# Patient Record
Sex: Female | Born: 1975 | Race: Black or African American | Hispanic: No | Marital: Married | Smoking: Never smoker
Health system: Southern US, Community
[De-identification: ages and names within clinical notes are randomized; demographics above are authoritative.]

## PROBLEM LIST (undated history)

## (undated) DIAGNOSIS — C801 Malignant (primary) neoplasm, unspecified: Secondary | ICD-10-CM

## (undated) DIAGNOSIS — Z9289 Personal history of other medical treatment: Secondary | ICD-10-CM

## (undated) DIAGNOSIS — G8929 Other chronic pain: Secondary | ICD-10-CM

## (undated) DIAGNOSIS — K5090 Crohn's disease, unspecified, without complications: Secondary | ICD-10-CM

## (undated) DIAGNOSIS — E119 Type 2 diabetes mellitus without complications: Secondary | ICD-10-CM

## (undated) DIAGNOSIS — D649 Anemia, unspecified: Secondary | ICD-10-CM

## (undated) DIAGNOSIS — E785 Hyperlipidemia, unspecified: Secondary | ICD-10-CM

## (undated) DIAGNOSIS — E039 Hypothyroidism, unspecified: Secondary | ICD-10-CM

## (undated) DIAGNOSIS — L509 Urticaria, unspecified: Secondary | ICD-10-CM

## (undated) DIAGNOSIS — F419 Anxiety disorder, unspecified: Secondary | ICD-10-CM

## (undated) DIAGNOSIS — T8859XA Other complications of anesthesia, initial encounter: Secondary | ICD-10-CM

## (undated) DIAGNOSIS — D329 Benign neoplasm of meninges, unspecified: Secondary | ICD-10-CM

## (undated) DIAGNOSIS — R112 Nausea with vomiting, unspecified: Secondary | ICD-10-CM

## (undated) DIAGNOSIS — Z9889 Other specified postprocedural states: Secondary | ICD-10-CM

## (undated) DIAGNOSIS — T4145XA Adverse effect of unspecified anesthetic, initial encounter: Secondary | ICD-10-CM

## (undated) DIAGNOSIS — N289 Disorder of kidney and ureter, unspecified: Secondary | ICD-10-CM

## (undated) DIAGNOSIS — T148XXA Other injury of unspecified body region, initial encounter: Secondary | ICD-10-CM

## (undated) DIAGNOSIS — J45909 Unspecified asthma, uncomplicated: Secondary | ICD-10-CM

## (undated) DIAGNOSIS — F410 Panic disorder [episodic paroxysmal anxiety] without agoraphobia: Secondary | ICD-10-CM

## (undated) HISTORY — PX: BREAST SURGERY: SHX581

## (undated) HISTORY — DX: Urticaria, unspecified: L50.9

## (undated) HISTORY — PX: NEPHRECTOMY TRANSPLANTED ORGAN: SUR880

## (undated) HISTORY — PX: SLEEVE GASTROPLASTY: SHX1101

## (undated) HISTORY — DX: Unspecified asthma, uncomplicated: J45.909

## (undated) HISTORY — DX: Hyperlipidemia, unspecified: E78.5

---

## 1998-07-10 ENCOUNTER — Emergency Department (HOSPITAL_COMMUNITY): Admission: EM | Admit: 1998-07-10 | Discharge: 1998-07-10 | Payer: Self-pay | Admitting: Emergency Medicine

## 1998-11-25 ENCOUNTER — Inpatient Hospital Stay (HOSPITAL_COMMUNITY): Admission: EM | Admit: 1998-11-25 | Discharge: 1998-11-27 | Payer: Self-pay | Admitting: *Deleted

## 1998-11-25 ENCOUNTER — Encounter: Payer: Self-pay | Admitting: Nephrology

## 1998-11-27 ENCOUNTER — Encounter: Payer: Self-pay | Admitting: Nephrology

## 1998-12-14 ENCOUNTER — Emergency Department (HOSPITAL_COMMUNITY): Admission: EM | Admit: 1998-12-14 | Discharge: 1998-12-14 | Payer: Self-pay | Admitting: Emergency Medicine

## 1999-01-01 ENCOUNTER — Encounter: Payer: Self-pay | Admitting: Nephrology

## 1999-01-01 ENCOUNTER — Emergency Department (HOSPITAL_COMMUNITY): Admission: EM | Admit: 1999-01-01 | Discharge: 1999-01-01 | Payer: Self-pay | Admitting: Emergency Medicine

## 1999-01-03 ENCOUNTER — Encounter: Payer: Self-pay | Admitting: Nephrology

## 1999-01-03 ENCOUNTER — Inpatient Hospital Stay (HOSPITAL_COMMUNITY): Admission: EM | Admit: 1999-01-03 | Discharge: 1999-01-06 | Payer: Self-pay | Admitting: Emergency Medicine

## 1999-01-03 ENCOUNTER — Encounter: Payer: Self-pay | Admitting: Emergency Medicine

## 1999-05-21 ENCOUNTER — Emergency Department (HOSPITAL_COMMUNITY): Admission: EM | Admit: 1999-05-21 | Discharge: 1999-05-21 | Payer: Self-pay | Admitting: Emergency Medicine

## 1999-05-21 ENCOUNTER — Encounter: Payer: Self-pay | Admitting: Emergency Medicine

## 1999-05-25 ENCOUNTER — Encounter: Payer: Self-pay | Admitting: Emergency Medicine

## 1999-05-25 ENCOUNTER — Emergency Department (HOSPITAL_COMMUNITY): Admission: EM | Admit: 1999-05-25 | Discharge: 1999-05-25 | Payer: Self-pay | Admitting: Emergency Medicine

## 1999-11-08 ENCOUNTER — Encounter: Payer: Self-pay | Admitting: Nephrology

## 1999-11-08 ENCOUNTER — Encounter: Admission: RE | Admit: 1999-11-08 | Discharge: 1999-11-08 | Payer: Self-pay | Admitting: Nephrology

## 1999-11-24 ENCOUNTER — Other Ambulatory Visit: Admission: RE | Admit: 1999-11-24 | Discharge: 1999-11-24 | Payer: Self-pay | Admitting: Gynecology

## 1999-11-24 ENCOUNTER — Encounter (INDEPENDENT_AMBULATORY_CARE_PROVIDER_SITE_OTHER): Payer: Self-pay

## 1999-12-12 ENCOUNTER — Emergency Department (HOSPITAL_COMMUNITY): Admission: EM | Admit: 1999-12-12 | Discharge: 1999-12-12 | Payer: Self-pay | Admitting: Emergency Medicine

## 1999-12-12 ENCOUNTER — Encounter: Payer: Self-pay | Admitting: Emergency Medicine

## 1999-12-19 ENCOUNTER — Emergency Department (HOSPITAL_COMMUNITY): Admission: EM | Admit: 1999-12-19 | Discharge: 1999-12-20 | Payer: Self-pay | Admitting: Emergency Medicine

## 1999-12-25 DIAGNOSIS — C801 Malignant (primary) neoplasm, unspecified: Secondary | ICD-10-CM

## 1999-12-25 HISTORY — DX: Malignant (primary) neoplasm, unspecified: C80.1

## 2000-01-19 ENCOUNTER — Ambulatory Visit (HOSPITAL_COMMUNITY): Admission: RE | Admit: 2000-01-19 | Discharge: 2000-01-19 | Payer: Self-pay | Admitting: Gynecology

## 2000-01-28 ENCOUNTER — Encounter (INDEPENDENT_AMBULATORY_CARE_PROVIDER_SITE_OTHER): Payer: Self-pay

## 2000-01-28 ENCOUNTER — Inpatient Hospital Stay (HOSPITAL_COMMUNITY): Admission: RE | Admit: 2000-01-28 | Discharge: 2000-01-30 | Payer: Self-pay | Admitting: Gynecology

## 2000-02-19 HISTORY — PX: TUBAL LIGATION: SHX77

## 2000-03-20 ENCOUNTER — Emergency Department (HOSPITAL_COMMUNITY): Admission: EM | Admit: 2000-03-20 | Discharge: 2000-03-20 | Payer: Self-pay | Admitting: Emergency Medicine

## 2000-03-21 ENCOUNTER — Encounter: Payer: Self-pay | Admitting: Nephrology

## 2000-03-21 ENCOUNTER — Encounter: Admission: RE | Admit: 2000-03-21 | Discharge: 2000-03-21 | Payer: Self-pay | Admitting: Nephrology

## 2000-09-23 ENCOUNTER — Encounter: Payer: Self-pay | Admitting: Nephrology

## 2000-09-23 ENCOUNTER — Encounter: Admission: RE | Admit: 2000-09-23 | Discharge: 2000-09-23 | Payer: Self-pay | Admitting: Nephrology

## 2000-10-07 ENCOUNTER — Emergency Department (HOSPITAL_COMMUNITY): Admission: EM | Admit: 2000-10-07 | Discharge: 2000-10-07 | Payer: Self-pay | Admitting: Emergency Medicine

## 2000-10-07 ENCOUNTER — Encounter: Payer: Self-pay | Admitting: Emergency Medicine

## 2000-11-27 ENCOUNTER — Ambulatory Visit (HOSPITAL_BASED_OUTPATIENT_CLINIC_OR_DEPARTMENT_OTHER): Admission: RE | Admit: 2000-11-27 | Discharge: 2000-11-27 | Payer: Self-pay | Admitting: Internal Medicine

## 2000-12-12 ENCOUNTER — Encounter: Admission: RE | Admit: 2000-12-12 | Discharge: 2001-03-12 | Payer: Self-pay | Admitting: Nephrology

## 2001-01-17 ENCOUNTER — Emergency Department (HOSPITAL_COMMUNITY): Admission: EM | Admit: 2001-01-17 | Discharge: 2001-01-17 | Payer: Self-pay | Admitting: Emergency Medicine

## 2001-01-17 ENCOUNTER — Encounter: Payer: Self-pay | Admitting: Emergency Medicine

## 2001-02-20 HISTORY — PX: PARATHYROIDECTOMY: SHX19

## 2001-05-04 ENCOUNTER — Emergency Department (HOSPITAL_COMMUNITY): Admission: EM | Admit: 2001-05-04 | Discharge: 2001-05-05 | Payer: Self-pay | Admitting: Emergency Medicine

## 2001-05-09 ENCOUNTER — Encounter: Admission: RE | Admit: 2001-05-09 | Discharge: 2001-08-07 | Payer: Self-pay | Admitting: Nephrology

## 2001-05-09 ENCOUNTER — Encounter: Admission: RE | Admit: 2001-05-09 | Discharge: 2001-05-09 | Payer: Self-pay | Admitting: *Deleted

## 2001-10-06 ENCOUNTER — Encounter: Payer: Self-pay | Admitting: Nephrology

## 2001-10-06 ENCOUNTER — Ambulatory Visit (HOSPITAL_COMMUNITY): Admission: RE | Admit: 2001-10-06 | Discharge: 2001-10-06 | Payer: Self-pay | Admitting: Nephrology

## 2001-10-30 ENCOUNTER — Encounter: Payer: Self-pay | Admitting: Emergency Medicine

## 2001-10-30 ENCOUNTER — Emergency Department (HOSPITAL_COMMUNITY): Admission: EM | Admit: 2001-10-30 | Discharge: 2001-10-30 | Payer: Self-pay | Admitting: Emergency Medicine

## 2002-01-01 ENCOUNTER — Encounter: Admission: RE | Admit: 2002-01-01 | Discharge: 2002-01-01 | Payer: Self-pay | Admitting: Nephrology

## 2002-01-01 ENCOUNTER — Encounter: Payer: Self-pay | Admitting: Nephrology

## 2002-03-23 ENCOUNTER — Encounter: Payer: Self-pay | Admitting: Nephrology

## 2002-03-23 ENCOUNTER — Encounter: Admission: RE | Admit: 2002-03-23 | Discharge: 2002-03-23 | Payer: Self-pay | Admitting: Nephrology

## 2002-03-24 HISTORY — PX: KNEE SURGERY: SHX244

## 2002-04-24 ENCOUNTER — Ambulatory Visit (HOSPITAL_COMMUNITY): Admission: RE | Admit: 2002-04-24 | Discharge: 2002-04-24 | Payer: Self-pay | Admitting: Internal Medicine

## 2002-05-13 ENCOUNTER — Encounter: Admission: RE | Admit: 2002-05-13 | Discharge: 2002-05-13 | Payer: Self-pay | Admitting: Nephrology

## 2002-05-13 ENCOUNTER — Encounter: Payer: Self-pay | Admitting: Nephrology

## 2002-05-18 ENCOUNTER — Encounter: Admission: RE | Admit: 2002-05-18 | Discharge: 2002-05-18 | Payer: Self-pay | Admitting: Nephrology

## 2002-05-18 ENCOUNTER — Encounter: Payer: Self-pay | Admitting: Nephrology

## 2002-06-11 ENCOUNTER — Emergency Department (HOSPITAL_COMMUNITY): Admission: EM | Admit: 2002-06-11 | Discharge: 2002-06-11 | Payer: Self-pay | Admitting: Emergency Medicine

## 2002-08-11 ENCOUNTER — Encounter: Payer: Self-pay | Admitting: Emergency Medicine

## 2002-08-11 ENCOUNTER — Emergency Department (HOSPITAL_COMMUNITY): Admission: EM | Admit: 2002-08-11 | Discharge: 2002-08-11 | Payer: Self-pay | Admitting: Emergency Medicine

## 2002-08-12 ENCOUNTER — Emergency Department (HOSPITAL_COMMUNITY): Admission: EM | Admit: 2002-08-12 | Discharge: 2002-08-12 | Payer: Self-pay | Admitting: Emergency Medicine

## 2002-11-04 ENCOUNTER — Encounter: Admission: RE | Admit: 2002-11-04 | Discharge: 2002-11-04 | Payer: Self-pay | Admitting: Internal Medicine

## 2002-11-04 ENCOUNTER — Encounter: Payer: Self-pay | Admitting: Internal Medicine

## 2002-11-12 ENCOUNTER — Other Ambulatory Visit: Admission: RE | Admit: 2002-11-12 | Discharge: 2002-11-12 | Payer: Self-pay | Admitting: Obstetrics and Gynecology

## 2003-04-20 ENCOUNTER — Emergency Department (HOSPITAL_COMMUNITY): Admission: EM | Admit: 2003-04-20 | Discharge: 2003-04-20 | Payer: Self-pay | Admitting: Emergency Medicine

## 2003-04-20 ENCOUNTER — Encounter: Payer: Self-pay | Admitting: Emergency Medicine

## 2003-10-22 ENCOUNTER — Inpatient Hospital Stay (HOSPITAL_COMMUNITY): Admission: RE | Admit: 2003-10-22 | Discharge: 2003-10-23 | Payer: Self-pay | Admitting: General Surgery

## 2003-10-22 ENCOUNTER — Encounter (INDEPENDENT_AMBULATORY_CARE_PROVIDER_SITE_OTHER): Payer: Self-pay | Admitting: Specialist

## 2004-01-21 ENCOUNTER — Ambulatory Visit (HOSPITAL_COMMUNITY): Admission: RE | Admit: 2004-01-21 | Discharge: 2004-01-21 | Payer: Self-pay | Admitting: Nephrology

## 2004-03-14 ENCOUNTER — Emergency Department (HOSPITAL_COMMUNITY): Admission: EM | Admit: 2004-03-14 | Discharge: 2004-03-15 | Payer: Self-pay | Admitting: Emergency Medicine

## 2004-03-14 ENCOUNTER — Ambulatory Visit (HOSPITAL_COMMUNITY): Admission: RE | Admit: 2004-03-14 | Discharge: 2004-03-14 | Payer: Self-pay | Admitting: Nephrology

## 2004-04-21 ENCOUNTER — Ambulatory Visit (HOSPITAL_COMMUNITY): Admission: RE | Admit: 2004-04-21 | Discharge: 2004-04-21 | Payer: Self-pay | Admitting: Nephrology

## 2004-05-04 ENCOUNTER — Inpatient Hospital Stay (HOSPITAL_COMMUNITY): Admission: AD | Admit: 2004-05-04 | Discharge: 2004-05-06 | Payer: Self-pay | Admitting: Nephrology

## 2004-08-25 ENCOUNTER — Observation Stay (HOSPITAL_COMMUNITY): Admission: AD | Admit: 2004-08-25 | Discharge: 2004-08-25 | Payer: Self-pay | Admitting: Surgery

## 2004-08-25 ENCOUNTER — Encounter (INDEPENDENT_AMBULATORY_CARE_PROVIDER_SITE_OTHER): Payer: Self-pay | Admitting: *Deleted

## 2004-09-19 ENCOUNTER — Ambulatory Visit (HOSPITAL_COMMUNITY): Admission: RE | Admit: 2004-09-19 | Discharge: 2004-09-19 | Payer: Self-pay | Admitting: Nephrology

## 2005-03-07 ENCOUNTER — Encounter: Admission: RE | Admit: 2005-03-07 | Discharge: 2005-03-07 | Payer: Self-pay | Admitting: Nephrology

## 2005-05-03 ENCOUNTER — Ambulatory Visit (HOSPITAL_COMMUNITY): Admission: RE | Admit: 2005-05-03 | Discharge: 2005-05-03 | Payer: Self-pay | Admitting: General Surgery

## 2005-06-06 ENCOUNTER — Ambulatory Visit (HOSPITAL_COMMUNITY): Admission: RE | Admit: 2005-06-06 | Discharge: 2005-06-06 | Payer: Self-pay | Admitting: General Surgery

## 2006-03-04 ENCOUNTER — Ambulatory Visit (HOSPITAL_COMMUNITY): Admission: RE | Admit: 2006-03-04 | Discharge: 2006-03-04 | Payer: Self-pay | Admitting: Nephrology

## 2006-03-14 ENCOUNTER — Emergency Department (HOSPITAL_COMMUNITY): Admission: EM | Admit: 2006-03-14 | Discharge: 2006-03-15 | Payer: Self-pay | Admitting: Emergency Medicine

## 2006-04-03 ENCOUNTER — Emergency Department (HOSPITAL_COMMUNITY): Admission: EM | Admit: 2006-04-03 | Discharge: 2006-04-03 | Payer: Self-pay | Admitting: Emergency Medicine

## 2006-04-05 ENCOUNTER — Ambulatory Visit: Payer: Self-pay | Admitting: Psychiatry

## 2006-04-05 ENCOUNTER — Other Ambulatory Visit (HOSPITAL_COMMUNITY): Admission: RE | Admit: 2006-04-05 | Discharge: 2006-04-10 | Payer: Self-pay | Admitting: Psychiatry

## 2006-05-01 ENCOUNTER — Ambulatory Visit (HOSPITAL_COMMUNITY): Payer: Self-pay | Admitting: Psychiatry

## 2006-05-15 ENCOUNTER — Ambulatory Visit (HOSPITAL_COMMUNITY): Payer: Self-pay | Admitting: Psychiatry

## 2006-09-23 ENCOUNTER — Encounter: Admission: RE | Admit: 2006-09-23 | Discharge: 2006-09-23 | Payer: Self-pay | Admitting: Nephrology

## 2006-11-02 ENCOUNTER — Emergency Department (HOSPITAL_COMMUNITY): Admission: EM | Admit: 2006-11-02 | Discharge: 2006-11-02 | Payer: Self-pay | Admitting: Emergency Medicine

## 2007-01-15 ENCOUNTER — Emergency Department (HOSPITAL_COMMUNITY): Admission: EM | Admit: 2007-01-15 | Discharge: 2007-01-16 | Payer: Self-pay | Admitting: Emergency Medicine

## 2007-04-02 ENCOUNTER — Emergency Department (HOSPITAL_COMMUNITY): Admission: EM | Admit: 2007-04-02 | Discharge: 2007-04-02 | Payer: Self-pay | Admitting: Emergency Medicine

## 2007-04-30 ENCOUNTER — Encounter: Admission: RE | Admit: 2007-04-30 | Discharge: 2007-04-30 | Payer: Self-pay | Admitting: Gastroenterology

## 2007-08-14 ENCOUNTER — Other Ambulatory Visit: Admission: RE | Admit: 2007-08-14 | Discharge: 2007-08-14 | Payer: Self-pay | Admitting: Gynecology

## 2007-11-17 ENCOUNTER — Ambulatory Visit (HOSPITAL_COMMUNITY): Admission: RE | Admit: 2007-11-17 | Discharge: 2007-11-17 | Payer: Self-pay | Admitting: Nephrology

## 2007-11-17 ENCOUNTER — Ambulatory Visit: Payer: Self-pay | Admitting: *Deleted

## 2007-11-17 ENCOUNTER — Encounter (INDEPENDENT_AMBULATORY_CARE_PROVIDER_SITE_OTHER): Payer: Self-pay | Admitting: Nephrology

## 2007-12-06 ENCOUNTER — Emergency Department (HOSPITAL_COMMUNITY): Admission: EM | Admit: 2007-12-06 | Discharge: 2007-12-06 | Payer: Self-pay | Admitting: Emergency Medicine

## 2007-12-15 ENCOUNTER — Emergency Department (HOSPITAL_COMMUNITY): Admission: EM | Admit: 2007-12-15 | Discharge: 2007-12-15 | Payer: Self-pay | Admitting: Emergency Medicine

## 2007-12-23 ENCOUNTER — Inpatient Hospital Stay (HOSPITAL_COMMUNITY): Admission: EM | Admit: 2007-12-23 | Discharge: 2007-12-27 | Payer: Self-pay | Admitting: Emergency Medicine

## 2008-02-04 ENCOUNTER — Emergency Department (HOSPITAL_COMMUNITY): Admission: EM | Admit: 2008-02-04 | Discharge: 2008-02-04 | Payer: Self-pay | Admitting: Emergency Medicine

## 2008-07-27 ENCOUNTER — Other Ambulatory Visit: Payer: Self-pay | Admitting: Emergency Medicine

## 2008-07-27 ENCOUNTER — Inpatient Hospital Stay (HOSPITAL_COMMUNITY): Admission: AD | Admit: 2008-07-27 | Discharge: 2008-07-30 | Payer: Self-pay | Admitting: Nephrology

## 2008-07-29 ENCOUNTER — Encounter (INDEPENDENT_AMBULATORY_CARE_PROVIDER_SITE_OTHER): Payer: Self-pay | Admitting: Nephrology

## 2008-07-30 ENCOUNTER — Ambulatory Visit: Payer: Self-pay | Admitting: Surgery

## 2008-08-05 ENCOUNTER — Inpatient Hospital Stay (HOSPITAL_COMMUNITY): Admission: EM | Admit: 2008-08-05 | Discharge: 2008-08-07 | Payer: Self-pay | Admitting: Emergency Medicine

## 2008-09-03 ENCOUNTER — Ambulatory Visit: Payer: Self-pay | Admitting: Vascular Surgery

## 2008-09-03 ENCOUNTER — Ambulatory Visit (HOSPITAL_COMMUNITY): Admission: RE | Admit: 2008-09-03 | Discharge: 2008-09-03 | Payer: Self-pay | Admitting: Surgery

## 2008-09-14 ENCOUNTER — Ambulatory Visit: Payer: Self-pay | Admitting: Vascular Surgery

## 2008-09-14 ENCOUNTER — Ambulatory Visit (HOSPITAL_COMMUNITY): Admission: RE | Admit: 2008-09-14 | Discharge: 2008-09-15 | Payer: Self-pay | Admitting: Vascular Surgery

## 2008-09-28 ENCOUNTER — Inpatient Hospital Stay (HOSPITAL_COMMUNITY): Admission: EM | Admit: 2008-09-28 | Discharge: 2008-10-01 | Payer: Self-pay | Admitting: Emergency Medicine

## 2008-10-15 ENCOUNTER — Ambulatory Visit: Payer: Self-pay | Admitting: Vascular Surgery

## 2008-11-04 ENCOUNTER — Encounter (HOSPITAL_COMMUNITY): Admission: RE | Admit: 2008-11-04 | Discharge: 2008-12-23 | Payer: Self-pay | Admitting: Nephrology

## 2008-12-30 ENCOUNTER — Encounter (HOSPITAL_COMMUNITY): Admission: RE | Admit: 2008-12-30 | Discharge: 2009-03-30 | Payer: Self-pay | Admitting: Nephrology

## 2009-04-01 ENCOUNTER — Emergency Department (HOSPITAL_COMMUNITY): Admission: EM | Admit: 2009-04-01 | Discharge: 2009-04-01 | Payer: Self-pay | Admitting: Emergency Medicine

## 2009-04-15 ENCOUNTER — Ambulatory Visit: Payer: Self-pay | Admitting: Vascular Surgery

## 2009-06-11 ENCOUNTER — Encounter (INDEPENDENT_AMBULATORY_CARE_PROVIDER_SITE_OTHER): Payer: Self-pay | Admitting: Internal Medicine

## 2009-06-12 ENCOUNTER — Ambulatory Visit: Payer: Self-pay | Admitting: *Deleted

## 2009-06-12 ENCOUNTER — Encounter (INDEPENDENT_AMBULATORY_CARE_PROVIDER_SITE_OTHER): Payer: Self-pay | Admitting: Internal Medicine

## 2009-06-15 ENCOUNTER — Inpatient Hospital Stay (HOSPITAL_COMMUNITY): Admission: EM | Admit: 2009-06-15 | Discharge: 2009-06-17 | Payer: Self-pay | Admitting: Emergency Medicine

## 2009-06-21 ENCOUNTER — Encounter: Payer: Self-pay | Admitting: Emergency Medicine

## 2009-06-21 ENCOUNTER — Inpatient Hospital Stay (HOSPITAL_COMMUNITY): Admission: RE | Admit: 2009-06-21 | Discharge: 2009-06-21 | Payer: Self-pay | Admitting: Internal Medicine

## 2009-08-25 ENCOUNTER — Emergency Department (HOSPITAL_COMMUNITY): Admission: EM | Admit: 2009-08-25 | Discharge: 2009-08-25 | Payer: Self-pay | Admitting: Emergency Medicine

## 2009-09-21 ENCOUNTER — Emergency Department (HOSPITAL_COMMUNITY): Admission: EM | Admit: 2009-09-21 | Discharge: 2009-09-22 | Payer: Self-pay | Admitting: Emergency Medicine

## 2009-09-23 ENCOUNTER — Ambulatory Visit: Payer: Self-pay | Admitting: Vascular Surgery

## 2009-09-29 ENCOUNTER — Inpatient Hospital Stay: Payer: Self-pay | Admitting: Vascular Surgery

## 2009-10-13 ENCOUNTER — Emergency Department (HOSPITAL_COMMUNITY): Admission: EM | Admit: 2009-10-13 | Discharge: 2009-10-14 | Payer: Self-pay | Admitting: Cardiovascular Disease

## 2009-10-21 ENCOUNTER — Ambulatory Visit: Payer: Self-pay | Admitting: Vascular Surgery

## 2009-10-29 ENCOUNTER — Emergency Department (HOSPITAL_COMMUNITY): Admission: EM | Admit: 2009-10-29 | Discharge: 2009-10-29 | Payer: Self-pay | Admitting: Emergency Medicine

## 2009-12-24 HISTORY — PX: OTHER SURGICAL HISTORY: SHX169

## 2009-12-31 ENCOUNTER — Emergency Department (HOSPITAL_COMMUNITY): Admission: EM | Admit: 2009-12-31 | Discharge: 2009-12-31 | Payer: Self-pay | Admitting: Emergency Medicine

## 2010-01-29 ENCOUNTER — Emergency Department (HOSPITAL_COMMUNITY)
Admission: EM | Admit: 2010-01-29 | Discharge: 2010-01-29 | Payer: Self-pay | Source: Home / Self Care | Admitting: Emergency Medicine

## 2010-02-22 ENCOUNTER — Inpatient Hospital Stay (HOSPITAL_COMMUNITY): Admission: EM | Admit: 2010-02-22 | Discharge: 2010-02-28 | Payer: Self-pay | Admitting: Emergency Medicine

## 2010-02-24 ENCOUNTER — Ambulatory Visit: Payer: Self-pay | Admitting: Internal Medicine

## 2010-05-02 ENCOUNTER — Inpatient Hospital Stay (HOSPITAL_COMMUNITY): Admission: EM | Admit: 2010-05-02 | Discharge: 2010-05-04 | Payer: Self-pay | Admitting: Emergency Medicine

## 2010-06-14 ENCOUNTER — Inpatient Hospital Stay (HOSPITAL_COMMUNITY)
Admission: EM | Admit: 2010-06-14 | Discharge: 2010-06-17 | Payer: Self-pay | Source: Home / Self Care | Admitting: Emergency Medicine

## 2010-06-17 ENCOUNTER — Ambulatory Visit: Payer: Self-pay | Admitting: Internal Medicine

## 2010-07-01 IMAGING — CR DG HAND COMPLETE 3+V*R*
3 series · 3 of 3 positions shown · non-contrast
Comparison: None.

CLINICAL DATA: Pain

RIGHT HAND - COMPLETE 3+ VIEW

[x hand pa right]
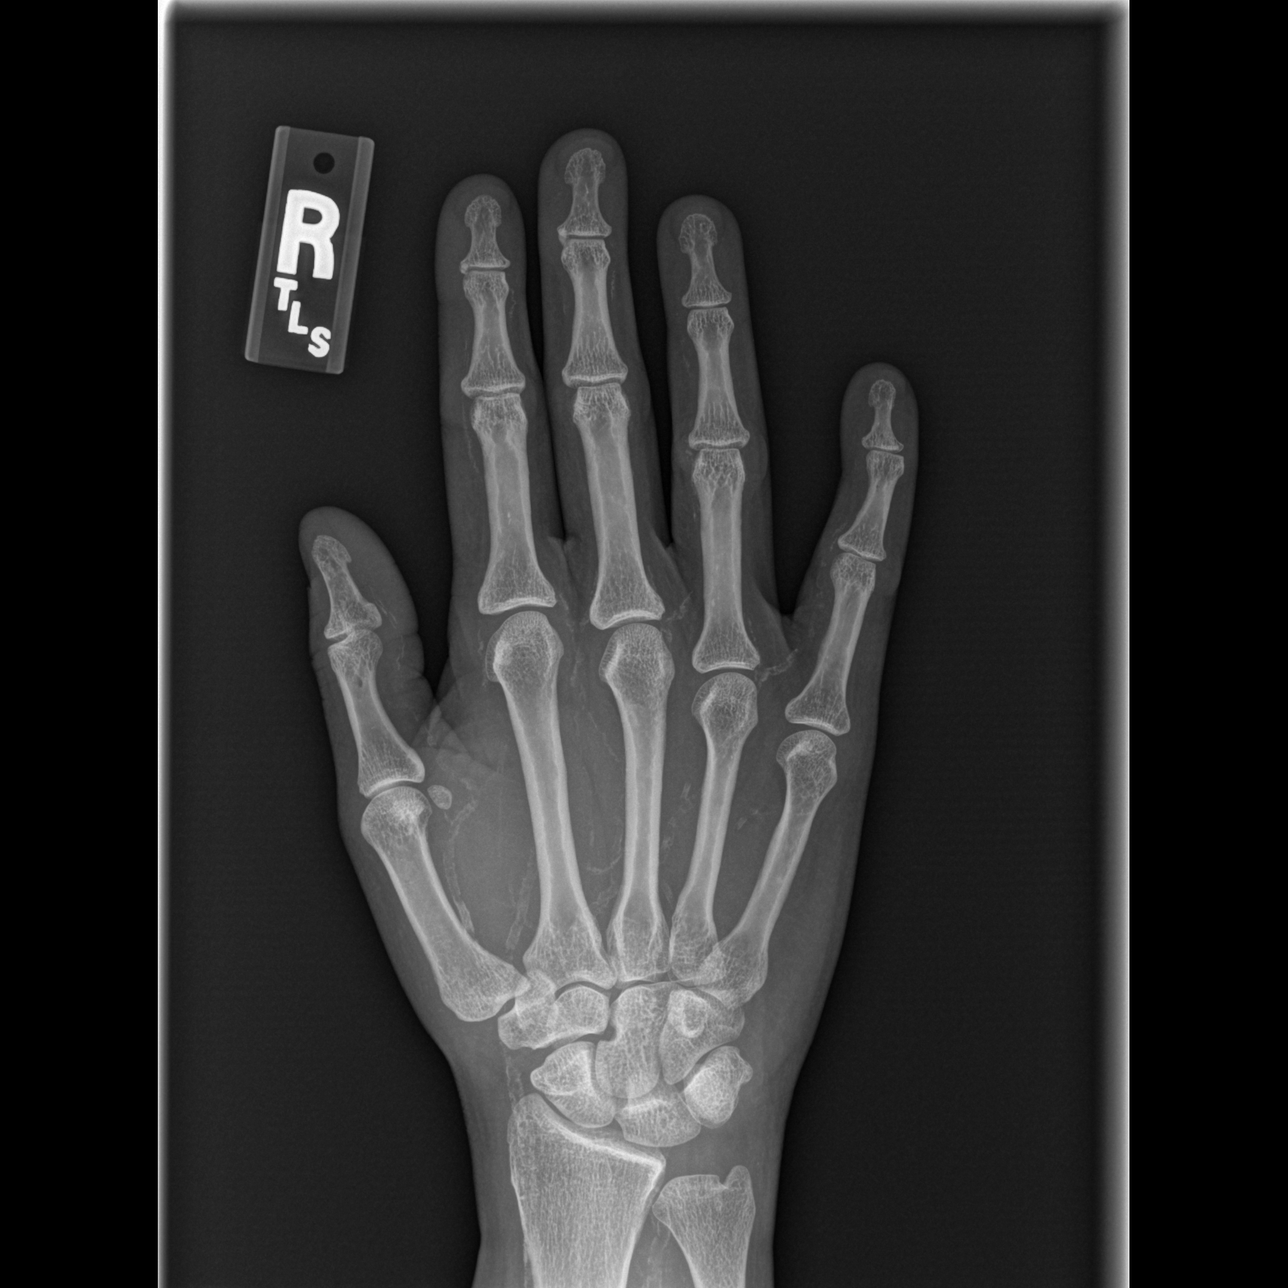

[x hand oblique right]
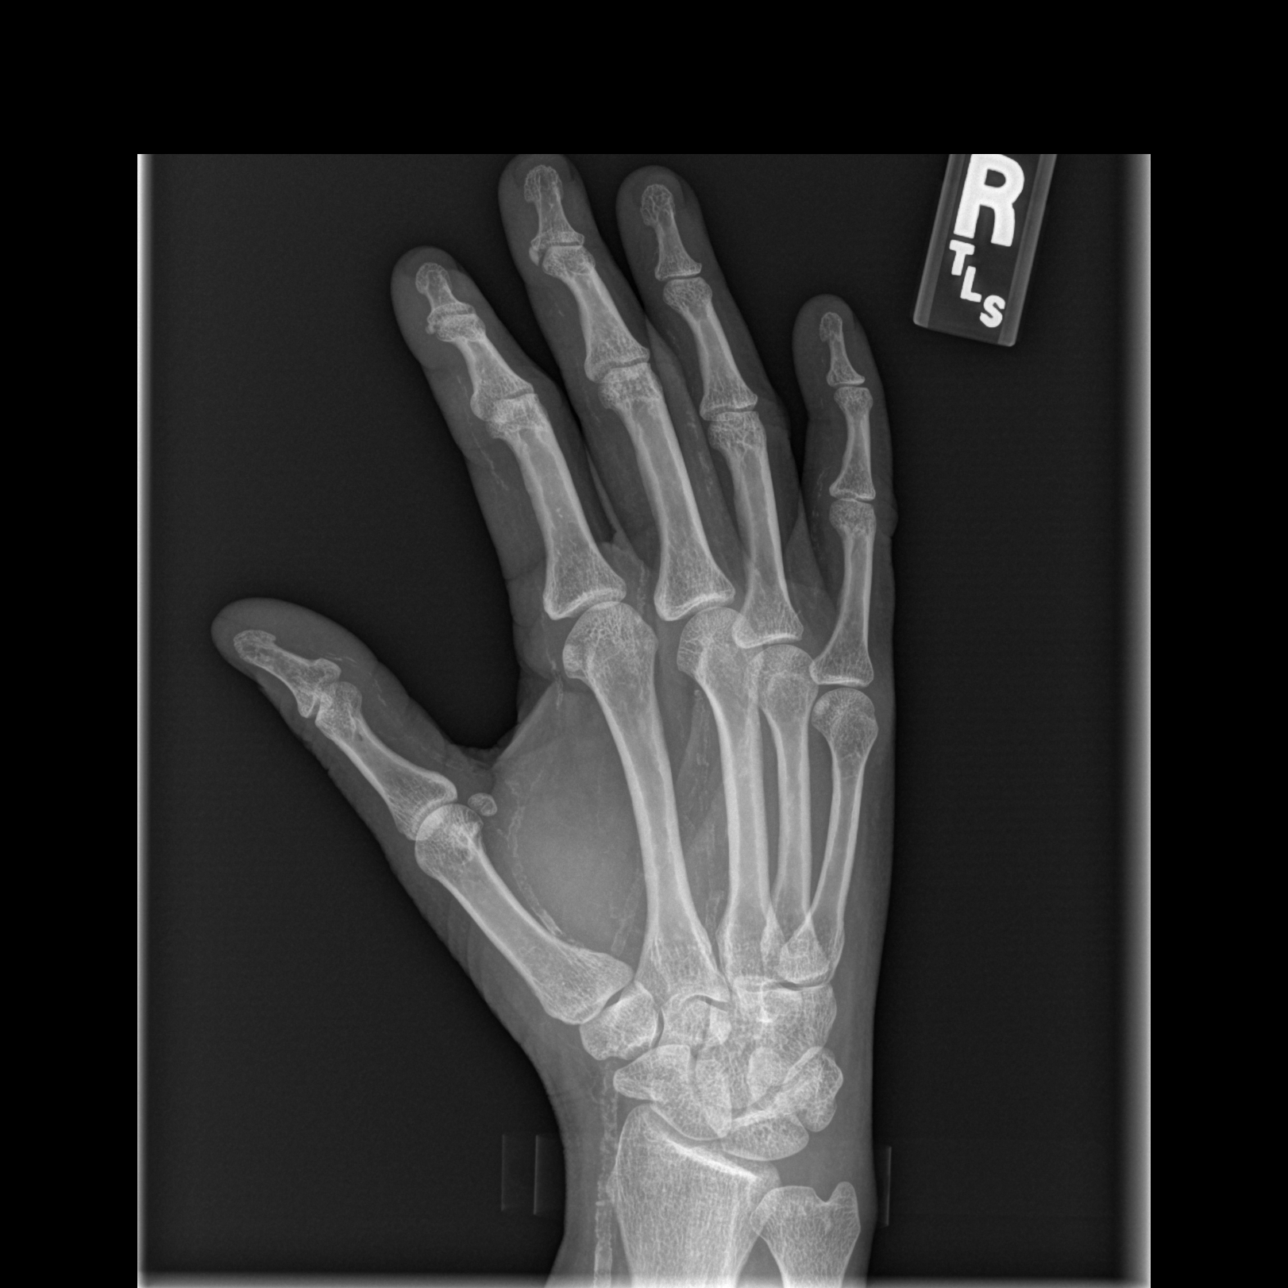

[x hand lat right]
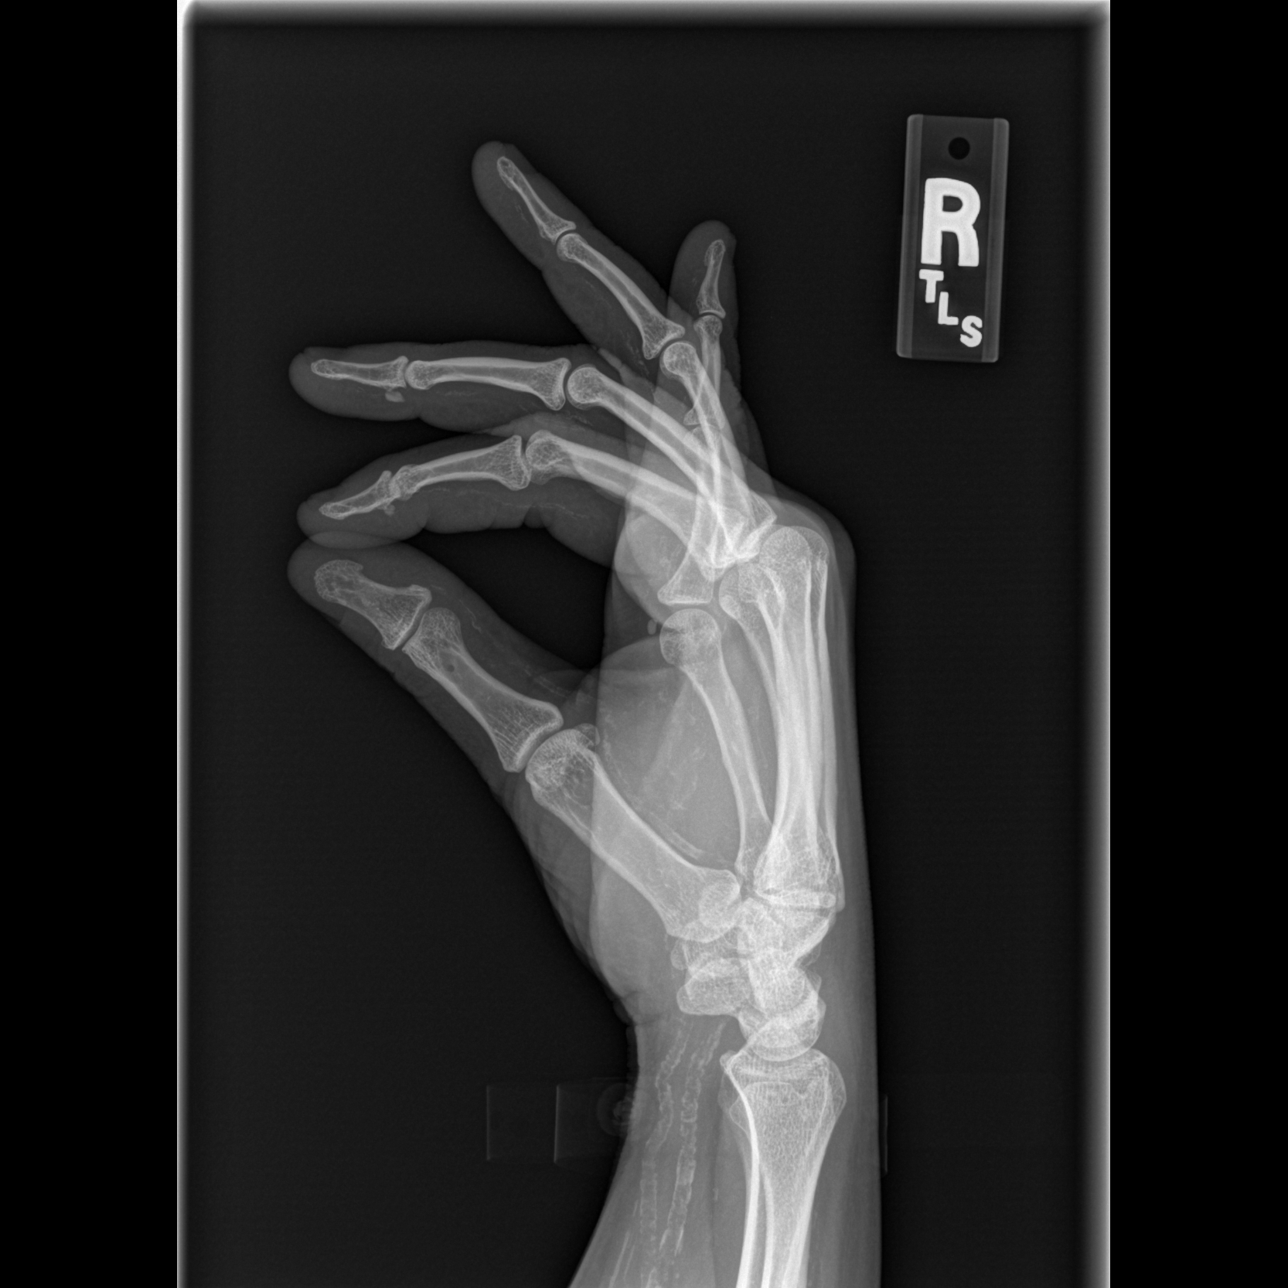

[3 of 3 positions shown; findings below may reference images not displayed]

FINDINGS: No acute fracture.  No dislocation.  Vascular
calcifications.
IMPRESSION: No acute bony pathology.

## 2010-07-05 ENCOUNTER — Ambulatory Visit: Payer: Self-pay | Admitting: Oncology

## 2010-07-06 ENCOUNTER — Inpatient Hospital Stay (HOSPITAL_COMMUNITY): Admission: EM | Admit: 2010-07-06 | Discharge: 2010-07-17 | Payer: Self-pay | Admitting: Internal Medicine

## 2010-07-07 ENCOUNTER — Ambulatory Visit: Payer: Self-pay | Admitting: Oncology

## 2010-07-28 ENCOUNTER — Encounter (INDEPENDENT_AMBULATORY_CARE_PROVIDER_SITE_OTHER): Payer: Self-pay | Admitting: Internal Medicine

## 2010-08-15 ENCOUNTER — Ambulatory Visit (HOSPITAL_COMMUNITY): Admission: RE | Admit: 2010-08-15 | Discharge: 2010-08-15 | Payer: Self-pay | Admitting: Interventional Radiology

## 2010-09-21 ENCOUNTER — Encounter (HOSPITAL_COMMUNITY): Admission: RE | Admit: 2010-09-21 | Discharge: 2010-09-22 | Payer: Self-pay | Admitting: Family Medicine

## 2010-09-25 ENCOUNTER — Encounter (HOSPITAL_COMMUNITY)
Admission: RE | Admit: 2010-09-25 | Discharge: 2010-10-04 | Payer: Self-pay | Source: Home / Self Care | Admitting: Family Medicine

## 2010-10-21 IMAGING — CR DG CHEST 1V PORT
1 series · 1 of 1 positions shown · non-contrast
Comparison: 05/02/2010

CLINICAL DATA: Acute renal failure.

PORTABLE CHEST - 1 VIEW

[view not recorded]
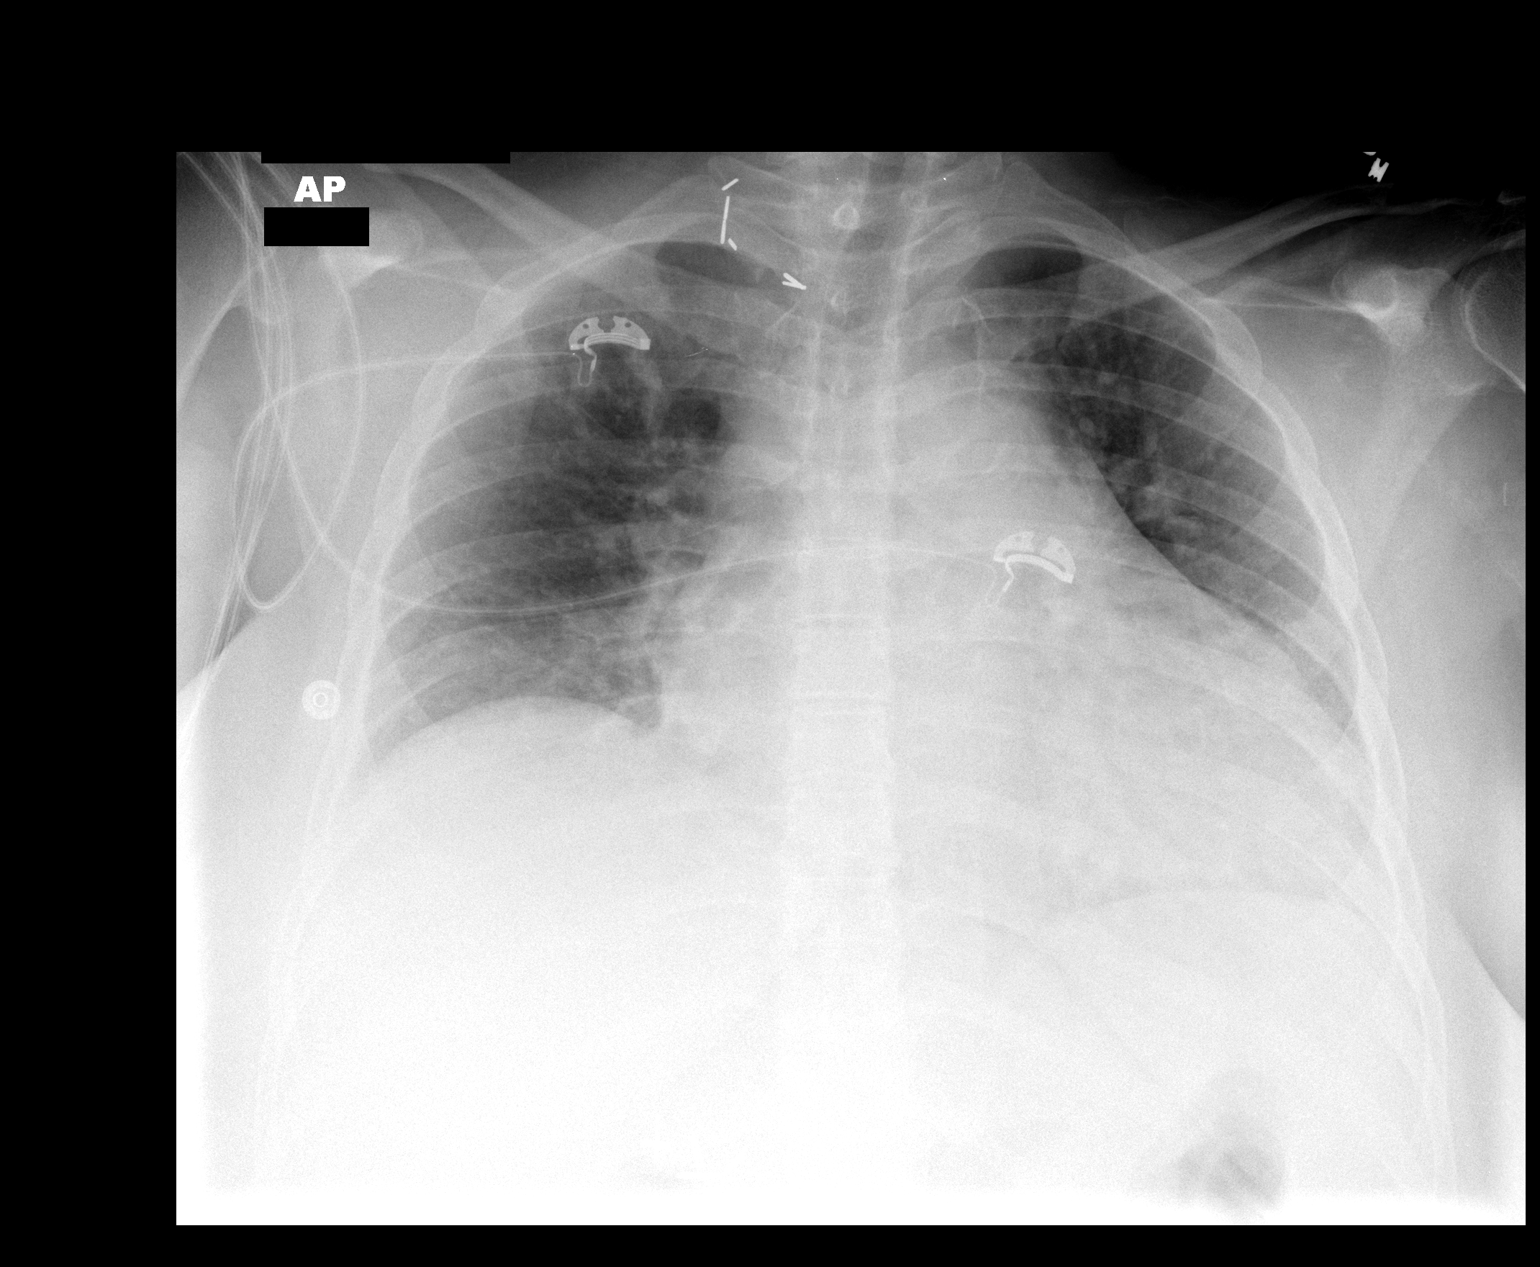

[1 of 1 positions shown; findings below may reference images not displayed]

FINDINGS: There is cardiomegaly with vascular congestion.  Low lung
volumes with minimal bibasilar atelectasis present.  No effusions
or acute bony abnormality.
IMPRESSION: Cardiomegaly with vascular congestion.  Low lung volumes.

## 2010-11-08 ENCOUNTER — Emergency Department (HOSPITAL_COMMUNITY): Admission: EM | Admit: 2010-11-08 | Discharge: 2010-11-09 | Payer: Self-pay | Admitting: Emergency Medicine

## 2010-11-13 ENCOUNTER — Emergency Department (HOSPITAL_COMMUNITY)
Admission: EM | Admit: 2010-11-13 | Discharge: 2010-11-13 | Payer: Self-pay | Source: Home / Self Care | Admitting: Emergency Medicine

## 2010-11-23 ENCOUNTER — Encounter: Admission: RE | Admit: 2010-11-23 | Discharge: 2010-11-23 | Payer: Self-pay | Admitting: Family Medicine

## 2010-11-30 ENCOUNTER — Observation Stay (HOSPITAL_COMMUNITY): Admission: EM | Admit: 2010-11-30 | Discharge: 2010-07-29 | Payer: Self-pay | Admitting: Emergency Medicine

## 2010-12-01 ENCOUNTER — Encounter
Admission: RE | Admit: 2010-12-01 | Discharge: 2010-12-01 | Payer: Self-pay | Source: Home / Self Care | Attending: Family Medicine | Admitting: Family Medicine

## 2010-12-02 ENCOUNTER — Emergency Department (HOSPITAL_COMMUNITY)
Admission: EM | Admit: 2010-12-02 | Discharge: 2010-12-02 | Payer: Self-pay | Source: Home / Self Care | Admitting: Emergency Medicine

## 2010-12-05 ENCOUNTER — Emergency Department (HOSPITAL_COMMUNITY)
Admission: EM | Admit: 2010-12-05 | Discharge: 2010-12-05 | Payer: Self-pay | Source: Home / Self Care | Admitting: Emergency Medicine

## 2010-12-11 ENCOUNTER — Encounter (HOSPITAL_COMMUNITY): Admission: RE | Admit: 2010-12-11 | Payer: Self-pay | Source: Home / Self Care | Admitting: Family Medicine

## 2010-12-19 ENCOUNTER — Encounter (HOSPITAL_COMMUNITY)
Admission: RE | Admit: 2010-12-19 | Discharge: 2011-01-18 | Payer: Self-pay | Source: Home / Self Care | Attending: Family Medicine | Admitting: Family Medicine

## 2010-12-29 ENCOUNTER — Inpatient Hospital Stay (HOSPITAL_COMMUNITY)
Admission: EM | Admit: 2010-12-29 | Discharge: 2011-01-01 | Disposition: A | Discharge status: Other Acute Inpatient Hospital | Payer: Self-pay | Source: Home / Self Care | Attending: Internal Medicine | Admitting: Internal Medicine

## 2011-01-01 ENCOUNTER — Inpatient Hospital Stay: Admission: EM | Admit: 2011-01-01 | Payer: Self-pay | Source: Home / Self Care | Admitting: Internal Medicine

## 2011-01-08 LAB — BASIC METABOLIC PANEL
BUN: 27 mg/dL — ABNORMAL HIGH (ref 6–23)
BUN: 29 mg/dL — ABNORMAL HIGH (ref 6–23)
CO2: 23 mEq/L (ref 19–32)
CO2: 23 mEq/L (ref 19–32)
Calcium: 8.3 mg/dL — ABNORMAL LOW (ref 8.4–10.5)
Calcium: 7.7 mg/dL — ABNORMAL LOW (ref 8.4–10.5)
Chloride: 103 mEq/L (ref 96–112)
Chloride: 102 mEq/L (ref 96–112)
Creatinine, Ser: 2.16 mg/dL — ABNORMAL HIGH (ref 0.4–1.2)
Creatinine, Ser: 2.29 mg/dL — ABNORMAL HIGH (ref 0.4–1.2)
GFR calc Af Amer: 31 mL/min — ABNORMAL LOW (ref >60)
GFR calc Af Amer: 29 mL/min — ABNORMAL LOW (ref >60)
GFR calc non Af Amer: 26 mL/min — ABNORMAL LOW (ref >60)
GFR calc non Af Amer: 24 mL/min — ABNORMAL LOW (ref >60)
Glucose, Bld: 125 mg/dL — ABNORMAL HIGH (ref 70–99)
Glucose, Bld: 165 mg/dL — ABNORMAL HIGH (ref 70–99)
Potassium: 3.6 mEq/L (ref 3.5–5.1)
Potassium: 3.6 mEq/L (ref 3.5–5.1)
Sodium: 139 mEq/L (ref 135–145)
Sodium: 135 mEq/L (ref 135–145)

## 2011-01-08 LAB — CBC
HCT: 36.5 % (ref 36.0–46.0)
HCT: 33.3 % — ABNORMAL LOW (ref 36.0–46.0)
HCT: 31 % — ABNORMAL LOW (ref 36.0–46.0)
HCT: 30.5 % — ABNORMAL LOW (ref 36.0–46.0)
Hemoglobin: 11.8 g/dL — ABNORMAL LOW (ref 12.0–15.0)
Hemoglobin: 10.6 g/dL — ABNORMAL LOW (ref 12.0–15.0)
Hemoglobin: 9.9 g/dL — ABNORMAL LOW (ref 12.0–15.0)
Hemoglobin: 9.7 g/dL — ABNORMAL LOW (ref 12.0–15.0)
MCH: 29.1 pg (ref 26.0–34.0)
MCH: 28.2 pg (ref 26.0–34.0)
MCH: 28.3 pg (ref 26.0–34.0)
MCH: 28.4 pg (ref 26.0–34.0)
MCHC: 32.3 g/dL (ref 30.0–36.0)
MCHC: 31.8 g/dL (ref 30.0–36.0)
MCHC: 31.9 g/dL (ref 30.0–36.0)
MCHC: 31.8 g/dL (ref 30.0–36.0)
MCV: 90.1 fL (ref 78.0–100.0)
MCV: 88.6 fL (ref 78.0–100.0)
MCV: 88.6 fL (ref 78.0–100.0)
MCV: 89.2 fL (ref 78.0–100.0)
Platelets: 229 10*3/uL (ref 150–400)
Platelets: 193 10*3/uL (ref 150–400)
Platelets: 35 10*3/uL — ABNORMAL LOW (ref 150–400)
Platelets: 219 10*3/uL (ref 150–400)
RBC: 4.05 MIL/uL (ref 3.87–5.11)
RBC: 3.76 MIL/uL — ABNORMAL LOW (ref 3.87–5.11)
RBC: 3.5 MIL/uL — ABNORMAL LOW (ref 3.87–5.11)
RBC: 3.42 MIL/uL — ABNORMAL LOW (ref 3.87–5.11)
RDW: 15.1 % (ref 11.5–15.5)
RDW: 15.1 % (ref 11.5–15.5)
RDW: 15.2 % (ref 11.5–15.5)
RDW: 15.1 % (ref 11.5–15.5)
WBC: 17 10*3/uL — ABNORMAL HIGH (ref 4.0–10.5)
WBC: 12.3 10*3/uL — ABNORMAL HIGH (ref 4.0–10.5)
WBC: 14.8 10*3/uL — ABNORMAL HIGH (ref 4.0–10.5)
WBC: 14.5 10*3/uL — ABNORMAL HIGH (ref 4.0–10.5)

## 2011-01-08 LAB — CARDIAC PANEL(CRET KIN+CKTOT+MB+TROPI)
CK, MB: 2.5 ng/mL (ref 0.3–4.0)
CK, MB: 2.8 ng/mL (ref 0.3–4.0)
CK, MB: 2.9 ng/mL (ref 0.3–4.0)
CK, MB: 2.5 ng/mL (ref 0.3–4.0)
Relative Index: 1 (ref 0.0–2.5)
Relative Index: 1.1 (ref 0.0–2.5)
Relative Index: 1.3 (ref 0.0–2.5)
Relative Index: 1.2 (ref 0.0–2.5)
Total CK: 249 U/L — ABNORMAL HIGH (ref 7–177)
Total CK: 264 U/L — ABNORMAL HIGH (ref 7–177)
Total CK: 221 U/L — ABNORMAL HIGH (ref 7–177)
Total CK: 204 U/L — ABNORMAL HIGH (ref 7–177)
Troponin I: 0.54 ng/mL (ref 0.00–0.06)
Troponin I: 0.44 ng/mL — ABNORMAL HIGH (ref 0.00–0.06)
Troponin I: 0.63 ng/mL (ref 0.00–0.06)
Troponin I: 0.46 ng/mL — ABNORMAL HIGH (ref 0.00–0.06)

## 2011-01-08 LAB — DIFFERENTIAL
Basophils Absolute: 0 10*3/uL (ref 0.0–0.1)
Basophils Absolute: 0 10*3/uL (ref 0.0–0.1)
Basophils Relative: 0 % (ref 0–1)
Basophils Relative: 0 % (ref 0–1)
Eosinophils Absolute: 0.1 10*3/uL (ref 0.0–0.7)
Eosinophils Absolute: 0.1 10*3/uL (ref 0.0–0.7)
Eosinophils Relative: 1 % (ref 0–5)
Eosinophils Relative: 1 % (ref 0–5)
Lymphocytes Relative: 2 % — ABNORMAL LOW (ref 12–46)
Lymphocytes Relative: 2 % — ABNORMAL LOW (ref 12–46)
Lymphs Abs: 0.4 10*3/uL — ABNORMAL LOW (ref 0.7–4.0)
Lymphs Abs: 0.3 10*3/uL — ABNORMAL LOW (ref 0.7–4.0)
Monocytes Absolute: 0.5 10*3/uL (ref 0.1–1.0)
Monocytes Absolute: 0.4 10*3/uL (ref 0.1–1.0)
Monocytes Relative: 3 % (ref 3–12)
Monocytes Relative: 3 % (ref 3–12)
Neutro Abs: 15.9 10*3/uL — ABNORMAL HIGH (ref 1.7–7.7)
Neutro Abs: 13.7 10*3/uL — ABNORMAL HIGH (ref 1.7–7.7)
Neutrophils Relative %: 94 % — ABNORMAL HIGH (ref 43–77)
Neutrophils Relative %: 95 % — ABNORMAL HIGH (ref 43–77)

## 2011-01-08 LAB — HEPATIC FUNCTION PANEL
ALT: <8 U/L (ref 0–35)
AST: 16 U/L (ref 0–37)
Albumin: 2.9 g/dL — ABNORMAL LOW (ref 3.5–5.2)
Alkaline Phosphatase: 63 U/L (ref 39–117)
Bilirubin, Direct: 0.1 mg/dL (ref 0.0–0.3)
Indirect Bilirubin: 0.6 mg/dL (ref 0.3–0.9)
Total Bilirubin: 0.7 mg/dL (ref 0.3–1.2)
Total Protein: 6.8 g/dL (ref 6.0–8.3)

## 2011-01-08 LAB — COMPREHENSIVE METABOLIC PANEL
ALT: 12 U/L (ref 0–35)
AST: 18 U/L (ref 0–37)
Albumin: 3.9 g/dL (ref 3.5–5.2)
Alkaline Phosphatase: 78 U/L (ref 39–117)
BUN: 18 mg/dL (ref 6–23)
CO2: 23 mEq/L (ref 19–32)
Calcium: 8.3 mg/dL — ABNORMAL LOW (ref 8.4–10.5)
Chloride: 103 mEq/L (ref 96–112)
Creatinine, Ser: 1.91 mg/dL — ABNORMAL HIGH (ref 0.4–1.2)
GFR calc Af Amer: 36 mL/min — ABNORMAL LOW (ref >60)
GFR calc non Af Amer: 30 mL/min — ABNORMAL LOW (ref >60)
Glucose, Bld: 132 mg/dL — ABNORMAL HIGH (ref 70–99)
Potassium: 4.2 mEq/L (ref 3.5–5.1)
Sodium: 136 mEq/L (ref 135–145)
Total Bilirubin: 0.6 mg/dL (ref 0.3–1.2)
Total Protein: 7.3 g/dL (ref 6.0–8.3)

## 2011-01-08 LAB — BLOOD GAS, ARTERIAL
Acid-base deficit: 3.8 mmol/L — ABNORMAL HIGH (ref 0.0–2.0)
Bicarbonate: 21.4 mEq/L (ref 20.0–24.0)
Drawn by: 249101
O2 Content: 2 L/min
O2 Saturation: 95.1 %
Patient temperature: 98.6
TCO2: 22.8 mmol/L (ref 0–100)
pCO2 arterial: 43.9 mmHg (ref 35.0–45.0)
pH, Arterial: 7.309 — ABNORMAL LOW (ref 7.350–7.400)
pO2, Arterial: 75.9 mmHg — ABNORMAL LOW (ref 80.0–100.0)

## 2011-01-08 LAB — CARBOXYHEMOGLOBIN
Carboxyhemoglobin: 1.5 % (ref 0.5–1.5)
Methemoglobin: 0.5 % (ref 0.0–1.5)
O2 Saturation: 81.6 %
Total hemoglobin: 11 g/dL — ABNORMAL LOW (ref 12.5–16.0)

## 2011-01-08 LAB — CULTURE, BLOOD (ROUTINE X 2)
Culture  Setup Time: 201201070044
Culture  Setup Time: 201201070044
Culture: NO GROWTH
Culture: NO GROWTH

## 2011-01-08 LAB — RENAL FUNCTION PANEL
Albumin: 3.5 g/dL (ref 3.5–5.2)
Albumin: 2.9 g/dL — ABNORMAL LOW (ref 3.5–5.2)
BUN: 20 mg/dL (ref 6–23)
BUN: 27 mg/dL — ABNORMAL HIGH (ref 6–23)
CO2: 22 mEq/L (ref 19–32)
CO2: 24 mEq/L (ref 19–32)
Calcium: 7.7 mg/dL — ABNORMAL LOW (ref 8.4–10.5)
Calcium: 7.7 mg/dL — ABNORMAL LOW (ref 8.4–10.5)
Chloride: 105 mEq/L (ref 96–112)
Chloride: 104 mEq/L (ref 96–112)
Creatinine, Ser: 2.03 mg/dL — ABNORMAL HIGH (ref 0.4–1.2)
Creatinine, Ser: 2.21 mg/dL — ABNORMAL HIGH (ref 0.4–1.2)
GFR calc Af Amer: 34 mL/min — ABNORMAL LOW (ref >60)
GFR calc Af Amer: 30 mL/min — ABNORMAL LOW (ref >60)
GFR calc non Af Amer: 28 mL/min — ABNORMAL LOW (ref >60)
GFR calc non Af Amer: 25 mL/min — ABNORMAL LOW (ref >60)
Glucose, Bld: 116 mg/dL — ABNORMAL HIGH (ref 70–99)
Glucose, Bld: 212 mg/dL — ABNORMAL HIGH (ref 70–99)
Phosphorus: 4.1 mg/dL (ref 2.3–4.6)
Phosphorus: 4.1 mg/dL (ref 2.3–4.6)
Potassium: 4 mEq/L (ref 3.5–5.1)
Potassium: 4 mEq/L (ref 3.5–5.1)
Sodium: 138 mEq/L (ref 135–145)
Sodium: 136 mEq/L (ref 135–145)

## 2011-01-08 LAB — MRSA PCR SCREENING
MRSA by PCR: NEGATIVE
MRSA by PCR: NEGATIVE

## 2011-01-08 LAB — DIC (DISSEMINATED INTRAVASCULAR COAGULATION)PANEL
D-Dimer, Quant: 1.72 ug/mL-FEU — ABNORMAL HIGH (ref 0.00–0.48)
Fibrinogen: >800 mg/dL — ABNORMAL HIGH (ref 204–475)
INR: 1.26 (ref 0.00–1.49)
Platelets: 219 10*3/uL (ref 150–400)
Prothrombin Time: 16 seconds — ABNORMAL HIGH (ref 11.6–15.2)
Smear Review: NONE SEEN
aPTT: 46 seconds — ABNORMAL HIGH (ref 24–37)

## 2011-01-08 LAB — GLUCOSE, CAPILLARY
Glucose-Capillary: 105 mg/dL — ABNORMAL HIGH (ref 70–99)
Glucose-Capillary: 123 mg/dL — ABNORMAL HIGH (ref 70–99)
Glucose-Capillary: 124 mg/dL — ABNORMAL HIGH (ref 70–99)
Glucose-Capillary: 135 mg/dL — ABNORMAL HIGH (ref 70–99)
Glucose-Capillary: 150 mg/dL — ABNORMAL HIGH (ref 70–99)
Glucose-Capillary: 168 mg/dL — ABNORMAL HIGH (ref 70–99)
Glucose-Capillary: 198 mg/dL — ABNORMAL HIGH (ref 70–99)
Glucose-Capillary: 198 mg/dL — ABNORMAL HIGH (ref 70–99)
Glucose-Capillary: 208 mg/dL — ABNORMAL HIGH (ref 70–99)
Glucose-Capillary: 147 mg/dL — ABNORMAL HIGH (ref 70–99)
Glucose-Capillary: 152 mg/dL — ABNORMAL HIGH (ref 70–99)

## 2011-01-08 LAB — LACTIC ACID, PLASMA
Lactic Acid, Venous: 3 mmol/L — ABNORMAL HIGH (ref 0.5–2.2)
Lactic Acid, Venous: 0.4 mmol/L — ABNORMAL LOW (ref 0.5–2.2)

## 2011-01-08 LAB — PROCALCITONIN
Procalcitonin: 6.38 ng/mL
Procalcitonin: 7.29 ng/mL

## 2011-01-08 LAB — BRAIN NATRIURETIC PEPTIDE: Pro B Natriuretic peptide (BNP): 41 pg/mL (ref 0.0–100.0)

## 2011-01-14 ENCOUNTER — Encounter: Payer: Self-pay | Admitting: Oncology

## 2011-01-15 ENCOUNTER — Encounter: Payer: Self-pay | Admitting: Podiatry

## 2011-01-17 ENCOUNTER — Other Ambulatory Visit (HOSPITAL_COMMUNITY): Payer: Self-pay | Admitting: Gastroenterology

## 2011-01-17 DIAGNOSIS — K219 Gastro-esophageal reflux disease without esophagitis: Secondary | ICD-10-CM

## 2011-01-19 ENCOUNTER — Encounter (HOSPITAL_COMMUNITY)
Admission: RE | Admit: 2011-01-19 | Discharge: 2011-01-23 | Payer: Self-pay | Source: Home / Self Care | Attending: Family Medicine | Admitting: Family Medicine

## 2011-01-24 ENCOUNTER — Ambulatory Visit (HOSPITAL_COMMUNITY)
Admission: RE | Admit: 2011-01-24 | Discharge: 2011-01-24 | Disposition: A | Discharge status: Home / Self Care | Payer: BC Managed Care – PPO | Source: Ambulatory Visit | Attending: Family Medicine | Admitting: Family Medicine

## 2011-01-24 ENCOUNTER — Ambulatory Visit (HOSPITAL_COMMUNITY)
Admission: RE | Admit: 2011-01-24 | Discharge: 2011-01-24 | Disposition: A | Discharge status: Home / Self Care | Payer: BC Managed Care – PPO | Source: Ambulatory Visit | Attending: *Deleted | Admitting: *Deleted

## 2011-01-24 ENCOUNTER — Encounter: Payer: Self-pay | Admitting: Gastroenterology

## 2011-01-24 DIAGNOSIS — IMO0001 Reserved for inherently not codable concepts without codable children: Secondary | ICD-10-CM | POA: Insufficient documentation

## 2011-01-24 DIAGNOSIS — M6281 Muscle weakness (generalized): Secondary | ICD-10-CM | POA: Insufficient documentation

## 2011-01-24 DIAGNOSIS — E119 Type 2 diabetes mellitus without complications: Secondary | ICD-10-CM | POA: Insufficient documentation

## 2011-01-24 DIAGNOSIS — M545 Low back pain, unspecified: Secondary | ICD-10-CM | POA: Insufficient documentation

## 2011-01-24 DIAGNOSIS — M25569 Pain in unspecified knee: Secondary | ICD-10-CM | POA: Insufficient documentation

## 2011-01-26 ENCOUNTER — Ambulatory Visit (HOSPITAL_COMMUNITY): Payer: Self-pay

## 2011-01-26 ENCOUNTER — Other Ambulatory Visit (HOSPITAL_COMMUNITY): Payer: Self-pay

## 2011-01-26 ENCOUNTER — Ambulatory Visit (HOSPITAL_COMMUNITY)
Admission: RE | Admit: 2011-01-26 | Discharge: 2011-01-26 | Disposition: A | Discharge status: Home / Self Care | Payer: Medicare Other | Source: Ambulatory Visit | Attending: Family Medicine | Admitting: Family Medicine

## 2011-01-26 DIAGNOSIS — M6281 Muscle weakness (generalized): Secondary | ICD-10-CM | POA: Insufficient documentation

## 2011-01-26 DIAGNOSIS — R279 Unspecified lack of coordination: Secondary | ICD-10-CM | POA: Insufficient documentation

## 2011-01-26 DIAGNOSIS — IMO0001 Reserved for inherently not codable concepts without codable children: Secondary | ICD-10-CM | POA: Insufficient documentation

## 2011-01-29 ENCOUNTER — Ambulatory Visit (HOSPITAL_COMMUNITY): Payer: Self-pay | Admitting: Physical Therapy

## 2011-01-29 ENCOUNTER — Ambulatory Visit (HOSPITAL_COMMUNITY): Payer: Self-pay | Admitting: Specialist

## 2011-01-31 ENCOUNTER — Ambulatory Visit (HOSPITAL_COMMUNITY): Payer: Self-pay | Admitting: Physical Therapy

## 2011-01-31 ENCOUNTER — Ambulatory Visit (HOSPITAL_COMMUNITY): Payer: Self-pay | Admitting: Specialist

## 2011-02-02 ENCOUNTER — Ambulatory Visit (HOSPITAL_COMMUNITY): Payer: Self-pay | Admitting: Specialist

## 2011-02-02 ENCOUNTER — Ambulatory Visit (HOSPITAL_COMMUNITY): Payer: Self-pay | Admitting: Physical Therapy

## 2011-02-05 ENCOUNTER — Ambulatory Visit (HOSPITAL_COMMUNITY): Payer: Self-pay | Admitting: Occupational Therapy

## 2011-02-05 ENCOUNTER — Ambulatory Visit (HOSPITAL_COMMUNITY): Payer: Self-pay | Admitting: Physical Therapy

## 2011-02-07 ENCOUNTER — Ambulatory Visit (HOSPITAL_COMMUNITY): Payer: Self-pay | Admitting: Specialist

## 2011-02-07 ENCOUNTER — Ambulatory Visit (HOSPITAL_COMMUNITY): Payer: Self-pay

## 2011-02-09 ENCOUNTER — Ambulatory Visit (HOSPITAL_COMMUNITY): Payer: Self-pay | Admitting: Occupational Therapy

## 2011-02-09 ENCOUNTER — Ambulatory Visit (HOSPITAL_COMMUNITY): Payer: Self-pay

## 2011-02-15 ENCOUNTER — Encounter (HOSPITAL_BASED_OUTPATIENT_CLINIC_OR_DEPARTMENT_OTHER): Payer: BC Managed Care – PPO | Attending: Internal Medicine

## 2011-02-15 DIAGNOSIS — L97509 Non-pressure chronic ulcer of other part of unspecified foot with unspecified severity: Secondary | ICD-10-CM | POA: Insufficient documentation

## 2011-02-15 DIAGNOSIS — E1169 Type 2 diabetes mellitus with other specified complication: Secondary | ICD-10-CM | POA: Insufficient documentation

## 2011-02-15 DIAGNOSIS — E1159 Type 2 diabetes mellitus with other circulatory complications: Secondary | ICD-10-CM | POA: Insufficient documentation

## 2011-02-15 DIAGNOSIS — M109 Gout, unspecified: Secondary | ICD-10-CM | POA: Insufficient documentation

## 2011-02-15 DIAGNOSIS — L988 Other specified disorders of the skin and subcutaneous tissue: Secondary | ICD-10-CM | POA: Insufficient documentation

## 2011-02-15 DIAGNOSIS — K219 Gastro-esophageal reflux disease without esophagitis: Secondary | ICD-10-CM | POA: Insufficient documentation

## 2011-02-15 DIAGNOSIS — K5090 Crohn's disease, unspecified, without complications: Secondary | ICD-10-CM | POA: Insufficient documentation

## 2011-02-15 DIAGNOSIS — E785 Hyperlipidemia, unspecified: Secondary | ICD-10-CM | POA: Insufficient documentation

## 2011-02-15 DIAGNOSIS — N186 End stage renal disease: Secondary | ICD-10-CM | POA: Insufficient documentation

## 2011-02-15 DIAGNOSIS — I96 Gangrene, not elsewhere classified: Secondary | ICD-10-CM | POA: Insufficient documentation

## 2011-02-15 DIAGNOSIS — G589 Mononeuropathy, unspecified: Secondary | ICD-10-CM | POA: Insufficient documentation

## 2011-02-15 DIAGNOSIS — Z9489 Other transplanted organ and tissue status: Secondary | ICD-10-CM | POA: Insufficient documentation

## 2011-02-15 DIAGNOSIS — IMO0001 Reserved for inherently not codable concepts without codable children: Secondary | ICD-10-CM | POA: Insufficient documentation

## 2011-02-16 NOTE — Consult Note (Addendum)
[name], [name]              ACCOUNT NO.:  [id number]  MEDICAL RECORD NO.:  [id number]          PATIENT TYPE:  INP  LOCATION:  2313                         FACILITY:  [facility]  PHYSICIAN:  Mervyn Skeeters, MD DATE OF BIRTH:  09-22-1976  DATE OF CONSULTATION: DATE OF DISCHARGE:  01/01/2011                                CONSULTATION   REASON FOR CONSULTATION:  Chest pain and positive troponin.  HISTORY AND PHYSICAL:  Ms. [name] is a 35 year old female with past medical history of end-stage renal disease secondary to chickenpox at the age of 59, who is status post a renal transplant on chronic immunosuppressive therapy, diabetes, hypertension, hyperlipidemia, anemia, and fibromyalgia, who was admitted with evidence of pneumonia. The patient had a 24-hour history of increasing fatigue and myalgias with fevers per her up to 105.  She denied shortness of breath or cough, but noted tightness in her chest.  The patient was found to have evidence of bilateral infiltrates on exam and was subsequently admitted for antibiotic therapy.  Of note, the patient had been on several pain medications and psychiatric meds for OCD and anxiety, these meds have been reduced on admission secondary to increased somnolence.  She apparently required multiple doses of Narcan.  At approximately 9:30 p.m., the patient developed a sharp stabbing chest pain with symptoms radiating into her arms down to her fingertips.  Pain also radiated up to her throat.  It was an 8-10, not relieved immediately by sublingual nitroglycerin or a GI cocktail.  Of note, the patient reports that she had the symptoms early in a day but thought that it was secondary to not having her pain meds and her history of fibromyalgia.  She has never had chest pain and she has no change with inspiration.  She does endorse feeling sweaty and some shortness of breath.  She had an EKG performed that originally was concerning for T- wave  inversions in the inferior lead, however, repeating the EKG without major respiratory variants showed that the patient only had biphasic T- waves in lead III.  She did have biomarkers checked with a troponin that came back positive at 0.54.  Of note, the patient tells me that she is not supposed to take aspirin with her Myfortic.  The patient actually had resolution of her pain with benzos and on second evaluation, was resting comfortably.  She did have a repeat biomarkers with a troponin that was trending down.  Of note, her creatinine was 2.29 on January 01, 2011.  PAST MEDICAL HISTORY:  Type 2 diabetes, anemia requiring transfusion, anxiety disorder, Crohn disease, peripheral neuropathy, fibromyalgia, gout with hyperuricemia, reflux disease, morbid obesity, end-stage renal disease secondary to chickenpox as a child status post renal transplant, history of hyperthyroidism status post parathyroidectomy, hypocalcemia, history of aplastic anemia secondary to CellCept, hyperlipidemia, history of renal cell carcinoma, status post left nephrectomy in 2005.  FAMILY HISTORY:  The patient's mom is alive and well, has diabetes, hypertension, cholesterol, and coronary artery disease.  Father has hypertension.  Sister with PCOS.  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  ALLERGIES:  VANCOMYCIN, IV DYE IODINE, DILAUDID, LATEX, NSAID.  CURRENT MEDICATIONS:  Please copy from home list.  REVIEW OF SYSTEMS:  Positive for fevers, chills, pain all over, new chest pain, cough, tightness in her chest, chronic diarrhea secondary to history of Crohn disease.  Otherwise, her review of systems is unremarkable.  PHYSICAL EXAMINATION:  VITAL SIGNS:  Blood pressure is 115/51, heart rate was 71, temperature was 99.3, satting 99% on face mask, respiratory rate was 16.  CBG is 7 cm of water. CARDIOVASCULAR:  Regular rate and rhythm.  No murmurs, rubs,  or gallops. LUNGS:  Crackles up to the mid back bilaterally. ABDOMEN:  Obese, nontender, nondistended.  Positive bowel sounds. EXTREMITIES:  Lower extremities, 2+ DP, PT pulses.  Strength is 5/5. NEUROLOGIC: Alert and oriented x3, sleepy.  Cranial nerves II-XII are intact.  BIOMARKERS:  Last set at 6:20 a.m.  Creatinine is 2.221.  CK-MB is 2.9, and troponin I is 0.63 which is up from 0.44, her first set was 0.54. EKG shows only biphasic T-waves in lead III.  ASSESSMENT AND PLAN:  This is a very complicated 35 year old female who has a history of end-stage renal disease status post renal transplant with acute on chronic renal insufficiency with a creatinine of about 2.3, hypertension, hyperlipidemia, diabetes, and fibromyalgia, who is here with bilateral pneumonia and fevers.  She unfortunately had a troponin checked in the setting of chest pain and has biomarkers that has been variable, but her last set is trending up.  It is certainly possible that she could have ischemia with her end-stage renal disease and multiple cardiovascular risk factors.  We are going to monitor the patient and start heparin protocol.  We have discussed aspirin therapy with her and she has refused it so far.  Our official recommendations is that she be placed on aspirin, start on a beta-blocker, and heparin drip started according to the ACS protocol.  We also recommended that she has her lipids checked as well as a hemoglobin A1c given her renal insufficiency.  We are going to avoid trying to take her to the cath lab.  I would first consider stress testing in the setting to see if the patient has ischemia.  She apparently had a CT scan at [facility] as an outpatient on December 28, 2010.  I would get these records to follow up to make sure that she could have had a PE protocol CT.  I will just make sure that she has been ruled out for that.  Our team will continue to follow with you.     Mervyn Skeeters,  MD     MLA/MEDQ  D:  01/01/2011  T:  01/01/2011  Job:  [id number]  Electronically Signed by Colen Darling MD on 02/16/2011 11:29:25 AM

## 2011-02-22 ENCOUNTER — Encounter (HOSPITAL_BASED_OUTPATIENT_CLINIC_OR_DEPARTMENT_OTHER): Payer: Medicare Other | Attending: Internal Medicine

## 2011-02-22 DIAGNOSIS — K219 Gastro-esophageal reflux disease without esophagitis: Secondary | ICD-10-CM | POA: Insufficient documentation

## 2011-02-22 DIAGNOSIS — E1169 Type 2 diabetes mellitus with other specified complication: Secondary | ICD-10-CM | POA: Insufficient documentation

## 2011-02-22 DIAGNOSIS — I96 Gangrene, not elsewhere classified: Secondary | ICD-10-CM | POA: Insufficient documentation

## 2011-02-22 DIAGNOSIS — L988 Other specified disorders of the skin and subcutaneous tissue: Secondary | ICD-10-CM | POA: Insufficient documentation

## 2011-02-22 DIAGNOSIS — E1159 Type 2 diabetes mellitus with other circulatory complications: Secondary | ICD-10-CM | POA: Insufficient documentation

## 2011-02-22 DIAGNOSIS — G589 Mononeuropathy, unspecified: Secondary | ICD-10-CM | POA: Insufficient documentation

## 2011-02-22 DIAGNOSIS — K5090 Crohn's disease, unspecified, without complications: Secondary | ICD-10-CM | POA: Insufficient documentation

## 2011-02-22 DIAGNOSIS — N186 End stage renal disease: Secondary | ICD-10-CM | POA: Insufficient documentation

## 2011-02-22 DIAGNOSIS — Z9489 Other transplanted organ and tissue status: Secondary | ICD-10-CM | POA: Insufficient documentation

## 2011-02-22 DIAGNOSIS — L97509 Non-pressure chronic ulcer of other part of unspecified foot with unspecified severity: Secondary | ICD-10-CM | POA: Insufficient documentation

## 2011-02-22 DIAGNOSIS — E785 Hyperlipidemia, unspecified: Secondary | ICD-10-CM | POA: Insufficient documentation

## 2011-02-22 DIAGNOSIS — IMO0001 Reserved for inherently not codable concepts without codable children: Secondary | ICD-10-CM | POA: Insufficient documentation

## 2011-02-22 DIAGNOSIS — M109 Gout, unspecified: Secondary | ICD-10-CM | POA: Insufficient documentation

## 2011-02-23 ENCOUNTER — Other Ambulatory Visit: Payer: Self-pay

## 2011-02-23 ENCOUNTER — Ambulatory Visit (HOSPITAL_COMMUNITY)
Admission: RE | Admit: 2011-02-23 | Discharge: 2011-02-23 | Disposition: A | Discharge status: Home / Self Care | Payer: Medicare Other | Source: Ambulatory Visit | Attending: Internal Medicine | Admitting: Internal Medicine

## 2011-02-23 ENCOUNTER — Other Ambulatory Visit (HOSPITAL_BASED_OUTPATIENT_CLINIC_OR_DEPARTMENT_OTHER): Payer: Self-pay | Admitting: Internal Medicine

## 2011-02-23 DIAGNOSIS — Z01812 Encounter for preprocedural laboratory examination: Secondary | ICD-10-CM | POA: Insufficient documentation

## 2011-02-23 DIAGNOSIS — I10 Essential (primary) hypertension: Secondary | ICD-10-CM | POA: Insufficient documentation

## 2011-02-23 DIAGNOSIS — R059 Cough, unspecified: Secondary | ICD-10-CM | POA: Insufficient documentation

## 2011-02-23 DIAGNOSIS — R05 Cough: Secondary | ICD-10-CM | POA: Insufficient documentation

## 2011-02-23 DIAGNOSIS — R0989 Other specified symptoms and signs involving the circulatory and respiratory systems: Secondary | ICD-10-CM

## 2011-02-23 DIAGNOSIS — I517 Cardiomegaly: Secondary | ICD-10-CM | POA: Insufficient documentation

## 2011-02-23 DIAGNOSIS — Z01818 Encounter for other preprocedural examination: Secondary | ICD-10-CM | POA: Insufficient documentation

## 2011-02-23 LAB — GLUCOSE, CAPILLARY: Glucose-Capillary: 225 mg/dL — ABNORMAL HIGH (ref 70–99)

## 2011-02-25 ENCOUNTER — Emergency Department (HOSPITAL_COMMUNITY)
Admission: EM | Admit: 2011-02-25 | Discharge: 2011-02-25 | Disposition: A | Discharge status: Home / Self Care | Payer: Medicare Other | Attending: Emergency Medicine | Admitting: Emergency Medicine

## 2011-02-25 DIAGNOSIS — R197 Diarrhea, unspecified: Secondary | ICD-10-CM | POA: Insufficient documentation

## 2011-02-25 DIAGNOSIS — R112 Nausea with vomiting, unspecified: Secondary | ICD-10-CM | POA: Insufficient documentation

## 2011-02-25 DIAGNOSIS — T25039A Burn of unspecified degree of unspecified toe(s) (nail), initial encounter: Secondary | ICD-10-CM | POA: Insufficient documentation

## 2011-02-25 DIAGNOSIS — IMO0002 Reserved for concepts with insufficient information to code with codable children: Secondary | ICD-10-CM | POA: Insufficient documentation

## 2011-02-26 LAB — GLUCOSE, CAPILLARY
Glucose-Capillary: 231 mg/dL — ABNORMAL HIGH (ref 70–99)
Glucose-Capillary: 183 mg/dL — ABNORMAL HIGH (ref 70–99)

## 2011-03-06 LAB — URINALYSIS, ROUTINE W REFLEX MICROSCOPIC
Bilirubin Urine: NEGATIVE
Glucose, UA: 250 mg/dL — AB
Ketones, ur: NEGATIVE mg/dL
Leukocytes, UA: NEGATIVE
Nitrite: NEGATIVE
Specific Gravity, Urine: >1.03 — ABNORMAL HIGH (ref 1.005–1.030)
Urobilinogen, UA: 0.2 mg/dL (ref 0.0–1.0)
pH: 6 (ref 5.0–8.0)

## 2011-03-06 LAB — BASIC METABOLIC PANEL
BUN: 21 mg/dL (ref 6–23)
BUN: 16 mg/dL (ref 6–23)
BUN: 19 mg/dL (ref 6–23)
CO2: 19 mEq/L (ref 19–32)
CO2: 20 mEq/L (ref 19–32)
CO2: 22 mEq/L (ref 19–32)
Calcium: 7.6 mg/dL — ABNORMAL LOW (ref 8.4–10.5)
Calcium: 7.8 mg/dL — ABNORMAL LOW (ref 8.4–10.5)
Calcium: 8.8 mg/dL (ref 8.4–10.5)
Chloride: 109 mEq/L (ref 96–112)
Chloride: 102 mEq/L (ref 96–112)
Chloride: 108 mEq/L (ref 96–112)
Creatinine, Ser: 1.48 mg/dL — ABNORMAL HIGH (ref 0.4–1.2)
Creatinine, Ser: 1.57 mg/dL — ABNORMAL HIGH (ref 0.4–1.2)
Creatinine, Ser: 1.89 mg/dL — ABNORMAL HIGH (ref 0.4–1.2)
GFR calc Af Amer: 48 mL/min — ABNORMAL LOW (ref >60)
GFR calc Af Amer: 36 mL/min — ABNORMAL LOW (ref >60)
GFR calc Af Amer: 45 mL/min — ABNORMAL LOW (ref >60)
GFR calc non Af Amer: 40 mL/min — ABNORMAL LOW (ref >60)
GFR calc non Af Amer: 30 mL/min — ABNORMAL LOW (ref >60)
GFR calc non Af Amer: 37 mL/min — ABNORMAL LOW (ref >60)
Glucose, Bld: 156 mg/dL — ABNORMAL HIGH (ref 70–99)
Glucose, Bld: 206 mg/dL — ABNORMAL HIGH (ref 70–99)
Glucose, Bld: 99 mg/dL (ref 70–99)
Potassium: 3.8 mEq/L (ref 3.5–5.1)
Potassium: 3.8 mEq/L (ref 3.5–5.1)
Potassium: 4.4 mEq/L (ref 3.5–5.1)
Sodium: 136 mEq/L (ref 135–145)
Sodium: 134 mEq/L — ABNORMAL LOW (ref 135–145)
Sodium: 138 mEq/L (ref 135–145)

## 2011-03-06 LAB — CBC
HCT: 33.4 % — ABNORMAL LOW (ref 36.0–46.0)
HCT: 25.9 % — ABNORMAL LOW (ref 36.0–46.0)
HCT: 34.4 % — ABNORMAL LOW (ref 36.0–46.0)
Hemoglobin: 10.7 g/dL — ABNORMAL LOW (ref 12.0–15.0)
Hemoglobin: 11.4 g/dL — ABNORMAL LOW (ref 12.0–15.0)
Hemoglobin: 8.5 g/dL — ABNORMAL LOW (ref 12.0–15.0)
MCH: 28.4 pg (ref 26.0–34.0)
MCH: 29 pg (ref 26.0–34.0)
MCH: 29.2 pg (ref 26.0–34.0)
MCHC: 32 g/dL (ref 30.0–36.0)
MCHC: 33 g/dL (ref 30.0–36.0)
MCHC: 33.1 g/dL (ref 30.0–36.0)
MCV: 88.6 fL (ref 78.0–100.0)
MCV: 88 fL (ref 78.0–100.0)
MCV: 88.3 fL (ref 78.0–100.0)
Platelets: 238 10*3/uL (ref 150–400)
Platelets: 173 10*3/uL (ref 150–400)
Platelets: 261 10*3/uL (ref 150–400)
RBC: 3.77 MIL/uL — ABNORMAL LOW (ref 3.87–5.11)
RBC: 2.94 MIL/uL — ABNORMAL LOW (ref 3.87–5.11)
RBC: 3.9 MIL/uL (ref 3.87–5.11)
RDW: 15.2 % (ref 11.5–15.5)
RDW: 15.3 % (ref 11.5–15.5)
RDW: 15.5 % (ref 11.5–15.5)
WBC: 11.6 10*3/uL — ABNORMAL HIGH (ref 4.0–10.5)
WBC: 7.5 10*3/uL (ref 4.0–10.5)
WBC: 9.7 10*3/uL (ref 4.0–10.5)

## 2011-03-06 LAB — DIFFERENTIAL
Basophils Absolute: 0 10*3/uL (ref 0.0–0.1)
Basophils Absolute: 0 10*3/uL (ref 0.0–0.1)
Basophils Absolute: 0 10*3/uL (ref 0.0–0.1)
Basophils Relative: 0 % (ref 0–1)
Basophils Relative: 0 % (ref 0–1)
Basophils Relative: 0 % (ref 0–1)
Eosinophils Absolute: 0.1 10*3/uL (ref 0.0–0.7)
Eosinophils Absolute: 0.1 10*3/uL (ref 0.0–0.7)
Eosinophils Absolute: 0.2 10*3/uL (ref 0.0–0.7)
Eosinophils Relative: 1 % (ref 0–5)
Eosinophils Relative: 2 % (ref 0–5)
Eosinophils Relative: 2 % (ref 0–5)
Lymphocytes Relative: 2 % — ABNORMAL LOW (ref 12–46)
Lymphocytes Relative: 3 % — ABNORMAL LOW (ref 12–46)
Lymphocytes Relative: 3 % — ABNORMAL LOW (ref 12–46)
Lymphs Abs: 0.3 10*3/uL — ABNORMAL LOW (ref 0.7–4.0)
Lymphs Abs: 0.2 10*3/uL — ABNORMAL LOW (ref 0.7–4.0)
Lymphs Abs: 0.3 10*3/uL — ABNORMAL LOW (ref 0.7–4.0)
Monocytes Absolute: 0.3 10*3/uL (ref 0.1–1.0)
Monocytes Absolute: 0.1 10*3/uL (ref 0.1–1.0)
Monocytes Absolute: 0.5 10*3/uL (ref 0.1–1.0)
Monocytes Relative: 3 % (ref 3–12)
Monocytes Relative: 1 % — ABNORMAL LOW (ref 3–12)
Monocytes Relative: 5 % (ref 3–12)
Neutro Abs: 10.9 10*3/uL — ABNORMAL HIGH (ref 1.7–7.7)
Neutro Abs: 7.1 10*3/uL (ref 1.7–7.7)
Neutro Abs: 8.7 10*3/uL — ABNORMAL HIGH (ref 1.7–7.7)
Neutrophils Relative %: 94 % — ABNORMAL HIGH (ref 43–77)
Neutrophils Relative %: 90 % — ABNORMAL HIGH (ref 43–77)
Neutrophils Relative %: 95 % — ABNORMAL HIGH (ref 43–77)

## 2011-03-06 LAB — CULTURE, BLOOD (ROUTINE X 2)
Culture  Setup Time: 201112132045
Culture: NO GROWTH

## 2011-03-06 LAB — URINE MICROSCOPIC-ADD ON

## 2011-03-06 LAB — POCT I-STAT, CHEM 8
BUN: 23 mg/dL (ref 6–23)
Calcium, Ion: 0.92 mmol/L — ABNORMAL LOW (ref 1.12–1.32)
Chloride: 110 mEq/L (ref 96–112)
Creatinine, Ser: 1.5 mg/dL — ABNORMAL HIGH (ref 0.4–1.2)
Glucose, Bld: 152 mg/dL — ABNORMAL HIGH (ref 70–99)
HCT: 34 % — ABNORMAL LOW (ref 36.0–46.0)
Hemoglobin: 11.6 g/dL — ABNORMAL LOW (ref 12.0–15.0)
Potassium: 3.6 mEq/L (ref 3.5–5.1)
Sodium: 140 mEq/L (ref 135–145)
TCO2: 19 mmol/L (ref 0–100)

## 2011-03-06 LAB — PROCALCITONIN: Procalcitonin: 0.62 ng/mL

## 2011-03-06 LAB — POCT PREGNANCY, URINE: Preg Test, Ur: NEGATIVE

## 2011-03-06 LAB — LACTIC ACID, PLASMA: Lactic Acid, Venous: 2.9 mmol/L — ABNORMAL HIGH (ref 0.5–2.2)

## 2011-03-09 LAB — URINALYSIS, ROUTINE W REFLEX MICROSCOPIC
Bilirubin Urine: NEGATIVE
Bilirubin Urine: NEGATIVE
Glucose, UA: NEGATIVE mg/dL
Glucose, UA: NEGATIVE mg/dL
Hgb urine dipstick: NEGATIVE
Hgb urine dipstick: NEGATIVE
Ketones, ur: NEGATIVE mg/dL
Ketones, ur: NEGATIVE mg/dL
Nitrite: NEGATIVE
Nitrite: NEGATIVE
Protein, ur: 30 mg/dL — AB
Protein, ur: 30 mg/dL — AB
Specific Gravity, Urine: 1.011 (ref 1.005–1.030)
Specific Gravity, Urine: 1.013 (ref 1.005–1.030)
Urobilinogen, UA: 0.2 mg/dL (ref 0.0–1.0)
Urobilinogen, UA: 0.2 mg/dL (ref 0.0–1.0)
pH: 5.5 (ref 5.0–8.0)
pH: 5.5 (ref 5.0–8.0)

## 2011-03-09 LAB — COMPREHENSIVE METABOLIC PANEL
ALT: <8 U/L (ref 0–35)
AST: 13 U/L (ref 0–37)
Albumin: 4 g/dL (ref 3.5–5.2)
Alkaline Phosphatase: 47 U/L (ref 39–117)
BUN: 40 mg/dL — ABNORMAL HIGH (ref 6–23)
CO2: 27 mEq/L (ref 19–32)
Calcium: 9.3 mg/dL (ref 8.4–10.5)
Chloride: 103 mEq/L (ref 96–112)
Creatinine, Ser: 3.92 mg/dL — ABNORMAL HIGH (ref 0.4–1.2)
GFR calc Af Amer: 16 mL/min — ABNORMAL LOW (ref >60)
GFR calc non Af Amer: 13 mL/min — ABNORMAL LOW (ref >60)
Glucose, Bld: 116 mg/dL — ABNORMAL HIGH (ref 70–99)
Potassium: 4.2 mEq/L (ref 3.5–5.1)
Sodium: 140 mEq/L (ref 135–145)
Total Bilirubin: 0.5 mg/dL (ref 0.3–1.2)
Total Protein: 6.9 g/dL (ref 6.0–8.3)

## 2011-03-09 LAB — CBC
HCT: 22.9 % — ABNORMAL LOW (ref 36.0–46.0)
HCT: 17.3 % — ABNORMAL LOW (ref 36.0–46.0)
HCT: 18.9 % — ABNORMAL LOW (ref 36.0–46.0)
HCT: 19.6 % — ABNORMAL LOW (ref 36.0–46.0)
Hemoglobin: 7.4 g/dL — ABNORMAL LOW (ref 12.0–15.0)
Hemoglobin: 5.6 g/dL — CL (ref 12.0–15.0)
Hemoglobin: 6.3 g/dL — CL (ref 12.0–15.0)
Hemoglobin: 6.3 g/dL — CL (ref 12.0–15.0)
MCH: 28.8 pg (ref 26.0–34.0)
MCH: 26.9 pg (ref 26.0–34.0)
MCH: 27.3 pg (ref 26.0–34.0)
MCH: 27.8 pg (ref 26.0–34.0)
MCHC: 32.4 g/dL (ref 30.0–36.0)
MCHC: 32.2 g/dL (ref 30.0–36.0)
MCHC: 32.6 g/dL (ref 30.0–36.0)
MCHC: 33.5 g/dL (ref 30.0–36.0)
MCV: 88.9 fL (ref 78.0–100.0)
MCV: 83 fL (ref 78.0–100.0)
MCV: 83.6 fL (ref 78.0–100.0)
MCV: 83.7 fL (ref 78.0–100.0)
Platelets: 317 10*3/uL (ref 150–400)
Platelets: 350 10*3/uL (ref 150–400)
Platelets: 353 10*3/uL (ref 150–400)
Platelets: 372 10*3/uL (ref 150–400)
RBC: 2.58 MIL/uL — ABNORMAL LOW (ref 3.87–5.11)
RBC: 2.06 MIL/uL — ABNORMAL LOW (ref 3.87–5.11)
RBC: 2.27 MIL/uL — ABNORMAL LOW (ref 3.87–5.11)
RBC: 2.34 MIL/uL — ABNORMAL LOW (ref 3.87–5.11)
RDW: 18.8 % — ABNORMAL HIGH (ref 11.5–15.5)
RDW: 16.7 % — ABNORMAL HIGH (ref 11.5–15.5)
RDW: 17.4 % — ABNORMAL HIGH (ref 11.5–15.5)
RDW: 17.4 % — ABNORMAL HIGH (ref 11.5–15.5)
WBC: 9.6 10*3/uL (ref 4.0–10.5)
WBC: 6.8 10*3/uL (ref 4.0–10.5)
WBC: 7.1 10*3/uL (ref 4.0–10.5)
WBC: 8.1 10*3/uL (ref 4.0–10.5)

## 2011-03-09 LAB — APTT: aPTT: 36 seconds (ref 24–37)

## 2011-03-09 LAB — CROSSMATCH
ABO/RH(D): O POS
Antibody Screen: NEGATIVE

## 2011-03-09 LAB — BASIC METABOLIC PANEL
BUN: 31 mg/dL — ABNORMAL HIGH (ref 6–23)
CO2: 24 mEq/L (ref 19–32)
Calcium: 9.1 mg/dL (ref 8.4–10.5)
Chloride: 107 mEq/L (ref 96–112)
Creatinine, Ser: 3.83 mg/dL — ABNORMAL HIGH (ref 0.4–1.2)
GFR calc Af Amer: 16 mL/min — ABNORMAL LOW (ref >60)
GFR calc non Af Amer: 13 mL/min — ABNORMAL LOW (ref >60)
Glucose, Bld: 109 mg/dL — ABNORMAL HIGH (ref 70–99)
Potassium: 3.6 mEq/L (ref 3.5–5.1)
Sodium: 140 mEq/L (ref 135–145)

## 2011-03-09 LAB — URINE CULTURE
Colony Count: 45000
Culture  Setup Time: 201108061505
Special Requests: NEGATIVE

## 2011-03-09 LAB — DIFFERENTIAL
Basophils Absolute: 0 10*3/uL (ref 0.0–0.1)
Basophils Relative: 1 % (ref 0–1)
Eosinophils Absolute: 0.1 10*3/uL (ref 0.0–0.7)
Eosinophils Relative: 2 % (ref 0–5)
Lymphocytes Relative: 16 % (ref 12–46)
Lymphs Abs: 1.1 10*3/uL (ref 0.7–4.0)
Monocytes Absolute: 0.3 10*3/uL (ref 0.1–1.0)
Monocytes Relative: 5 % (ref 3–12)
Neutro Abs: 5.6 10*3/uL (ref 1.7–7.7)
Neutrophils Relative %: 77 % (ref 43–77)

## 2011-03-09 LAB — SURGICAL PCR SCREEN
MRSA, PCR: NEGATIVE
Staphylococcus aureus: NEGATIVE

## 2011-03-09 LAB — PROTIME-INR
INR: 0.97 (ref 0.00–1.49)
Prothrombin Time: 13.1 seconds (ref 11.6–15.2)

## 2011-03-09 LAB — URINE MICROSCOPIC-ADD ON

## 2011-03-09 LAB — SAMPLE TO BLOOD BANK

## 2011-03-09 LAB — MRSA PCR SCREENING: MRSA by PCR: NEGATIVE

## 2011-03-10 LAB — DIFFERENTIAL
Basophils Absolute: 0 10*3/uL (ref 0.0–0.1)
Basophils Absolute: 0 10*3/uL (ref 0.0–0.1)
Basophils Absolute: 0.1 10*3/uL (ref 0.0–0.1)
Basophils Relative: 0 % (ref 0–1)
Basophils Relative: 0 % (ref 0–1)
Basophils Relative: 1 % (ref 0–1)
Eosinophils Absolute: 0.1 10*3/uL (ref 0.0–0.7)
Eosinophils Absolute: 0.2 10*3/uL (ref 0.0–0.7)
Eosinophils Absolute: 0.3 10*3/uL (ref 0.0–0.7)
Eosinophils Relative: 1 % (ref 0–5)
Eosinophils Relative: 2 % (ref 0–5)
Eosinophils Relative: 2 % (ref 0–5)
Lymphocytes Relative: 12 % (ref 12–46)
Lymphocytes Relative: 16 % (ref 12–46)
Lymphocytes Relative: 18 % (ref 12–46)
Lymphs Abs: 1.3 10*3/uL (ref 0.7–4.0)
Lymphs Abs: 1.6 10*3/uL (ref 0.7–4.0)
Lymphs Abs: 1.6 10*3/uL (ref 0.7–4.0)
Monocytes Absolute: 0.2 10*3/uL (ref 0.1–1.0)
Monocytes Absolute: 0.5 10*3/uL (ref 0.1–1.0)
Monocytes Absolute: 0.5 10*3/uL (ref 0.1–1.0)
Monocytes Relative: 2 % — ABNORMAL LOW (ref 3–12)
Monocytes Relative: 5 % (ref 3–12)
Monocytes Relative: 6 % (ref 3–12)
Neutro Abs: 6.9 10*3/uL (ref 1.7–7.7)
Neutro Abs: 7.6 10*3/uL (ref 1.7–7.7)
Neutro Abs: 8.8 10*3/uL — ABNORMAL HIGH (ref 1.7–7.7)
Neutrophils Relative %: 75 % (ref 43–77)
Neutrophils Relative %: 76 % (ref 43–77)
Neutrophils Relative %: 84 % — ABNORMAL HIGH (ref 43–77)

## 2011-03-10 LAB — BASIC METABOLIC PANEL
BUN: 35 mg/dL — ABNORMAL HIGH (ref 6–23)
BUN: 36 mg/dL — ABNORMAL HIGH (ref 6–23)
BUN: 36 mg/dL — ABNORMAL HIGH (ref 6–23)
BUN: 38 mg/dL — ABNORMAL HIGH (ref 6–23)
BUN: 41 mg/dL — ABNORMAL HIGH (ref 6–23)
CO2: 20 mEq/L (ref 19–32)
CO2: 22 mEq/L (ref 19–32)
CO2: 24 mEq/L (ref 19–32)
CO2: 24 mEq/L (ref 19–32)
CO2: 20 mEq/L (ref 19–32)
Calcium: 7.3 mg/dL — ABNORMAL LOW (ref 8.4–10.5)
Calcium: 7.8 mg/dL — ABNORMAL LOW (ref 8.4–10.5)
Calcium: 8.1 mg/dL — ABNORMAL LOW (ref 8.4–10.5)
Calcium: 8.6 mg/dL (ref 8.4–10.5)
Calcium: 7.3 mg/dL — ABNORMAL LOW (ref 8.4–10.5)
Chloride: 106 mEq/L (ref 96–112)
Chloride: 106 mEq/L (ref 96–112)
Chloride: 107 mEq/L (ref 96–112)
Chloride: 110 mEq/L (ref 96–112)
Chloride: 109 mEq/L (ref 96–112)
Creatinine, Ser: 4.5 mg/dL — ABNORMAL HIGH (ref 0.4–1.2)
Creatinine, Ser: 4.5 mg/dL — ABNORMAL HIGH (ref 0.4–1.2)
Creatinine, Ser: 4.64 mg/dL — ABNORMAL HIGH (ref 0.4–1.2)
Creatinine, Ser: 4.84 mg/dL — ABNORMAL HIGH (ref 0.4–1.2)
Creatinine, Ser: 4.74 mg/dL — ABNORMAL HIGH (ref 0.4–1.2)
GFR calc Af Amer: 12 mL/min — ABNORMAL LOW (ref >60)
GFR calc Af Amer: 13 mL/min — ABNORMAL LOW (ref >60)
GFR calc Af Amer: 13 mL/min — ABNORMAL LOW (ref >60)
GFR calc Af Amer: 13 mL/min — ABNORMAL LOW (ref >60)
GFR calc Af Amer: 13 mL/min — ABNORMAL LOW (ref >60)
GFR calc non Af Amer: 10 mL/min — ABNORMAL LOW (ref >60)
GFR calc non Af Amer: 11 mL/min — ABNORMAL LOW (ref >60)
GFR calc non Af Amer: 11 mL/min — ABNORMAL LOW (ref >60)
GFR calc non Af Amer: 11 mL/min — ABNORMAL LOW (ref >60)
GFR calc non Af Amer: 10 mL/min — ABNORMAL LOW (ref >60)
Glucose, Bld: 109 mg/dL — ABNORMAL HIGH (ref 70–99)
Glucose, Bld: 112 mg/dL — ABNORMAL HIGH (ref 70–99)
Glucose, Bld: 69 mg/dL — ABNORMAL LOW (ref 70–99)
Glucose, Bld: 72 mg/dL (ref 70–99)
Glucose, Bld: 186 mg/dL — ABNORMAL HIGH (ref 70–99)
Potassium: 4.1 mEq/L (ref 3.5–5.1)
Potassium: 4.1 mEq/L (ref 3.5–5.1)
Potassium: 4.1 mEq/L (ref 3.5–5.1)
Potassium: 4.4 mEq/L (ref 3.5–5.1)
Potassium: 4.5 mEq/L (ref 3.5–5.1)
Sodium: 138 mEq/L (ref 135–145)
Sodium: 139 mEq/L (ref 135–145)
Sodium: 140 mEq/L (ref 135–145)
Sodium: 140 mEq/L (ref 135–145)
Sodium: 140 mEq/L (ref 135–145)

## 2011-03-10 LAB — RETICULOCYTES
RBC.: 2.79 MIL/uL — ABNORMAL LOW (ref 3.87–5.11)
RBC.: 2.97 MIL/uL — ABNORMAL LOW (ref 3.87–5.11)
Retic Count, Absolute: 19.5 10*3/uL (ref 19.0–186.0)
Retic Count, Absolute: 32.7 10*3/uL (ref 19.0–186.0)
Retic Ct Pct: 0.7 % (ref 0.4–3.1)
Retic Ct Pct: 1.1 % (ref 0.4–3.1)

## 2011-03-10 LAB — CBC
HCT: 21.6 % — ABNORMAL LOW (ref 36.0–46.0)
HCT: 21.7 % — ABNORMAL LOW (ref 36.0–46.0)
HCT: 23.8 % — ABNORMAL LOW (ref 36.0–46.0)
HCT: 24.2 % — ABNORMAL LOW (ref 36.0–46.0)
HCT: 24.5 % — ABNORMAL LOW (ref 36.0–46.0)
HCT: 25.5 % — ABNORMAL LOW (ref 36.0–46.0)
HCT: 22 % — ABNORMAL LOW (ref 36.0–46.0)
Hemoglobin: 7.2 g/dL — ABNORMAL LOW (ref 12.0–15.0)
Hemoglobin: 7.2 g/dL — ABNORMAL LOW (ref 12.0–15.0)
Hemoglobin: 7.8 g/dL — ABNORMAL LOW (ref 12.0–15.0)
Hemoglobin: 7.9 g/dL — ABNORMAL LOW (ref 12.0–15.0)
Hemoglobin: 8 g/dL — ABNORMAL LOW (ref 12.0–15.0)
Hemoglobin: 8.4 g/dL — ABNORMAL LOW (ref 12.0–15.0)
Hemoglobin: 7.2 g/dL — ABNORMAL LOW (ref 12.0–15.0)
MCH: 27.4 pg (ref 26.0–34.0)
MCH: 27.5 pg (ref 26.0–34.0)
MCH: 27.5 pg (ref 26.0–34.0)
MCH: 27.5 pg (ref 26.0–34.0)
MCH: 27.5 pg (ref 26.0–34.0)
MCH: 27.6 pg (ref 26.0–34.0)
MCH: 27.5 pg (ref 26.0–34.0)
MCHC: 32.6 g/dL (ref 30.0–36.0)
MCHC: 32.7 g/dL (ref 30.0–36.0)
MCHC: 33 g/dL (ref 30.0–36.0)
MCHC: 33.1 g/dL (ref 30.0–36.0)
MCHC: 33.2 g/dL (ref 30.0–36.0)
MCHC: 33.2 g/dL (ref 30.0–36.0)
MCHC: 32.9 g/dL (ref 30.0–36.0)
MCV: 82.7 fL (ref 78.0–100.0)
MCV: 83.2 fL (ref 78.0–100.0)
MCV: 83.2 fL (ref 78.0–100.0)
MCV: 83.3 fL (ref 78.0–100.0)
MCV: 83.9 fL (ref 78.0–100.0)
MCV: 84.6 fL (ref 78.0–100.0)
MCV: 83.7 fL (ref 78.0–100.0)
Platelets: 235 10*3/uL (ref 150–400)
Platelets: 253 10*3/uL (ref 150–400)
Platelets: 265 10*3/uL (ref 150–400)
Platelets: 269 10*3/uL (ref 150–400)
Platelets: 296 10*3/uL (ref 150–400)
Platelets: 299 10*3/uL (ref 150–400)
Platelets: 281 10*3/uL (ref 150–400)
RBC: 2.6 MIL/uL — ABNORMAL LOW (ref 3.87–5.11)
RBC: 2.63 MIL/uL — ABNORMAL LOW (ref 3.87–5.11)
RBC: 2.85 MIL/uL — ABNORMAL LOW (ref 3.87–5.11)
RBC: 2.86 MIL/uL — ABNORMAL LOW (ref 3.87–5.11)
RBC: 2.92 MIL/uL — ABNORMAL LOW (ref 3.87–5.11)
RBC: 3.06 MIL/uL — ABNORMAL LOW (ref 3.87–5.11)
RBC: 2.63 MIL/uL — ABNORMAL LOW (ref 3.87–5.11)
RDW: 16.4 % — ABNORMAL HIGH (ref 11.5–15.5)
RDW: 16.4 % — ABNORMAL HIGH (ref 11.5–15.5)
RDW: 16.7 % — ABNORMAL HIGH (ref 11.5–15.5)
RDW: 16.7 % — ABNORMAL HIGH (ref 11.5–15.5)
RDW: 16.8 % — ABNORMAL HIGH (ref 11.5–15.5)
RDW: 16.8 % — ABNORMAL HIGH (ref 11.5–15.5)
RDW: 17.1 % — ABNORMAL HIGH (ref 11.5–15.5)
WBC: 10 10*3/uL (ref 4.0–10.5)
WBC: 10.5 10*3/uL (ref 4.0–10.5)
WBC: 8.8 10*3/uL (ref 4.0–10.5)
WBC: 9.1 10*3/uL (ref 4.0–10.5)
WBC: 9.3 10*3/uL (ref 4.0–10.5)
WBC: 9.5 10*3/uL (ref 4.0–10.5)
WBC: 10.9 10*3/uL — ABNORMAL HIGH (ref 4.0–10.5)

## 2011-03-10 LAB — GLUCOSE, CAPILLARY
Glucose-Capillary: 111 mg/dL — ABNORMAL HIGH (ref 70–99)
Glucose-Capillary: 120 mg/dL — ABNORMAL HIGH (ref 70–99)
Glucose-Capillary: 121 mg/dL — ABNORMAL HIGH (ref 70–99)
Glucose-Capillary: 122 mg/dL — ABNORMAL HIGH (ref 70–99)
Glucose-Capillary: 128 mg/dL — ABNORMAL HIGH (ref 70–99)
Glucose-Capillary: 130 mg/dL — ABNORMAL HIGH (ref 70–99)
Glucose-Capillary: 139 mg/dL — ABNORMAL HIGH (ref 70–99)
Glucose-Capillary: 148 mg/dL — ABNORMAL HIGH (ref 70–99)
Glucose-Capillary: 149 mg/dL — ABNORMAL HIGH (ref 70–99)
Glucose-Capillary: 158 mg/dL — ABNORMAL HIGH (ref 70–99)
Glucose-Capillary: 159 mg/dL — ABNORMAL HIGH (ref 70–99)
Glucose-Capillary: 170 mg/dL — ABNORMAL HIGH (ref 70–99)
Glucose-Capillary: 171 mg/dL — ABNORMAL HIGH (ref 70–99)
Glucose-Capillary: 171 mg/dL — ABNORMAL HIGH (ref 70–99)
Glucose-Capillary: 172 mg/dL — ABNORMAL HIGH (ref 70–99)
Glucose-Capillary: 180 mg/dL — ABNORMAL HIGH (ref 70–99)
Glucose-Capillary: 225 mg/dL — ABNORMAL HIGH (ref 70–99)
Glucose-Capillary: 69 mg/dL — ABNORMAL LOW (ref 70–99)
Glucose-Capillary: 71 mg/dL (ref 70–99)
Glucose-Capillary: 85 mg/dL (ref 70–99)
Glucose-Capillary: 87 mg/dL (ref 70–99)
Glucose-Capillary: 87 mg/dL (ref 70–99)
Glucose-Capillary: 89 mg/dL (ref 70–99)
Glucose-Capillary: 90 mg/dL (ref 70–99)
Glucose-Capillary: 91 mg/dL (ref 70–99)
Glucose-Capillary: 97 mg/dL (ref 70–99)
Glucose-Capillary: 99 mg/dL (ref 70–99)
Glucose-Capillary: 99 mg/dL (ref 70–99)
Glucose-Capillary: 114 mg/dL — ABNORMAL HIGH (ref 70–99)
Glucose-Capillary: 120 mg/dL — ABNORMAL HIGH (ref 70–99)
Glucose-Capillary: 121 mg/dL — ABNORMAL HIGH (ref 70–99)
Glucose-Capillary: 129 mg/dL — ABNORMAL HIGH (ref 70–99)
Glucose-Capillary: 136 mg/dL — ABNORMAL HIGH (ref 70–99)
Glucose-Capillary: 169 mg/dL — ABNORMAL HIGH (ref 70–99)
Glucose-Capillary: 174 mg/dL — ABNORMAL HIGH (ref 70–99)
Glucose-Capillary: 180 mg/dL — ABNORMAL HIGH (ref 70–99)
Glucose-Capillary: 78 mg/dL (ref 70–99)

## 2011-03-10 LAB — URINE MICROSCOPIC-ADD ON

## 2011-03-10 LAB — RENAL FUNCTION PANEL
Albumin: 3.5 g/dL (ref 3.5–5.2)
Albumin: 4 g/dL (ref 3.5–5.2)
BUN: 31 mg/dL — ABNORMAL HIGH (ref 6–23)
BUN: 34 mg/dL — ABNORMAL HIGH (ref 6–23)
CO2: 23 mEq/L (ref 19–32)
CO2: 24 mEq/L (ref 19–32)
Calcium: 8.3 mg/dL — ABNORMAL LOW (ref 8.4–10.5)
Calcium: 8.8 mg/dL (ref 8.4–10.5)
Chloride: 105 mEq/L (ref 96–112)
Chloride: 106 mEq/L (ref 96–112)
Creatinine, Ser: 4.56 mg/dL — ABNORMAL HIGH (ref 0.4–1.2)
Creatinine, Ser: 4.71 mg/dL — ABNORMAL HIGH (ref 0.4–1.2)
GFR calc Af Amer: 13 mL/min — ABNORMAL LOW (ref >60)
GFR calc Af Amer: 13 mL/min — ABNORMAL LOW (ref >60)
GFR calc non Af Amer: 10 mL/min — ABNORMAL LOW (ref >60)
GFR calc non Af Amer: 11 mL/min — ABNORMAL LOW (ref >60)
Glucose, Bld: 68 mg/dL — ABNORMAL LOW (ref 70–99)
Glucose, Bld: 77 mg/dL (ref 70–99)
Phosphorus: 4.9 mg/dL — ABNORMAL HIGH (ref 2.3–4.6)
Phosphorus: 5.6 mg/dL — ABNORMAL HIGH (ref 2.3–4.6)
Potassium: 4.2 mEq/L (ref 3.5–5.1)
Potassium: 4.5 mEq/L (ref 3.5–5.1)
Sodium: 138 mEq/L (ref 135–145)
Sodium: 140 mEq/L (ref 135–145)

## 2011-03-10 LAB — URINALYSIS, ROUTINE W REFLEX MICROSCOPIC
Bilirubin Urine: NEGATIVE
Glucose, UA: 100 mg/dL — AB
Ketones, ur: NEGATIVE mg/dL
Nitrite: NEGATIVE
Protein, ur: 30 mg/dL — AB
Specific Gravity, Urine: 1.015 (ref 1.005–1.030)
Urobilinogen, UA: 0.2 mg/dL (ref 0.0–1.0)
pH: 5 (ref 5.0–8.0)

## 2011-03-10 LAB — COMPREHENSIVE METABOLIC PANEL
ALT: <8 U/L (ref 0–35)
AST: 9 U/L (ref 0–37)
Albumin: 3.6 g/dL (ref 3.5–5.2)
Alkaline Phosphatase: 38 U/L — ABNORMAL LOW (ref 39–117)
BUN: 35 mg/dL — ABNORMAL HIGH (ref 6–23)
CO2: 26 mEq/L (ref 19–32)
Calcium: 8.6 mg/dL (ref 8.4–10.5)
Chloride: 109 mEq/L (ref 96–112)
Creatinine, Ser: 4.73 mg/dL — ABNORMAL HIGH (ref 0.4–1.2)
GFR calc Af Amer: 13 mL/min — ABNORMAL LOW (ref >60)
GFR calc non Af Amer: 10 mL/min — ABNORMAL LOW (ref >60)
Glucose, Bld: 75 mg/dL (ref 70–99)
Potassium: 4.1 mEq/L (ref 3.5–5.1)
Sodium: 143 mEq/L (ref 135–145)
Total Bilirubin: 0.6 mg/dL (ref 0.3–1.2)
Total Protein: 6.2 g/dL (ref 6.0–8.3)

## 2011-03-10 LAB — CMV ANTIBODY, IGG (EIA): CMV Ab - IgG: >10 IU/mL — ABNORMAL HIGH (ref <0.4)

## 2011-03-10 LAB — CLOSTRIDIUM DIFFICILE EIA: C difficile Toxins A+B, EIA: NEGATIVE

## 2011-03-10 LAB — URINE CULTURE
Colony Count: 60000
Special Requests: NEGATIVE

## 2011-03-10 LAB — URIC ACID: Uric Acid, Serum: 7.1 mg/dL — ABNORMAL HIGH (ref 2.4–7.0)

## 2011-03-10 LAB — LACTATE DEHYDROGENASE: LDH: 164 U/L (ref 94–250)

## 2011-03-10 LAB — MRSA PCR SCREENING: MRSA by PCR: NEGATIVE

## 2011-03-10 LAB — FERRITIN: Ferritin: 1335 ng/mL — ABNORMAL HIGH (ref 10–291)

## 2011-03-10 LAB — ANA: Anti Nuclear Antibody(ANA): NEGATIVE

## 2011-03-10 LAB — EPSTEIN-BARR VIRUS VCA, IGM: EBV VCA IgM: 0.05 {ISR}

## 2011-03-10 LAB — TACROLIMUS, BLOOD: Tacrolimus Lvl: 4 ng/mL — ABNORMAL LOW (ref 5.0–20.0)

## 2011-03-10 LAB — CMV IGM: CMV IgM: <8 AU/mL (ref <30.0)

## 2011-03-10 LAB — ERYTHROPOIETIN: Erythropoietin: 1788 m[IU]/mL — ABNORMAL HIGH (ref 2.6–34.0)

## 2011-03-10 LAB — PARVOVIRUS B19 ANTIBODY, IGG AND IGM
Parovirus B19 IgG Abs: 4.9 Index — ABNORMAL HIGH (ref <0.9)
Parovirus B19 IgM Abs: <0.9 Index (ref <0.9)

## 2011-03-11 LAB — DIFFERENTIAL
Basophils Absolute: 0 10*3/uL (ref 0.0–0.1)
Basophils Relative: 0 % (ref 0–1)
Eosinophils Absolute: 0 10*3/uL (ref 0.0–0.7)
Eosinophils Relative: 0 % (ref 0–5)
Lymphocytes Relative: 11 % — ABNORMAL LOW (ref 12–46)
Lymphs Abs: 1.3 10*3/uL (ref 0.7–4.0)
Monocytes Absolute: 0.4 10*3/uL (ref 0.1–1.0)
Monocytes Relative: 3 % (ref 3–12)
Neutro Abs: 9.5 10*3/uL — ABNORMAL HIGH (ref 1.7–7.7)
Neutrophils Relative %: 85 % — ABNORMAL HIGH (ref 43–77)

## 2011-03-11 LAB — CBC
HCT: 20.8 % — ABNORMAL LOW (ref 36.0–46.0)
HCT: 23.2 % — ABNORMAL LOW (ref 36.0–46.0)
Hemoglobin: 6.9 g/dL — CL (ref 12.0–15.0)
Hemoglobin: 7.4 g/dL — ABNORMAL LOW (ref 12.0–15.0)
MCH: 26.9 pg (ref 26.0–34.0)
MCHC: 33.1 g/dL (ref 30.0–36.0)
MCHC: 31.8 g/dL (ref 30.0–36.0)
MCV: 81.3 fL (ref 78.0–100.0)
MCV: 81.6 fL (ref 78.0–100.0)
Platelets: 339 10*3/uL (ref 150–400)
Platelets: 327 10*3/uL (ref 150–400)
RBC: 2.55 MIL/uL — ABNORMAL LOW (ref 3.87–5.11)
RBC: 2.84 MIL/uL — ABNORMAL LOW (ref 3.87–5.11)
RDW: 15.9 % — ABNORMAL HIGH (ref 11.5–15.5)
RDW: 15 % (ref 11.5–15.5)
WBC: 10.1 10*3/uL (ref 4.0–10.5)
WBC: 11.2 10*3/uL — ABNORMAL HIGH (ref 4.0–10.5)

## 2011-03-11 LAB — BASIC METABOLIC PANEL
BUN: 35 mg/dL — ABNORMAL HIGH (ref 6–23)
BUN: 43 mg/dL — ABNORMAL HIGH (ref 6–23)
CO2: 24 mEq/L (ref 19–32)
CO2: 21 mEq/L (ref 19–32)
Calcium: 9 mg/dL (ref 8.4–10.5)
Calcium: 10.4 mg/dL (ref 8.4–10.5)
Chloride: 102 mEq/L (ref 96–112)
Chloride: 104 mEq/L (ref 96–112)
Creatinine, Ser: 4.98 mg/dL — ABNORMAL HIGH (ref 0.4–1.2)
Creatinine, Ser: 5.61 mg/dL — ABNORMAL HIGH (ref 0.4–1.2)
GFR calc Af Amer: 12 mL/min — ABNORMAL LOW (ref >60)
GFR calc Af Amer: 10 mL/min — ABNORMAL LOW (ref >60)
GFR calc non Af Amer: 10 mL/min — ABNORMAL LOW (ref >60)
GFR calc non Af Amer: 9 mL/min — ABNORMAL LOW (ref >60)
Glucose, Bld: 209 mg/dL — ABNORMAL HIGH (ref 70–99)
Glucose, Bld: 139 mg/dL — ABNORMAL HIGH (ref 70–99)
Potassium: 4.1 mEq/L (ref 3.5–5.1)
Potassium: 3.5 mEq/L (ref 3.5–5.1)
Sodium: 137 mEq/L (ref 135–145)
Sodium: 134 mEq/L — ABNORMAL LOW (ref 135–145)

## 2011-03-11 LAB — GLUCOSE, CAPILLARY: Glucose-Capillary: 210 mg/dL — ABNORMAL HIGH (ref 70–99)

## 2011-03-11 LAB — CROSSMATCH
ABO/RH(D): O POS
Antibody Screen: NEGATIVE

## 2011-03-11 LAB — TSH: TSH: 0.547 u[IU]/mL (ref 0.350–4.500)

## 2011-03-12 LAB — COMPREHENSIVE METABOLIC PANEL
ALT: <8 U/L (ref 0–35)
AST: 8 U/L (ref 0–37)
Albumin: 4.2 g/dL (ref 3.5–5.2)
Alkaline Phosphatase: 49 U/L (ref 39–117)
BUN: 49 mg/dL — ABNORMAL HIGH (ref 6–23)
CO2: 22 mEq/L (ref 19–32)
Calcium: 9.6 mg/dL (ref 8.4–10.5)
Chloride: 104 mEq/L (ref 96–112)
Creatinine, Ser: 6.4 mg/dL — ABNORMAL HIGH (ref 0.4–1.2)
GFR calc Af Amer: 9 mL/min — ABNORMAL LOW (ref >60)
GFR calc non Af Amer: 7 mL/min — ABNORMAL LOW (ref >60)
Glucose, Bld: 89 mg/dL (ref 70–99)
Potassium: 3.4 mEq/L — ABNORMAL LOW (ref 3.5–5.1)
Sodium: 139 mEq/L (ref 135–145)
Total Bilirubin: 0.4 mg/dL (ref 0.3–1.2)
Total Protein: 6.8 g/dL (ref 6.0–8.3)

## 2011-03-12 LAB — CBC
HCT: 22.9 % — ABNORMAL LOW (ref 36.0–46.0)
HCT: 23.7 % — ABNORMAL LOW (ref 36.0–46.0)
HCT: 24.2 % — ABNORMAL LOW (ref 36.0–46.0)
HCT: 24.4 % — ABNORMAL LOW (ref 36.0–46.0)
Hemoglobin: 7.4 g/dL — ABNORMAL LOW (ref 12.0–15.0)
Hemoglobin: 7.7 g/dL — ABNORMAL LOW (ref 12.0–15.0)
Hemoglobin: 7.9 g/dL — ABNORMAL LOW (ref 12.0–15.0)
Hemoglobin: 8 g/dL — ABNORMAL LOW (ref 12.0–15.0)
MCH: 26.3 pg (ref 26.0–34.0)
MCH: 26.7 pg (ref 26.0–34.0)
MCH: 26.7 pg (ref 26.0–34.0)
MCH: 26.8 pg (ref 26.0–34.0)
MCHC: 32.4 g/dL (ref 30.0–36.0)
MCHC: 32.4 g/dL (ref 30.0–36.0)
MCHC: 32.6 g/dL (ref 30.0–36.0)
MCHC: 32.9 g/dL (ref 30.0–36.0)
MCV: 81.3 fL (ref 78.0–100.0)
MCV: 81.4 fL (ref 78.0–100.0)
MCV: 82 fL (ref 78.0–100.0)
MCV: 82.3 fL (ref 78.0–100.0)
Platelets: 201 10*3/uL (ref 150–400)
Platelets: 204 10*3/uL (ref 150–400)
Platelets: 209 10*3/uL (ref 150–400)
Platelets: 218 10*3/uL (ref 150–400)
RBC: 2.79 MIL/uL — ABNORMAL LOW (ref 3.87–5.11)
RBC: 2.92 MIL/uL — ABNORMAL LOW (ref 3.87–5.11)
RBC: 2.95 MIL/uL — ABNORMAL LOW (ref 3.87–5.11)
RBC: 3 MIL/uL — ABNORMAL LOW (ref 3.87–5.11)
RDW: 15.4 % (ref 11.5–15.5)
RDW: 15.4 % (ref 11.5–15.5)
RDW: 15.8 % — ABNORMAL HIGH (ref 11.5–15.5)
RDW: 16 % — ABNORMAL HIGH (ref 11.5–15.5)
WBC: 11 10*3/uL — ABNORMAL HIGH (ref 4.0–10.5)
WBC: 8.4 10*3/uL (ref 4.0–10.5)
WBC: 8.7 10*3/uL (ref 4.0–10.5)
WBC: 9.1 10*3/uL (ref 4.0–10.5)

## 2011-03-12 LAB — RENAL FUNCTION PANEL
Albumin: 3.7 g/dL (ref 3.5–5.2)
BUN: 38 mg/dL — ABNORMAL HIGH (ref 6–23)
CO2: 24 mEq/L (ref 19–32)
Calcium: 9.3 mg/dL (ref 8.4–10.5)
Chloride: 104 mEq/L (ref 96–112)
Creatinine, Ser: 4.83 mg/dL — ABNORMAL HIGH (ref 0.4–1.2)
GFR calc Af Amer: 12 mL/min — ABNORMAL LOW (ref >60)
GFR calc non Af Amer: 10 mL/min — ABNORMAL LOW (ref >60)
Glucose, Bld: 159 mg/dL — ABNORMAL HIGH (ref 70–99)
Phosphorus: 3.3 mg/dL (ref 2.3–4.6)
Potassium: 3.6 mEq/L (ref 3.5–5.1)
Sodium: 138 mEq/L (ref 135–145)

## 2011-03-12 LAB — BASIC METABOLIC PANEL
BUN: 45 mg/dL — ABNORMAL HIGH (ref 6–23)
CO2: 23 mEq/L (ref 19–32)
Calcium: 9.2 mg/dL (ref 8.4–10.5)
Chloride: 107 mEq/L (ref 96–112)
Creatinine, Ser: 5.39 mg/dL — ABNORMAL HIGH (ref 0.4–1.2)
GFR calc Af Amer: 11 mL/min — ABNORMAL LOW (ref >60)
GFR calc non Af Amer: 9 mL/min — ABNORMAL LOW (ref >60)
Glucose, Bld: 124 mg/dL — ABNORMAL HIGH (ref 70–99)
Potassium: 3.6 mEq/L (ref 3.5–5.1)
Sodium: 140 mEq/L (ref 135–145)

## 2011-03-12 LAB — CROSSMATCH
ABO/RH(D): O POS
Antibody Screen: NEGATIVE

## 2011-03-12 LAB — DIFFERENTIAL
Basophils Absolute: 0 10*3/uL (ref 0.0–0.1)
Basophils Relative: 0 % (ref 0–1)
Eosinophils Absolute: 0.2 10*3/uL (ref 0.0–0.7)
Eosinophils Relative: 2 % (ref 0–5)
Lymphocytes Relative: 17 % (ref 12–46)
Lymphs Abs: 1.4 10*3/uL (ref 0.7–4.0)
Monocytes Absolute: 0.3 10*3/uL (ref 0.1–1.0)
Monocytes Relative: 3 % (ref 3–12)
Neutro Abs: 6.8 10*3/uL (ref 1.7–7.7)
Neutrophils Relative %: 78 % — ABNORMAL HIGH (ref 43–77)

## 2011-03-12 LAB — HEMOCCULT GUIAC POC 1CARD (OFFICE): Fecal Occult Bld: NEGATIVE

## 2011-03-12 LAB — PROTIME-INR
INR: 1.03 (ref 0.00–1.49)
Prothrombin Time: 13.4 seconds (ref 11.6–15.2)

## 2011-03-12 LAB — IRON AND TIBC
Iron: 139 ug/dL — ABNORMAL HIGH (ref 42–135)
UIBC: <55 ug/dL

## 2011-03-12 LAB — RETICULOCYTES
RBC.: 2.93 MIL/uL — ABNORMAL LOW (ref 3.87–5.11)
Retic Count, Absolute: 41 10*3/uL (ref 19.0–186.0)
Retic Ct Pct: 1.4 % (ref 0.4–3.1)

## 2011-03-12 LAB — GLUCOSE, CAPILLARY
Glucose-Capillary: 119 mg/dL — ABNORMAL HIGH (ref 70–99)
Glucose-Capillary: 125 mg/dL — ABNORMAL HIGH (ref 70–99)
Glucose-Capillary: 151 mg/dL — ABNORMAL HIGH (ref 70–99)
Glucose-Capillary: 169 mg/dL — ABNORMAL HIGH (ref 70–99)
Glucose-Capillary: 184 mg/dL — ABNORMAL HIGH (ref 70–99)
Glucose-Capillary: 186 mg/dL — ABNORMAL HIGH (ref 70–99)
Glucose-Capillary: 187 mg/dL — ABNORMAL HIGH (ref 70–99)
Glucose-Capillary: 210 mg/dL — ABNORMAL HIGH (ref 70–99)
Glucose-Capillary: 232 mg/dL — ABNORMAL HIGH (ref 70–99)

## 2011-03-12 LAB — TACROLIMUS LEVEL: Tacrolimus (FK506) - LabCorp: 4.1 ng/mL

## 2011-03-12 LAB — TROPONIN I: Troponin I: 0.04 ng/mL (ref 0.00–0.06)

## 2011-03-12 LAB — CLOSTRIDIUM DIFFICILE EIA: C difficile Toxins A+B, EIA: NEGATIVE

## 2011-03-12 LAB — FOLATE: Folate: 7.9 ng/mL

## 2011-03-12 LAB — CULTURE, BLOOD (ROUTINE X 2)
Culture: NO GROWTH
Culture: NO GROWTH

## 2011-03-12 LAB — MAGNESIUM: Magnesium: 1.8 mg/dL (ref 1.5–2.5)

## 2011-03-12 LAB — LACTATE DEHYDROGENASE: LDH: 143 U/L (ref 94–250)

## 2011-03-12 LAB — CK TOTAL AND CKMB (NOT AT ARMC)
CK, MB: 0.8 ng/mL (ref 0.3–4.0)
Relative Index: INVALID (ref 0.0–2.5)
Total CK: 70 U/L (ref 7–177)

## 2011-03-12 LAB — PHOSPHORUS: Phosphorus: 4.5 mg/dL (ref 2.3–4.6)

## 2011-03-12 LAB — FERRITIN: Ferritin: 1325 ng/mL — ABNORMAL HIGH (ref 10–291)

## 2011-03-12 LAB — CREATININE, URINE, RANDOM: Creatinine, Urine: 49.4 mg/dL

## 2011-03-12 LAB — LIPASE, BLOOD: Lipase: 33 U/L (ref 11–59)

## 2011-03-12 LAB — MRSA PCR SCREENING: MRSA by PCR: NEGATIVE

## 2011-03-12 LAB — APTT: aPTT: 33 seconds (ref 24–37)

## 2011-03-12 LAB — NA AND K (SODIUM & POTASSIUM), RAND UR
Potassium Urine: 10 mEq/L
Sodium, Ur: 74 mEq/L

## 2011-03-12 LAB — VITAMIN B12: Vitamin B-12: 289 pg/mL (ref 211–911)

## 2011-03-13 LAB — RENAL FUNCTION PANEL
Albumin: 3.7 g/dL (ref 3.5–5.2)
Albumin: 3.8 g/dL (ref 3.5–5.2)
BUN: 45 mg/dL — ABNORMAL HIGH (ref 6–23)
BUN: 53 mg/dL — ABNORMAL HIGH (ref 6–23)
CO2: 27 mEq/L (ref 19–32)
CO2: 28 mEq/L (ref 19–32)
Calcium: 9.3 mg/dL (ref 8.4–10.5)
Calcium: 9.6 mg/dL (ref 8.4–10.5)
Chloride: 100 mEq/L (ref 96–112)
Chloride: 99 mEq/L (ref 96–112)
Creatinine, Ser: 4.67 mg/dL — ABNORMAL HIGH (ref 0.4–1.2)
Creatinine, Ser: 4.81 mg/dL — ABNORMAL HIGH (ref 0.4–1.2)
GFR calc Af Amer: 12 mL/min — ABNORMAL LOW (ref >60)
GFR calc Af Amer: 13 mL/min — ABNORMAL LOW (ref >60)
GFR calc non Af Amer: 10 mL/min — ABNORMAL LOW (ref >60)
GFR calc non Af Amer: 11 mL/min — ABNORMAL LOW (ref >60)
Glucose, Bld: 118 mg/dL — ABNORMAL HIGH (ref 70–99)
Glucose, Bld: 119 mg/dL — ABNORMAL HIGH (ref 70–99)
Phosphorus: 6.6 mg/dL — ABNORMAL HIGH (ref 2.3–4.6)
Phosphorus: 7.2 mg/dL — ABNORMAL HIGH (ref 2.3–4.6)
Potassium: 3.6 mEq/L (ref 3.5–5.1)
Potassium: 4.3 mEq/L (ref 3.5–5.1)
Sodium: 138 mEq/L (ref 135–145)
Sodium: 140 mEq/L (ref 135–145)

## 2011-03-13 LAB — DIFFERENTIAL
Basophils Absolute: 0 10*3/uL (ref 0.0–0.1)
Basophils Absolute: 0 10*3/uL (ref 0.0–0.1)
Basophils Relative: 0 % (ref 0–1)
Basophils Relative: 0 % (ref 0–1)
Eosinophils Absolute: 0 10*3/uL (ref 0.0–0.7)
Eosinophils Absolute: 0.1 10*3/uL (ref 0.0–0.7)
Eosinophils Relative: 0 % (ref 0–5)
Eosinophils Relative: 1 % (ref 0–5)
Lymphocytes Relative: 16 % (ref 12–46)
Lymphocytes Relative: 23 % (ref 12–46)
Lymphs Abs: 1.4 10*3/uL (ref 0.7–4.0)
Lymphs Abs: 1.7 10*3/uL (ref 0.7–4.0)
Monocytes Absolute: 0.4 10*3/uL (ref 0.1–1.0)
Monocytes Absolute: 0.4 10*3/uL (ref 0.1–1.0)
Monocytes Relative: 5 % (ref 3–12)
Monocytes Relative: 5 % (ref 3–12)
Neutro Abs: 5.5 10*3/uL (ref 1.7–7.7)
Neutro Abs: 6.6 10*3/uL (ref 1.7–7.7)
Neutrophils Relative %: 72 % (ref 43–77)
Neutrophils Relative %: 78 % — ABNORMAL HIGH (ref 43–77)

## 2011-03-13 LAB — RETICULOCYTES
RBC.: 2.78 MIL/uL — ABNORMAL LOW (ref 3.87–5.11)
Retic Count, Absolute: 25 10*3/uL (ref 19.0–186.0)
Retic Ct Pct: 0.9 % (ref 0.4–3.1)

## 2011-03-13 LAB — TROPONIN I: Troponin I: 0.05 ng/mL (ref 0.00–0.06)

## 2011-03-13 LAB — CBC
HCT: 21.4 % — ABNORMAL LOW (ref 36.0–46.0)
HCT: 22.3 % — ABNORMAL LOW (ref 36.0–46.0)
HCT: 27.6 % — ABNORMAL LOW (ref 36.0–46.0)
HCT: 27.9 % — ABNORMAL LOW (ref 36.0–46.0)
Hemoglobin: 7.1 g/dL — ABNORMAL LOW (ref 12.0–15.0)
Hemoglobin: 7.4 g/dL — ABNORMAL LOW (ref 12.0–15.0)
Hemoglobin: 9.2 g/dL — ABNORMAL LOW (ref 12.0–15.0)
Hemoglobin: 9.2 g/dL — ABNORMAL LOW (ref 12.0–15.0)
MCHC: 32.9 g/dL (ref 30.0–36.0)
MCHC: 33 g/dL (ref 30.0–36.0)
MCHC: 33 g/dL (ref 30.0–36.0)
MCHC: 33.2 g/dL (ref 30.0–36.0)
MCV: 81.6 fL (ref 78.0–100.0)
MCV: 81.7 fL (ref 78.0–100.0)
MCV: 82.2 fL (ref 78.0–100.0)
MCV: 82.4 fL (ref 78.0–100.0)
Platelets: 261 10*3/uL (ref 150–400)
Platelets: 264 10*3/uL (ref 150–400)
Platelets: 267 10*3/uL (ref 150–400)
Platelets: 293 10*3/uL (ref 150–400)
RBC: 2.62 MIL/uL — ABNORMAL LOW (ref 3.87–5.11)
RBC: 2.73 MIL/uL — ABNORMAL LOW (ref 3.87–5.11)
RBC: 3.36 MIL/uL — ABNORMAL LOW (ref 3.87–5.11)
RBC: 3.39 MIL/uL — ABNORMAL LOW (ref 3.87–5.11)
RDW: 15.8 % — ABNORMAL HIGH (ref 11.5–15.5)
RDW: 15.9 % — ABNORMAL HIGH (ref 11.5–15.5)
RDW: 15.9 % — ABNORMAL HIGH (ref 11.5–15.5)
RDW: 16 % — ABNORMAL HIGH (ref 11.5–15.5)
WBC: 10.1 10*3/uL (ref 4.0–10.5)
WBC: 7.7 10*3/uL (ref 4.0–10.5)
WBC: 8.4 10*3/uL (ref 4.0–10.5)
WBC: 9.9 10*3/uL (ref 4.0–10.5)

## 2011-03-13 LAB — COMPREHENSIVE METABOLIC PANEL
ALT: <8 U/L (ref 0–35)
AST: 8 U/L (ref 0–37)
Albumin: 3.9 g/dL (ref 3.5–5.2)
Alkaline Phosphatase: 45 U/L (ref 39–117)
BUN: 39 mg/dL — ABNORMAL HIGH (ref 6–23)
CO2: 28 mEq/L (ref 19–32)
Calcium: 9.7 mg/dL (ref 8.4–10.5)
Chloride: 102 mEq/L (ref 96–112)
Creatinine, Ser: 4.72 mg/dL — ABNORMAL HIGH (ref 0.4–1.2)
GFR calc Af Amer: 13 mL/min — ABNORMAL LOW (ref >60)
GFR calc non Af Amer: 11 mL/min — ABNORMAL LOW (ref >60)
Glucose, Bld: 103 mg/dL — ABNORMAL HIGH (ref 70–99)
Potassium: 4 mEq/L (ref 3.5–5.1)
Sodium: 138 mEq/L (ref 135–145)
Total Bilirubin: 0.5 mg/dL (ref 0.3–1.2)
Total Protein: 6.8 g/dL (ref 6.0–8.3)

## 2011-03-13 LAB — BASIC METABOLIC PANEL
BUN: 39 mg/dL — ABNORMAL HIGH (ref 6–23)
CO2: 26 mEq/L (ref 19–32)
Calcium: 9.3 mg/dL (ref 8.4–10.5)
Chloride: 101 mEq/L (ref 96–112)
Creatinine, Ser: 4.6 mg/dL — ABNORMAL HIGH (ref 0.4–1.2)
GFR calc Af Amer: 13 mL/min — ABNORMAL LOW (ref >60)
GFR calc non Af Amer: 11 mL/min — ABNORMAL LOW (ref >60)
Glucose, Bld: 90 mg/dL (ref 70–99)
Potassium: 3.5 mEq/L (ref 3.5–5.1)
Sodium: 140 mEq/L (ref 135–145)

## 2011-03-13 LAB — HEMOGLOBIN AND HEMATOCRIT, BLOOD
HCT: 20.6 % — ABNORMAL LOW (ref 36.0–46.0)
HCT: 22.7 % — ABNORMAL LOW (ref 36.0–46.0)
HCT: 29.3 % — ABNORMAL LOW (ref 36.0–46.0)
Hemoglobin: 6.9 g/dL — CL (ref 12.0–15.0)
Hemoglobin: 7.5 g/dL — ABNORMAL LOW (ref 12.0–15.0)
Hemoglobin: 9.9 g/dL — ABNORMAL LOW (ref 12.0–15.0)

## 2011-03-13 LAB — TYPE AND SCREEN
ABO/RH(D): O POS
Antibody Screen: NEGATIVE

## 2011-03-13 LAB — FERRITIN: Ferritin: 1074 ng/mL — ABNORMAL HIGH (ref 10–291)

## 2011-03-13 LAB — IRON AND TIBC
Iron: 107 ug/dL (ref 42–135)
Saturation Ratios: 54 % (ref 20–55)
TIBC: 200 ug/dL — ABNORMAL LOW (ref 250–470)
UIBC: 93 ug/dL

## 2011-03-13 LAB — CARDIAC PANEL(CRET KIN+CKTOT+MB+TROPI)
CK, MB: 1.1 ng/mL (ref 0.3–4.0)
CK, MB: 1.2 ng/mL (ref 0.3–4.0)
Relative Index: INVALID (ref 0.0–2.5)
Relative Index: INVALID (ref 0.0–2.5)
Total CK: 64 U/L (ref 7–177)
Total CK: 70 U/L (ref 7–177)
Troponin I: 0.05 ng/mL (ref 0.00–0.06)
Troponin I: 0.05 ng/mL (ref 0.00–0.06)

## 2011-03-13 LAB — TSH: TSH: 1.114 u[IU]/mL (ref 0.350–4.500)

## 2011-03-13 LAB — LACTATE DEHYDROGENASE: LDH: 129 U/L (ref 94–250)

## 2011-03-13 LAB — PROTIME-INR
INR: 0.95 (ref 0.00–1.49)
Prothrombin Time: 12.6 seconds (ref 11.6–15.2)

## 2011-03-13 LAB — CK TOTAL AND CKMB (NOT AT ARMC)
CK, MB: 0.8 ng/mL (ref 0.3–4.0)
Relative Index: INVALID (ref 0.0–2.5)
Total CK: 72 U/L (ref 7–177)

## 2011-03-13 LAB — FOLATE: Folate: 9.6 ng/mL

## 2011-03-13 LAB — LACTIC ACID, PLASMA: Lactic Acid, Venous: 0.7 mmol/L (ref 0.5–2.2)

## 2011-03-13 LAB — GLUCOSE, CAPILLARY
Glucose-Capillary: 150 mg/dL — ABNORMAL HIGH (ref 70–99)
Glucose-Capillary: 211 mg/dL — ABNORMAL HIGH (ref 70–99)

## 2011-03-13 LAB — APTT: aPTT: 29 seconds (ref 24–37)

## 2011-03-13 LAB — BRAIN NATRIURETIC PEPTIDE: Pro B Natriuretic peptide (BNP): 120 pg/mL — ABNORMAL HIGH (ref 0.0–100.0)

## 2011-03-13 LAB — HEMOCCULT GUIAC POC 1CARD (OFFICE): Fecal Occult Bld: NEGATIVE

## 2011-03-13 LAB — VITAMIN B12: Vitamin B-12: 230 pg/mL (ref 211–911)

## 2011-03-14 LAB — CULTURE, BLOOD (ROUTINE X 2)
Culture: NO GROWTH
Culture: NO GROWTH
Report Status: 2112011
Report Status: 2112011

## 2011-03-14 LAB — DIFFERENTIAL
Basophils Absolute: 0 10*3/uL (ref 0.0–0.1)
Basophils Relative: 0 % (ref 0–1)
Eosinophils Absolute: 0.1 10*3/uL (ref 0.0–0.7)
Eosinophils Relative: 1 % (ref 0–5)
Lymphocytes Relative: 16 % (ref 12–46)
Lymphs Abs: 1.8 10*3/uL (ref 0.7–4.0)
Monocytes Absolute: 0.4 10*3/uL (ref 0.1–1.0)
Monocytes Relative: 4 % (ref 3–12)
Neutro Abs: 8.9 10*3/uL — ABNORMAL HIGH (ref 1.7–7.7)
Neutrophils Relative %: 79 % — ABNORMAL HIGH (ref 43–77)

## 2011-03-14 LAB — CBC
HCT: 23.1 % — ABNORMAL LOW (ref 36.0–46.0)
Hemoglobin: 7.3 g/dL — ABNORMAL LOW (ref 12.0–15.0)
MCHC: 31.7 g/dL (ref 30.0–36.0)
MCV: 80.5 fL (ref 78.0–100.0)
Platelets: 371 10*3/uL (ref 150–400)
RBC: 2.87 MIL/uL — ABNORMAL LOW (ref 3.87–5.11)
RDW: 15.2 % (ref 11.5–15.5)
WBC: 11.2 10*3/uL — ABNORMAL HIGH (ref 4.0–10.5)

## 2011-03-14 LAB — BASIC METABOLIC PANEL
BUN: 35 mg/dL — ABNORMAL HIGH (ref 6–23)
CO2: 23 mEq/L (ref 19–32)
Calcium: 9.1 mg/dL (ref 8.4–10.5)
Chloride: 102 mEq/L (ref 96–112)
Creatinine, Ser: 4.38 mg/dL — ABNORMAL HIGH (ref 0.4–1.2)
GFR calc Af Amer: 14 mL/min — ABNORMAL LOW (ref >60)
GFR calc non Af Amer: 11 mL/min — ABNORMAL LOW (ref >60)
Glucose, Bld: 80 mg/dL (ref 70–99)
Potassium: 3 mEq/L — ABNORMAL LOW (ref 3.5–5.1)
Sodium: 138 mEq/L (ref 135–145)

## 2011-03-19 LAB — BASIC METABOLIC PANEL
BUN: 35 mg/dL — ABNORMAL HIGH (ref 6–23)
BUN: 37 mg/dL — ABNORMAL HIGH (ref 6–23)
BUN: 41 mg/dL — ABNORMAL HIGH (ref 6–23)
BUN: 41 mg/dL — ABNORMAL HIGH (ref 6–23)
BUN: 45 mg/dL — ABNORMAL HIGH (ref 6–23)
CO2: 20 mEq/L (ref 19–32)
CO2: 22 mEq/L (ref 19–32)
CO2: 23 mEq/L (ref 19–32)
CO2: 23 mEq/L (ref 19–32)
CO2: 24 mEq/L (ref 19–32)
Calcium: 7.9 mg/dL — ABNORMAL LOW (ref 8.4–10.5)
Calcium: 8.1 mg/dL — ABNORMAL LOW (ref 8.4–10.5)
Calcium: 8.3 mg/dL — ABNORMAL LOW (ref 8.4–10.5)
Calcium: 8.3 mg/dL — ABNORMAL LOW (ref 8.4–10.5)
Calcium: 8.4 mg/dL (ref 8.4–10.5)
Chloride: 102 mEq/L (ref 96–112)
Chloride: 104 mEq/L (ref 96–112)
Chloride: 105 mEq/L (ref 96–112)
Chloride: 106 mEq/L (ref 96–112)
Chloride: 107 mEq/L (ref 96–112)
Creatinine, Ser: 4.64 mg/dL — ABNORMAL HIGH (ref 0.4–1.2)
Creatinine, Ser: 4.65 mg/dL — ABNORMAL HIGH (ref 0.4–1.2)
Creatinine, Ser: 4.9 mg/dL — ABNORMAL HIGH (ref 0.4–1.2)
Creatinine, Ser: 4.92 mg/dL — ABNORMAL HIGH (ref 0.4–1.2)
Creatinine, Ser: 5.11 mg/dL — ABNORMAL HIGH (ref 0.4–1.2)
GFR calc Af Amer: 12 mL/min — ABNORMAL LOW (ref >60)
GFR calc Af Amer: 12 mL/min — ABNORMAL LOW (ref >60)
GFR calc Af Amer: 12 mL/min — ABNORMAL LOW (ref >60)
GFR calc Af Amer: 13 mL/min — ABNORMAL LOW (ref >60)
GFR calc Af Amer: 13 mL/min — ABNORMAL LOW (ref >60)
GFR calc non Af Amer: 10 mL/min — ABNORMAL LOW (ref >60)
GFR calc non Af Amer: 10 mL/min — ABNORMAL LOW (ref >60)
GFR calc non Af Amer: 10 mL/min — ABNORMAL LOW (ref >60)
GFR calc non Af Amer: 11 mL/min — ABNORMAL LOW (ref >60)
GFR calc non Af Amer: 11 mL/min — ABNORMAL LOW (ref >60)
Glucose, Bld: 135 mg/dL — ABNORMAL HIGH (ref 70–99)
Glucose, Bld: 53 mg/dL — ABNORMAL LOW (ref 70–99)
Glucose, Bld: 56 mg/dL — ABNORMAL LOW (ref 70–99)
Glucose, Bld: 65 mg/dL — ABNORMAL LOW (ref 70–99)
Glucose, Bld: 75 mg/dL (ref 70–99)
Potassium: 3.6 mEq/L (ref 3.5–5.1)
Potassium: 3.7 mEq/L (ref 3.5–5.1)
Potassium: 3.7 mEq/L (ref 3.5–5.1)
Potassium: 3.7 mEq/L (ref 3.5–5.1)
Potassium: 3.9 mEq/L (ref 3.5–5.1)
Sodium: 134 mEq/L — ABNORMAL LOW (ref 135–145)
Sodium: 137 mEq/L (ref 135–145)
Sodium: 138 mEq/L (ref 135–145)
Sodium: 139 mEq/L (ref 135–145)
Sodium: 140 mEq/L (ref 135–145)

## 2011-03-19 LAB — CBC
HCT: 21.1 % — ABNORMAL LOW (ref 36.0–46.0)
HCT: 21.1 % — ABNORMAL LOW (ref 36.0–46.0)
HCT: 21.5 % — ABNORMAL LOW (ref 36.0–46.0)
HCT: 25.9 % — ABNORMAL LOW (ref 36.0–46.0)
HCT: 27.3 % — ABNORMAL LOW (ref 36.0–46.0)
HCT: 27.5 % — ABNORMAL LOW (ref 36.0–46.0)
Hemoglobin: 6.8 g/dL — CL (ref 12.0–15.0)
Hemoglobin: 6.9 g/dL — CL (ref 12.0–15.0)
Hemoglobin: 6.9 g/dL — CL (ref 12.0–15.0)
Hemoglobin: 8.7 g/dL — ABNORMAL LOW (ref 12.0–15.0)
Hemoglobin: 9 g/dL — ABNORMAL LOW (ref 12.0–15.0)
Hemoglobin: 9.3 g/dL — ABNORMAL LOW (ref 12.0–15.0)
MCHC: 32.1 g/dL (ref 30.0–36.0)
MCHC: 32.3 g/dL (ref 30.0–36.0)
MCHC: 32.5 g/dL (ref 30.0–36.0)
MCHC: 33 g/dL (ref 30.0–36.0)
MCHC: 33.6 g/dL (ref 30.0–36.0)
MCHC: 33.7 g/dL (ref 30.0–36.0)
MCV: 80.6 fL (ref 78.0–100.0)
MCV: 81.4 fL (ref 78.0–100.0)
MCV: 82 fL (ref 78.0–100.0)
MCV: 83.5 fL (ref 78.0–100.0)
MCV: 83.6 fL (ref 78.0–100.0)
MCV: 84.7 fL (ref 78.0–100.0)
Platelets: 263 10*3/uL (ref 150–400)
Platelets: 275 10*3/uL (ref 150–400)
Platelets: 280 10*3/uL (ref 150–400)
Platelets: 284 10*3/uL (ref 150–400)
Platelets: 289 10*3/uL (ref 150–400)
Platelets: 322 10*3/uL (ref 150–400)
RBC: 2.6 MIL/uL — ABNORMAL LOW (ref 3.87–5.11)
RBC: 2.62 MIL/uL — ABNORMAL LOW (ref 3.87–5.11)
RBC: 2.62 MIL/uL — ABNORMAL LOW (ref 3.87–5.11)
RBC: 3.09 MIL/uL — ABNORMAL LOW (ref 3.87–5.11)
RBC: 3.22 MIL/uL — ABNORMAL LOW (ref 3.87–5.11)
RBC: 3.3 MIL/uL — ABNORMAL LOW (ref 3.87–5.11)
RDW: 14.5 % (ref 11.5–15.5)
RDW: 15.1 % (ref 11.5–15.5)
RDW: 15.2 % (ref 11.5–15.5)
RDW: 15.4 % (ref 11.5–15.5)
RDW: 15.5 % (ref 11.5–15.5)
RDW: 15.8 % — ABNORMAL HIGH (ref 11.5–15.5)
WBC: 10.2 10*3/uL (ref 4.0–10.5)
WBC: 10.6 10*3/uL — ABNORMAL HIGH (ref 4.0–10.5)
WBC: 11 10*3/uL — ABNORMAL HIGH (ref 4.0–10.5)
WBC: 11.9 10*3/uL — ABNORMAL HIGH (ref 4.0–10.5)
WBC: 9.1 10*3/uL (ref 4.0–10.5)
WBC: 9.9 10*3/uL (ref 4.0–10.5)

## 2011-03-19 LAB — GLUCOSE, CAPILLARY
Glucose-Capillary: 100 mg/dL — ABNORMAL HIGH (ref 70–99)
Glucose-Capillary: 104 mg/dL — ABNORMAL HIGH (ref 70–99)
Glucose-Capillary: 107 mg/dL — ABNORMAL HIGH (ref 70–99)
Glucose-Capillary: 108 mg/dL — ABNORMAL HIGH (ref 70–99)
Glucose-Capillary: 110 mg/dL — ABNORMAL HIGH (ref 70–99)
Glucose-Capillary: 117 mg/dL — ABNORMAL HIGH (ref 70–99)
Glucose-Capillary: 127 mg/dL — ABNORMAL HIGH (ref 70–99)
Glucose-Capillary: 127 mg/dL — ABNORMAL HIGH (ref 70–99)
Glucose-Capillary: 129 mg/dL — ABNORMAL HIGH (ref 70–99)
Glucose-Capillary: 144 mg/dL — ABNORMAL HIGH (ref 70–99)
Glucose-Capillary: 165 mg/dL — ABNORMAL HIGH (ref 70–99)
Glucose-Capillary: 174 mg/dL — ABNORMAL HIGH (ref 70–99)
Glucose-Capillary: 183 mg/dL — ABNORMAL HIGH (ref 70–99)
Glucose-Capillary: 196 mg/dL — ABNORMAL HIGH (ref 70–99)
Glucose-Capillary: 206 mg/dL — ABNORMAL HIGH (ref 70–99)
Glucose-Capillary: 209 mg/dL — ABNORMAL HIGH (ref 70–99)
Glucose-Capillary: 53 mg/dL — ABNORMAL LOW (ref 70–99)
Glucose-Capillary: 57 mg/dL — ABNORMAL LOW (ref 70–99)
Glucose-Capillary: 67 mg/dL — ABNORMAL LOW (ref 70–99)
Glucose-Capillary: 71 mg/dL (ref 70–99)
Glucose-Capillary: 75 mg/dL (ref 70–99)
Glucose-Capillary: 76 mg/dL (ref 70–99)
Glucose-Capillary: 78 mg/dL (ref 70–99)
Glucose-Capillary: 79 mg/dL (ref 70–99)
Glucose-Capillary: 82 mg/dL (ref 70–99)
Glucose-Capillary: 87 mg/dL (ref 70–99)

## 2011-03-19 LAB — CULTURE, BLOOD (ROUTINE X 2)
Culture: NO GROWTH
Culture: NO GROWTH

## 2011-03-19 LAB — TYPE AND SCREEN
ABO/RH(D): O POS
Antibody Screen: NEGATIVE

## 2011-03-19 LAB — COMPREHENSIVE METABOLIC PANEL
ALT: <8 U/L (ref 0–35)
AST: 8 U/L (ref 0–37)
Albumin: 3.3 g/dL — ABNORMAL LOW (ref 3.5–5.2)
Alkaline Phosphatase: 41 U/L (ref 39–117)
BUN: 46 mg/dL — ABNORMAL HIGH (ref 6–23)
CO2: 22 mEq/L (ref 19–32)
Calcium: 7.8 mg/dL — ABNORMAL LOW (ref 8.4–10.5)
Chloride: 109 mEq/L (ref 96–112)
Creatinine, Ser: 4.8 mg/dL — ABNORMAL HIGH (ref 0.4–1.2)
GFR calc Af Amer: 12 mL/min — ABNORMAL LOW (ref >60)
GFR calc non Af Amer: 10 mL/min — ABNORMAL LOW (ref >60)
Glucose, Bld: 118 mg/dL — ABNORMAL HIGH (ref 70–99)
Potassium: 3.3 mEq/L — ABNORMAL LOW (ref 3.5–5.1)
Sodium: 140 mEq/L (ref 135–145)
Total Bilirubin: 0.3 mg/dL (ref 0.3–1.2)
Total Protein: 6.1 g/dL (ref 6.0–8.3)

## 2011-03-19 LAB — PHOSPHORUS
Phosphorus: 5.7 mg/dL — ABNORMAL HIGH (ref 2.3–4.6)
Phosphorus: 6.2 mg/dL — ABNORMAL HIGH (ref 2.3–4.6)
Phosphorus: 6.6 mg/dL — ABNORMAL HIGH (ref 2.3–4.6)

## 2011-03-19 LAB — IRON: Iron: 70 ug/dL (ref 42–135)

## 2011-03-19 LAB — URIC ACID: Uric Acid, Serum: 8 mg/dL — ABNORMAL HIGH (ref 2.4–7.0)

## 2011-03-19 LAB — BRAIN NATRIURETIC PEPTIDE: Pro B Natriuretic peptide (BNP): 135 pg/mL — ABNORMAL HIGH (ref 0.0–100.0)

## 2011-03-19 LAB — DIFFERENTIAL
Basophils Absolute: 0 10*3/uL (ref 0.0–0.1)
Basophils Relative: 0 % (ref 0–1)
Eosinophils Absolute: 0.2 10*3/uL (ref 0.0–0.7)
Eosinophils Relative: 2 % (ref 0–5)
Lymphocytes Relative: 5 % — ABNORMAL LOW (ref 12–46)
Lymphs Abs: 0.6 10*3/uL — ABNORMAL LOW (ref 0.7–4.0)
Monocytes Absolute: 0.2 10*3/uL (ref 0.1–1.0)
Monocytes Relative: 2 % — ABNORMAL LOW (ref 3–12)
Neutro Abs: 10.9 10*3/uL — ABNORMAL HIGH (ref 1.7–7.7)
Neutrophils Relative %: 92 % — ABNORMAL HIGH (ref 43–77)

## 2011-03-19 LAB — PREPARE RBC (CROSSMATCH)

## 2011-03-19 LAB — HEMOGLOBIN A1C
Hgb A1c MFr Bld: 6.4 % — ABNORMAL HIGH (ref 4.6–6.1)
Mean Plasma Glucose: 137 mg/dL

## 2011-03-19 LAB — TACROLIMUS LEVEL: Tacrolimus (FK506) - LabCorp: 4.5 ng/mL

## 2011-03-19 LAB — FOLATE RBC: RBC Folate: 475 ng/mL (ref 180–600)

## 2011-03-27 ENCOUNTER — Encounter: Payer: Self-pay | Admitting: Family Medicine

## 2011-03-27 DIAGNOSIS — I10 Essential (primary) hypertension: Secondary | ICD-10-CM

## 2011-03-27 DIAGNOSIS — F4001 Agoraphobia with panic disorder: Secondary | ICD-10-CM | POA: Insufficient documentation

## 2011-03-27 DIAGNOSIS — F32A Depression, unspecified: Secondary | ICD-10-CM

## 2011-03-27 DIAGNOSIS — K5090 Crohn's disease, unspecified, without complications: Secondary | ICD-10-CM | POA: Insufficient documentation

## 2011-03-27 DIAGNOSIS — E119 Type 2 diabetes mellitus without complications: Secondary | ICD-10-CM | POA: Insufficient documentation

## 2011-03-27 DIAGNOSIS — D649 Anemia, unspecified: Secondary | ICD-10-CM

## 2011-03-27 DIAGNOSIS — M1711 Unilateral primary osteoarthritis, right knee: Secondary | ICD-10-CM | POA: Insufficient documentation

## 2011-03-27 DIAGNOSIS — N186 End stage renal disease: Secondary | ICD-10-CM | POA: Insufficient documentation

## 2011-03-27 DIAGNOSIS — IMO0002 Reserved for concepts with insufficient information to code with codable children: Secondary | ICD-10-CM | POA: Insufficient documentation

## 2011-03-27 DIAGNOSIS — M545 Low back pain, unspecified: Secondary | ICD-10-CM | POA: Insufficient documentation

## 2011-03-27 DIAGNOSIS — Z940 Kidney transplant status: Secondary | ICD-10-CM | POA: Insufficient documentation

## 2011-03-27 DIAGNOSIS — F429 Obsessive-compulsive disorder, unspecified: Secondary | ICD-10-CM

## 2011-03-27 DIAGNOSIS — F329 Major depressive disorder, single episode, unspecified: Secondary | ICD-10-CM | POA: Insufficient documentation

## 2011-03-27 DIAGNOSIS — IMO0001 Reserved for inherently not codable concepts without codable children: Secondary | ICD-10-CM

## 2011-03-27 DIAGNOSIS — E785 Hyperlipidemia, unspecified: Secondary | ICD-10-CM | POA: Insufficient documentation

## 2011-03-27 DIAGNOSIS — G4733 Obstructive sleep apnea (adult) (pediatric): Secondary | ICD-10-CM | POA: Insufficient documentation

## 2011-03-27 DIAGNOSIS — F411 Generalized anxiety disorder: Secondary | ICD-10-CM | POA: Insufficient documentation

## 2011-03-28 LAB — BASIC METABOLIC PANEL
BUN: 40 mg/dL — ABNORMAL HIGH (ref 6–23)
CO2: 25 mEq/L (ref 19–32)
Calcium: 9.8 mg/dL (ref 8.4–10.5)
Chloride: 102 mEq/L (ref 96–112)
Creatinine, Ser: 4.28 mg/dL — ABNORMAL HIGH (ref 0.4–1.2)
GFR calc Af Amer: 14 mL/min — ABNORMAL LOW (ref >60)
GFR calc non Af Amer: 12 mL/min — ABNORMAL LOW (ref >60)
Glucose, Bld: 92 mg/dL (ref 70–99)
Potassium: 3.8 mEq/L (ref 3.5–5.1)
Sodium: 137 mEq/L (ref 135–145)

## 2011-03-28 LAB — URINALYSIS, ROUTINE W REFLEX MICROSCOPIC
Bilirubin Urine: NEGATIVE
Glucose, UA: NEGATIVE mg/dL
Ketones, ur: NEGATIVE mg/dL
Leukocytes, UA: NEGATIVE
Nitrite: NEGATIVE
Protein, ur: 100 mg/dL — AB
Specific Gravity, Urine: 1.015 (ref 1.005–1.030)
Urobilinogen, UA: 0.2 mg/dL (ref 0.0–1.0)
pH: 6 (ref 5.0–8.0)

## 2011-03-28 LAB — CBC
HCT: 27.9 % — ABNORMAL LOW (ref 36.0–46.0)
Hemoglobin: 9.1 g/dL — ABNORMAL LOW (ref 12.0–15.0)
MCHC: 32.7 g/dL (ref 30.0–36.0)
MCV: 82.1 fL (ref 78.0–100.0)
Platelets: 285 10*3/uL (ref 150–400)
RBC: 3.39 MIL/uL — ABNORMAL LOW (ref 3.87–5.11)
RDW: 15 % (ref 11.5–15.5)
WBC: 8.8 10*3/uL (ref 4.0–10.5)

## 2011-03-28 LAB — URINE MICROSCOPIC-ADD ON

## 2011-03-28 LAB — DIFFERENTIAL
Basophils Absolute: 0 10*3/uL (ref 0.0–0.1)
Basophils Relative: 0 % (ref 0–1)
Eosinophils Absolute: 0.1 10*3/uL (ref 0.0–0.7)
Eosinophils Relative: 1 % (ref 0–5)
Lymphocytes Relative: 18 % (ref 12–46)
Lymphs Abs: 1.6 10*3/uL (ref 0.7–4.0)
Monocytes Absolute: 0.1 10*3/uL (ref 0.1–1.0)
Monocytes Relative: 1 % — ABNORMAL LOW (ref 3–12)
Neutro Abs: 7 10*3/uL (ref 1.7–7.7)
Neutrophils Relative %: 81 % — ABNORMAL HIGH (ref 43–77)

## 2011-03-28 LAB — URINE CULTURE: Colony Count: 50000

## 2011-03-28 LAB — HEPATIC FUNCTION PANEL
ALT: <8 U/L (ref 0–35)
AST: 10 U/L (ref 0–37)
Albumin: 3.9 g/dL (ref 3.5–5.2)
Alkaline Phosphatase: 55 U/L (ref 39–117)
Bilirubin, Direct: 0.1 mg/dL (ref 0.0–0.3)
Indirect Bilirubin: 0.2 mg/dL — ABNORMAL LOW (ref 0.3–0.9)
Total Bilirubin: 0.3 mg/dL (ref 0.3–1.2)
Total Protein: 7.4 g/dL (ref 6.0–8.3)

## 2011-03-29 ENCOUNTER — Encounter (HOSPITAL_BASED_OUTPATIENT_CLINIC_OR_DEPARTMENT_OTHER): Payer: Medicare Other | Attending: Internal Medicine

## 2011-03-29 ENCOUNTER — Other Ambulatory Visit (HOSPITAL_BASED_OUTPATIENT_CLINIC_OR_DEPARTMENT_OTHER): Payer: Self-pay | Admitting: Internal Medicine

## 2011-03-29 DIAGNOSIS — Z905 Acquired absence of kidney: Secondary | ICD-10-CM | POA: Insufficient documentation

## 2011-03-29 DIAGNOSIS — T25039A Burn of unspecified degree of unspecified toe(s) (nail), initial encounter: Secondary | ICD-10-CM | POA: Insufficient documentation

## 2011-03-29 DIAGNOSIS — Z79899 Other long term (current) drug therapy: Secondary | ICD-10-CM | POA: Insufficient documentation

## 2011-03-29 DIAGNOSIS — T310 Burns involving less than 10% of body surface: Secondary | ICD-10-CM | POA: Insufficient documentation

## 2011-03-29 DIAGNOSIS — E119 Type 2 diabetes mellitus without complications: Secondary | ICD-10-CM | POA: Insufficient documentation

## 2011-03-29 DIAGNOSIS — T5494XA Toxic effect of unspecified corrosive substance, undetermined, initial encounter: Secondary | ICD-10-CM | POA: Insufficient documentation

## 2011-03-29 LAB — URINE CULTURE: Colony Count: 10000

## 2011-03-29 LAB — BASIC METABOLIC PANEL
BUN: 33 mg/dL — ABNORMAL HIGH (ref 6–23)
CO2: 24 mEq/L (ref 19–32)
Calcium: 10.9 mg/dL — ABNORMAL HIGH (ref 8.4–10.5)
Chloride: 104 mEq/L (ref 96–112)
Creatinine, Ser: 4.79 mg/dL — ABNORMAL HIGH (ref 0.4–1.2)
GFR calc Af Amer: 13 mL/min — ABNORMAL LOW (ref >60)
GFR calc non Af Amer: 10 mL/min — ABNORMAL LOW (ref >60)
Glucose, Bld: 80 mg/dL (ref 70–99)
Potassium: 4 mEq/L (ref 3.5–5.1)
Sodium: 136 mEq/L (ref 135–145)

## 2011-03-29 LAB — URINALYSIS, ROUTINE W REFLEX MICROSCOPIC
Bilirubin Urine: NEGATIVE
Glucose, UA: NEGATIVE mg/dL
Ketones, ur: NEGATIVE mg/dL
Leukocytes, UA: NEGATIVE
Nitrite: NEGATIVE
Protein, ur: 30 mg/dL — AB
Specific Gravity, Urine: 1.01 (ref 1.005–1.030)
Urobilinogen, UA: 0.2 mg/dL (ref 0.0–1.0)
pH: 6 (ref 5.0–8.0)

## 2011-03-29 LAB — CBC
HCT: 25.6 % — ABNORMAL LOW (ref 36.0–46.0)
Hemoglobin: 8.4 g/dL — ABNORMAL LOW (ref 12.0–15.0)
MCHC: 32.9 g/dL (ref 30.0–36.0)
MCV: 82.6 fL (ref 78.0–100.0)
Platelets: 360 10*3/uL (ref 150–400)
RBC: 3.1 MIL/uL — ABNORMAL LOW (ref 3.87–5.11)
RDW: 16 % — ABNORMAL HIGH (ref 11.5–15.5)
WBC: 9.5 10*3/uL (ref 4.0–10.5)

## 2011-03-29 LAB — HEPATIC FUNCTION PANEL
ALT: 20 U/L (ref 0–35)
AST: 11 U/L (ref 0–37)
Albumin: 3.8 g/dL (ref 3.5–5.2)
Alkaline Phosphatase: 56 U/L (ref 39–117)
Bilirubin, Direct: 0.1 mg/dL (ref 0.0–0.3)
Indirect Bilirubin: 0.3 mg/dL (ref 0.3–0.9)
Total Bilirubin: 0.4 mg/dL (ref 0.3–1.2)
Total Protein: 6.8 g/dL (ref 6.0–8.3)

## 2011-03-29 LAB — DIFFERENTIAL
Basophils Absolute: 0 10*3/uL (ref 0.0–0.1)
Basophils Relative: 0 % (ref 0–1)
Eosinophils Absolute: 0.3 10*3/uL (ref 0.0–0.7)
Eosinophils Relative: 4 % (ref 0–5)
Lymphocytes Relative: 17 % (ref 12–46)
Lymphs Abs: 1.6 10*3/uL (ref 0.7–4.0)
Monocytes Absolute: 0.4 10*3/uL (ref 0.1–1.0)
Monocytes Relative: 4 % (ref 3–12)
Neutro Abs: 7.2 10*3/uL (ref 1.7–7.7)
Neutrophils Relative %: 76 % (ref 43–77)

## 2011-03-29 LAB — URINE MICROSCOPIC-ADD ON

## 2011-03-29 LAB — GLUCOSE, CAPILLARY: Glucose-Capillary: 126 mg/dL — ABNORMAL HIGH (ref 70–99)

## 2011-03-29 LAB — MAGNESIUM: Magnesium: 1.9 mg/dL (ref 1.5–2.5)

## 2011-03-30 LAB — URINE MICROSCOPIC-ADD ON

## 2011-03-30 LAB — DIFFERENTIAL
Basophils Absolute: 0 10*3/uL (ref 0.0–0.1)
Basophils Relative: 0 % (ref 0–1)
Eosinophils Absolute: 0.1 10*3/uL (ref 0.0–0.7)
Eosinophils Relative: 1 % (ref 0–5)
Lymphocytes Relative: 19 % (ref 12–46)
Lymphs Abs: 1.9 10*3/uL (ref 0.7–4.0)
Monocytes Absolute: 0.5 10*3/uL (ref 0.1–1.0)
Monocytes Relative: 5 % (ref 3–12)
Neutro Abs: 7.7 10*3/uL (ref 1.7–7.7)
Neutrophils Relative %: 76 % (ref 43–77)

## 2011-03-30 LAB — BASIC METABOLIC PANEL
BUN: 31 mg/dL — ABNORMAL HIGH (ref 6–23)
CO2: 25 mEq/L (ref 19–32)
Calcium: 10.4 mg/dL (ref 8.4–10.5)
Chloride: 103 mEq/L (ref 96–112)
Creatinine, Ser: 5.12 mg/dL — ABNORMAL HIGH (ref 0.4–1.2)
GFR calc Af Amer: 12 mL/min — ABNORMAL LOW (ref >60)
GFR calc non Af Amer: 10 mL/min — ABNORMAL LOW (ref >60)
Glucose, Bld: 169 mg/dL — ABNORMAL HIGH (ref 70–99)
Potassium: 3.8 mEq/L (ref 3.5–5.1)
Sodium: 139 mEq/L (ref 135–145)

## 2011-03-30 LAB — URINALYSIS, ROUTINE W REFLEX MICROSCOPIC
Bilirubin Urine: NEGATIVE
Glucose, UA: 100 mg/dL — AB
Ketones, ur: NEGATIVE mg/dL
Leukocytes, UA: NEGATIVE
Nitrite: NEGATIVE
Protein, ur: 100 mg/dL — AB
Specific Gravity, Urine: 1.015 (ref 1.005–1.030)
Urobilinogen, UA: 0.2 mg/dL (ref 0.0–1.0)
pH: 6 (ref 5.0–8.0)

## 2011-03-30 LAB — CBC
HCT: 26.2 % — ABNORMAL LOW (ref 36.0–46.0)
Hemoglobin: 8.5 g/dL — ABNORMAL LOW (ref 12.0–15.0)
MCHC: 32.4 g/dL (ref 30.0–36.0)
MCV: 82.8 fL (ref 78.0–100.0)
Platelets: 329 10*3/uL (ref 150–400)
RBC: 3.16 MIL/uL — ABNORMAL LOW (ref 3.87–5.11)
RDW: 15.6 % — ABNORMAL HIGH (ref 11.5–15.5)
WBC: 10.2 10*3/uL (ref 4.0–10.5)

## 2011-04-02 LAB — BASIC METABOLIC PANEL
BUN: 44 mg/dL — ABNORMAL HIGH (ref 6–23)
BUN: 38 mg/dL — ABNORMAL HIGH (ref 6–23)
BUN: 50 mg/dL — ABNORMAL HIGH (ref 6–23)
CO2: 26 mEq/L (ref 19–32)
CO2: 20 mEq/L (ref 19–32)
CO2: 24 mEq/L (ref 19–32)
Calcium: 10.4 mg/dL (ref 8.4–10.5)
Calcium: 11.1 mg/dL — ABNORMAL HIGH (ref 8.4–10.5)
Calcium: 11.2 mg/dL — ABNORMAL HIGH (ref 8.4–10.5)
Chloride: 107 mEq/L (ref 96–112)
Chloride: 108 mEq/L (ref 96–112)
Chloride: 110 mEq/L (ref 96–112)
Creatinine, Ser: 4.96 mg/dL — ABNORMAL HIGH (ref 0.4–1.2)
Creatinine, Ser: 5.08 mg/dL — ABNORMAL HIGH (ref 0.4–1.2)
Creatinine, Ser: 5.17 mg/dL — ABNORMAL HIGH (ref 0.4–1.2)
GFR calc Af Amer: 12 mL/min — ABNORMAL LOW (ref >60)
GFR calc Af Amer: 11 mL/min — ABNORMAL LOW (ref >60)
GFR calc Af Amer: 12 mL/min — ABNORMAL LOW (ref >60)
GFR calc non Af Amer: 10 mL/min — ABNORMAL LOW (ref >60)
GFR calc non Af Amer: 10 mL/min — ABNORMAL LOW (ref >60)
GFR calc non Af Amer: 9 mL/min — ABNORMAL LOW (ref >60)
Glucose, Bld: 99 mg/dL (ref 70–99)
Glucose, Bld: 74 mg/dL (ref 70–99)
Glucose, Bld: 97 mg/dL (ref 70–99)
Potassium: 3.3 mEq/L — ABNORMAL LOW (ref 3.5–5.1)
Potassium: 4.1 mEq/L (ref 3.5–5.1)
Potassium: 4.8 mEq/L (ref 3.5–5.1)
Sodium: 138 mEq/L (ref 135–145)
Sodium: 139 mEq/L (ref 135–145)
Sodium: 141 mEq/L (ref 135–145)

## 2011-04-02 LAB — GLUCOSE, CAPILLARY
Glucose-Capillary: 101 mg/dL — ABNORMAL HIGH (ref 70–99)
Glucose-Capillary: 101 mg/dL — ABNORMAL HIGH (ref 70–99)
Glucose-Capillary: 104 mg/dL — ABNORMAL HIGH (ref 70–99)
Glucose-Capillary: 105 mg/dL — ABNORMAL HIGH (ref 70–99)
Glucose-Capillary: 108 mg/dL — ABNORMAL HIGH (ref 70–99)
Glucose-Capillary: 110 mg/dL — ABNORMAL HIGH (ref 70–99)
Glucose-Capillary: 112 mg/dL — ABNORMAL HIGH (ref 70–99)
Glucose-Capillary: 115 mg/dL — ABNORMAL HIGH (ref 70–99)
Glucose-Capillary: 117 mg/dL — ABNORMAL HIGH (ref 70–99)
Glucose-Capillary: 117 mg/dL — ABNORMAL HIGH (ref 70–99)
Glucose-Capillary: 120 mg/dL — ABNORMAL HIGH (ref 70–99)
Glucose-Capillary: 125 mg/dL — ABNORMAL HIGH (ref 70–99)
Glucose-Capillary: 127 mg/dL — ABNORMAL HIGH (ref 70–99)
Glucose-Capillary: 128 mg/dL — ABNORMAL HIGH (ref 70–99)
Glucose-Capillary: 133 mg/dL — ABNORMAL HIGH (ref 70–99)
Glucose-Capillary: 136 mg/dL — ABNORMAL HIGH (ref 70–99)
Glucose-Capillary: 136 mg/dL — ABNORMAL HIGH (ref 70–99)
Glucose-Capillary: 141 mg/dL — ABNORMAL HIGH (ref 70–99)
Glucose-Capillary: 149 mg/dL — ABNORMAL HIGH (ref 70–99)
Glucose-Capillary: 152 mg/dL — ABNORMAL HIGH (ref 70–99)
Glucose-Capillary: 166 mg/dL — ABNORMAL HIGH (ref 70–99)
Glucose-Capillary: 182 mg/dL — ABNORMAL HIGH (ref 70–99)
Glucose-Capillary: 201 mg/dL — ABNORMAL HIGH (ref 70–99)
Glucose-Capillary: 242 mg/dL — ABNORMAL HIGH (ref 70–99)
Glucose-Capillary: 272 mg/dL — ABNORMAL HIGH (ref 70–99)
Glucose-Capillary: 68 mg/dL — ABNORMAL LOW (ref 70–99)
Glucose-Capillary: 69 mg/dL — ABNORMAL LOW (ref 70–99)
Glucose-Capillary: 69 mg/dL — ABNORMAL LOW (ref 70–99)
Glucose-Capillary: 69 mg/dL — ABNORMAL LOW (ref 70–99)
Glucose-Capillary: 72 mg/dL (ref 70–99)
Glucose-Capillary: 74 mg/dL (ref 70–99)
Glucose-Capillary: 75 mg/dL (ref 70–99)
Glucose-Capillary: 80 mg/dL (ref 70–99)
Glucose-Capillary: 82 mg/dL (ref 70–99)
Glucose-Capillary: 83 mg/dL (ref 70–99)
Glucose-Capillary: 86 mg/dL (ref 70–99)
Glucose-Capillary: 88 mg/dL (ref 70–99)
Glucose-Capillary: 91 mg/dL (ref 70–99)
Glucose-Capillary: 98 mg/dL (ref 70–99)

## 2011-04-02 LAB — CBC
HCT: 20.8 % — ABNORMAL LOW (ref 36.0–46.0)
HCT: 23.5 % — ABNORMAL LOW (ref 36.0–46.0)
HCT: 24 % — ABNORMAL LOW (ref 36.0–46.0)
HCT: 24.1 % — ABNORMAL LOW (ref 36.0–46.0)
HCT: 24.5 % — ABNORMAL LOW (ref 36.0–46.0)
HCT: 25.2 % — ABNORMAL LOW (ref 36.0–46.0)
HCT: 25.5 % — ABNORMAL LOW (ref 36.0–46.0)
Hemoglobin: 7 g/dL — CL (ref 12.0–15.0)
Hemoglobin: 7.7 g/dL — CL (ref 12.0–15.0)
Hemoglobin: 7.8 g/dL — CL (ref 12.0–15.0)
Hemoglobin: 7.9 g/dL — CL (ref 12.0–15.0)
Hemoglobin: 7.9 g/dL — CL (ref 12.0–15.0)
Hemoglobin: 8.2 g/dL — ABNORMAL LOW (ref 12.0–15.0)
Hemoglobin: 8.4 g/dL — ABNORMAL LOW (ref 12.0–15.0)
MCHC: 33.7 g/dL (ref 30.0–36.0)
MCHC: 31.9 g/dL (ref 30.0–36.0)
MCHC: 32.2 g/dL (ref 30.0–36.0)
MCHC: 32.5 g/dL (ref 30.0–36.0)
MCHC: 32.8 g/dL (ref 30.0–36.0)
MCHC: 33 g/dL (ref 30.0–36.0)
MCHC: 33.1 g/dL (ref 30.0–36.0)
MCV: 83.6 fL (ref 78.0–100.0)
MCV: 84.1 fL (ref 78.0–100.0)
MCV: 84.6 fL (ref 78.0–100.0)
MCV: 84.6 fL (ref 78.0–100.0)
MCV: 84.9 fL (ref 78.0–100.0)
MCV: 85.1 fL (ref 78.0–100.0)
MCV: 85.3 fL (ref 78.0–100.0)
Platelets: 252 10*3/uL (ref 150–400)
Platelets: 305 10*3/uL (ref 150–400)
Platelets: 307 10*3/uL (ref 150–400)
Platelets: 351 10*3/uL (ref 150–400)
Platelets: 352 10*3/uL (ref 150–400)
Platelets: 353 10*3/uL (ref 150–400)
Platelets: UNDETERMINED 10*3/uL (ref 150–400)
RBC: 2.49 MIL/uL — ABNORMAL LOW (ref 3.87–5.11)
RBC: 2.79 MIL/uL — ABNORMAL LOW (ref 3.87–5.11)
RBC: 2.82 MIL/uL — ABNORMAL LOW (ref 3.87–5.11)
RBC: 2.85 MIL/uL — ABNORMAL LOW (ref 3.87–5.11)
RBC: 2.89 MIL/uL — ABNORMAL LOW (ref 3.87–5.11)
RBC: 2.96 MIL/uL — ABNORMAL LOW (ref 3.87–5.11)
RBC: 3 MIL/uL — ABNORMAL LOW (ref 3.87–5.11)
RDW: 16.5 % — ABNORMAL HIGH (ref 11.5–15.5)
RDW: 17 % — ABNORMAL HIGH (ref 11.5–15.5)
RDW: 17.3 % — ABNORMAL HIGH (ref 11.5–15.5)
RDW: 17.5 % — ABNORMAL HIGH (ref 11.5–15.5)
RDW: 17.7 % — ABNORMAL HIGH (ref 11.5–15.5)
RDW: 18 % — ABNORMAL HIGH (ref 11.5–15.5)
RDW: 18.2 % — ABNORMAL HIGH (ref 11.5–15.5)
WBC: 10.1 10*3/uL (ref 4.0–10.5)
WBC: 10 10*3/uL (ref 4.0–10.5)
WBC: 10.3 10*3/uL (ref 4.0–10.5)
WBC: 10.5 10*3/uL (ref 4.0–10.5)
WBC: 10.6 10*3/uL — ABNORMAL HIGH (ref 4.0–10.5)
WBC: 12 10*3/uL — ABNORMAL HIGH (ref 4.0–10.5)
WBC: 9.9 10*3/uL (ref 4.0–10.5)

## 2011-04-02 LAB — POCT I-STAT, CHEM 8
BUN: 50 mg/dL — ABNORMAL HIGH (ref 6–23)
Calcium, Ion: 1.28 mmol/L (ref 1.12–1.32)
Chloride: 110 mEq/L (ref 96–112)
Creatinine, Ser: 5.1 mg/dL — ABNORMAL HIGH (ref 0.4–1.2)
Glucose, Bld: 324 mg/dL — ABNORMAL HIGH (ref 70–99)
HCT: 25 % — ABNORMAL LOW (ref 36.0–46.0)
Hemoglobin: 8.5 g/dL — ABNORMAL LOW (ref 12.0–15.0)
Potassium: 4.7 mEq/L (ref 3.5–5.1)
Sodium: 133 mEq/L — ABNORMAL LOW (ref 135–145)
TCO2: 16 mmol/L (ref 0–100)

## 2011-04-02 LAB — CARDIAC PANEL(CRET KIN+CKTOT+MB+TROPI)
CK, MB: 1.2 ng/mL (ref 0.3–4.0)
CK, MB: 1.3 ng/mL (ref 0.3–4.0)
Relative Index: INVALID (ref 0.0–2.5)
Relative Index: INVALID (ref 0.0–2.5)
Total CK: 30 U/L (ref 7–177)
Total CK: 36 U/L (ref 7–177)
Troponin I: 0.04 ng/mL (ref 0.00–0.06)
Troponin I: 0.05 ng/mL (ref 0.00–0.06)

## 2011-04-02 LAB — RENAL FUNCTION PANEL
Albumin: 3.5 g/dL (ref 3.5–5.2)
Albumin: 3.5 g/dL (ref 3.5–5.2)
Albumin: 3.7 g/dL (ref 3.5–5.2)
Albumin: 3.9 g/dL (ref 3.5–5.2)
Albumin: 3.9 g/dL (ref 3.5–5.2)
BUN: 42 mg/dL — ABNORMAL HIGH (ref 6–23)
BUN: 49 mg/dL — ABNORMAL HIGH (ref 6–23)
BUN: 51 mg/dL — ABNORMAL HIGH (ref 6–23)
BUN: 53 mg/dL — ABNORMAL HIGH (ref 6–23)
BUN: 61 mg/dL — ABNORMAL HIGH (ref 6–23)
CO2: 21 mEq/L (ref 19–32)
CO2: 22 mEq/L (ref 19–32)
CO2: 23 mEq/L (ref 19–32)
CO2: 25 mEq/L (ref 19–32)
CO2: 26 mEq/L (ref 19–32)
Calcium: 10.9 mg/dL — ABNORMAL HIGH (ref 8.4–10.5)
Calcium: 12 mg/dL — ABNORMAL HIGH (ref 8.4–10.5)
Calcium: 12.3 mg/dL — ABNORMAL HIGH (ref 8.4–10.5)
Calcium: 12.8 mg/dL — ABNORMAL HIGH (ref 8.4–10.5)
Calcium: 13.2 mg/dL (ref 8.4–10.5)
Chloride: 101 mEq/L (ref 96–112)
Chloride: 101 mEq/L (ref 96–112)
Chloride: 105 mEq/L (ref 96–112)
Chloride: 105 mEq/L (ref 96–112)
Chloride: 107 mEq/L (ref 96–112)
Creatinine, Ser: 4.79 mg/dL — ABNORMAL HIGH (ref 0.4–1.2)
Creatinine, Ser: 4.85 mg/dL — ABNORMAL HIGH (ref 0.4–1.2)
Creatinine, Ser: 5.04 mg/dL — ABNORMAL HIGH (ref 0.4–1.2)
Creatinine, Ser: 5.19 mg/dL — ABNORMAL HIGH (ref 0.4–1.2)
Creatinine, Ser: 5.43 mg/dL — ABNORMAL HIGH (ref 0.4–1.2)
GFR calc Af Amer: 11 mL/min — ABNORMAL LOW (ref >60)
GFR calc Af Amer: 11 mL/min — ABNORMAL LOW (ref >60)
GFR calc Af Amer: 12 mL/min — ABNORMAL LOW (ref >60)
GFR calc Af Amer: 12 mL/min — ABNORMAL LOW (ref >60)
GFR calc Af Amer: 13 mL/min — ABNORMAL LOW (ref >60)
GFR calc non Af Amer: 10 mL/min — ABNORMAL LOW (ref >60)
GFR calc non Af Amer: 10 mL/min — ABNORMAL LOW (ref >60)
GFR calc non Af Amer: 10 mL/min — ABNORMAL LOW (ref >60)
GFR calc non Af Amer: 9 mL/min — ABNORMAL LOW (ref >60)
GFR calc non Af Amer: 9 mL/min — ABNORMAL LOW (ref >60)
Glucose, Bld: 115 mg/dL — ABNORMAL HIGH (ref 70–99)
Glucose, Bld: 71 mg/dL (ref 70–99)
Glucose, Bld: 85 mg/dL (ref 70–99)
Glucose, Bld: 91 mg/dL (ref 70–99)
Glucose, Bld: 99 mg/dL (ref 70–99)
Phosphorus: 6.2 mg/dL — ABNORMAL HIGH (ref 2.3–4.6)
Phosphorus: 6.6 mg/dL — ABNORMAL HIGH (ref 2.3–4.6)
Phosphorus: 6.8 mg/dL — ABNORMAL HIGH (ref 2.3–4.6)
Phosphorus: 7.1 mg/dL — ABNORMAL HIGH (ref 2.3–4.6)
Phosphorus: 7.4 mg/dL — ABNORMAL HIGH (ref 2.3–4.6)
Potassium: 4 mEq/L (ref 3.5–5.1)
Potassium: 4.4 mEq/L (ref 3.5–5.1)
Potassium: 4.6 mEq/L (ref 3.5–5.1)
Potassium: 4.7 mEq/L (ref 3.5–5.1)
Potassium: 4.9 mEq/L (ref 3.5–5.1)
Sodium: 134 mEq/L — ABNORMAL LOW (ref 135–145)
Sodium: 134 mEq/L — ABNORMAL LOW (ref 135–145)
Sodium: 137 mEq/L (ref 135–145)
Sodium: 138 mEq/L (ref 135–145)
Sodium: 139 mEq/L (ref 135–145)

## 2011-04-02 LAB — ABO/RH
ABO/RH(D): O POS
ABO/RH(D): O POS

## 2011-04-02 LAB — CROSSMATCH
ABO/RH(D): O POS
ABO/RH(D): O POS
Antibody Screen: NEGATIVE
Antibody Screen: NEGATIVE

## 2011-04-02 LAB — DIFFERENTIAL
Basophils Absolute: 0 10*3/uL (ref 0.0–0.1)
Basophils Relative: 0 % (ref 0–1)
Eosinophils Absolute: 0 10*3/uL (ref 0.0–0.7)
Eosinophils Relative: 0 % (ref 0–5)
Lymphocytes Relative: 3 % — ABNORMAL LOW (ref 12–46)
Lymphs Abs: 0.3 10*3/uL — ABNORMAL LOW (ref 0.7–4.0)
Monocytes Absolute: 0.2 10*3/uL (ref 0.1–1.0)
Monocytes Relative: 2 % — ABNORMAL LOW (ref 3–12)
Neutro Abs: 9.4 10*3/uL — ABNORMAL HIGH (ref 1.7–7.7)
Neutrophils Relative %: 95 % — ABNORMAL HIGH (ref 43–77)

## 2011-04-02 LAB — FERRITIN
Ferritin: 1200 ng/mL — ABNORMAL HIGH (ref 10–291)
Ferritin: 1425 ng/mL — ABNORMAL HIGH (ref 10–291)

## 2011-04-02 LAB — IRON AND TIBC
Iron: 82 ug/dL (ref 42–135)
Iron: 98 ug/dL (ref 42–135)
Saturation Ratios: 37 % (ref 20–55)
Saturation Ratios: 51 % (ref 20–55)
TIBC: 193 ug/dL — ABNORMAL LOW (ref 250–470)
TIBC: 220 ug/dL — ABNORMAL LOW (ref 250–470)
UIBC: 138 ug/dL
UIBC: 95 ug/dL

## 2011-04-02 LAB — POCT CARDIAC MARKERS
CKMB, poc: <1 ng/mL — ABNORMAL LOW (ref 1.0–8.0)
CKMB, poc: <1 ng/mL — ABNORMAL LOW (ref 1.0–8.0)
Myoglobin, poc: 116 ng/mL (ref 12–200)
Myoglobin, poc: 169 ng/mL (ref 12–200)
Troponin i, poc: <0.05 ng/mL (ref 0.00–0.09)
Troponin i, poc: <0.05 ng/mL (ref 0.00–0.09)

## 2011-04-02 LAB — VITAMIN B12: Vitamin B-12: 473 pg/mL (ref 211–911)

## 2011-04-02 LAB — TACROLIMUS LEVEL: Tacrolimus (FK506) - LabCorp: 4.6 ng/mL

## 2011-04-02 LAB — CORTISOL
Cortisol, Plasma: 3.6 ug/dL
Cortisol, Plasma: 5.5 ug/dL
Cortisol, Plasma: 9 ug/dL

## 2011-04-02 LAB — CK TOTAL AND CKMB (NOT AT ARMC)
CK, MB: 1 ng/mL (ref 0.3–4.0)
Relative Index: INVALID (ref 0.0–2.5)
Total CK: 38 U/L (ref 7–177)

## 2011-04-02 LAB — FOLATE: Folate: 8.2 ng/mL

## 2011-04-02 LAB — TSH: TSH: 0.854 u[IU]/mL (ref 0.350–4.500)

## 2011-04-02 LAB — PHOSPHORUS: Phosphorus: 7 mg/dL — ABNORMAL HIGH (ref 2.3–4.6)

## 2011-04-02 LAB — RETICULOCYTES
RBC.: 2.89 MIL/uL — ABNORMAL LOW (ref 3.87–5.11)
Retic Count, Absolute: 63.6 10*3/uL (ref 19.0–186.0)
Retic Ct Pct: 2.2 % (ref 0.4–3.1)

## 2011-04-02 LAB — TROPONIN I: Troponin I: 0.05 ng/mL (ref 0.00–0.06)

## 2011-04-02 LAB — MAGNESIUM: Magnesium: 2.3 mg/dL (ref 1.5–2.5)

## 2011-04-04 ENCOUNTER — Emergency Department (HOSPITAL_COMMUNITY)
Admission: EM | Admit: 2011-04-04 | Discharge: 2011-04-04 | Discharge status: Against Medical Advice | Payer: Medicare Other | Attending: Emergency Medicine | Admitting: Emergency Medicine

## 2011-04-04 DIAGNOSIS — R109 Unspecified abdominal pain: Secondary | ICD-10-CM | POA: Insufficient documentation

## 2011-04-04 LAB — DIFFERENTIAL
Band Neutrophils: 0 % (ref 0–10)
Basophils Absolute: 0 10*3/uL (ref 0.0–0.1)
Basophils Relative: 0 % (ref 0–1)
Blasts: 0 %
Eosinophils Absolute: 0.4 10*3/uL (ref 0.0–0.7)
Eosinophils Relative: 3 % (ref 0–5)
Lymphocytes Relative: 3 % — ABNORMAL LOW (ref 12–46)
Lymphs Abs: 0.4 10*3/uL — ABNORMAL LOW (ref 0.7–4.0)
Metamyelocytes Relative: 0 %
Monocytes Absolute: 0.4 10*3/uL (ref 0.1–1.0)
Monocytes Relative: 3 % (ref 3–12)
Myelocytes: 0 %
Neutro Abs: 11.7 10*3/uL — ABNORMAL HIGH (ref 1.7–7.7)
Neutrophils Relative %: 91 % — ABNORMAL HIGH (ref 43–77)
Promyelocytes Absolute: 0 %
Smear Review: ADEQUATE
nRBC: 0 /100 WBC

## 2011-04-04 LAB — CBC
HCT: 29 % — ABNORMAL LOW (ref 36.0–46.0)
Hemoglobin: 9.7 g/dL — ABNORMAL LOW (ref 12.0–15.0)
MCHC: 33.5 g/dL (ref 30.0–36.0)
MCV: 83.9 fL (ref 78.0–100.0)
Platelets: 370 10*3/uL (ref 150–400)
RBC: 3.46 MIL/uL — ABNORMAL LOW (ref 3.87–5.11)
RDW: 15.8 % — ABNORMAL HIGH (ref 11.5–15.5)
WBC: 12.9 10*3/uL — ABNORMAL HIGH (ref 4.0–10.5)

## 2011-04-04 LAB — URINALYSIS, ROUTINE W REFLEX MICROSCOPIC
Bilirubin Urine: NEGATIVE
Glucose, UA: 100 mg/dL — AB
Ketones, ur: NEGATIVE mg/dL
Leukocytes, UA: NEGATIVE
Nitrite: NEGATIVE
Protein, ur: 100 mg/dL — AB
Specific Gravity, Urine: 1.01 (ref 1.005–1.030)
Urobilinogen, UA: 0.2 mg/dL (ref 0.0–1.0)
pH: 6 (ref 5.0–8.0)

## 2011-04-04 LAB — BASIC METABOLIC PANEL
BUN: 31 mg/dL — ABNORMAL HIGH (ref 6–23)
CO2: 24 mEq/L (ref 19–32)
Calcium: 10.4 mg/dL (ref 8.4–10.5)
Chloride: 103 mEq/L (ref 96–112)
Creatinine, Ser: 5.27 mg/dL — ABNORMAL HIGH (ref 0.4–1.2)
GFR calc Af Amer: 11 mL/min — ABNORMAL LOW (ref >60)
GFR calc non Af Amer: 9 mL/min — ABNORMAL LOW (ref >60)
Glucose, Bld: 113 mg/dL — ABNORMAL HIGH (ref 70–99)
Potassium: 3.6 mEq/L (ref 3.5–5.1)
Sodium: 138 mEq/L (ref 135–145)

## 2011-04-04 LAB — LIPASE, BLOOD: Lipase: 28 U/L (ref 11–59)

## 2011-04-04 LAB — URINE MICROSCOPIC-ADD ON

## 2011-04-06 ENCOUNTER — Ambulatory Visit (HOSPITAL_COMMUNITY)
Admission: RE | Admit: 2011-04-06 | Discharge: 2011-04-06 | Disposition: A | Discharge status: Home / Self Care | Payer: Medicare Other | Source: Ambulatory Visit | Attending: Internal Medicine | Admitting: Internal Medicine

## 2011-04-06 ENCOUNTER — Other Ambulatory Visit (HOSPITAL_BASED_OUTPATIENT_CLINIC_OR_DEPARTMENT_OTHER): Payer: Self-pay | Admitting: Internal Medicine

## 2011-04-06 DIAGNOSIS — M869 Osteomyelitis, unspecified: Secondary | ICD-10-CM

## 2011-04-06 DIAGNOSIS — Z4789 Encounter for other orthopedic aftercare: Secondary | ICD-10-CM | POA: Insufficient documentation

## 2011-04-06 DIAGNOSIS — M948X9 Other specified disorders of cartilage, unspecified sites: Secondary | ICD-10-CM | POA: Insufficient documentation

## 2011-04-09 LAB — CROSSMATCH
ABO/RH(D): O POS
Antibody Screen: NEGATIVE

## 2011-04-11 DIAGNOSIS — Z0389 Encounter for observation for other suspected diseases and conditions ruled out: Secondary | ICD-10-CM | POA: Insufficient documentation

## 2011-04-11 DIAGNOSIS — R079 Chest pain, unspecified: Secondary | ICD-10-CM | POA: Insufficient documentation

## 2011-04-12 ENCOUNTER — Emergency Department (HOSPITAL_COMMUNITY)
Admission: EM | Admit: 2011-04-12 | Discharge: 2011-04-12 | Discharge status: Against Medical Advice | Payer: BC Managed Care – PPO | Attending: Emergency Medicine | Admitting: Emergency Medicine

## 2011-04-13 ENCOUNTER — Encounter (HOSPITAL_BASED_OUTPATIENT_CLINIC_OR_DEPARTMENT_OTHER)
Admission: RE | Admit: 2011-04-13 | Discharge: 2011-04-13 | Disposition: A | Discharge status: Home / Self Care | Payer: Medicare Other | Source: Ambulatory Visit | Attending: Specialist | Admitting: Specialist

## 2011-04-16 ENCOUNTER — Other Ambulatory Visit: Payer: Self-pay | Admitting: Specialist

## 2011-04-16 ENCOUNTER — Ambulatory Visit (HOSPITAL_BASED_OUTPATIENT_CLINIC_OR_DEPARTMENT_OTHER)
Admission: RE | Admit: 2011-04-16 | Discharge: 2011-04-17 | Disposition: A | Discharge status: Home / Self Care | Payer: Medicare Other | Source: Ambulatory Visit | Attending: Specialist | Admitting: Specialist

## 2011-04-16 DIAGNOSIS — Z940 Kidney transplant status: Secondary | ICD-10-CM | POA: Insufficient documentation

## 2011-04-16 DIAGNOSIS — E119 Type 2 diabetes mellitus without complications: Secondary | ICD-10-CM | POA: Insufficient documentation

## 2011-04-16 DIAGNOSIS — Q838 Other congenital malformations of breast: Secondary | ICD-10-CM | POA: Insufficient documentation

## 2011-04-16 DIAGNOSIS — N62 Hypertrophy of breast: Secondary | ICD-10-CM | POA: Insufficient documentation

## 2011-04-16 DIAGNOSIS — E669 Obesity, unspecified: Secondary | ICD-10-CM | POA: Insufficient documentation

## 2011-04-16 DIAGNOSIS — Z01812 Encounter for preprocedural laboratory examination: Secondary | ICD-10-CM | POA: Insufficient documentation

## 2011-04-16 LAB — GLUCOSE, CAPILLARY
Glucose-Capillary: 127 mg/dL — ABNORMAL HIGH (ref 70–99)
Glucose-Capillary: 172 mg/dL — ABNORMAL HIGH (ref 70–99)

## 2011-04-24 ENCOUNTER — Encounter (HOSPITAL_BASED_OUTPATIENT_CLINIC_OR_DEPARTMENT_OTHER): Payer: Medicare Other | Attending: Internal Medicine

## 2011-04-24 ENCOUNTER — Other Ambulatory Visit (HOSPITAL_BASED_OUTPATIENT_CLINIC_OR_DEPARTMENT_OTHER): Payer: Self-pay | Admitting: Internal Medicine

## 2011-04-24 DIAGNOSIS — Z905 Acquired absence of kidney: Secondary | ICD-10-CM | POA: Insufficient documentation

## 2011-04-24 DIAGNOSIS — L97509 Non-pressure chronic ulcer of other part of unspecified foot with unspecified severity: Secondary | ICD-10-CM | POA: Insufficient documentation

## 2011-04-24 DIAGNOSIS — T25039A Burn of unspecified degree of unspecified toe(s) (nail), initial encounter: Secondary | ICD-10-CM | POA: Insufficient documentation

## 2011-04-24 DIAGNOSIS — E119 Type 2 diabetes mellitus without complications: Secondary | ICD-10-CM | POA: Insufficient documentation

## 2011-04-24 DIAGNOSIS — T310 Burns involving less than 10% of body surface: Secondary | ICD-10-CM | POA: Insufficient documentation

## 2011-04-24 DIAGNOSIS — Z79899 Other long term (current) drug therapy: Secondary | ICD-10-CM | POA: Insufficient documentation

## 2011-04-24 DIAGNOSIS — T5494XA Toxic effect of unspecified corrosive substance, undetermined, initial encounter: Secondary | ICD-10-CM | POA: Insufficient documentation

## 2011-04-24 LAB — GLUCOSE, CAPILLARY
Glucose-Capillary: 166 mg/dL — ABNORMAL HIGH (ref 70–99)
Glucose-Capillary: 145 mg/dL — ABNORMAL HIGH (ref 70–99)

## 2011-04-25 ENCOUNTER — Other Ambulatory Visit (HOSPITAL_BASED_OUTPATIENT_CLINIC_OR_DEPARTMENT_OTHER): Payer: Self-pay | Admitting: Internal Medicine

## 2011-04-25 LAB — GLUCOSE, CAPILLARY
Glucose-Capillary: 156 mg/dL — ABNORMAL HIGH (ref 70–99)
Glucose-Capillary: 190 mg/dL — ABNORMAL HIGH (ref 70–99)

## 2011-04-25 NOTE — Op Note (Signed)
  NAME:  MARLEIGH, ANGELICO              ACCOUNT NO.:  [id number]  MEDICAL RECORD NO.:  [id number]           PATIENT TYPE:  LOCATION:                                 FACILITY:  PHYSICIAN:  Berneta Sages L. Bryana Froemming, M.D.DATE OF BIRTH:  [birth year]  DATE OF PROCEDURE:  04/16/2011 DATE OF DISCHARGE:                              OPERATIVE REPORT   This is a 35 year old lady with severe back and shoulder pain secondary to large pendulous breasts.  She also has increased intertriginous changes as well as accessory breast tissue into the axillary, latissimus, dorsi areas, also caused frictional changes, and intertriginous changes and pain and discomfort.  She has failed conservative treatment including ice packs, cold packs, and using other modalities.  PROCEDURES PERFORMED:  Bilateral breast reductions using the inferior pedicle technique, excision of accessory breast tissue.  ANESTHESIA:  General.  Preoperatively, the patient was sat up and drawn for the inferior pedicle reduction mammoplasty.  We redrew the nipple-areolar complex from over 40 cm to 22 cm on each side.  The patient underwent general anesthesia and intubated orally.  Prep was done to the chest and breast areas in routine fashion using tumescent solution, 500 mL per side. After this, the wounds were scored with #15 blade.  The skin of the inferior pedicle was de-epithelialized with #20 blade.  Medial and lateral fatty dermal pedicles were incised down to the underlying fascia.  Laterally more breast tissue was removed as well as accessory breast tissue removed with Bovie cutting coagulation.  After proper hemostasis, the flaps were then stayed and rotated with 3-0 Monocryl. The subcutaneous planes were closed with 3-0 Monocryl x2 levels and then the skin edges were approximated with running subcuticular stitch of 3-0 Monocryl of all the areas.  Same procedure was carried out on both sides.  The wounds were drained with #10  fully-fluted Blake drains, which were placed in the depths of the wound and brought out through the lateral-most portion of the incision and secured with 3-0 Prolene. Wounds were cleansed.  Steri-Strips and soft dressing applied including Xeroform, 4x4s, ABDs, Hypafix tape.  She withstood the procedure very well, was taken to recovery room in excellent condition.  Estimated blood loss was less 150 mL.  Complications were none.  At the end of the procedure, nipple-areolar complex were examined and showing excellent blood supply as well.     Odella Aquas. Towanda Malkin, M.D.     Elie Confer  D:  04/16/2011  T:  04/17/2011  Job:  [id number]  Electronically Signed by Cristine Polio M.D. on 04/25/2011 04:09:42 PM

## 2011-04-30 ENCOUNTER — Other Ambulatory Visit (HOSPITAL_BASED_OUTPATIENT_CLINIC_OR_DEPARTMENT_OTHER): Payer: Self-pay | Admitting: Internal Medicine

## 2011-04-30 LAB — GLUCOSE, CAPILLARY
Glucose-Capillary: 224 mg/dL — ABNORMAL HIGH (ref 70–99)
Glucose-Capillary: 208 mg/dL — ABNORMAL HIGH (ref 70–99)

## 2011-05-07 IMAGING — CR DG CHEST 1V PORT
1 series · 1 of 1 positions shown · non-contrast
Comparison: Chest x-ray 12/29/2010

CLINICAL DATA: Chest congestion.

PORTABLE CHEST - 1 VIEW

[AP]
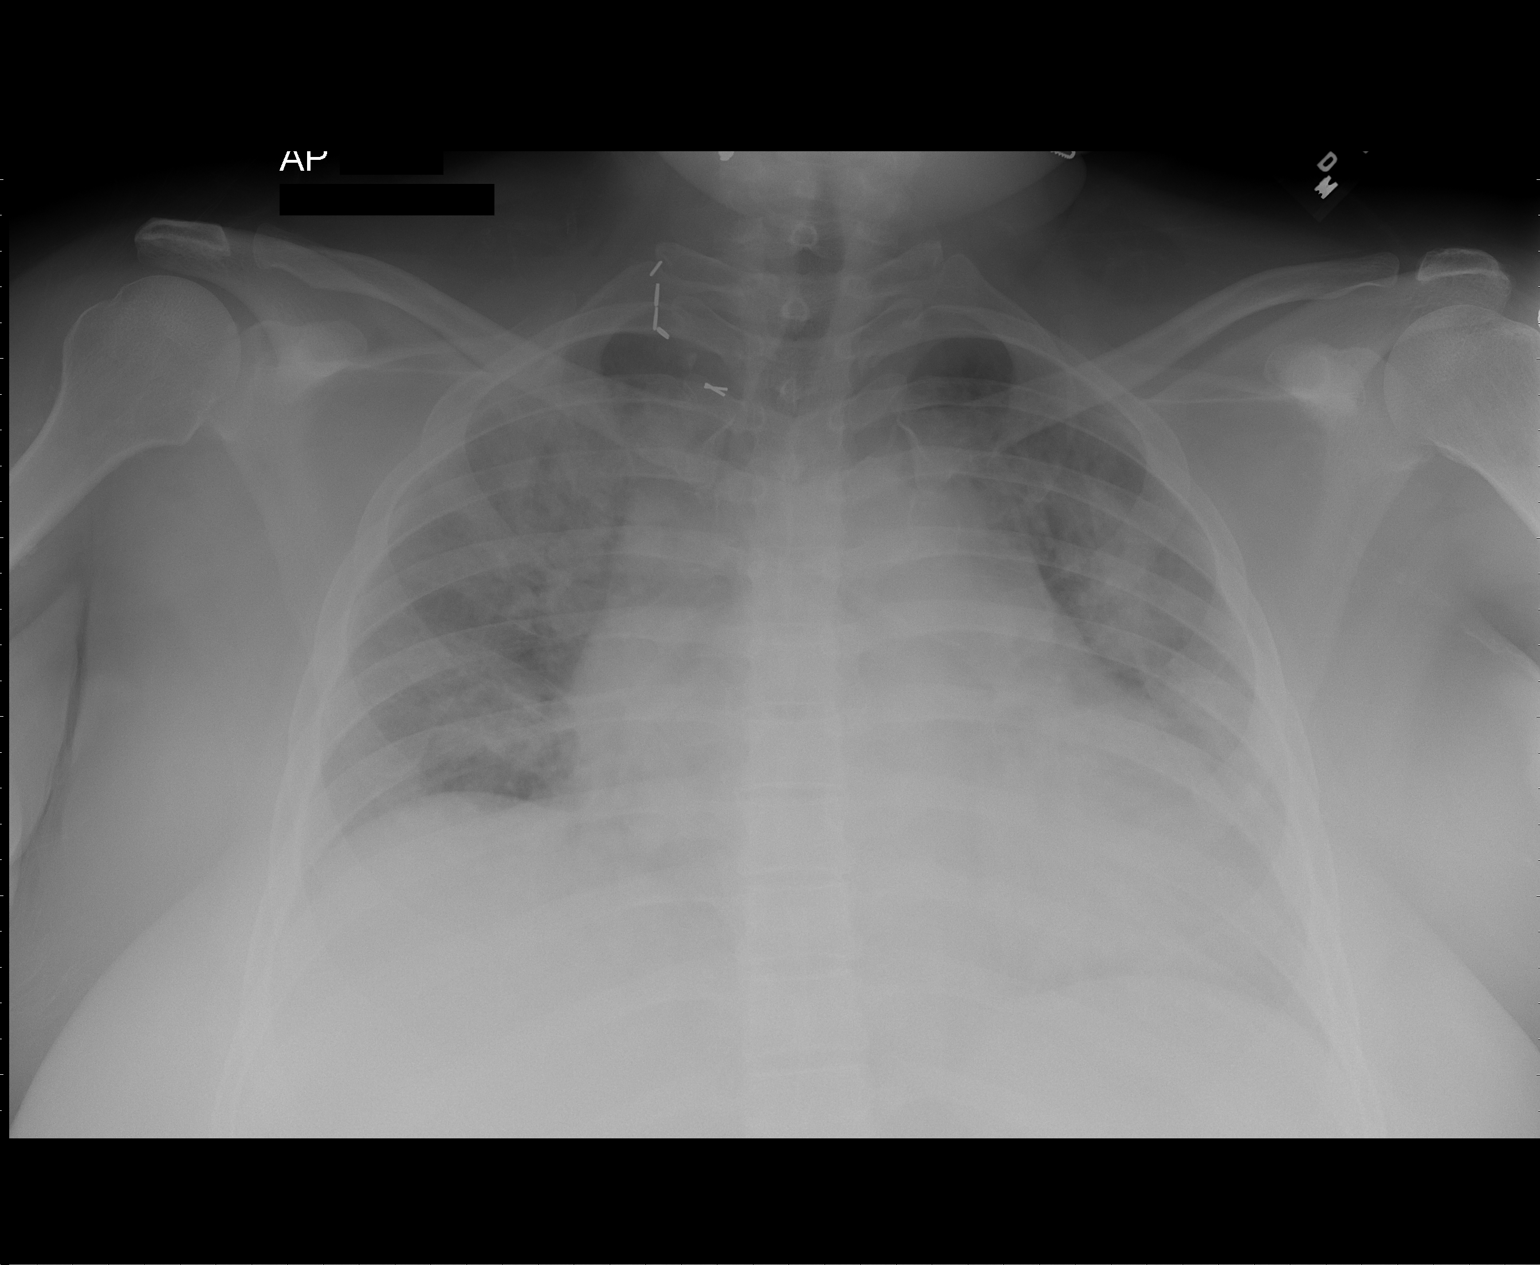

[1 of 1 positions shown; findings below may reference images not displayed]

FINDINGS: The heart is enlarged.  There is vascular congestion and
pulmonary edema consistent with CHF.  No definite pleural
effusions.
IMPRESSION: Congestive heart failure.

## 2011-05-08 ENCOUNTER — Other Ambulatory Visit (HOSPITAL_BASED_OUTPATIENT_CLINIC_OR_DEPARTMENT_OTHER): Payer: Self-pay | Admitting: Internal Medicine

## 2011-05-08 LAB — GLUCOSE, CAPILLARY
Glucose-Capillary: 184 mg/dL — ABNORMAL HIGH (ref 70–99)
Glucose-Capillary: 192 mg/dL — ABNORMAL HIGH (ref 70–99)

## 2011-05-08 NOTE — H&P (Signed)
[name], [name]              ACCOUNT NO.:  [id number]   MEDICAL RECORD NO.:  [id number]          PATIENT TYPE:  INP   LOCATION:  [id number]                         FACILITY:  [facility]   PHYSICIAN:  Jacquelynn Cree, M.D.   DATE OF BIRTH:  Jul 14, 1976   DATE OF ADMISSION:  06/21/2009  DATE OF DISCHARGE:                              HISTORY & PHYSICAL   PRIMARY CARE PHYSICIAN:  Dr. Lottie Dawson at [facility].   NEPHROLOGIST:  Dr. Erling Cruz.   CHIEF COMPLAINT:  Panic attack.   HISTORY OF PRESENT ILLNESS:  The patient is a 35 year old female with a  past medical history of end-stage renal disease status post renal  transplantation in 1999, now with rejection and awaiting a second renal  transplant, who was recently hospitalized for evaluation of chest pain  from June 10, 2009, through June 17, 2009.  The patient did well for a  few days posthospitalization but then had an episode where she became  panicky, and this was accompanied by chest tightness, shortness of  breath, and a feeling of impending doom.  The patient has a known  history of OCD and problems with panic attacks in the past.  The patient  subsequently asked her mother to bring her to the hospital, and upon  initial evaluation, she was noted to have a hemoglobin of 7 mg/dL and  subsequently was referred to the hospitalist service for admission for  blood transfusion.  At that time, the patient was evaluated in the  emergency department, she had no further symptoms of a panic attack.  She was subsequently transferred to [facility] from [facility].  She is currently receiving packed red blood cells and is somewhat  lethargic, presumably from having been treated with sedating  medications.  The patient is currently denying any dyspnea or chest  pain.   PAST MEDICAL HISTORY:  1. End-stage renal disease status post renal transplantation in 1999,      now with rejection.  2. Status post right AV fistula placement.  3. Status post right nephrectomy secondary to renal cell carcinoma in      2005.  4. Diabetes.  5. History of GI bleed/peptic ulcer disease status post EGD 09/2008      which showed pyloric channel ulcers, antral erosions, and a hiatal      hernia.  6. Dyslipidemia.  7. History of Crohn disease.  8. Hypertension.  9. Anemia of chronic disease.  10.Gastroesophageal reflux disease.  11.Status post total parathyroidectomy with autotransplantation.  12.History of pilonidal cyst status post I and D in 2004.  13.History of cytomegalovirus.  14.Anxiety disorder.  15.Obsessive-compulsive disorder.  16.Obesity.  17.History of fibroidectomy an uterine ablation.  18.Status post I and D of left axillary abscess.  19.History of MRSA.  20.Gastroparesis.  21.History of bilateral tubal ligation.  22.History of right ACL tear/repair and meniscal repair.   CURRENT MEDICATIONS:  1. CellCept 500 mg p.o. b.i.d.  2. Prednisone 10 mg p.o. daily.  3. Sodium bicarbonate 650 mg p.o. t.i.d.  4. Prograf 3 mg p.o. b.i.d.  5. Calcitriol 1.5 mg p.o.  daily.  6. Zocor 20 mg p.o. daily.  7. Zoloft 100 mg p.o. daily.  8. Norvasc 10 mg p.o. daily.  9. Actos 30 mg p.o. daily.  10.Ambien 20 mg p.o. every night p.r.n.  11.Vistaril 25 mg p.o. t.i.d.  12.Xanax 1 mg p.o. every night sleep.  13.Zofran 4 mg p.o. q.i.d.  14.Phenergan 50 mg p.o. q.6 h. p.r.n. nausea.  15.Flexeril 5 mg p.o. every night.  16.Claritin D q.12 h. p.r.n. allergy symptoms.  17.Lyrica 25 mg p.o. daily.  18.Compazine 10 mg p.o. q.8 h. p.r.n.   ALLERGIES:  DILAUDID, IV DYE/IODINE, IV IRON.  VANCOMYCIN causes Red Man  Syndrome, but she can take this with premedication with Benadryl.   SOCIAL HISTORY:  The patient is single.  She has no offspring.  She  works as a Pharmacist, hospital.  She denies any tobacco, alcohol, or drug use.  She  is currently working on her Ph.D. and has a bachelor's degree in [place]  and a master's degree in secondary  education and literature.   FAMILY HISTORY:  The patient's mother has hypertension, diabetes, and  coronary artery disease.  Father's history is unknown.  She has two  sisters, one with polycystic ovarian syndrome.   REVIEW OF SYSTEMS:  The patient reports that she lost 26 pounds in 6  weeks' time.  She has chronic pain involving the feet, legs, head, and  back which varies in intensity.  She has had a dry cough.  No dyspnea.  Positive chronic nausea, vomiting, and diarrhea but denies any melena or  hematochezia.  She recently had a bout of dysuria but reports that this  has resolved.  She has recently been treated for thrush.  A  comprehensive 14-point review of systems was conducted and is otherwise  unremarkable.   PHYSICAL EXAMINATION:  Temperature 98.2, blood pressure 122/72, pulse  87, respirations 21, O2 saturation 100% on room air.  GENERAL:  This is an obese female who is lethargic but in otherwise no  acute distress.  HEENT:  Normocephalic, atraumatic.  Eyes:  Sclerae are clear.  Nonicteric.  Extraocular movements are intact.  Pupils are equal, round,  reactive to light and accommodation.  Ears, mouth, throat, nose, tongue,  and oropharynx are without any notable abnormality.  NECK:  Supple.  The patient has multiple surgical scars.  No palpable  masses.  No thyromegaly.  LUNGS:  Clear to auscultation bilaterally with good air movement.  No  chest wall tenderness.  HEART:  Regular rate, rhythm.  No murmurs, rubs, or gallops.  Radial and  pedal pulses are 2+.  No JVD.  There is an AV fistula in the right  antecubital space with a good thrill and bruit.  GASTROINTESTINAL:  Soft, nontender, nondistended with normoactive bowel  sounds.  MUSCULOSKELETAL:  The patient moves all extremities x4 with equal  strength.  SKIN:  No rashes.  NEUROLOGIC:  The patient is lethargic but oriented x3.  Cranial nerves  II-XII grossly intact.  Nonfocal.  PSYCHIATRIC:  The patient's mood is  depressed.  She is not currently  anxious.   LABORATORY DATA:  Sodium is 138, potassium 3.3, chloride 107, bicarb 26,  BUN 44, creatinine 4.96, glucose 99, calcium 10.4.  White blood cell  count is 10.1, hemoglobin 7, hematocrit 20.8, platelets 252.  Point of  care cardiac markers are negative.   Chest x-ray shows stable cardiomegaly.   ASSESSMENT AND PLAN:  1. Panic attack:  This appears resolved.  This may have  been triggered      by symptomatic anemia with palpitations and dyspnea.  We will      provide her with Ativan p.r.n. for any recurrence and treat likely      underlying trigger which is her significant anemia.  2. Anemia of chronic disease:  The patient will receive 2 units of      packed red blood cells today.  Will monitor her closely for signs      of volume overload and recheck hemoglobin in the morning.  3. Hypokalemia:  Will cautiously replete given her end-stage renal      disease.  4. End-stage renal disease:  The patient's creatinine is at her      baseline value.  There is no indication for emergent hemodialysis.      She is not currently uremic.  Her bicarb is good at 9, and she is      not hyperkalemic.  5. Chronic pain:  Will continue the patient's usual home medications.  6. Chronic nausea/vomiting/diarrhea:  Continue the patient's usual      pain medications.  7. Dyslipidemia:  Continue the patient's statin therapy.  8. Diabetes:  Will continue Actos and cover her with sliding scale      insulin q.a.c. and h.s.  We will put her on a carbohydrate modified      diet and monitor her glycemic control closely.  9. Anxiety/obsessive compulsive disorder:  We will continue the      patient's Zoloft and provide her with Ativan as needed.   Time spent analyzing data, gathering data, interviewing the patient,  examining the patient, and formulating plan of care approximately equal  to 1 hour.      Jacquelynn Cree, M.D.  Electronically Signed     CR/MEDQ  D:   06/21/2009  T:  06/21/2009  Job:  [id number]   cc:   Carola Rhine, M.D.  [facility]   Ollen Gross C. Florene Glen, M.D.  Fax: [phone]

## 2011-05-08 NOTE — H&P (Signed)
NAME:  [name], [name] NO.:  [id number]   MEDICAL RECORD NO.:  [id number]          PATIENT TYPE:  EMS   LOCATION:  MAJO                         FACILITY:  [facility]   PHYSICIAN:  Rise Patience, MDDATE OF BIRTH:  Jul 25, 1976   DATE OF ADMISSION:  09/28/2008  DATE OF DISCHARGE:                              HISTORY & PHYSICAL   PRIMARY CARE PHYSICIAN:  Unassigned.   CHIEF COMPLAINT:  Abdominal pain, nausea and vomiting.   HISTORY OF PRESENT ILLNESS:  A 35 year old female with known history of  Crohn's disease, chronic renal insufficiency, status post renal  transplant in 1999, history of hypertension, hyperlipidemia, anemia  secondary to ESRD and chronic disease, presented to the ER complaining  of increasing abdominal pain, nausea and vomiting.  The patient has been  having these symptoms the last 2 days.  The patient states that she  usually has abdominal pain which is sometimes diffuse and crampy, but  this time it has been constant and is more in the epigastric area,  nonradiating, associated along with nausea and vomiting.  The patient  states the vomitus is bilious in nature and today morning some of her  medications were present in the vomitus.  Denies any blood in the  vomitus.  The patient has been having diarrhea for almost many years now  since the diagnosis of inflammatory bowel disease.  The patient has had  3-4 bowel movements per day.  She states the vomitus has not increased  in frequency or intensity.  She denies any fever or chills.  Denies any  chest pain, shortness of breath, weakness of limbs.  She does have some  dizziness and blurred vision transiently when she tries to stand up  suddenly.  Presently, the patient has no symptoms of blurred vision or  dizziness.  The patient denies any dysuria or headache.   PAST MEDICAL HISTORY:  1. Crohn's disease.  2. ESRD, status post renal transplant in 1999.  3. History of hypertension.  4.  Hyperlipidemia.  5. Chronic diarrhea.  6. History of Crohn's disease.  7. Anemia, probably from chronic kidney disease and chronic disease.   PAST SURGICAL HISTORY:  1. Tubal ligation.  2. Recent AV fistula placement.  3. Renal transplant in 1999.   MEDICATIONS PRIOR TO ADMISSION:  1. Asacol 2400 mg p.o. b.i.d.  2. Calcitriol 1 mcg p.o. daily.  3. CellCept 500 mg p.o. b.i.d.  4. Clonidine 0.2 mg p.o. at bedtime.  5. Neurontin 300 mg at bedtime.  6. Phenergan 50 mg p.o. at bedtime.  7. Prilosec 20 mg p.o. b.i.d.  8. Sodium bicarbonate 600 mg p.o. t.i.d.  9. Vistaril 25 mg p.o. p.r.n. for itching.  10.Zocor 20 mg p.o. daily.  11.Zofran 8 mg p.o. b.i.d.  12.Zoloft 25 mg p.o. daily.  13.Norvasc 5 mg p.o. daily.  14.Prednisone 10 mg p.o. daily.  15.Ativan 0.5 mg p.o. h.s.  16.Xanax 1 mg p.o. at bedtime.  17.Prograf 3 mg p.o. b.i.d.  18.Compazine 10 mg p.o. daily p.r.n.  19.Procrit 40,000 units on Wednesday and Saturday.  20.Lomotil 6 mg p.o. daily.  ALLERGIES:  1. VANCOMYCIN.  2. IV DYE.  3. IV IODINE.  4. DILAUDID.   FAMILY HISTORY:  Significant for diabetes mellitus and hypertension.   SOCIAL HISTORY:  The patient denies smoking cigarettes, drinking alcohol  or using illegal drugs.   REVIEW OF SYSTEMS:  As per history of present illness, nothing else  significant.   PHYSICAL EXAMINATION:  GENERAL:  The patient examined at bedside, not in  acute distress.  VITAL SIGNS:  Blood pressure is 140/50, pulse 80 per minute, temperature  98.4, respirations 18 per minute.  O2 sat 97%.  HEENT:  Anicteric.  No pallor.  CHEST:  Bilateral air entry present.  No rhonchi.  No crepitation.  HEART:  S1-S2 heard.  ABDOMEN:  Soft, mild tenderness in the epigastric and left upper  quadrant.  Bowel sounds heard.  No guarding or rigidity.  No  discoloration.  CNS:  Alert, awake.  Oriented to time, place and person.  Moves upper  and lower extremities 5/5.  EXTREMITIES:  Peripheral  pulses felt.  No edema.   LABORATORY DATA:  Acute abdominal series; mild to moderate pulmonary  vascular congestion, no infiltrates, nonspecific bowel distention.  CBC  - WBCs 11.4, hemoglobin 7.5, hematocrit 23, platelets 293, neutrophils  90%.  Complete metabolic panel; sodium [id number], potassium 4.3, chloride 106,  carbon dioxide 18, glucose 135, BUN 44, creatinine 4.93, total bilirubin  0.6, alkaline phosphatase 41, AST 12, ALT less than 8, total protein  6.6, albumin 3.7, calcium 11, lipase 30.  Pregnancy screen is negative.  UA is positive for glucose, ketones negative, protein 100, nitrites  negative, leukocytes negative, WBCs 0-2.   ASSESSMENT:  1. Abdominal pain with nausea and vomiting, probably Crohn's      exacerbation.  2. Acute renal failure on chronic kidney disease.  History of renal      transplant.  3. Dehydration.  4. Metabolic acidosis.  5. Anemia, probably from chronic kidney disease and chronic disease.  6. Hypercalcemia, mild.  7. History of hypertension.  8. History of hyperlipidemia.  9. History of depression.   PLAN:  Admit the patient to telemetry.  Will transfuse 1 unit of blood.  Obtain blood cultures, urine cultures.  Place the patient on bicarbonate  drip.  Will start on clear liquid diet.  Get a CT of the abdomen and  pelvis without contrast.  Will resume home medication.  Place the  patient on a stress dose steroids.  Will obtain a gastroenterology and  nephrology consult in a.m., and further recommendation accordingly.      Rise Patience, MD  Electronically Signed     ANK/MEDQ  D:  09/28/2008  T:  09/28/2008  Job:  [id number]

## 2011-05-08 NOTE — Consult Note (Signed)
NAMERAYVEN, SENNOTT              ACCOUNT NO.:  [id number]   MEDICAL RECORD NO.:  [id number]          PATIENT TYPE:  INP   LOCATION:  3708                         FACILITY:  [place] [facility]   PHYSICIAN:  Tory Emerald. Benson Norway, MD    DATE OF BIRTH:  08/23/1976   DATE OF CONSULTATION:  09/28/2008  DATE OF DISCHARGE:                                 CONSULTATION   REASON FOR CONSULTATION:  Anemia and weakness.   RENAL PHYSICIAN:  Ollen Gross C. 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  The patient states for the past week she  has not felt well and has also developed diarrhea.  Her bowel movements  have been dark at some points where she described it as being melena.  Overall she feels weak.  She was last seen at [facility] for her  gastroparesis and the thought was to have the patient undergo gastric  pacing; however, she was also reporting similar types of complaints.  During her prior EGD in August she was noted to have persistent antral  ulcer and Hemoccult was placed on at that point, and there is no further  bleeding subsequently, although at the second endoscopy there was no  evidence of any active bleeding at that time.  Her baseline hemoglobin  is typically anywhere from the 10-12 range.  Because of her symptoms she  subsequently presented to the emergency room for further evaluation and  treatment.   PAST MEDICAL HISTORY:  As stated above.   PAST SURGICAL HISTORY:  As stated above.   FAMILY HISTORY:   Noncontributory.   SOCIAL HISTORY:  Negative for alcohol, tobacco, or illicit drug use.   REVIEW OF SYSTEMS:  As stated above in the history of present illness,  otherwise negative.   ALLERGIES:  VANCOMYCIN, IV DYE, NSAIDs, and possibly IV IRON.   PHYSICAL EXAMINATION:  VITAL SIGNS:  Stable.  GENERAL:  The patient is in no acute distress.  Alert and oriented.  HEENT:  Normocephalic and atraumatic.  Extraocular muscles intact.  NECK:  Supple.  No lymphadenopathy.  LUNGS:  Clear to auscultation bilaterally.  CARDIOVASCULAR:  Regular rate and rhythm.  ABDOMEN:  Obese, soft, nontender, and nondistended.  EXTREMITIES:  No clubbing, cyanosis, or edema.  RECTAL:  Heme-positive with brown stool.   LABORATORY VALUES:  White blood cell count is 11.4, hemoglobin is 7.5,  and platelets at 293.  Sodium 136, potassium 4.3, chloride 106, CO2 18,  glucose 135, BUN 44, and creatinine 4.9.   IMPRESSION:  1. Anemia.  2. Heme-positive stool.  3. Diarrhea.  4. Gastroparesis.  5. End-stage renal disease.   At this time, the patient appears to  have a gastrointestinal source of  bleeding with a heme-positive stool and her anemia.  A repeat endoscopy  is required at this time.  She is currently receiving 1unit of packed  red blood cells.  Overall she is hemodynamically stable.   PLAN:  1. At this time is to follow her hemoglobin.  2. Transfuse as necessary.  3. She will undergo an EGD for further evaluation.      Tory Emerald Benson Norway, MD  Electronically Signed     PDH/MEDQ  D:  09/28/2008  T:  09/29/2008  Job:  [id number]   cc:   Darrold Span. Florene Glen, M.D.

## 2011-05-08 NOTE — Assessment & Plan Note (Signed)
OFFICE VISIT   [name], [name]  DOB:  07-22-1976                                       09/03/2008  [id number]   The patient presents today for discussion of AV access.  She is a 35-  year-old black female with progressive renal insufficiency.  She had  kidney failure as a child at 65 and underwent transplant in 1999, and  has had a recent progressive deterioration of her renal function.  She  had a prior left arm graft in the past and has not had a functional  graft in the recent past.  She underwent placement of a radiocephalic  fistula by Dr. Annamarie Major on 07/30/2008.  I am seeing her for further  discussion of renal access.  On Dr. Stephens Shire operative note, he reports  heavily calcified radial artery.  Her fistula is taking but she has very  poor flow through this.  I did review the cephalic vein with a handheld  duplex and this does show patency of her cephalic vein in her right  upper arm.  She has had a prior right forearm loop and also prior right  upper arm graft.  I discussed options with the patient.  I explained  that we could attempt a right upper arm AV fistula.  She is relatively  insistent that we have an attempt at a left upper arm graft.  I  explained that her left axillary vein there may not be patent and we  could not do a venogram to determine this due to her renal  insufficiency.  I explained that we could explore her left axillary vein  and, if patent, place a new left upper arm graft.  If not, she would  have a right arm cephalic fistula.  I did discuss with her that from an  overall standpoint that generally fistulas work better than grafts, but  despite this she is worried about long-term durability of this and  wishes that we proceed with an attempted left upper arm AV graft.  If  this fails, she would be a candidate for a right upper arm fistula.  This has been scheduled for 09/22 at [place], M.D.  Electronically Signed   TFE/MEDQ  D:  09/03/2008  T:  09/06/2008  Job:  [id number]   cc:   Jeneen Rinks L. Deterding, M.D.

## 2011-05-08 NOTE — H&P (Signed)
NAME:  [name], [name]              ACCOUNT NO.:  [id number]   MEDICAL RECORD NO.:  [id number]          PATIENT TYPE:  AMB   LOCATION:  DAY                          FACILITY:  [facility]   PHYSICIAN:  Edsel Petrin. Dalbert Batman, M.D.DATE OF BIRTH:  1976/07/20   DATE OF ADMISSION:  09/03/2008  DATE OF DISCHARGE:                              HISTORY & PHYSICAL   CHIEF COMPLAINT:  Abscess left axilla, recurrent.   HISTORY OF PRESENT ILLNESS:  This is a 35 year old black female with a  history of end-stage renal disease, currently with a firm functioning  renal transplant.  She has had a left axillary abscess in the past as  well as an abscess of the right thigh in the past.  She states that she  has had a pain and swelling in the left axilla for 3 weeks but no  drainage.  She self referred herself to our office today.  I have  evaluated her in the office and she has a 3 cm abscess in the left  axilla that requires incision and drainage, but she has declined to have  this done under local anesthesia stating that because of the pain and  her anxiety that she would need an anesthesia to accomplish this.  We  are going to try to accommodate her.  I have discussed her care with Dr.  Michael Boston at [facility], and he is willing to see her and  take her to the operating room this evening for I&D of the left axillary  abscess.   PAST HISTORY:  1. End-stage renal disease.  2. History of cataract renal transplantation.  3. History of gastroesophageal reflux disease.  4. Status post total parathyroidectomy with autotransplantation.  5. History of hypertension.  6. History of pilonidal cyst with I&D by Dr. Autumn Messing  2004.  7. History of Crohn disease.  8. History of cytomegalovirus.  9. History of anxiety and obsessive compulsive disorder.  10.History of hypertension.  11.History of obesity.  12.Status post nephrectomy.  13.Status post bilateral tubal ligation.  14.Status post fibroid removal  with uterine ablation.  15.History of knee surgery.   CURRENT MEDICATIONS:  1. Prograf 3 mg twice daily.  2. CellCept 500 mg b.i.d.  3. Prednisone 10 mg daily.  4. Calcitriol 1 mcg daily.  5. Os-Cal 5 mg three times a day.  6. Norvasc 5 mg daily.  7. Xanax 1 mg daily.  8. Prilosec 40 mg three times a day.  9. Asacol 2400 mg t.i.d.  10.Lomotil 4 mg t.i.d.  11.Phenergan p.r.n.  12.Zocor 20 mg a  13.Zoloft 50 mg daily.  14.Vistaril 25 mg a day.  15.Neurontin 300 mg daily.  16.Ativan p.r.n.  17.Zofran 8 mg twice a day.  18.Reglan 10 mg t.i.d.  19.Procrit 30,000 units twice weekly.   DRUG ALLERGIES:  DILAUDID, VANCOMYCIN, INFED, IVP DYE.   SOCIAL HISTORY:  She denies alcohol or tobacco.  She works in the school  system.  Lives in [place].   REVIEW OF SYSTEMS:  A 15-system review is performed and is not  significant except as described above.  FAMILY HISTORY:  Noncontributory.   PHYSICAL EXAMINATION:  GENERAL:  Short-statured, somewhat obese, black  female in no distress.  VITAL SIGNS:  Temperature 98.7, pulse 77, blood pressure 113/79, weight  232.  HEENT:  Eyes:  Sclerae clear.  Extraocular movements intact.  Ears,  Mouth, Throat, Nose, Tongue and Oropharynx:  Without gross lesions.  NECK:  Supple.  Multiple surgical scars which are well-healed.  No  palpable mass.  No tenderness.  LUNGS:  Clear to auscultation.  No chest wall tenderness.  HEART:  Regular rate and rhythm.  No murmur.  Radial, femoral pulses are  palpable.  EXTREMITIES:  There is an area of erythema and tenderness and swelling  in the left axilla approximately 3.5 cm transversely x about 2 cm  vertically.  This is exquisitely tender but very localized.  The right  axilla is normal.  ABDOMEN:  Soft.  Multiple scars.  No mass.  Nontender.  EXTREMITIES:  She moves all four extremities well without pain or  deformity.  NEUROLOGIC:  No gross motor sensory deficits.   DATA REVIEWED:  Old records  reviewed.   ASSESSMENT:  1. Left axillary abscess.  2. End-stage renal disease.  3. Status post cadaveric renal transplant.  4. Crohn's disease.  5. Cytomegalovirus.  6. Anxiety and obsessive compulsive disorder.  7. Diabetes mellitus type 2.  8. Hypertension.  9. Obesity.   PLAN:  The patient will be admitted for outpatient surgery at [facility] for incision and drainage of the left axillary access.  I  have discussed this with Dr. Remo Lipps gross at [facility] on call, and he  has agreed to accept the patient in transfer and to perform the surgery.  The patient is agree with this.      Edsel Petrin. Dalbert Batman, M.D.  Electronically Signed     HMI/MEDQ  D:  09/03/2008  T:  09/04/2008  Job:  [id number]   cc:   Darrold Span. Florene Glen, M.D.  Fax: [phone]

## 2011-05-08 NOTE — H&P (Signed)
[name], [name]              ACCOUNT NO.:  [id number]   MEDICAL RECORD NO.:  [id number]          PATIENT TYPE:  INP   LOCATION:  6736                         FACILITY:  [facility]   PHYSICIAN:  Tory Emerald. Benson Norway, MD    DATE OF BIRTH:  1976-03-16   DATE OF ADMISSION:  08/05/2008  DATE OF DISCHARGE:                              HISTORY & PHYSICAL   REASON FOR ADMISSION:  Hematemesis.   RENAL PHYSICIAN:  Estanislado Emms, MD   HISTORY OF PRESENT ILLNESS:  This is a 35 year old female who is well  known to me with a past medical history of end-stage renal disease  status post transplantation in 1999; type 2 diabetes, hypertension,  hyperlipidemia, status post right nephrectomy for renal cell carcinoma  in 2005, history of depression, gastroesophageal reflux disease,  secondary hyperparathyroidismm, status post parathyroidectomy for a  questionable primary source, possible ulcerative colitis, and  gastroparesis who was admitted to the hospital with complaints of  hematemesis.  The patient was recently discharged with worsening of her  gastroparetic type of symptoms.  At that point, she was also noted to  have a marked drop in her hemoglobin from a baseline of anywhere in the  11-12 range down to 6.  Subsequently, she underwent an EGD and was noted  have 2 small antral ulcers, one of which was friable and it was felt  with medical treatment that the ulcer would heal.  She was subsequently  discharged home after having a right wrist fistula placed and she  returned back to her baseline nauseous state.  She was started on  erythromycin 250 mg 1 p.o. t.i.d. before meals, which is uncertain if  she has had any significant benefit.  On the evening of admission, the  patient stated that she had vomited 3 cup full of bright red blood and  possible clots, but she is not certain.  She has been feeling weak all  day and one day prior to admission she noted having some melena.  Overall, she does feel  queasy and extremely fatigued.   PAST MEDICAL HISTORY AND PAST SURGICAL HISTORY:  As stated above.   FAMILY HISTORY:  Noncontributory.   SOCIAL HISTORY:  Negative for alcohol, tobacco, or illicit drug use.   REVIEW OF SYSTEMS:  As stated above in the history of present illness.  Otherwise, negative.   ALLERGIES:  Allergies to VANCOMYCIN, IV DYE, NSAIDS, and possibly IV  IRON.   PHYSICAL EXAMINATION:  VITAL SIGNS:  Vital signs are pending at this  time.  GENERAL:  The patient is in no acute distress.  Alert and oriented.  HEENT:  Normocephalic and atraumatic.  Extraocular muscles are intact.  NECK:  Supple.  No lymphadenopathy.  LUNGS:  Clear to auscultation bilaterally.  CARDIOVASCULAR:  Regular rate and rhythm.  ABDOMEN:  Obese, soft, and diffuse tenderness.  No rebound or rigidity.  Positive bowel sounds.  EXTREMITIES:  No clubbing, cyanosis, or edema.  There is a bruit that is  auscultated in the right wrist region that is consistent with her AV  graft.   LABORATORY DATA:  Laboratory values are pending at this time.   IMPRESSION:  1. Hematemesis.  2. Recent history of peptic ulcer disease.  3. End-stage renal disease.  4. Status post AV fistula placement 1 week ago.   In light of her current symptoms, a repeat EGD will be required at this  time.  She is hemodynamically stable and I believe the next important  step is to obtain a hemoglobin value.  Certainly, if she is below 8,  then she will receive 2 units of packed red blood cells.  Further  recommendations will be made in regards to the GI issue, pending the  EGD.  With her renal issue, I will ask renal to consult in the a.m. for  further assistance in management of these issues.      Tory Emerald Benson Norway, MD  Electronically Signed     PDH/MEDQ  D:  08/05/2008  T:  08/06/2008  Job:  [id number]   cc:   Darrold Span. Florene Glen, M.D.

## 2011-05-08 NOTE — Op Note (Signed)
[name], [name]              ACCOUNT NO.:  [id number]   MEDICAL RECORD NO.:  [id number]          PATIENT TYPE:  INP   LOCATION:  5023                         FACILITY:  [facility]   PHYSICIAN:  Theotis Burrow IV, MDDATE OF BIRTH:  [birth year]   DATE OF PROCEDURE:  07/30/2008  DATE OF DISCHARGE:  07/30/2008                               OPERATIVE REPORT   PREOPERATIVE DIAGNOSIS:  End-stage renal disease.   POSTOPERATIVE DIAGNOSIS:  End-stage renal disease.   PROCEDURE PERFORMED:  Right wrist fistula.   ANESTHESIA:  General.   FINDINGS:  Heavily calcified radial artery.   PROCEDURE:  The patient was identified in the holding area and taken to  room 6.  She was placed supine on the table.  The right arm was prepped  and draped in standard sterile fashion.  MAC anesthesia was  administered; however, the patient did not tolerate this.  This was  converted to a general.  Using ultrasound, the cephalic vein was  identified.  A transverse incision was made between the cephalic vein  and the radial artery.  Cephalic vein was then identified, encircled  with Vesseloops, mobilized proximally and distally.  Branches were  divided with 3-0 silk ties.  Once adequate length on the cephalic vein  was then obtained, it was marked with an ink pen for appropriate  orientation.  Next, the radial artery was identified and mobilized  proximally and distally.  The radial artery was found to be extremely  calcified.  I contemplated not performing the anastomosis at this level  due to the calcification; however, I did feel that I was able to find a  soft spot in the artery and could perform it.  At this point, the  patient was given 2000 units of heparin.  The distal end of the vein was  ligated with a 3-0 silk tie.  I tried to advance a #2 coronary dilator  through the vein, however, met resistance.  I continued to open the  vein, and it was thickened at this level.  I open the vein back to where  it appeared to be healthy.  A #3 Fogarty catheter was passed up the  cephalic vein without resistance.  The vein tolerated a #3 dilator.  Next, the artery was occluded with vascular clamps.  It was opened with  a #11 blade.  Having spatulated the end of the vein, I had performed an  end-to-side anastomosis with a running 6-0 Prolene.  Prior to completion  of anastomosis, the artery was flushed in antegrade and retrograde  fashion.  The anastomosis was secured and clamps were released.  I ws  then able to Doppler a thrill within the cephalic vein up the level of  the elbow.  The patient had a Doppler signal in a radial artery distal  to the anastomosis.  At this point, the patient was given 25 mg of  protamine.  Wound was then irrigated.  It was closed in two layers of 3-  0 and 4-0 Vicryl.  Dermabond was then placed.  The patient tolerated the  procedure well with no  complications.  She was taken to the recovery  room in stable condition.           ______________________________  V. Leia Alf, MD  Electronically Signed     VWB/MEDQ  D:  07/30/2008  T:  07/31/2008  Job:  [id number]

## 2011-05-08 NOTE — Op Note (Signed)
[name], [name]              ACCOUNT NO.:  [id number]   MEDICAL RECORD NO.:  [id number]          PATIENT TYPE:  OIB   LOCATION:  [id number]                         FACILITY:  [facility]   PHYSICIAN:  Rosetta Posner, M.D.    DATE OF BIRTH:  10/24/1976   DATE OF PROCEDURE:  09/14/2008  DATE OF DISCHARGE:                               OPERATIVE REPORT   PREOPERATIVE DIAGNOSIS:  Chronic renal insufficiency with failing renal  transplant.   POSTOPERATIVE DIAGNOSIS:  Chronic renal insufficiency with failing renal  transplant.   PROCEDURE:  Right upper arm arteriovenous fistula creation.   SURGEON:  Rosetta Posner, M.D.   ASSISTANT:  Jacinta Shoe, P.A.-C.   ANESTHESIA:  General endotracheal.   COMPLICATIONS:  None.   DISPOSITION:  To recovery room, stable.   PROCEDURE:  The patient was taken to the operating room and placed in  the supine position.  The area of the right arm was prepped and draped  in sterile fashion.  Using local anesthesia, incision was made over the  prior antecubital space, carried down and isolated the brachial artery,  which was good of caliber.  The cephalic vein was identified through the  same incision and was of excellent caliber.  The vein was mobilized  proximally and distally, and tributary branches were ligated with 3-0  silk ties and divided.  The vein was ligated distally and divided.  It  was mobilized at the level of the brachial artery.  The artery was  occluded proximally and distally, and was opened with 11 blade and  extended with Potts scissors.  The vein was cut in appropriate length.  It was spatulated and sewn into side to the vein with running 6-0  Prolene suture.  Clamps were removed and excellent thrill was noted  through the fistula.  The wounds were irrigated with saline.  Hemostasis  with electrocautery.  Wounds were closed with 3-0 Vicryl in the  subcutaneous with subcuticular stitches.  Benzoin and Steri-Strips were   applied.      Rosetta Posner, M.D.  Electronically Signed     TFE/MEDQ  D:  09/14/2008  T:  09/15/2008  Job:  [id number]

## 2011-05-08 NOTE — H&P (Signed)
[name], [name]              ACCOUNT NO.:  [id number]   MEDICAL RECORD NO.:  [id number]          PATIENT TYPE:  INP   LOCATION:  5023                         FACILITY:  [facility]   PHYSICIAN:  James L. Deterding, M.D.DATE OF BIRTH:  1976-02-24   DATE OF ADMISSION:  07/27/2008  DATE OF DISCHARGE:                              HISTORY & PHYSICAL   NEPHROLOGIST:  Darrold Span. Florene Glen, M.D.   CHIEF COMPLAINT:  Nausea, vomiting and diarrhea.   HISTORY OF PRESENT ILLNESS:  This is a 35 year old female with history  of chronic end stage renal disease status post transplant, also, with  type 2 diabetes and hypertension and ulcerative colitis and now  gastroparesis who presents with a 6-day history of nausea, vomiting and  abdominal pain and also with diarrhea.  The patient states vomiting and  diarrhea have been going on for two years, and have felt worse over the  last several days.  Vomiting is nonbilious and looks like food when it  comes up.  She says she vomits about one time per day.  Diarrhea is  watery like mud and never formed.  The patient states she has 8-10  episodes a day.  The patient denies bright red blood per rectum but does  endorse black, tarry stools.  The patient denies fevers or chills  recently.  Of note, the patient also reports having pain with defecation  and relief afterwards at times.  She states she has had several of these  symptoms in the past but recently has gotten worse, and the patient  reports that she has come to the ED because she is getting dizzy now.   ALLERGIES:  VANCOMYCIN, IV DYE AND NSAID OR IV IRON.   MEDICATIONS:  1. Asacol 24-100 mg b.i.d.  2. Cellcept 100 mg p.o. b.i.d.  3. Clonidine 0.2 mg q.h.s.  4. Glipizide 5 mg b.i.d.  5. Neurontin 200 mg q.h.s.  6. Norvasc 15 mg daily.  7. Actos 30 mg daily.  8. Zoloft 50 mg daily.  9. Zofran 8 mg t.i.d.  10.Zocor 20 mg daily.  11.Vistaril 25 mg daily.  Of note, Zocor and Vistaril were stopped at      last office visit.  12.Sodium bicarbonate 650 mg one p.o. t.i.d.  13.Protonix 40 mg one p.o. b.i.d.  14.Phenergan 25 mg p.r.n.  15.Compazine 10 mg q.4 h p.r.n.  16.Ventricor 6 mg daily to decrease at 3 mg this week to complete a      two month taper for her ulcerative colitis per [facility]      Gastroenterology.  17.Acyclovir 200 mg five times per day times four more days.  18.Xanax 0.5 mg one p.o. b.i.d.  19.Lomotil two tablets four times a day.  20.NuvaRing monthly.  21.Ambien 10 mg q.h.s.  22.Prograf 3 mg b.i.d.  23.Prednisone 10 mg daily.  24.Calcitriol 1 mcg q.o.d.  25.Procrit q.weekly per patient.   PAST MEDICAL HISTORY:  1. End stage renal disease status post Cadaveric renal transplant in      1999 with a baseline creatinine of 3.7.  The patient states she has  been off dialysis for 11 years and she is on a list for another      kidney transplant at [facility] given evidence of rejection.  2. Type 2 diabetes.  3. Hypertension.  4. Hyperlipidemia.  5. Status post right nephrectomy from renal cell carcinoma in 2005.  6. History of depression.  7. GERD.  8. Secondary hyperparathyroidism status post parathyroidectomy for      questionable primary.  9. Anemia per patient last several months.  Baseline hemoglobin had      been 6-7.  10.History of enterococcus UTI.  11.Ulcerative colitis and gastroparesis per [facility] which patient saw      in the last week.   SOCIAL HISTORY:  The patient lives in [place] alone.  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  The patient states she is single without any  children.   Tobacco denies, alcohol denied and drug use denied.  The patient is  currently working on her PHD.  She had a Buyer, retail in [place] and a  Masters in IT sales professional education and literature.   FAMILY HISTORY:  Mother with diabetes, hypertension and heart disease.  Father unknown.  Siblings with anemia and PCOS on glipizide.   REVIEW OF SYSTEMS:  Positive for weight  loss of 17 pounds over the last  month, decreased appetite and fatigue.  Also positive for headaches  which were relieved by Tylenol and positive for dizziness especially  with walking.  Denies vertigo.  Also, positive for urgency and  polyarthralgias diffusely.  For rest of review of systems, per HPI,  otherwise negative.   PHYSICAL EXAMINATION:  VITAL SIGNS:  Temperature 98.0, pulse 80-87,  respirations 16-20, blood pressure 116-134/79, O2 saturation 100% on  room air.  GENERAL:  No acute distress, African American female laying in bed.  HEENT:  Normocephalic, atraumatic.  Equal ocular.  Extraocular movements  intact.  Pupils equal, round and reactive to light.  Sclerae intact.  Nares without discharge.  Mucous membranes moist.  Oropharynx without  erythema or edema or exudate.  NECK:  Supple without lymphadenopathy.  CARDIOVASCULAR:  Regular rate and rhythm, normal S1, S2.  No murmurs,  rubs or gallops.  Normal PMI.  Left radial pulse absent.  Rest of  peripheral pulses 2+.  LUNGS:  Clear to auscultation bilaterally.  No crackles or wheezing.  SKIN:  No rash or lesions.  GI:  Abdomen soft, nontender, nondistended.  Normoactive bowel sounds.  No rebound, no guarding.  No hepatosplenomegaly.  Positive for CVA  tenderness bilaterally.  Also, positive for slight tenderness on deep  palpation of right lower quadrant .  EXTREMITIES:  No cyanosis, clubbing or edema.  Access:  Old left AV  graft and current right antecubital peripheral IV.  MUSCULOSKELETAL:  No joint deformities.  NEUROLOGICAL:  Alert and oriented x3, cranial nerves II-XII grossly  intact.  Sensation and motor intact as well.   LABORATORY DATA:  White blood cell count 9.7, hemoglobin 6.7, hematocrit  20.5, platelets 352, neutrophils 83%, MCV 85.2, RDW 18.1.  Of note,  hemoglobin on February 2009 was 11.5.  Sodium 137, potassium 3.8,  chloride 104, bicarbonate 26, glucose 102, BUN 42, creatinine 5.05,  estimated GFR to  about 12, calcium 11.5, total bilirubin 0.3, alk-phos  31, AST 10, ALT less than 8, lipase 33, albumin 3.4, protein 6.1.   ASSESSMENT/PLAN:  This is a 35 year old female with history of stage V  chronic kidney disease status post transplant in 1999 and right  nephrectomy  in 2005.  Also, with history of hypertension, diabetes,  ulcerative colitis, gastroparesis and depression who presented with a  one week history of nausea, vomiting, diarrhea, dizziness who was found  to have a hemoglobin of 6.7.  1. Nausea, vomiting, diarrhea.  Per patient, is a chronic issue.  She      reports acute worsening over the last week.  Question viral      gastroenteritis versus pancreatitis, although, normal lipase.      Also, could be exacerbation of ulcerative colitis versus      gastroparesis.  We will restart Reglan.  The patient had previously      stopped his medication secondary to side effects.  Given diarrhea,      we will hold Lotrimin, and given history patient endorses tarry      stools, concern for ongoing blood loss.  We will check hemoccult.      Also, consider gastroenterology consult for questionable ulcerative      colitis on immunosuppressant medication.  2. Dizziness.  Again concern for blood loss causing symptoms,      although, the patient states this is baseline for her.  Recent      increase of Procrit from 20,000 units to 30,000 units.  Patient      reports compliance.  We will increase this to 40,000 units and      check hemoccults.  3. End stage renal disease.  Creatinine on end of July was 4.18.      Today, it is 5.05.  Patient clinically euvolemic.  Continue home      medications and monitor. heparin lock IV.  Question if patient will      require dialysis in the near future.  Therefore, we will proceed      with vein mapping.  4. Ulcerative colitis.  Continue Asacol and Endicort taper per GI.      Consult GI here.  The patient reports previously seeing Dr. Benson Norway.  5. Diabetes.   Sliding scale insulin.  Continue glipizide and Actos.      Renal diet.  6. Hypertension.  Continue Norvasc.  7. Anemia.  The patient reports she is stable at 6-7 of hemoglobin      over the last several months.  The patient also reports compliance      with Procrit.  We will check hemoccult and increase Procrit to      40,000 units weekly.  8. Other issues per home regimen.  9. Disposition pending clinical improvement.      Ria Bush, MD  Electronically Signed     ______________________________  Joyice Faster. Deterding, M.D.    Martyn Malay  D:  07/28/2008  T:  07/28/2008  Job:  [id number]

## 2011-05-08 NOTE — Discharge Summary (Signed)
[name], [name]              ACCOUNT NO.:  [id number]   MEDICAL RECORD NO.:  [id number]          PATIENT TYPE:  INP   LOCATION:  6736                         FACILITY:  [facility]   PHYSICIAN:  Tory Emerald. Benson Norway, MD    DATE OF BIRTH:  06-Jan-1976   DATE OF ADMISSION:  08/05/2008  DATE OF DISCHARGE:  08/07/2008                               DISCHARGE SUMMARY   1. Hematemesis.  2. Small gastric ulcer, which was friable.  3. Gastroparesis.  4. Status post renal transplantation.  5. Chronic abdominal pain.   HOSPITAL COURSE:  The patient was admitted to the hospital and she  underwent an EGD on the following day.  It was noted on the EGD that her  small gastric ulcers versus erosions were markedly improved with PPI  therapy.  However, one of the prior ulcerations was still friable and  some mild oozing was noted at that time.  There is no evidence of any  old or recent bleeding during the endoscopy.  However in light of her  complaint and her anemia, one hemoclip was deployed onto that area.  She  was also transfused 2 units of packed red blood cells, which she  responded appropriately with a rise in her hemoglobin.  She felt better  after this transfusion, and subsequently, she was able to be discharged  home.   Plan at this time is for the patient to follow up with Dr. Benson Norway in  approximately 1 month and also to follow up at [facility] in regards to  further evaluation of her other GI complaints such as gastroparesis and  possible inflammatory bowel disease and she is to follow up with Dr.  Florene Glen as previously scheduled.      Tory Emerald Benson Norway, MD  Electronically Signed     PDH/MEDQ  D:  08/09/2008  T:  08/10/2008  Job:  [id number]   cc:   Darrold Span. Florene Glen, M.D.

## 2011-05-08 NOTE — Op Note (Signed)
NAME:  [name], [name]              ACCOUNT NO.:  [id number]   MEDICAL RECORD NO.:  [id number]          PATIENT TYPE:  AMB   LOCATION:  DAY                          [facility]ACILITY[facility]  [facility]   PHYSICIAN:  Adin Hector, MD     DATE OF BIRTH:  [birth year]   DATE OF PROCEDURE:  09/03/2008  DATE OF DISCHARGE:  09/03/2008                               OPERATIVE REPORT   PRIMARY CARE PHYSICIAN:  Unavailable.   SURGEON:  Adin Hector, MD.   ASSISTANT:  None.   PREOPERATIVE DIAGNOSIS:  Left axillary abscess with history of  methicillin resistant Staphylococcus aureus .   POSTOPERATIVE DIAGNOSIS:  Left axillary abscess with history of  methicillin resistant Staphylococcus aureus .   PROCEDURE:  Incision and drainage of left axillary abscess.   ANESTHESIA:  1. General anesthesia.  2. Quarter percent bupivacaine with epinephrine in a field block.   SPECIMENS:  Pus from left axilla.   DRAINS:  Half inch Nugauze goes into the axillary wound.   ESTIMATED BLOOD LOSS:  Less than 5 mL.   COMPLICATIONS:  None.   INDICATIONS FOR PROCEDURE:  [name] is a 35 year old female with  numerous health issues including end-stage renal disease and renal  transplant on chronic immunosuppression with a history of recurrent skin  abscesses, last MRSA positive in 2005.  She had some left axillary  swelling for the past 3 weeks which has never drained.  The pain has  become so intense she was fitted into our clinic today.  Recommendation  was made for incision and drainage.  She was too uncomfortable to  adequately do in the office and therefore Dr. Marisue Humble requested that I  perform this in the operating room.  Pathophysiology of abscesses was  discussed.  Technique of excision was discussed.  Risks, benefits and  alternatives discussed and she agrees to proceed.   OPERATIVE FINDINGS:  She had a 2 x 2 x 4 cm ellipsoid region of fibrosis  with about 3 mL of frank pus within it.  She had thick pits  concerning  for possible hidradenitis suppurativa.   DESCRIPTION OF PROCEDURE:  Informed consent was confirmed.  The patient  underwent general anesthesia without any difficulty.  She received IV  cefazolin during the case.  Her left axilla was prepped and draped in  sterile fashion.   I used an 18 gauge needle into the center of the mass and encountered an  abscess cavity.  I aspirated frank pus that was sent for aerobic and  anaerobic culture.  I went ahead and opened up the incision with about a  1.5 cm incision and got into a cavity.  I ended up excising some of the  wall to get a nice elliptical wound.  Cautery was used for good  hemostasis.  Inspection was made and there were no other pockets that  were freed up.  I went ahead and packed the wound with 1/4 inch iodoform  Nugauze into the axilla for several centimeters to have a good packing.  Sterile dressing was applied.  The patient was extubated and sent to  the  recovery room in stable condition.  I had discussed postop care with the  patient but I also reinforced it with her family as well.      Adin Hector, MD  Electronically Signed     SCG/MEDQ  D:  09/03/2008  T:  09/05/2008  Job:  [id number]

## 2011-05-08 NOTE — Discharge Summary (Signed)
NAMEBETEL, [name]              ACCOUNT NO.:  [id number]   MEDICAL RECORD NO.:  [id number]          PATIENT TYPE:  INP   LOCATION:  5530                         FACILITY:  [facility]   PHYSICIAN:  Neysa Bonito, MD  DATE OF BIRTH:  May 09, 1976   DATE OF ADMISSION:  06/10/2009  DATE OF DISCHARGE:  06/17/2009                               DISCHARGE SUMMARY   PRIMARY NEPHROLOGIST:  Darrold Span. Florene Glen, MD, and [facility].   CHIEF COMPLAINT AND BRIEF HISTORY OF PRESENT ILLNESS:  A 35 year old  pleasant African American female with history of end-stage renal  disease, status post kidney transplant in 1999, presented with chest  pain.   HOSPITAL COURSE:  1. Chest pain.  The patient was ruled out for acute coronary syndrome      by serial cardiac enzymes and EKGs.  The patient has no chest pain      complaint as of now.  2. End-stage renal disease.  The patient is status post cadaveric      kidney transplant with allograft dysfunction.  Currently, she is      CKD IV-V.  The patient was continued on Prograf and CellCept plus      prednisone during this hospital stay.  The patient was advised to      follow up with Dr. Florene Glen and the Transplant Service after      discharge.  3. Unsteady gait and neuropathy.  The patient will be referred to      Neurology for possible nerve conduction study/reevaluation of this      neuropathy diagnosis.  The patient had MRI of the brain to rule out      intracranial pathology resultant, which can result or explain her      symptoms.  Her MRI is negative.  4. Hypertension, remained stable during the hospital stay.  5. Diabetes, very good control.   She will be discharged to continue on Actos as prior to admission.   HOSPITAL PROCEDURES:  1. Radiology, MRI of the brain on June 16, 2009.  Impression:      a.     Negative noncontrast MRI appearance of the brain.      b.     Paranasal sinus and mastoid inflammatory  change.  2. CT abdomen and pelvis on June 15, 2009.  Impression:  Showing no      acute intra-abdominal abdominal pathology.  3. Chest x-ray on June 10, 2009.  The patient is in no acute disease.   DISCHARGE MEDICATIONS:  1. CellCept 500 mg twice day.  2. Prednisone 10 mg daily.  3. Sodium bicarbonate 650 three times a day.  4. Os-Cal 500 mg 3 times a day.  5. Prograf 3 mg twice a day.  6. Calcitriol 1.5 mcg daily.  7. Zocor 20 mg at bedtime.  8. Glipizide 5 mg, hold if glucose is less than 100 daily.  9. Zoloft 100 mg daily.  10.Norvasc 10 mg, hold if blood pressure less than 100.  11.Actos 30 mg, hold if glucose is less than 100.  12.Neurontin 300 mg  p.o. q.8 h.  13.Ambien 20 mg at bedtime.  14.Vistaril 25 mg 3 times a day.  15.Xanax 1 mg at bedtime.  16.Zofran 4 mg every 6 hours p.r.n.  17.Phenergan 50 mg as needed.  18.Flexeril 5 mg at bedtime.  19.Claritin D every 12 hours.   DISCHARGE DIAGNOSES:  1. Chest pain atypical for coronary artery disease.  The patient ruled      out for acute coronary syndromes.  2. End-stage renal disease, status post cadaveric kidney transplant in      1999, currently is  stage V chronic kidney disease.  3. Hypertension.  4. Diabetes.  5. Hyperlipidemia.  6. Status post right nephrectomy due to renal cell carcinoma in 2006.  7. Depression.  8. Reflux disease.  9. Hyperparathyroidism.  10.Anemia of chronic disease.  11.History of ulcerative colitis and gastroparesis.  12.History of enterococcal urinary tract infection.  13.Gastric ulcer disease.   DISCHARGE PLAN:  The patient will need home aid per her request  secondary to the severe weakness and the diagnosis of nephropathy.  The  patient is not willing to be discharged to assisted living facility, and  we will obtain physical therapy assessment for the skilled nursing  facility needs.  She is aware and agreeable to the discharge and  followup plan.      Neysa Bonito, MD   Electronically Signed     EME/MEDQ  D:  06/17/2009  T:  06/17/2009  Job:  [id number]

## 2011-05-08 NOTE — Discharge Summary (Signed)
[name], [name]              ACCOUNT NO.:  [id number]   MEDICAL RECORD NO.:  [id number]          PATIENT TYPE:  INP   LOCATION:  5023                         FACILITY:  [facility]   PHYSICIAN:  James L. Deterding, M.D.DATE OF BIRTH:  1976/12/14   DATE OF ADMISSION:  07/27/2008  DATE OF DISCHARGE:  07/30/2008                               DISCHARGE SUMMARY   DISCHARGE DIAGNOSES:  1. Nausea and vomiting, thought to be secondary to gastroparesis.  2. Stage IV-V chronic kidney disease  3. Anemia of chronic kidney disease.  4. Diabetes.  5. Hypertension.  6. Hypercalcemia.  7. Status post renal transplant.  8. Hyperlipidemia.  9. Depression.  10.Gastroesophageal reflux disease.  11.Secondary hyperparathyroidism status post parathyroidectomy.  12.Questionable ulcerative colitis.   DISCHARGE MEDICATIONS:  1. Procrit 30,000 units twice weekly, increased from 30,000 units once      weekly.  2. Erythromycin 250 mg before meals, new.  3. Reglan 10 mg 1 p.o. before meals.  4. Entocort 3 mg daily for one more month.  5. Prednisone 10 mg daily.  6. Prograf 3 mg b.i.d.  7. Asacol 2400 mg b.i.d.  8. CellCept 500 mg b.i.d.  9. Clonidine 0.2 mg nightly.  10.Glipizide 5 mg b.i.d.  11.Neurontin 300 mg at bedtime.  12.Norvasc 15 mg daily.  13.Zoloft 50 mg daily.  14.Zofran 8 mg 1 p.o. t.i.d.  15.Sodium bicarb 650 mg 1 p.o. t.i.d.  16.Protonix 40 mg 1 p.o. b.i.d.  17.Phenergan 25 mg 1 p.o. q.6 h p.r.n. nausea.  18.Compazine 10 mg 1 p.o. q.4 h p.r.n. nausea.  19.Acyclovir x1 more day.  20.Xanax 0.5 mg 1 p.o. b.i.d.  21.Lomotil 2 q.i.d.  22.NuvaRing monthly.  23.Percocet for pain.  24.Ambien per patient.   New medicine is erythromycin per GI recommendations and increased  medicine is Procrit 30,000 units twice weekly.  New medicine is Percocet  for pain after fistula placement.   NEPHROLOGIST:  Darrold Span. Florene Glen, MD   CONSULTANT:  1. Gastroenterology, Tory Emerald. Benson Norway, MD  2. [facility],  Dr. Oneida Alar.   PROCEDURES:  1. Upper endoscopy on July 28, 2008, showing superficial ulcers and      no evidence of gastritis, no active bleed.  2. AV fistula in right radiocephalic vein placed, July 30, 2008, for      access for future dialysis.  3. Vein mapping performed, July 29, 2008, in preparation for fistula      placement.   IMAGING:  Renal ultrasound, July 27, 2008, showing normal appearance of  right iliac fossa transplant kidney.   ADMISSION LABS:  White blood cell 9.7, hemoglobin 6.7, hematocrit 20.5,  and platelets 352.  LFTs within normal limits.  Albumin 3.4, sodium 137,  potassium 3.8, chloride 104, bicarb 26, BUN 42, creatinine 5.05, and  calcium 11.5.   DISCHARGE LABS:  Iron 59, TIBC 204, percent saturation 29, B12 of 203,  ferritin 394, potassium 3.5, BUN 41, creatinine 4.7, albumin 3.1,  phosphorus 4.9, and calcium 10.6.  White blood cell 9.7 and hemoglobin  8.7.   HOSPITAL COURSE:  For full summary, please see dictated H&P.  In  short,  this is a 35 year old female with history of chronic kidney disease  status post renal transplant and right nephrectomy secondary to renal  cell carcinoma, also with history of type 2 diabetes, hypertension, and  gastroparesis who presented with increased creatinine, nausea, and  vomiting and was found to be anemic to 6.7.  1. Chronic kidney disease.  Creatinine on presentation was 5.  This      was increased from baseline of approximately 3.7.  Per the patient,      she was on a list for another of kidney transplant at [facility]      but is years away from this.  There was concern for failure of      transplant due to transplanted kidney, but renal ultrasound showed      normal kidney.  Detailed discussion was had with the patient      regarding progression of kidney disease and need for access as the      patient's last need for hemodialysis was over 11 years ago with two      old grafts in the left arm that are  occluded.  The patient      initially hesitant to consider placement of access but upon further      discussion decided to proceed and underwent right radiocephalic      fistula placement prior to discharge by [facility].  The patient presented      also with hypercalcemia, so Os-Cal and calcitriol were held.  This      merits follow up as outpatient, and the patient is scheduled to      undergo lab visit on the first of next week at [place].  2. Nausea and vomiting.  The patient with questionable history of      ulcerative colitis and history of gastroparesis being followed by      [facility].  The patient also has a gastroenterologist local Dr. Benson Norway, who      was consulted during this hospitalization.  The patient underwent      EGD which was nonrevealing.  Dr. Benson Norway believed that gastroparesis      was contributing to nausea and vomiting, so he recommended adding      erythromycin to regimen.  This was done after determining normal QT      by EKG.  The patient to follow up with Dr. Benson Norway as needed and to      continue home regimen of Reglan, Phenergan, Zofran, as well as new      medication of erythromycin.  The patient states she also has a      followup at [facility] in end of August.  Prior to discharge, the      patient's nausea improved and vomiting had resolved.  3. Anemia.  The patient presented with a hemoglobin 6.7.  Looking at      clinic notes, hemoglobin had been low for last several visits.  It      was decided to transfuse the patient 2 units which she tolerated      well.  Hemoglobin went up to 9.1 and on discharge was 8.7.  The      patient was given Aranesp during hospitalization, and it was      decided to increase her EPO was dose to 30,000 units twice weekly.      This merits follow up as outpatient.  B12 was noted be low, and it      was recommended  that the patient continue multivitamin with B12 in      it.  4. Diabetes.  The patient's home regimen included Actos and  glipizide.      The patient's Actos was held, and she tolerated this well during      hospitalization.  So, it was decided to hold this medication upon      discharge.  5. Hypertension.  The patient was well controlled with Norvasc home      regimen.   FOLLOWUP:  1. The patient to follow up with Dr. Florene Glen on August 16, 2008, at      9:00 a.m.  The patient knew our appointment.  2. The patient to go to [facility] on Monday, August 02, 2008,      at 10:00 a.m. for blood draw to include CBC, CMET, and phosphate      level.      Ria Bush, MD  Electronically Signed     ______________________________  Joyice Faster. Deterding, M.D.    Martyn Malay  D:  07/30/2008  T:  07/31/2008  Job:  [id number]   cc:   Dr. Florene Glen

## 2011-05-08 NOTE — H&P (Signed)
NAME:  [name], [name] NO.:  [id number]   MEDICAL RECORD NO.:  [id number]          PATIENT TYPE:  OBV   LOCATION:  [id number]                         FACILITY:  [facility]   PHYSICIAN:  Jana Hakim, M.D. DATE OF BIRTH:  April 02, 1976   DATE OF ADMISSION:  06/10/2009  DATE OF DISCHARGE:                              HISTORY & PHYSICAL   PRIMARY CARE PHYSICIAN:  Dr. Lottie Dawson of [facility].   NEPHROLOGIST:  Dr. Erling Cruz of [facility]   CHIEF COMPLAINT:  Chest pain.   HISTORY OF PRESENT ILLNESS:  This is a 35 year old female who presents  to the emergency department with complaints of constant chest pain which  she reports has been occurring for the past 3 days.  She describes the  pain as being a 9 out of /10 at the worst and reports that the pain is  throbbing and radiating into her neck and jaw areas.  She also reports  having pain that she feels in her hands and in her feet which are  tingling and burning.  She also reports being unable to walk secondary  to the discomfort in her feet.  The patient reports having shortness of  breath on occasion with the pain.  She denies having any dizziness,  lightheadedness or syncope.  She denies having any nausea, vomiting or  diaphoresis.  The patient also denies having any fever, chills or  congestion symptoms.   PAST MEDICAL HISTORY:  Significant for a renal transplant performed in  February 1999 at [facility] secondary to a  history of glomerulonephritis and CMV infection as well as renal cell  carcinoma and is status post right nephrectomy.  The patient also has a  history of Crohn's disease, type 2 diabetes mellitus, end-stage renal  disease, gastroesophageal reflux disease, anemia, anxiety, and obsessive-  compulsive disorder.   PAST SURGICAL HISTORY:  Also includes a parathyroidectomy, bilateral  tubal ligation, a right knee ACL tear repair and meniscal repair and a  right upper extremity AV fistula placement.   MEDICATIONS:  Include  CellCept, Prograf, Calcitriol,  Vistaril, Xanax,  Zofran, sodium bicarbonate, calcium, glipizide, Norvasc, Zocor, Zoloft,  prednisone, Neurontin, Claritin-D, Ambien, Flexeril.   ALLERGIES:  1. Dilaudid.  2. IV dye / iodine.  3. Vancomycin, IV iron.  Of note, the vancomycin gives her red man      syndrome and she takes this with premedication of Benadryl when      required.   SOCIAL HISTORY:  The patient is a nonsmoker, nondrinker.   FAMILY HISTORY:  Positive for hypertension, diabetes and heart disease  in her mother.  Her sister has polycystic ovarian syndrome.   REVIEW OF SYSTEMS:  Pertinents are mentioned in the HPI.  All other  organ systems are negative.   PHYSICAL EXAMINATION:  This is a 35 year old morbidly obese female in no  visible discomfort or acute distress.  VITAL SIGNS:  Temperature 98.7, blood pressure 108/58, heart rate 91,  respirations 20, O2 saturations 97-100%.  HEENT:  Normocephalic, atraumatic.  There is no scleral icterus.  Pupils  are equal,  round, reactive to light.  Extraocular movements are intact.  Funduscopic benign.  Nares are patent bilaterally.  Oropharynx  reveals  white tongue exudate.  There is mild erythema.  NECK:  Supple, full  range of motion.  No thyromegaly, adenopathy, jugular venous distention.  CARDIOVASCULAR:  Regular rate and rhythm.  No murmurs, gallops or rubs.  LUNGS:  Clear to auscultation bilaterally.  Chest wall is nontender to  palpation.  ABDOMEN:  Positive bowel sounds, soft, nontender, nondistended.  No  hepatosplenomegaly.  EXTREMITIES:  Without cyanosis, clubbing or edema.  NEUROLOGIC:  The patient is alert and oriented x3.  The patient is able  to move all four of her extremities. Her cranial nerves are intact.  Motor and sensory function also intact.   LABORATORY STUDIES:  Sodium 133, potassium 4.7, chloride 110, CO2 16,  BUN 50, creatinine 5.1 and  glucose 324.  Point care cardiac markers with  a myoglobin of 169, CK-MB less than 1.0, troponin less than 0.05.  EKG  reveals a normal sinus rhythm without acute ST-segment changes and chest  x-ray reveals no acute disease findings.   ASSESSMENT:  A 35 year old female being admitted with:  1. Chest pain.  2. End-stage renal disease.  3. Hyperglycemia with type 2 diabetes mellitus.  4. Oral candidiasis and possibly esophageal candidiasis.   PLAN:  The patient will be admitted to telemetry area for cardiac  monitoring.  Cardiac enzymes will be performed..  At this particular  time it is felt that this is possibly noncardiac pain.  The patient will  be medicated with aspirin.  Her blood pressure is not high enough at  this time for nitroglycerin therapy.  She will be medicated for pain and  her enzymes will be monitored.  The patient's medications have been  reviewed and will be continued.  The patient will be placed on therapy  for oral candidiasis.  This will be adjusted due to her renal function.  The patient will be placed on DVT and GI prophylaxis.  Further workup  will ensue pending results of the patient's clinical studies.      Jana Hakim, M.D.  Electronically Signed     HJ/MEDQ  D:  06/10/2009  T:  06/10/2009  Job:  [id number]

## 2011-05-08 NOTE — Discharge Summary (Signed)
[name], [name]              ACCOUNT NO.:  [id number]   MEDICAL RECORD NO.:  [id number]          PATIENT TYPE:  INP   LOCATION:  3708                         FACILITY:  [facility]   PHYSICIAN:  Durwin Nora, MDDATE OF BIRTH:  02-23-1976   DATE OF ADMISSION:  09/28/2008  DATE OF DISCHARGE:  10/01/2008                               DISCHARGE SUMMARY   DISCHARGE DIAGNOSES:  1. Upper gastrointestinal bleed.  2. Acute on chronic anemia with acute blood loss.  3. Diabetes, stable.  4. Dyslipidemia.  5. End-stage renal disease, status post renal transplant.  6. History of Crohn disease.  7. Hypertension, stable.  8. Dehydration, resolved.  9. Metabolic acidosis, resolved.  10.Peptic ulcer disease.   CONSULTATION:  Dr. Carol Ada, Gastroenterology.   PROCEDURE:  Esophagogastroduodenoscopy, EGD was performed on this  patient on September 30, 2008.   FINDINGS:  Pyloric channel ulcers, antral erosions, hiatal hernia.   RADIOLOGY:  The abdominal ultrasound on September 28, 2008, showed  nonspecific bowel distention.  Chest x-ray shows no pulmonary congestion  or infiltrate.   HOSPITAL COURSE:  This 35 year old African-American female presented to  the emergency room complaining of nausea, vomiting, and abdominal pain  which had been ongoing for 2 days.  The patient also had chronic  diarrhea recently.  At presentation, she had positive Hemoccult,  metabolic acidosis and acute GI bleeding.  Hemoglobin was low at 7.5.  She did have transfusion with 1 unit of packed red blood cells initially  prior to being scoped.  After the endoscopy showed the GI bleed was  inactive, however, she did get a second transfusion on the day of  discharge.  In total, she had a total of 2 units of blood transfused  during this admission.   DISCHARGE CONDITION:  Stable.   DIET:  Renal.   ACTIVITY:  To increase slowly as tolerated.   FOLLOWUP:  1. With primary care physician Dr. Williemae Area in  the next 3-5 days.  2. Followup with Dr. Benson Norway, Gastroenterology, in the next 1-2 weeks.   MEDICATIONS ON DISCHARGE:  She is to continue on her previous home  medication.  In addition, she will be on:  1. Prilosec 20 mg twice daily.  2. Iron sulfate 325 mg twice daily.   PHYSICAL EXAMINATION:  GENERAL:  Today, this is a young lady, not in  acute respiratory distress, pale, clinically not jaundiced.  VITAL SIGNS:  Temperature is 98, pulse 72, respiratory rate is 20, and  blood pressure 122/52.  HEENT:  Head is atraumatic and normocephalic. Mucous membranes are  moist.  Oropharynx and nasopharynx are clear.  NECK:  Supple.  There is no JVD.  CHEST:  Clinically clear.  No rales or rhonchi.  CARDIAC:  S1, S2, regular rate and rhythm.  No rubs, gallops, or  murmurs.  ABDOMEN:  Soft and nontender.  No organomegaly.  EXTREMITIES:  Peripheral pulses present.  No pedal edema.  NEUROLOGIC. grossly normal.   LABORATORY DATA:  WBC 11.4,  Chemistry:  Sodium 137, potassium 2.3,  chloride 102, bicarbonate 85, glucose 107, BUN 39, creatinine 4.3  Because of the mild hypokalemia noted, the patient is to be given 20 mEq  of potassium chloride prior to discharge.  She will have repeat CBC and  BMP as outpatient with the primary care physician on October 11, in the  next 2 days.  The blood culture is negative to date.      Durwin Nora, MD  Electronically Signed     MIO/MEDQ  D:  10/01/2008  T:  10/02/2008  Job:  [id number]   cc:   Tory Emerald. Benson Norway, [place]

## 2011-05-08 NOTE — Assessment & Plan Note (Signed)
OFFICE VISIT   [name], [name]  DOB:  1976-11-06                                       10/15/2008  [id number]   The patient presents today for followup of her fistula creation on  09/14/2008.  She reports that she is having soreness in her entire upper  arm.  I am unclear as to this since she is not having any steal  symptoms.  She does have excellent maturation and excellent thrill in  her right upper arm AV fistula 1 month out from her creation.  This does  run somewhat deep in the subcutaneous fat.  The patient reports that she  has continued to maintain creatinine in the 4 level and has not been  told that there is any imminent need for hemodialysis.  I explained to  the patient that I suspect she will have continued maturation with  excellent size in her vein.  I explained, however, that I am concerned  that her vein runs somewhat deep under the subcutaneous tissue, and  there may be difficulty with the dialysis staff physically accessing the  fistula.  I did explain the potential possibility of mobilization of the  vein to more superficial subcutaneous level for long term access.  We  will discuss this as she approaches hemodialysis.  She is not scheduled  for any specific followup with me.  We would request that Dr. Florene Glen  would send her back for evaluation when she is more closely approaching  the need for dialysis.  I would certainly hold off any attempt at  mobilization for at least an additional 1 month to give her 2 months of  maturation of her fistula.   Rosetta Posner, M.D.  Electronically Signed   TFE/MEDQ  D:  10/15/2008  T:  10/18/2008  Job:  [id number]   cc:   Darrold Span. Florene Glen, M.D.

## 2011-05-08 NOTE — Consult Note (Signed)
NAME:  [name], [name] NO.:  [id number]   MEDICAL RECORD NO.:  [id number]          PATIENT TYPE:  OBV   LOCATION:  5530                         FACILITY:  [facility]   PHYSICIAN:  Donato Heinz, M.D.DATE OF BIRTH:  01-18-1976   DATE OF CONSULTATION:  06/14/2009  DATE OF DISCHARGE:                                 CONSULTATION   CONSULTING PHYSICIAN:  Cherene Altes, MD of [facility].   REASON FOR CONSULTATION:  Electrolyte abnormalities, hypertension, and  chronic allograft nephropathy.   HISTORY OF PRESENT ILLNESS:  [name] is a 35 year old African  American female who is well-known to our practice with past medical  history significant for end-stage renal disease, status post cadaveric  kidney transplant in 1999 at [facility].  She has had some chronic  allograft dysfunction and has had advanced chronic kidney disease who  was admitted to [facility] on June 10, 2009, with substernal chest  pain radiating to her neck and jaw.  She reports that it is constant and  had been escalating for 3 days prior to admission and she also had  numbness, tingling, and burning sensation in her hands and feet as well  as unsteadiness in her gait.  She was admitted for further evaluation  and treatment.  We were asked to see the patient to help manage her end-  stage renal disease and chronic allograft nephropathy as well as follow  along with her other electrolyte abnormalities and complaints.   ALLERGIES:  Her allergies are to VANCOMYCIN, IV DYE, NONSTEROIDALS, IV  IRON, and REGLAN.   PAST MEDICAL HISTORY:  As per HPI.  1. 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 Florene Glen      and is currently on kidney transplant at [facility].  2.  Hypertension.  3. Diabetes mellitus.  4. Hyperlipidemia.  5. Status post right nephrectomy due to renal cell carcinoma in 2006.  6. History of depression.  7. Gastroesophageal reflux disease.  8. Secondary hyperparathyroidism, status post parathyroidectomy.  9. Anemia of chronic disease.  10.Ulcerative colitis and gastric paresis.  11.History of enterococcal UTI.  12.Gastric ulcers followed by Dr. Benson Norway.   CURRENT MEDICATIONS:  1. Calcitriol 1 mcg a day.  2. CellCept 500 mg b.i.d.  3. Clonidine 0.1 mg at bedtime.  4. Neurontin 200 mg b.i.d.  5. Prednisone 10 mg a day.  6. Prilosec 20 mg t.i.d.  7. Prograf 3 mg b.i.d.  8. Sodium bicarb 650 mg t.i.d.  9. Zofran 8 mg b.i.d.  10.Zoloft 50 mg a day.  11.Procrit 40,000 units subcu each week.  12.Phenergan 25 mg p.r.n.  13.Norvasc 10 mg a day.  14.Xanax 0.5 mg at bedtime.  15.Calcium carbonate 500 mg with each meal.  16.Lomotil each day.   FAMILY HISTORY AND SOCIAL HISTORY:  Remained unchanged.  Please refer  to  the July 28, 2008, history and physical by Dr. Jimmy Footman.  Of note she  does have an aunt who has multiple sclerosis.   REVIEW OF SYSTEMS:  As per HPI.  GENERAL:  She has had some malaise and  fatigue.  CARDIAC:  She had some substernal chest pain radiating to her  neck and jaws.  GI:  Has chronic diarrhea.  Denies any hematemesis,  hematochezia, melena, or bright red blood per rectum.  GU:  No dysuria,  pyuria, hematuria, urgency, frequency, or retention.  PULMONARY:  No  hemoptysis or productive cough.  RHEUMATOLOGIC:  Occasional back pain.  NEUROLOGIC:  She complains of numbness, tingling, and burning sensation  of her feet and her hands as well as unsteady gait and has recently had  a headache, which is new for her.  All other systems are negative.   PHYSICAL EXAMINATION:  GENERAL:  Well-developed obese female sitting in  bed in no apparent distress.  VITAL SIGNS:  Temperature was 98.7, pulse 96, blood pressure  123/67, and  respiratory rate 18.  HEENT:  Head normocephalic, atraumatic.  Extraocular muscles intact.  No  icterus.  Oropharynx without lesions.  NECK:  Supple.  No lymphadenopathy or bruits.  LUNGS:  Clear to auscultation and percussion bilaterally.  No rales or  rhonchi.  CARDIAC:  Regular rate and rhythm.  No precordial rub appreciated.  ABDOMEN:  Normoactive bowel sounds, soft, mild tenderness to deep  palpation, but no guarding or rebound.  Allograft is in the right lower  quadrant.  No bruits or tenderness appreciated.  EXTREMITIES:  She has a right upper arm AV fistula with palpable thrill  and audible bruit.  She has no pretibial edema.   LABORATORY DATA:  Sodium 139, potassium 4.7, chloride 107, CO2 of 23,  BUN 49, creatinine 4.79, glucose 71, calcium 12.3, albumin 3.5,  phosphorus 7.4.  Her B12 level was 473.  Folic acid was 8.2.  Ferritin  was 1200.  Cortisol was 3.6.   ASSESSMENT AND PLAN:  1. Atypical chest pain.  Cardiac enzymes were negative.  Would      consider gastrointestinal source given her history of thrush prior      to admission as well as history of gastric ulcers.  We will      consider Gastrointestinal evaluation with Dr. Benson Norway for possible      esophagogastroduodenoscopy to rule out these sources since she is      continued to have chest pain, although it is less in severity.  2. 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  Her creatinine is at or below her baseline right now, is 4.79.      She is without uremic symptoms.  We would continue with her current      regimen of Prograf, CellCept, and prednisone.  Prograf trough level      is pending.  3. Neuropathy and unsteady gait.  This is likely due to combination of      medications and diabetes, but also worrisome as her history of      renal cell carcinoma and family history of multiple sclerosis.  We       would recommend consideration of an MRI of brain to rule these out,      although her neurologic exam is nonfocal.  We will continue to  follow.  She is now on Lyrica and off of Neurontin.  We would not      add a combination, but use one or the other.  4. Anemia, on EPO.  We will continue to follow.  5. Hypertension, blood pressure is at goal.  6. Secondary hyperparathyroidism.  She actually has hypercalcemia and      we will hold her calcitriol and PhosLo      for now.  We will start her on a non-calcium based binder such as      Renvela and follow.  7. Diabetes mellitus, glucose has been under very good control.  We      will continue to follow along with you while she remains as an      inpatient.           ______________________________  Donato Heinz, M.D.     JC/MEDQ  D:  06/14/2009  T:  06/15/2009  Job:  [id number]

## 2011-05-08 NOTE — Assessment & Plan Note (Signed)
OFFICE VISIT   [name], [name]  DOB:  Nov 18, 1976                                       04/15/2009  [id number]   The patient presents today for continued discussion regarding AV access.  She has a transplant and is currently not on hemodialysis.  She had an  upper arm AV fistula creation by me in September 2009, she has continued  to have a maturation of this.  She does, on physical exam, have a 2+  radial pulse.  She has a very large cephalic vein fistula beginning at  her antecubital space proximally.  This is rather tortuous and running  throughout her upper arm there are areas where the vein comes to the  surface and then dives somewhat deeper.  On imaging this with  ultrasound, this indeed is the case.  She does have a very large  cephalic vein fistula throughout her upper arm.  The patient, as always,  has a great deal of concern regarding her access.  She is requesting  that we place a graft in her left arm since she had long term success  with a prior left arm graft.  I explained that she had much better  success than is typical and it would be quite inappropriate to place a  prosthetic graft in her left arm which she has an outstanding fistula in  her right arm.  I explained that there would certainly be no reason to  revise her fistula since it looks to be quite good and has not been  accessed.  She reluctantly agrees to this plan.  She reports that Dr.  Florene Glen is not pushing for her to be started on dialysis but she has  selected a date in mid June when she was to initiate dialysis despite  this.  I explained that I would leave this up to her and Dr. Florene Glen.  From my standpoint, she does have an excellent upper arm fistula and  should be ready for use at any time by the hemodialysis staff as needed.   Rosetta Posner, M.D.  Electronically Signed   TFE/MEDQ  D:  04/15/2009  T:  04/18/2009  Job:  [id number]   cc:   Darrold Span. Florene Glen, M.D.

## 2011-05-09 ENCOUNTER — Other Ambulatory Visit (HOSPITAL_BASED_OUTPATIENT_CLINIC_OR_DEPARTMENT_OTHER): Payer: Self-pay | Admitting: Internal Medicine

## 2011-05-09 LAB — GLUCOSE, CAPILLARY
Glucose-Capillary: 123 mg/dL — ABNORMAL HIGH (ref 70–99)
Glucose-Capillary: 209 mg/dL — ABNORMAL HIGH (ref 70–99)

## 2011-05-10 ENCOUNTER — Other Ambulatory Visit (HOSPITAL_BASED_OUTPATIENT_CLINIC_OR_DEPARTMENT_OTHER): Payer: Self-pay | Admitting: Internal Medicine

## 2011-05-10 LAB — GLUCOSE, CAPILLARY
Glucose-Capillary: 195 mg/dL — ABNORMAL HIGH (ref 70–99)
Glucose-Capillary: 213 mg/dL — ABNORMAL HIGH (ref 70–99)

## 2011-05-11 ENCOUNTER — Other Ambulatory Visit (HOSPITAL_BASED_OUTPATIENT_CLINIC_OR_DEPARTMENT_OTHER): Payer: Self-pay | Admitting: Internal Medicine

## 2011-05-11 LAB — GLUCOSE, CAPILLARY
Glucose-Capillary: 193 mg/dL — ABNORMAL HIGH (ref 70–99)
Glucose-Capillary: 275 mg/dL — ABNORMAL HIGH (ref 70–99)

## 2011-05-11 NOTE — Consult Note (Signed)
[facility]  Patient:    [name], [name]                       MRN: [id number] Attending:  Roney Jaffe, M.D. Dictator:   [id number]                          Consultation Report  REASON FOR CONSULTATION:  Stabilization of calcium prior to GYN procedure.  HISTORY:   The patient is a pleasant 35 year old African-American female with end-stage renal disease, who had a cadaveric renal transplant in February, 1999, and has a baseline creatinine of 1.6 as well as chronic hypocalcemia, ranging in the 5 to 7 range.  This is thought to be due to surgical hypoparathyroidism. The patient had previously had a parathyroidectomy for treatment of secondary hyper- parathyroidism with auto transplant.  She has been taking high doses of Os-Cal nd Rocaltrol at home but has continued to have intermittently low serum calcium levels when checked.  She is currently asymptomatic.  PAST MEDICAL HISTORY: 1. ESRD, status post cataract renal transplant, February, 1999; at [facility] in [place], [place]; currently maintained on cyclosporine and prednisone alone. She is not a candidate for Cellcept due to problems with recurrent pneumonia related to CMV pneumonitis last year. 2. 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 3. Hypertension, currently off medications. 4. History of GERD.  MEDICATIONS: Reviewed with the patient: Rocaltrol - she is taking 8.8 0.25 mcg tablets daily for a total of 2 mg q.d.  Calan SR 120 mg b.i.d.  Lasix 20 mg daily if blood pressure is elevated.  Prednisone 10 mg daily.  Ferrous sulfate 325 mg  b.i.d.  Neoral 120 mg b.i.d.  Os-Cal 500 mg three tabs t.i.d. with each meal. ums at night p.r.n. for heartburn.  ALLERGIES:  NSAID, IVP DYE and VANCOMYCIN.  REVIEW OF SYSTEMS:  Noncontributory.  PHYSICAL EXAMINATION:  GENERAL:  The patient is a moderately obese alert African-American female in no  distress.   She is afebrile with stable vital signs.  VITAL SIGNS:  Blood pressure 150/80, heart rate 80, respirations 10 and unlabored.  SKIN:  ______ rash.  HEENT:  Unremarkable.  NECK:  Supple, without JVD.  CHEST:  Clear.  CARDIAC:  Regular rate and rhythm with a 2/6 systolic ejection murmur at the left lower sternal border.  ABDOMEN:  Soft, nontender, without masses.  EXTREMITIES:  No edema.  NEUROLOGIC:  Grossly nonfocal exam.  LABORATORY:  Pending.  IMPRESSION: 1. Elective admission for uterine ablation and bilateral tubal ligation. 2. 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 3. Status post cadaveric renal transplant, with baseline creatinine around 1.6. 4. History of CMV pneumonitis, as above. 5. Hypertension in the past; currently off medications. 6. Hypertension GERD.  PLAN: 1. Will administer continuous calcium infusion using calcium gluconate as well s give high dose intravenous Vitamin-D in the form of Calcijex and check calcium levels this evening and in the morning. 2. Continue home meds. DD:  01/27/99 TD:  01/28/00 Job: [id number]

## 2011-05-11 NOTE — H&P (Signed)
[facility]  Patient:    [name], [name] Visit Number: [id number] MRN: [id number]          Service Type: EMS Location: ED Attending Physician:  Nat Christen Dictated by:   Donetta Potts, M.D. Admit Date:  10/30/2001 Discharge Date: 10/30/2001                           History and Physical  CHIEF COMPLAINT:  Fevers.  HISTORY OF PRESENT ILLNESS:  Ms. [name] is a 35 year old African-American female with past medical history significant for end-stage renal disease, status post cadaveric renal transplantation performed in 1999 with baseline creatinine of 1.6.  She has been complaining of diffuse aches and pains over the last several weeks which was felt to be related to rhabdomyolysis induced by Lipitor.  Since that time she also has been complaining of fevers and chills for the last two days with an increased temperature to 103 today.  The patient took some Tylenol and came to the emergency room to be further evaluated.  She also reports some shortness of breath and dyspnea on exertion as well as some fatigue associated with nausea and vomiting and decreased p.o. intake.  She denies any chest pain or pressure and also denies any productive cough.  She does report some abdominal discomfort that accompanies her nausea and vomiting.  She also aches all over.  PAST MEDICAL HISTORY:  1. End-stage renal disease status post cadaveric kidney transplant.  Baseline     creatinine of 1.6. 2. Hypertension. 3. Hypocalcemia and secondary hypoparathyroidism. 4. Rhabdomyolysis from Lipitor. 5. Obesity. 6. Chronic asthma. 7. Gastroesophageal reflux disease.  CURRENT MEDICATIONS:  1. Rapamune 3 mg q.d.  2. Prednisone 10 mg q.d.  3. Calan SR 120 mg b.i.d.  4. Os-Cal 500 t.i.d.  5. Calcitriol 1 mcg p.o. q.d.  6. Reglan 10 mg b.i.d.  7. Prevacid 30 mg t.i.d.  8. Diflucan 200 mg q.d.  9. Valcyte 450 mg q.d. 10. Bactrim double strength every Monday,  Wednesday, Friday. 11. Phenergan 25 mg p.r.n. nausea. 12. Flovent 1 puff b.i.d. 13. Albuterol metered-dose inhaler p.r.n. 14. Serevent MDI 1 puff b.i.d. 15. Lasix 20 mg q.d.  ALLERGIES:  NSAID and VANCOMYCIN.  FAMILY HISTORY:  Mother is alive at age 4 and in good health, although she does have hypertension.  Father is alive at age 76 in good health.  She has two sisters both of which are in good health.  SOCIAL HISTORY:  She is a [employer].  Denies tobacco or alcohol. Lives by herself.  Denies any history of TB exposure or positive PPD.  REVIEW OF SYSTEMS:  As per HPI.  All other systems negative.  GI:  She does report some diarrhea which is brown.  No blood.  She also reports having some nausea and vomiting with some blood streaked vomitus.  Denies any hematochezia, melena, or bright red blood per rectum.  GU:  Denies dysuria, polyuria, hematuria.  DERMATOLOGIC:  Denies rashes.  NEUROLOGIC:  Denies any weakness, but does have some numbness and tingling of her hands that is intermittent.  Denies any gait abnormality.  HEME:  Denies any abnormal bleeding or bruising.  PULMONARY:  Some shortness of breath and dyspnea on exertion, but no productive cough.  CARDIAC:  Denies any palpitations or chest pain.  OPHTHALMIC:  Denies blurred vision, photophobia.  PHYSICAL EXAMINATION:  VITAL SIGNS:  Temperature 101.6, pulse 93, blood pressure 101/48.  GENERAL:  Well-developed, obese female in no apparent distress.  HEENT:  Head normocephalic, atraumatic.  Oropharynx without lesions.  Pupils equal, round, and reactive to light.  Extraocular muscles are intact.  NECK:  Supple with full range of motion and no lymphadenopathy.  LUNGS:  Clear to auscultation bilaterally, no rales, rubs, or rhonchi.  HEART:  Regular rate and rhythm without precordial rubs appreciated.  ABDOMEN:  Obese, normoactive bowel sounds, soft, nontender.  Renal allograft in right lower quadrant, nontender,  and no bruits.  EXTREMITIES:  Without edema.  LABORATORY:  White blood cell count 10.1, hemoglobin 11.2, platelets 365. Sodium 137, potassium 3.3, chloride 105, bicarb 23, BUN 11, creatinine 1.6, glucose 107.  CPK 480, lipase 36, amylase 106, albumin 3.7, calcium 6.2, total protein 7.3, ALT 22, AST 14, alkaline phosphatase 81.  Urinalysis was negative for nitrites, negative leukocyte esterase, negative blood, 0-2 white blood cell count, 0-2 RBCs on microscopic scan.  Bland urine sediment.  X-RAY:  Chest x-ray shows a new right upper lobe airspace disease consistent with community acquired pneumonia.  ASSESSMENT/PLAN:  1. Community acquired pneumonia.  The patient was given IV Rocephin 1 gram IV     and was also given Azithromycin 500 mg IV.  Will change her to oral     Azithromycin 250 mg q.d. and continue with Rocephin at 1 gram q.d.  Blood     cultures were taken and will await sensitivities.  The patient is     currently  saturating 97% on room air and is without any tachypnea or     dyspnea on  exertion.  She continues to do well, and she may be discharged     to home on  oral antibiotics tomorrow.  2. Status post cadaveric kidney transplant.  Given the patients     immunocompromised status, will admit to the hospital for IV antibiotics.     Her renal function is stable and will admit the patient to a private room     and continue with her current medications.  3. Hypocalcemia.  Calcium low at 6.2 and this may have explained her numbness     and tingling.  The patient was given 1 amp of calcium chloride IV.  Will     recheck calcium level.  Will continue with Calcitriol therapy as well as     oral calcium carbonate.  4. Rhabdomyolysis secondary to Lipitor.  The patients CPKs have decreased     somewhat and are continuing to hold her medications.  Will continue to     follow.  5. Asthma.  The patient is currently without any evidence of bronchospasm and     will continue with her  current treatment. Dictated by:   Donetta Potts, M.D. Attending Physician:  Nat Christen DD:  10/30/01  TD:  10/31/01 Job: [id number]

## 2011-05-11 NOTE — Discharge Summary (Signed)
[name], [name]              ACCOUNT NO.:  [id number]   MEDICAL RECORD NO.:  [id number]          PATIENT TYPE:  INP   LOCATION:  [id number]                         FACILITY:  [facility]   PHYSICIAN:  Jacquelynn Cree, M.D.   DATE OF BIRTH:  09-05-1976   DATE OF ADMISSION:  06/21/2009  DATE OF DISCHARGE:  06/21/2009                               DISCHARGE SUMMARY   PRIMARY CARE PHYSICIAN:  Dr. Lottie Dawson of [place].   NEPHROLOGIST:  Dr. Erling Cruz.   DISCHARGE DIAGNOSES:  1. Symptomatic anemia of chronic disease.  2. Panic attack likely induced by #1.  3. Hypokalemia.  4. End-stage renal disease.  5. Chronic pain syndrome.  6. Chronic nausea, vomiting and diarrhea.  7. Anxiety disorder.  8. Obsessive compulsive disorder.  9. Diabetes.  10.End-stage renal disease.  11.Hypertension.  12.Gastroesophageal reflux disease.  13.History of gastroparesis.   DISCHARGE MEDICATIONS:  1. CellCept 500 mg p.o. b.i.d.  2. Prednisone 10 mg p.o. daily.  3. Sodium bicarbonate 650 mg p.o. t.i.d.  4. Prograf 3 mg p.o. b.i.d.  5. Calcitriol 1.5 mg p.o. daily.  6. Zocor 20 mg p.o. daily.  7. Zoloft 100 mg p.o. daily.  8. Norvasc 10 mg p.o. daily.  9. Actos 30 mg p.o. daily.  10.Ambien 20 mg p.o. q.h.s. p.r.n.  11.Vistaril 25 mg p.o. t.i.d.  12.Xanax 1 mg p.o. q.h.s. p.r.n. sleep.  13.Zofran 4 mg p.o. q.i.d.  14.Phenergan 50 mg p.o. q.6 h p.r.n. nausea.  15.Flexeril 5 mg p.o. q.h.s.  16.Claritin D 1 tablet q.12 h p.r.n. allergy symptoms.  17.Lyrica 25 mg p.o. daily.  18.Compazine 10 mg p.o. q.8 h p.r.n.   CONSULTATIONS:  None.   BRIEF ADMISSION HISTORY OF PRESENT ILLNESS:  The patient is a 35-year-  old female with end-stage renal disease who is status post renal  transplantation 1999 and has subsequently suffered from organ rejection  and is awaiting a second renal transplant.  She was recently  hospitalized for evaluation of chest pain from June 10, 2009 through  June 17, 2009.  The  patient was doing well postoperative and her post  discharge but then developed symptoms of dyspnea and tachycardia and  subsequently presented to the emergency department for evaluation.  At  first, the patient was diagnosed with a panic attack.  However, upon  initial evaluation, she was also noted to be anemic and subsequently  referred to the hospitalist service for management with blood  transfusion.  For the full details, please see my dictated H and P.   PROCEDURES AND DIAGNOSTIC STUDIES:  Chest x-ray on June 21, 2009 showed  stable cardiomegaly.   HOSPITAL COURSE BY PROBLEM:  1. Symptomatic anemia:  The patient's anemia was symptomatic with      dyspnea and tachycardia.  She had an admission hemoglobin of 7 and      was transfused 2 units of packed red blood cells with pre      medications consisting of Benadryl, hydrocortisone, and Tylenol.      She tolerated the transfusion well and was feeling at her normal  baseline and subsequently was discharged home after completion of      the transfusion.  2. Panic attack:  The patient does have a history of psychiatric      illness, and anxiety disorder with panic attacks.  Her attack seems      to have been precipitated by a physiologic phenomenon from anemia.      The patient has not had any subsequent problems with anxiety      throughout her blood transfusion.  3. Hypokalemia:  Was given 20 mEq of potassium repletion.  4. End-stage renal disease:  The patient's end-stage renal disease is      stable.  Her creatinine is at baseline values.  There has not been      any indication for emergent hemodialysis and she should follow up      with her nephrologist as usual.  5. Chronic pain:  The patient is on multiple psychotropic and sedating      medications including per her report morphine and oxycodone for      pain control.  She was markedly sedated on initial presentation and      was somewhat medication seeking once she fully  awakened.  Would      consider titrating her pain medications down as tolerated or      consider referral to a pain specialist.  6. Chronic nausea/vomiting/diarrhea:  The patient was not having any      active vomiting or diarrhea while in the hospital.  She was treated      with antiemetics as needed.  7. Dyslipidemia:  The patient was maintained on her usual statin      therapy.  8. Diabetes:  The patient was continued on Actos and sliding scale      insulin while in the hospital.  9. Anxiety/obsessive-compulsive disorder:  The patient was continued      on Zoloft while in the hospital.   DISPOSITION:  The patient felt well after receiving 2 units of packed  red blood cells and requested discharge home.  She, therefore, was  discharged home after the second unit of blood.   Time spent coordinating care for discharge and discharge instruction  equals 25 minutes.      Jacquelynn Cree, M.D.  Electronically Signed     CR/MEDQ  D:  06/22/2009  T:  06/22/2009  Job:  [id number]   cc:   Darrold Span. Florene Glen, M.D.  Fax: [phone]   Dr. Lottie Dawson

## 2011-05-11 NOTE — Consult Note (Signed)
NAME:  [name], [name]                        ACCOUNT NO.:  [id number]   MEDICAL RECORD NO.:  [id number]                   PATIENT TYPE:  INP   LOCATION:  6709                                 FACILITY:  [facility]   PHYSICIAN:  Earnstine Regal, M.D.                DATE OF BIRTH:  August 17, 1976   DATE OF CONSULTATION:  05/04/2004  DATE OF DISCHARGE:                                   CONSULTATION   REFERRING PHYSICIAN:  Darrold Span. Florene Glen, M.D., and Maudie Flakes. Hassell Done, M.D.   CHIEF COMPLAINT:  Cellulitis, abscess, right thigh.   HISTORY OF PRESENT ILLNESS:  The patient is a 35 year old black female  admitted directly from the office of Dr. Erling Cruz to the renal medicine  service [facility] hospital today May 12 for cellulitis and abscess on the  right medial thigh. The patient has significant past medical history  including cadaveric renal transplantation. She is on immunosuppression. The  patient notes approximately a four day illness which began with initially  blurred vision. This resolved, but the patient developed nausea and loss of  appetite. She developed fever. She presented to Dr. Florene Glen and on  examination earlier today was found to have an area in the medial right  thigh which was erythematous, tender, with developing surrounding  cellulitis. She was admitted on the renal medicine service to [facility] for initiation of intravenous antibiotics. General surgery was  consulted for incision and drainage of abscess, right thigh.   PAST MEDICAL HISTORY:  Extensive past medical history including:  1. End-stage renal disease.  2. History of cadaveric renal transplantation.  3. History of gastroesophageal reflux.  4. Status post total parathyroidectomy with autotransplantation and     resulting hypocalcemia postoperatively.  5. History of hypertension.  6. History of pilonidal cyst with excision and open packing by Dr. Sammuel Hines.     Shiela Mayer., in October 2004.   The patient is  now seen for right thigh abscess with need for incision and  drainage.   MEDICATIONS:  Please see medical record, extensive list.   ALLERGIES:  VANCOMYCIN and IVP DYE.   SOCIAL HISTORY:  The patient works for the school system. She denies tobacco  or alcohol use. She lives in [place].   REVIEW OF SYSTEMS:  A 15-system review without other significant finding  except as noted above.   FAMILY HISTORY:  Noncontributory.   PHYSICAL EXAMINATION:  VITAL SIGNS:  Temperature 101.4, blood pressure  110/70, weight 241 pounds.  HEENT:  Shows her to be normocephalic, atraumatic. Sclerae are clear.  Conjunctivae are clear. Pupils are equal and reactive. Dentition is fair.  Mucous membranes are moist.  NECK:  Shows multiple surgical wounds which are well healed. There is no  palpable mass. There is no tenderness. There are no supraclavicular masses.  LUNGS:  Clear to auscultation without rales, rhonchi, or wheeze.  CARDIAC:  Shows regular  rate and rhythm without significant murmur.  ABDOMEN:  Soft without distention. Bowel sounds are present.  EXTREMITIES:  Examination of the extremities shows an area of erythema and  induration in the proximal medial right thigh. Area of erythema extends  approximately 10 to 12 cm. Central area of induration measures approximately  5 cm in greatest dimension. It is acutely tender to palpation. It is  fluctuate. There appears to a central sinus tract. Examination of the  remaining skin of the extremities shows no similar lesions at this time.   LABORATORY DATA:  White count 13.5, hemoglobin 12.6, platelet count 348,000.  Prothrombin time 13.0, INR 1.0, PTT 36. Electrolytes are normal. Liver  function tests are normal. Calcium is low at 6.6.   IMPRESSION:  Right thigh cellulitis with abscess arising in patient with  history of renal transplantation and immunosuppression.   PLAN:  I discussed with [name] the acuity of the situation and the  need for  immediate surgical incision and drainage of the abscess cavity. We  will send cultures to the lab. The wound will require open packing for  drainage. She will be started on intravenous antibiotics per renal medicine.  Wound care will be necessary for several days following the procedure. The  patient understands and wishes to proceed.                                               Earnstine Regal, M.D.    TMG/MEDQ  D:  05/04/2004  T:  05/05/2004  Job:  [id number]   cc:   [facility]

## 2011-05-11 NOTE — Discharge Summary (Signed)
[name], [name]              ACCOUNT NO.:  [id number]   MEDICAL RECORD NO.:  [id number]          PATIENT TYPE:  INP   LOCATION:  6741                         FACILITY:  [facility]   PHYSICIAN:  Louis Meckel, M.D.DATE OF BIRTH:  1976-02-22   DATE OF ADMISSION:  12/23/2007  DATE OF DISCHARGE:  12/27/2007                               DISCHARGE SUMMARY   ADMISSION DIAGNOSES:  1. Bilateral pulmonary infiltrates with fever and cough.  2. Weakness.  3. End-stage renal disease status post cadaveric renal transplant.  4. Gastroesophageal reflux disease.  5. Type 2 diabetes mellitus, steroid-induced  6. Secondary hyperparathyroidism.  7. Hypertension.  8. 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  9. History of depression.  10.Hyperlipidemia.   DISCHARGE DIAGNOSES:  1. Pneumonia  2. Weakness, improved.  3. Status post cadaveric renal transplant with acute on chronic renal      failure, creatinine 4.6 at discharge with baseline approximately      3.0.  4. Gastroesophageal reflux disease.  5. Type 2 diabetes mellitus on increased medication.  6. Secondary hyperparathyroidism on decreased medication.  7. Hypertension.  8. Hyperlipidemia.  9. History of depression.  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  11.Anemia of chronic disease.  12.Enterococcus urinary tract infection, asymptomatic without      treatment.   HISTORY:  A 35 year old black female with end-stage renal disease,  hypertension, radical right nephrectomy secondary to renal cell  carcinoma, type 2 diabetes mellitus, steroid-induced, status post  cadaveric transplant at [facility] in 1999 with a baseline creatinine  approximately 3.0.  She presents to the [facility] Emergency Room  complaining of shortness of breath, substernal chest pain, weakness,  green sputum, chills, sporadic fevers, myalgias.  Chest x-ray in the  emergency room  was suggestive of infiltrates and she was admitted for  treatment of pneumonia.   LABORATORY DATA:  Labs on admission:  White count 17,200 with 93 segs, 6  lymphocytes, 0 monocytes, 0 eosinophils, hemoglobin 11.6, hematocrit  35.3, platelets 448,000, sodium 134, potassium 3.4, chloride 103, CO2  20, glucose 305, BUN 40, creatinine 4.63, calcium 11.0, phosphorus 5.2.  LFTs within normal limits.  Albumin 3.0, CK 35, MB 1.4, troponin 0.07.  Prograf level 13.4 on December 24, 2007.   HOSPITAL COURSE:  1. Pneumonia.  The patient was admitted.  Blood cultures were obtained      and she was placed on Zithromax and Rocephin intravenously.  Blood      cultures remained negative.  Sputum cultures were ordered, but were      unobtainable.  She remained afebrile.  Clinically, she was      improving with decreasing cough and decreasing shortness of breath.      Repeat chest x-ray done on December 26, 2007 showed improving      bilateral air space disease with no pleural fluid.  Bronchodilators      were used in nebulized form.  After 48 hours, the patient was      switched to oral  Avelox 400 mg daily.  She remained stable and was      discharged on December 27, 2007 to complete 7 more days of Avelox at      400 mg daily.  She still had a cough, but overall was back to      baseline in strength and appetite.  2. Cadaveric renal transplant.  The patient was placed on IV fluids on      admission.  Prograf level was sent and came back at 13.4.  Urine      grew enterococcus sensitive to nitrofurantoin and vancomycin.  She      was asymptomatic without dysuria, frequency, urgency or hematuria.      The urinalysis was not done, 55,000 colonies grew out and it was      decided to not treat.  Creatinine ranged between 4.55 and 4.94.  On      day of discharge, her creatinine was 4.6, BUN 41.  Urine output was      approximately 2 liters per day.  She will follow up at [facility] within the  week.  She has routine weekly labs      ordered.  Procrit was dosed at 20,000 units subcu. 1 time during      this hospitalization.  Her hemoglobin ranged between 11.0 and 12.5.  3. Secondary hyperparathyroidism.  In response to elevated calcium on      admission of 11.0 with an albumin of 3, her Rocaltrol was decreased      to 2 mcg orally daily.  At time of discharge, her calcium was 10.0      and her albumin at 3.2.  Phosphorus was 6.3.  She was discharged on      no phosphate binders and treatment will be determined with follow      up labs as an outpatient.  4. Diabetes.  Blood sugars were uncontrolled.  Glipizide was increased      to 5 mg b.i.d. from just once daily.  Blood sugars on this regimen      decreased from the 200s into the 110s.  She is instructed to      monitor blood sugars at home and bring the record to follow up      office visits.   DISCHARGE MEDICATIONS:  1. Avelox 400 mg daily for 7 more days.  2. Calcitriol 2 mcg daily.  3. Glipizide 5 mg b.i.d.  4. Neurontin 200 mg daily.  5. Reglan 10 mg t.i.d.  6. Diflucan 100 mg every 3 days x3 doses as needed for thrush or      vaginitis while on antibiotic therapy.  7. Prednisone 10 mg daily.  8. Asacol 240 mg daily.  9. CellCept 500 mg b.i.d.  10.Multivitamin with iron daily.  11.Nexium 40 mg daily.  12.Prograf 3 mg b.i.d.  13.Procrit 20,000 units subcu. weekly.  14.Zoloft 25 mg daily.  15.Zofran 4 mg b.i.d.  16.Zocor 20 mg daily.  17.Vistaril 25 mg q.h.s.  18.Sodium bicarb 650 mg 1 daily.  19.Serevent 2 puffs daily.  20.Hold Cozaar in view of elevated creatinine with follow up in the      office.      Nonah Mattes, P.A.    ______________________________  Louis Meckel, M.D.    RRK/MEDQ  D:  02/12/2008  T:  02/13/2008  Job:  [id number]

## 2011-05-11 NOTE — Op Note (Signed)
NAME:  [name], [name]                        ACCOUNT NO.:  [id number]   MEDICAL RECORD NO.:  [id number]                   PATIENT TYPE:  INP   LOCATION:  6709                                 FACILITY:  [facility]   PHYSICIAN:  Earnstine Regal, M.D.                DATE OF BIRTH:  12-Oct-1976   DATE OF PROCEDURE:  05/05/2004  DATE OF DISCHARGE:                                 OPERATIVE REPORT   PREOPERATIVE DIAGNOSIS:  Right thigh abscess with cellulitis.   POSTOPERATIVE DIAGNOSIS:  Right thigh abscess with cellulitis.   PROCEDURE:  Incision and drainage, right thigh abscess, with open packing.   SURGEON:  Earnstine Regal, M.D.   ANESTHESIA:  Local with intravenous sedation.   PREPARATION:  Betadine.   COMPLICATIONS:  None.   ESTIMATED BLOOD LOSS:  Minimal.   INDICATIONS:  The patient is a 35 year old black female who presents at the  request of Ollen Gross C. Florene Glen, M.D., with abscess on the right medial thigh  with associated cellulitis.  The patient has had a cadaveric renal  transplant and is on immunosuppression.  White blood cell count is elevated  with a left shift.  The patient is febrile.  She now comes to the operating  room for incision and drainage.   BODY OF REPORT:  The procedure is done in OR #16 at the [facility]  The patient is brought to the operating room and placed  in a supine position on the operating room table.  Following the  administration of intravenous sedation, the patient is placed in a frogleg  position.  At the right medial thigh there is an area of approximately 12 cm  of induration and cellulitis.  Approximately a 5 cm abscess is palpable in  the central portion of the cellulitis.  Skin is anesthetized with local  anesthetic.  A 3 cm incision is made with a #15 blade and carried through  the subcutaneous tissues into the abscess cavity.  The cavity measures  approximately 3 cm and  contains a small amount of fluid and debris.   Aerobic and anaerobic cultures  are taken and submitted to the laboratory.  The cavity is debrided and then  packed with half-inch iodoform gauze packing.  Dry gauze dressings are  placed.  The patient tolerated the procedure well.                                               Earnstine Regal, M.D.    TMG/MEDQ  D:  05/04/2004  T:  05/06/2004  Job:  [id number]   cc:   Darrold Span. Florene Glen, M.D.  [address]  [place]  [place] [address]  Fax: [phone]

## 2011-05-11 NOTE — Op Note (Signed)
   NAME:  [name], [name]                   ACCOUNT NO.:  [id number]   MEDICAL RECORD NO.:  [id number]                   PATIENT TYPE:  OUT   LOCATION:  [facility]                                 FACILITY:  [facility]   PHYSICIAN:  Sammuel Hines. Daiva Nakayama, M.D.              DATE OF BIRTH:  04/14/1976   DATE OF PROCEDURE:  10/22/2003  DATE OF DISCHARGE:                                 OPERATIVE REPORT   PREOPERATIVE DIAGNOSIS:  Pilonidal abscess.   POSTOPERATIVE DIAGNOSIS:  Pilonidal abscess.   PROCEDURE:  I&D of pilonidal abscess.   SURGEON:  Sammuel Hines. Marlou Starks, M.D.   ANESTHESIA:  General endotracheal.   DESCRIPTION OF PROCEDURE:  After informed consent was obtained, the patient  was brought to the operating room and left in the supine position on the  bed.  After adequate induction of general anesthesia, the patient was  flipped into a prone position, and all pressure points were padded.  The  patient's buttocks were retracted laterally, and the gluteal cleft area was  prepped with Betadine and draped in the usual sterile manner.  The area of  draining purulence was opened sharply with a 15 blade knife.  The cavity was  probed bluntly with the hemostat and finger until the base of the cavity was  identified.  Cultures were taken.  The edges of the cavity were then  fulgurated with the electrocautery until the entire area was hemostatic.  The wound was then packed with 4 x 4 gauze, and sterile dressings were  applied.  The patient tolerated the procedure well.  At the end of the case,  all needle, sponge, and instrument counts were correct.  The patient was  then awakened and taken to the recovery room in stable condition.                                               Sammuel Hines. Daiva Nakayama, M.D.    PST/MEDQ  D:  10/22/2003  T:  10/22/2003  Job:  [id number]

## 2011-05-11 NOTE — Op Note (Signed)
NAME:  [name], [name]                        ACCOUNT NO.:  [id number]   MEDICAL RECORD NO.:  [id number]                   PATIENT TYPE:  OBV   LOCATION:  [id number]                                 FACILITY:  [facility]   PHYSICIAN:  Haywood Lasso, M.D.           DATE OF BIRTH:  Oct 28, 1976   DATE OF PROCEDURE:  08/25/2004  DATE OF DISCHARGE:  08/25/2004                                 OPERATIVE REPORT   PREOPERATIVE DIAGNOSIS:  Right calf abscess.   POSTOPERATIVE DIAGNOSIS:  Right calf abscess.   OPERATION:  I&D right calf abscess.   SURGEON:  Dr. Margot Chimes   ANESTHESIA:  MAC.   CLINICAL HISTORY:  This patient is a 35 year old lady who has had multiple  leg abscesses in the past which have required operative drainage.  She  presented to the office today with another one in her right calf.  There was  an area of superficial skin necrosis and an area of erythema around it.  She  wished to have some IV sedation for the drainage.   DESCRIPTION OF PROCEDURE:  The patient seen in the holding area and had no  further questions.  She was taken to the operating room and after IV  sedation, the area was prepped.  We used Betadine.  I anesthetized with 1%  Xylocaine and after getting excellent anesthesia, I went ahead and did an  elliptical incision to encompass the open necrotic area of skin.  There was  a small abscess cavity, and cultures were taken.  A small pack was placed  and sterile dressings applied.  The patient tolerated the procedure well.                                               Haywood Lasso, M.D.    CJS/MEDQ  D:  08/25/2004  T:  08/27/2004  Job:  [id number]

## 2011-05-11 NOTE — Discharge Summary (Signed)
NAME:  [name], [name]                        ACCOUNT NO.:  [id number]   MEDICAL RECORD NO.:  [id number]                   PATIENT TYPE:  INP   LOCATION:  [id number]                                 FACILITY:  [facility]   PHYSICIAN:  Darrold Span. Florene Glen, M.D.               DATE OF BIRTH:  1976/03/16   DATE OF ADMISSION:  05/04/2004  DATE OF DISCHARGE:  05/06/2004                                 DISCHARGE SUMMARY   BRIEF HISTORY:  A 35 year old black female with end-stage renal disease,  status post renal transplant is admitted now with cellulitis of the right  thigh, felt secondary to abscess formation.  In view of her chronic  immunosuppressive state, she is being admitted for close monitoring,  surgical intervention, and IV antibiotics.   LABS ON ADMISSION:  White count 13,500, hemoglobin 12.6, hematocrit 37.6,  platelets 348,000.  Sodium 135, potassium 3.4, chloride 103, CO2 22, glucose  111, BUN 18, creatinine 1.6, calcium 6.6, albumin 3.0.  LFTs within normal  limits.  Urinalysis is negative with 7-10 white cells, 3-6 red blood cells,  moderate leukocyte esterase, negative nitrite, protein, ketone, bilirubin,  or glucose.   HOSPITAL COURSE:  The patient was admitted, placed on her usual medications.  A chest x-ray was done which was read as resolved lingula and left lower  lobe air space disease.  Urine and blood cultures were obtained, and she was  placed on Fortaz and vancomycin, in addition to her usual medications.  Dr.  Harlow Asa did an incision and drainage of the abscess of the right thigh on May 05, 2004.  Cultures were sent, and she was empirically switched from  vancomycin and Fortaz to Ancef.  The abscess measured approximately 5-cm.  At the time of discharge, wound cultures were not back, and she was sent  home on Keflex 500 mg three times a day for seven days.  Creatinine was at  baseline at 1.6.  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  Her vitamin D was increased  to 4 mcg per day for a calcium that was 6.6 with  an albumin of 3.0.   She will follow up Dr. Harlow Asa and Dr. Florene Glen in 1-2 weeks after discharge.   Home health nurse will do b.i.d. dressing changes for the next 1-2 weeks.   DISCHARGE MEDICATIONS:  1. CellCept 500 mg b.i.d.  2. Prednisone 10 mg daily.  3. Lasix 20 mg daily.  4. Rapamune 3 mg daily.  5. Rocaltrol 0.5 mcg eight pills every morning.  6. Calcium carbonate 500 mg three with meals.  7. Prevacid 30 mg daily.  8. TriCor 160 mg daily.  9. Ferrous sulfate 325 mg daily.  10.      Flovent 110 mcg two puffs at bedtime.  11.      Albuterol nebulizer q.i.d.  12.      Keflex 500 mg t.i.d. for seven days.  Nonah Mattes, P.A.                      Darrold Span. Florene Glen, M.D.    RRK/MEDQ  D:  06/23/2004  T:  06/24/2004  Job:  [id number]   cc:   Earnstine Regal, M.D.  [address]  [place] [address]  Fax: [phone]

## 2011-05-14 ENCOUNTER — Other Ambulatory Visit (HOSPITAL_BASED_OUTPATIENT_CLINIC_OR_DEPARTMENT_OTHER): Payer: Self-pay | Admitting: Internal Medicine

## 2011-05-14 LAB — GLUCOSE, CAPILLARY
Glucose-Capillary: 183 mg/dL — ABNORMAL HIGH (ref 70–99)
Glucose-Capillary: 188 mg/dL — ABNORMAL HIGH (ref 70–99)

## 2011-05-15 ENCOUNTER — Other Ambulatory Visit (HOSPITAL_BASED_OUTPATIENT_CLINIC_OR_DEPARTMENT_OTHER): Payer: Self-pay | Admitting: Internal Medicine

## 2011-05-15 LAB — GLUCOSE, CAPILLARY
Glucose-Capillary: 241 mg/dL — ABNORMAL HIGH (ref 70–99)
Glucose-Capillary: 200 mg/dL — ABNORMAL HIGH (ref 70–99)

## 2011-05-16 ENCOUNTER — Other Ambulatory Visit (HOSPITAL_BASED_OUTPATIENT_CLINIC_OR_DEPARTMENT_OTHER): Payer: Self-pay | Admitting: Internal Medicine

## 2011-05-16 LAB — GLUCOSE, CAPILLARY
Glucose-Capillary: 194 mg/dL — ABNORMAL HIGH (ref 70–99)
Glucose-Capillary: 187 mg/dL — ABNORMAL HIGH (ref 70–99)

## 2011-05-17 ENCOUNTER — Other Ambulatory Visit (HOSPITAL_BASED_OUTPATIENT_CLINIC_OR_DEPARTMENT_OTHER): Payer: Self-pay | Admitting: Internal Medicine

## 2011-05-17 LAB — GLUCOSE, CAPILLARY
Glucose-Capillary: 170 mg/dL — ABNORMAL HIGH (ref 70–99)
Glucose-Capillary: 185 mg/dL — ABNORMAL HIGH (ref 70–99)

## 2011-05-18 ENCOUNTER — Other Ambulatory Visit (HOSPITAL_BASED_OUTPATIENT_CLINIC_OR_DEPARTMENT_OTHER): Payer: Self-pay | Admitting: Internal Medicine

## 2011-05-18 LAB — GLUCOSE, CAPILLARY
Glucose-Capillary: 153 mg/dL — ABNORMAL HIGH (ref 70–99)
Glucose-Capillary: 173 mg/dL — ABNORMAL HIGH (ref 70–99)

## 2011-05-24 ENCOUNTER — Other Ambulatory Visit (HOSPITAL_BASED_OUTPATIENT_CLINIC_OR_DEPARTMENT_OTHER): Payer: Self-pay | Admitting: Internal Medicine

## 2011-05-24 LAB — GLUCOSE, CAPILLARY
Glucose-Capillary: 226 mg/dL — ABNORMAL HIGH (ref 70–99)
Glucose-Capillary: 174 mg/dL — ABNORMAL HIGH (ref 70–99)

## 2011-05-25 ENCOUNTER — Encounter (HOSPITAL_BASED_OUTPATIENT_CLINIC_OR_DEPARTMENT_OTHER): Payer: Medicare Other | Attending: Internal Medicine

## 2011-05-25 DIAGNOSIS — M8668 Other chronic osteomyelitis, other site: Secondary | ICD-10-CM | POA: Insufficient documentation

## 2011-05-25 DIAGNOSIS — T25039A Burn of unspecified degree of unspecified toe(s) (nail), initial encounter: Secondary | ICD-10-CM | POA: Insufficient documentation

## 2011-05-25 DIAGNOSIS — E119 Type 2 diabetes mellitus without complications: Secondary | ICD-10-CM | POA: Insufficient documentation

## 2011-05-25 DIAGNOSIS — Z940 Kidney transplant status: Secondary | ICD-10-CM | POA: Insufficient documentation

## 2011-05-25 DIAGNOSIS — IMO0002 Reserved for concepts with insufficient information to code with codable children: Secondary | ICD-10-CM | POA: Insufficient documentation

## 2011-05-25 DIAGNOSIS — N186 End stage renal disease: Secondary | ICD-10-CM | POA: Insufficient documentation

## 2011-05-28 ENCOUNTER — Other Ambulatory Visit (HOSPITAL_BASED_OUTPATIENT_CLINIC_OR_DEPARTMENT_OTHER): Payer: Self-pay | Admitting: Internal Medicine

## 2011-05-28 LAB — GLUCOSE, CAPILLARY
Glucose-Capillary: 208 mg/dL — ABNORMAL HIGH (ref 70–99)
Glucose-Capillary: 249 mg/dL — ABNORMAL HIGH (ref 70–99)

## 2011-05-29 ENCOUNTER — Other Ambulatory Visit (HOSPITAL_BASED_OUTPATIENT_CLINIC_OR_DEPARTMENT_OTHER): Payer: Self-pay | Admitting: Internal Medicine

## 2011-05-29 LAB — GLUCOSE, CAPILLARY
Glucose-Capillary: 235 mg/dL — ABNORMAL HIGH (ref 70–99)
Glucose-Capillary: 233 mg/dL — ABNORMAL HIGH (ref 70–99)

## 2011-05-30 ENCOUNTER — Other Ambulatory Visit (HOSPITAL_BASED_OUTPATIENT_CLINIC_OR_DEPARTMENT_OTHER): Payer: Self-pay | Admitting: Internal Medicine

## 2011-05-30 LAB — GLUCOSE, CAPILLARY
Glucose-Capillary: 163 mg/dL — ABNORMAL HIGH (ref 70–99)
Glucose-Capillary: 204 mg/dL — ABNORMAL HIGH (ref 70–99)

## 2011-05-31 ENCOUNTER — Other Ambulatory Visit (HOSPITAL_BASED_OUTPATIENT_CLINIC_OR_DEPARTMENT_OTHER): Payer: Self-pay | Admitting: Internal Medicine

## 2011-05-31 LAB — GLUCOSE, CAPILLARY
Glucose-Capillary: 165 mg/dL — ABNORMAL HIGH (ref 70–99)
Glucose-Capillary: 160 mg/dL — ABNORMAL HIGH (ref 70–99)

## 2011-06-01 ENCOUNTER — Other Ambulatory Visit (HOSPITAL_BASED_OUTPATIENT_CLINIC_OR_DEPARTMENT_OTHER): Payer: Self-pay | Admitting: Internal Medicine

## 2011-06-01 LAB — GLUCOSE, CAPILLARY
Glucose-Capillary: 209 mg/dL — ABNORMAL HIGH (ref 70–99)
Glucose-Capillary: 271 mg/dL — ABNORMAL HIGH (ref 70–99)

## 2011-06-28 ENCOUNTER — Encounter (HOSPITAL_BASED_OUTPATIENT_CLINIC_OR_DEPARTMENT_OTHER): Payer: Medicare Other | Attending: Internal Medicine

## 2011-06-28 DIAGNOSIS — E119 Type 2 diabetes mellitus without complications: Secondary | ICD-10-CM | POA: Insufficient documentation

## 2011-06-28 DIAGNOSIS — Z940 Kidney transplant status: Secondary | ICD-10-CM | POA: Insufficient documentation

## 2011-06-28 DIAGNOSIS — N186 End stage renal disease: Secondary | ICD-10-CM | POA: Insufficient documentation

## 2011-06-28 DIAGNOSIS — IMO0002 Reserved for concepts with insufficient information to code with codable children: Secondary | ICD-10-CM | POA: Insufficient documentation

## 2011-06-28 DIAGNOSIS — T25039A Burn of unspecified degree of unspecified toe(s) (nail), initial encounter: Secondary | ICD-10-CM | POA: Insufficient documentation

## 2011-07-05 NOTE — Assessment & Plan Note (Signed)
[place]                                ON-CALL NOTE  LATOYYA, SHAIN                       MRN:          [id number] DATE:06/08/2011                            DOB:          01/07/1976   TELEPHONE NOTE  [name] called tonight stating she has had rectal bleeding. She has a long history of hemorrhoids, has had more hemorrhoid bleeding.  I instructed her to obtain a hemorrhoid suppository and to use warm soaks.  She will contact Dr. Ulyses Amor office on Monday.    Sandy Salaam. Deatra Ina, MD,FACG    RDK/MedQ  DD: 06/08/2011  DT: 06/09/2011  Job #: [id number]

## 2011-07-13 ENCOUNTER — Ambulatory Visit (HOSPITAL_COMMUNITY)
Admission: RE | Admit: 2011-07-13 | Discharge: 2011-07-13 | Disposition: A | Discharge status: Home / Self Care | Payer: Medicare Other | Source: Ambulatory Visit | Attending: Gastroenterology | Admitting: Gastroenterology

## 2011-07-13 ENCOUNTER — Other Ambulatory Visit: Payer: Self-pay | Admitting: Gastroenterology

## 2011-07-13 DIAGNOSIS — Z1211 Encounter for screening for malignant neoplasm of colon: Secondary | ICD-10-CM | POA: Insufficient documentation

## 2011-07-13 DIAGNOSIS — K648 Other hemorrhoids: Secondary | ICD-10-CM | POA: Insufficient documentation

## 2011-07-13 DIAGNOSIS — K5730 Diverticulosis of large intestine without perforation or abscess without bleeding: Secondary | ICD-10-CM | POA: Insufficient documentation

## 2011-07-13 DIAGNOSIS — K644 Residual hemorrhoidal skin tags: Secondary | ICD-10-CM | POA: Insufficient documentation

## 2011-07-13 DIAGNOSIS — D126 Benign neoplasm of colon, unspecified: Secondary | ICD-10-CM | POA: Insufficient documentation

## 2011-08-04 ENCOUNTER — Other Ambulatory Visit: Payer: Self-pay

## 2011-08-04 ENCOUNTER — Emergency Department (HOSPITAL_COMMUNITY): Payer: Medicare Other

## 2011-08-04 ENCOUNTER — Encounter: Payer: Self-pay | Admitting: *Deleted

## 2011-08-04 ENCOUNTER — Emergency Department (HOSPITAL_COMMUNITY)
Admission: EM | Admit: 2011-08-04 | Discharge: 2011-08-04 | Discharge status: Against Medical Advice | Payer: Medicare Other | Attending: Emergency Medicine | Admitting: Emergency Medicine

## 2011-08-04 DIAGNOSIS — R0789 Other chest pain: Secondary | ICD-10-CM | POA: Insufficient documentation

## 2011-08-04 DIAGNOSIS — R1013 Epigastric pain: Secondary | ICD-10-CM | POA: Insufficient documentation

## 2011-08-04 HISTORY — DX: Disorder of kidney and ureter, unspecified: N28.9

## 2011-08-04 LAB — CBC
HCT: 37.8 % (ref 36.0–46.0)
Hemoglobin: 12.2 g/dL (ref 12.0–15.0)
MCH: 26.9 pg (ref 26.0–34.0)
MCHC: 32.3 g/dL (ref 30.0–36.0)
MCV: 83.4 fL (ref 78.0–100.0)
Platelets: 289 10*3/uL (ref 150–400)
RBC: 4.53 MIL/uL (ref 3.87–5.11)
RDW: 14.1 % (ref 11.5–15.5)
WBC: 11.6 10*3/uL — ABNORMAL HIGH (ref 4.0–10.5)

## 2011-08-04 LAB — BASIC METABOLIC PANEL
BUN: 19 mg/dL (ref 6–23)
CO2: 16 mEq/L — ABNORMAL LOW (ref 19–32)
Calcium: 9.6 mg/dL (ref 8.4–10.5)
Chloride: 102 mEq/L (ref 96–112)
Creatinine, Ser: 1.48 mg/dL — ABNORMAL HIGH (ref 0.50–1.10)
GFR calc Af Amer: 48 mL/min — ABNORMAL LOW (ref >60)
GFR calc non Af Amer: 40 mL/min — ABNORMAL LOW (ref >60)
Glucose, Bld: 368 mg/dL — ABNORMAL HIGH (ref 70–99)
Potassium: 4 mEq/L (ref 3.5–5.1)
Sodium: 136 mEq/L (ref 135–145)

## 2011-08-04 LAB — CARDIAC PANEL(CRET KIN+CKTOT+MB+TROPI)
CK, MB: 2.2 ng/mL (ref 0.3–4.0)
Relative Index: INVALID (ref 0.0–2.5)
Total CK: 71 U/L (ref 7–177)
Troponin I: <0.3 ng/mL (ref <0.30)

## 2011-08-04 NOTE — ED Notes (Signed)
Patient left without signing ama papers. Stated she was leaving and was not signing papers that she could not see.

## 2011-08-04 NOTE — Progress Notes (Signed)
Date: 08/04/2011 0048  Rate: 89  Rhythm: normal sinus rhythm  QRS Axis: normal  Intervals: normal  ST/T Wave abnormalities: normal  Conduction Disutrbances:none  Narrative Interpretation:   Old EKG Reviewed: none available

## 2011-08-28 ENCOUNTER — Other Ambulatory Visit: Payer: Self-pay | Admitting: Gynecology

## 2011-09-13 LAB — RENAL FUNCTION PANEL
Albumin: 3 — ABNORMAL LOW
Albumin: 3.2 — ABNORMAL LOW
Albumin: 3.4 — ABNORMAL LOW
BUN: 40 — ABNORMAL HIGH
BUN: 41 — ABNORMAL HIGH
BUN: 41 — ABNORMAL HIGH
CO2: 19
CO2: 19
CO2: 22
Calcium: 10
Calcium: 10.3
Calcium: 9.7
Chloride: 106
Chloride: 108
Chloride: 109
Creatinine, Ser: 4.55 — ABNORMAL HIGH
Creatinine, Ser: 4.6 — ABNORMAL HIGH
Creatinine, Ser: 4.94 — ABNORMAL HIGH
GFR calc Af Amer: 12 — ABNORMAL LOW
GFR calc Af Amer: 13 — ABNORMAL LOW
GFR calc Af Amer: 13 — ABNORMAL LOW
GFR calc non Af Amer: 10 — ABNORMAL LOW
GFR calc non Af Amer: 11 — ABNORMAL LOW
GFR calc non Af Amer: 11 — ABNORMAL LOW
Glucose, Bld: 143 — ABNORMAL HIGH
Glucose, Bld: 154 — ABNORMAL HIGH
Glucose, Bld: 224 — ABNORMAL HIGH
Phosphorus: 6.3 — ABNORMAL HIGH
Phosphorus: 6.3 — ABNORMAL HIGH
Phosphorus: 6.3 — ABNORMAL HIGH
Potassium: 3.4 — ABNORMAL LOW
Potassium: 3.7
Potassium: 3.8
Sodium: 140
Sodium: 140
Sodium: 141

## 2011-09-13 LAB — CBC
HCT: 34 — ABNORMAL LOW
HCT: 36.5
Hemoglobin: 11 — ABNORMAL LOW
Hemoglobin: 12.1
MCHC: 32.4
MCHC: 33.3
MCV: 83.1
MCV: 84.3
Platelets: 391
Platelets: 408 — ABNORMAL HIGH
RBC: 4.03
RBC: 4.39
RDW: 14.8
RDW: 14.9
WBC: 13 — ABNORMAL HIGH
WBC: 13.3 — ABNORMAL HIGH

## 2011-09-14 LAB — COMPREHENSIVE METABOLIC PANEL
ALT: 11
AST: 12
Albumin: 3.5
Alkaline Phosphatase: 48
BUN: 48 — ABNORMAL HIGH
CO2: 25
Calcium: 13.8
Chloride: 100
Creatinine, Ser: 3.79 — ABNORMAL HIGH
GFR calc Af Amer: 17 — ABNORMAL LOW
GFR calc non Af Amer: 14 — ABNORMAL LOW
Glucose, Bld: 86
Potassium: 3.1 — ABNORMAL LOW
Sodium: 135
Total Bilirubin: 0.5
Total Protein: 6.9

## 2011-09-14 LAB — POCT CARDIAC MARKERS
CKMB, poc: 1.7
Myoglobin, poc: 328
Operator id: 146091
Troponin i, poc: <0.05

## 2011-09-14 LAB — DIFFERENTIAL
Basophils Absolute: 0.1
Basophils Relative: 1
Eosinophils Absolute: 0.1
Eosinophils Relative: 1
Lymphocytes Relative: 7 — ABNORMAL LOW
Lymphs Abs: 0.8
Monocytes Absolute: 0.5
Monocytes Relative: 4
Neutro Abs: 10.4 — ABNORMAL HIGH
Neutrophils Relative %: 88 — ABNORMAL HIGH

## 2011-09-14 LAB — CBC
HCT: 35.5 — ABNORMAL LOW
Hemoglobin: 11.5 — ABNORMAL LOW
MCHC: 32.5
MCV: 81.9
Platelets: 332
RBC: 4.34
RDW: 14.3
WBC: 11.9 — ABNORMAL HIGH

## 2011-09-14 LAB — MAGNESIUM: Magnesium: 1.7

## 2011-09-14 LAB — PHOSPHORUS: Phosphorus: 6.2 — ABNORMAL HIGH

## 2011-09-20 ENCOUNTER — Emergency Department (HOSPITAL_COMMUNITY): Payer: BC Managed Care – PPO

## 2011-09-20 ENCOUNTER — Other Ambulatory Visit: Payer: Self-pay

## 2011-09-20 ENCOUNTER — Emergency Department (HOSPITAL_COMMUNITY)
Admission: EM | Admit: 2011-09-20 | Discharge: 2011-09-20 | Disposition: A | Discharge status: Home / Self Care | Payer: BC Managed Care – PPO | Attending: Emergency Medicine | Admitting: Emergency Medicine

## 2011-09-20 ENCOUNTER — Encounter (HOSPITAL_COMMUNITY): Payer: Self-pay | Admitting: *Deleted

## 2011-09-20 DIAGNOSIS — M7918 Myalgia, other site: Secondary | ICD-10-CM

## 2011-09-20 DIAGNOSIS — M542 Cervicalgia: Secondary | ICD-10-CM | POA: Insufficient documentation

## 2011-09-20 DIAGNOSIS — M25519 Pain in unspecified shoulder: Secondary | ICD-10-CM | POA: Insufficient documentation

## 2011-09-20 DIAGNOSIS — R079 Chest pain, unspecified: Secondary | ICD-10-CM | POA: Insufficient documentation

## 2011-09-20 LAB — CBC
HCT: 36.1 % (ref 36.0–46.0)
Hemoglobin: 11.5 g/dL — ABNORMAL LOW (ref 12.0–15.0)
MCH: 26.5 pg (ref 26.0–34.0)
MCHC: 31.9 g/dL (ref 30.0–36.0)
MCV: 83.2 fL (ref 78.0–100.0)
Platelets: 282 K/uL (ref 150–400)
RBC: 4.34 MIL/uL (ref 3.87–5.11)
RDW: 14.9 % (ref 11.5–15.5)
WBC: 15 K/uL — ABNORMAL HIGH (ref 4.0–10.5)

## 2011-09-20 LAB — BASIC METABOLIC PANEL
BUN: 19 mg/dL (ref 6–23)
CO2: 21 mEq/L (ref 19–32)
Calcium: 7.7 mg/dL — ABNORMAL LOW (ref 8.4–10.5)
Chloride: 101 mEq/L (ref 96–112)
Creatinine, Ser: 1.49 mg/dL — ABNORMAL HIGH (ref 0.50–1.10)
GFR calc Af Amer: 48 mL/min — ABNORMAL LOW (ref >60)
GFR calc non Af Amer: 39 mL/min — ABNORMAL LOW (ref >60)
Glucose, Bld: 108 mg/dL — ABNORMAL HIGH (ref 70–99)
Potassium: 3.5 mEq/L (ref 3.5–5.1)
Sodium: 135 mEq/L (ref 135–145)

## 2011-09-20 LAB — CARDIAC PANEL(CRET KIN+CKTOT+MB+TROPI)
CK, MB: 1.6 ng/mL (ref 0.3–4.0)
Relative Index: INVALID (ref 0.0–2.5)
Total CK: 67 U/L (ref 7–177)
Troponin I: <0.3 ng/mL (ref <0.30)

## 2011-09-20 NOTE — ED Notes (Signed)
Chest pain for 3 days.

## 2011-09-21 LAB — CROSSMATCH
ABO/RH(D): O POS
ABO/RH(D): O POS
Antibody Screen: NEGATIVE
Antibody Screen: NEGATIVE

## 2011-09-21 LAB — CBC
HCT: 20.5 — ABNORMAL LOW
HCT: 19.8 — ABNORMAL LOW
HCT: 26.2 — ABNORMAL LOW
HCT: 27.1 — ABNORMAL LOW
HCT: 27.6 — ABNORMAL LOW
Hemoglobin: 6.7 — CL
Hemoglobin: 6.4 — CL
Hemoglobin: 8.7 — ABNORMAL LOW
Hemoglobin: 9 — ABNORMAL LOW
Hemoglobin: 9.1 — ABNORMAL LOW
MCHC: 32.7
MCHC: 32.5
MCHC: 32.7
MCHC: 33
MCHC: 33.5
MCV: 85.2
MCV: 85.3
MCV: 85.7
MCV: 87
MCV: 87
Platelets: 352
Platelets: 284
Platelets: 325
Platelets: 330
Platelets: 369
RBC: 2.41 — ABNORMAL LOW
RBC: 2.33 — ABNORMAL LOW
RBC: 3.01 — ABNORMAL LOW
RBC: 3.17 — ABNORMAL LOW
RBC: 3.17 — ABNORMAL LOW
RDW: 18.1 — ABNORMAL HIGH
RDW: 16.1 — ABNORMAL HIGH
RDW: 16.1 — ABNORMAL HIGH
RDW: 16.3 — ABNORMAL HIGH
RDW: 18.3 — ABNORMAL HIGH
WBC: 9.7
WBC: 12.5 — ABNORMAL HIGH
WBC: 8.8
WBC: 9.5
WBC: 9.7

## 2011-09-21 LAB — HEPATIC FUNCTION PANEL
ALT: <8
AST: 10
Albumin: 3.4 — ABNORMAL LOW
Alkaline Phosphatase: 31 — ABNORMAL LOW
Bilirubin, Direct: <0.1
Total Bilirubin: 0.3
Total Protein: 6.1

## 2011-09-21 LAB — RENAL FUNCTION PANEL
Albumin: 2.9 — ABNORMAL LOW
Albumin: 3 — ABNORMAL LOW
Albumin: 3.1 — ABNORMAL LOW
BUN: 39 — ABNORMAL HIGH
BUN: 39 — ABNORMAL HIGH
BUN: 41 — ABNORMAL HIGH
CO2: 22
CO2: 23
CO2: 23
Calcium: 10.6 — ABNORMAL HIGH
Calcium: 10.6 — ABNORMAL HIGH
Calcium: 10.8 — ABNORMAL HIGH
Chloride: 104
Chloride: 107
Chloride: 107
Creatinine, Ser: 4.55 — ABNORMAL HIGH
Creatinine, Ser: 4.55 — ABNORMAL HIGH
Creatinine, Ser: 4.7 — ABNORMAL HIGH
GFR calc Af Amer: 13 — ABNORMAL LOW
GFR calc Af Amer: 13 — ABNORMAL LOW
GFR calc Af Amer: 13 — ABNORMAL LOW
GFR calc non Af Amer: 11 — ABNORMAL LOW
GFR calc non Af Amer: 11 — ABNORMAL LOW
GFR calc non Af Amer: 11 — ABNORMAL LOW
Glucose, Bld: 106 — ABNORMAL HIGH
Glucose, Bld: 182 — ABNORMAL HIGH
Glucose, Bld: 217 — ABNORMAL HIGH
Phosphorus: 4.9 — ABNORMAL HIGH
Phosphorus: 5.1 — ABNORMAL HIGH
Phosphorus: 5.2 — ABNORMAL HIGH
Potassium: 3.5
Potassium: 3.8
Potassium: 4.2
Sodium: 135
Sodium: 138
Sodium: 139

## 2011-09-21 LAB — BASIC METABOLIC PANEL
BUN: 42 — ABNORMAL HIGH
CO2: 26
Calcium: 11.5 — ABNORMAL HIGH
Chloride: 104
Creatinine, Ser: 5.05 — ABNORMAL HIGH
GFR calc Af Amer: 12 — ABNORMAL LOW
GFR calc non Af Amer: 10 — ABNORMAL LOW
Glucose, Bld: 102 — ABNORMAL HIGH
Potassium: 3.8
Sodium: 137

## 2011-09-21 LAB — COMPREHENSIVE METABOLIC PANEL
ALT: 8
AST: 7
Albumin: 3.5
Alkaline Phosphatase: 43
BUN: 44 — ABNORMAL HIGH
CO2: 21
Calcium: 9.7
Chloride: 101
Creatinine, Ser: 4.36 — ABNORMAL HIGH
GFR calc Af Amer: 14 — ABNORMAL LOW
GFR calc non Af Amer: 12 — ABNORMAL LOW
Glucose, Bld: 120 — ABNORMAL HIGH
Potassium: 3.5
Sodium: 134 — ABNORMAL LOW
Total Bilirubin: 0.4
Total Protein: 6.5

## 2011-09-21 LAB — FOLATE RBC: RBC Folate: 660 — ABNORMAL HIGH

## 2011-09-21 LAB — DIFFERENTIAL
Basophils Absolute: 0
Basophils Absolute: 0.1
Basophils Relative: 0
Basophils Relative: 1
Eosinophils Absolute: 0
Eosinophils Absolute: 0.1
Eosinophils Relative: 0
Eosinophils Relative: 0
Lymphocytes Relative: 12
Lymphocytes Relative: 13
Lymphs Abs: 1.2
Lymphs Abs: 1.6
Monocytes Absolute: 0.5
Monocytes Absolute: 0.7
Monocytes Relative: 5
Monocytes Relative: 5
Neutro Abs: 8 — ABNORMAL HIGH
Neutro Abs: 10.2 — ABNORMAL HIGH
Neutrophils Relative %: 83 — ABNORMAL HIGH
Neutrophils Relative %: 81 — ABNORMAL HIGH

## 2011-09-21 LAB — LIPASE, BLOOD: Lipase: 33

## 2011-09-21 LAB — IRON AND TIBC
Iron: 59
Saturation Ratios: 29
TIBC: 204 — ABNORMAL LOW
UIBC: 145

## 2011-09-21 LAB — CREATININE, URINE, RANDOM: Creatinine, Urine: 68.2

## 2011-09-21 LAB — SODIUM, URINE, RANDOM: Sodium, Ur: 89

## 2011-09-21 LAB — VITAMIN B12: Vitamin B-12: 203 — ABNORMAL LOW (ref 211–911)

## 2011-09-21 LAB — PTH-RELATED PEPTIDE: PTH-related peptide: <0.2 (ref <1.4)

## 2011-09-21 LAB — ABO/RH: ABO/RH(D): O POS

## 2011-09-21 LAB — FERRITIN: Ferritin: 394 — ABNORMAL HIGH (ref 10–291)

## 2011-09-24 LAB — POCT I-STAT 4, (NA,K, GLUC, HGB,HCT)
Glucose, Bld: 112 — ABNORMAL HIGH
HCT: 27 — ABNORMAL LOW
Hemoglobin: 9.2 — ABNORMAL LOW
Potassium: 4.1
Sodium: 130 — ABNORMAL LOW

## 2011-09-24 LAB — GLUCOSE, CAPILLARY
Glucose-Capillary: 160 — ABNORMAL HIGH
Glucose-Capillary: 169 — ABNORMAL HIGH
Glucose-Capillary: 180 — ABNORMAL HIGH
Glucose-Capillary: 226 — ABNORMAL HIGH

## 2011-09-24 NOTE — ED Provider Notes (Signed)
History   35yf with pain in L neck, L shoulder and L anterior chest for past 3d. GRadual onset. Denies trauma. No fever or chills. No rash. No SOB. Denies recent unusual strenuous activity. No numbness, tingling or loss of strength. No visual complaints.   CSN: [id number] Arrival date & time: 09/20/2011  1:05 AM  Chief Complaint  Patient presents with  . Chest Pain    (Consider location/radiation/quality/duration/timing/severity/associated sxs/prior treatment) HPI  Past Medical History  Diagnosis Date  . Renal disorder   . Asthma     Past Surgical History  Procedure Date  . Nephrectomy transplanted organ     No family history on file.  History  Substance Use Topics  . Smoking status: Never Smoker   . Smokeless tobacco: Not on file  . Alcohol Use: No    OB History    Grav Para Term Preterm Abortions TAB SAB Ect Mult Living                  Review of Systems  Constitutional: Negative.   HENT: Positive for neck pain. Negative for hearing loss, ear pain, facial swelling, neck stiffness, tinnitus and ear discharge.   Respiratory: Negative.   Cardiovascular: Positive for chest pain. Negative for palpitations and leg swelling.  Gastrointestinal: Negative.   Genitourinary: Negative.   Musculoskeletal: Negative for back pain, joint swelling and gait problem.  Skin: Negative.   Neurological: Negative.   Hematological: Negative.   Psychiatric/Behavioral: Negative.   All other systems reviewed and are negative.    Allergies  Dilaudid and Vancomycin  Home Medications   Current Outpatient Rx  Name Route Sig Dispense Refill  . ALPRAZOLAM 1 MG PO TABS Oral Take 1 mg by mouth 3 (three) times daily as needed.      Marland Kitchen AMLODIPINE BESYLATE 5 MG PO TABS Oral Take 5 mg by mouth daily.      . ARIPIPRAZOLE 5 MG PO TABS Oral Take 5 mg by mouth daily.      Marland Kitchen MYCOPHENOLATE SODIUM 180 MG PO TBEC Oral Take by mouth 2 (two) times daily.      Marland Kitchen OMEPRAZOLE 20 MG PO CPDR  20 mg. 2 po  qday     . OXYCODONE HCL 5 MG PO TABS  5 mg. 2 tablets (10 mg) po tid for chronic knee and back pain     . PROMETHAZINE HCL 25 MG PO TABS Oral Take 25 mg by mouth.      . SERTRALINE HCL 100 MG PO TABS  100 mg. 2 poqhs     . SIMVASTATIN 20 MG PO TABS Oral Take 20 mg by mouth at bedtime.      Marland Kitchen SITAGLIPTIN PHOSPHATE 50 MG PO TABS Oral Take 50 mg by mouth daily.      Marland Kitchen TACROLIMUS 1 MG PO CAPS  1 mg. 3 mg poqam 4 mg poqpm     . ZOLPIDEM TARTRATE 10 MG PO TABS Oral Take 10 mg by mouth at bedtime as needed.        BP 127/81  Pulse 87  Temp(Src) 98.4 F (36.9 C) (Oral)  Resp 20  Ht 5\' 3"  (1.6 m)  Wt 238 lb (107.956 kg)  BMI 42.16 kg/m2  SpO2 98%  LMP 09/04/2011  Physical Exam  Nursing note and vitals reviewed. Constitutional: She is oriented to person, place, and time. She appears well-developed and well-nourished.  HENT:  Head: Normocephalic and atraumatic.  Right Ear: External ear normal.  Left Ear: External  ear normal.  Eyes: Conjunctivae and EOM are normal. Pupils are equal, round, and reactive to light.  Neck: Normal range of motion. Neck supple. No tracheal deviation present. No thyromegaly present.       Mild L lateral neck tenderness. No mass. No skin changes. No crepitus. FROM.  Cardiovascular: Normal rate and regular rhythm.   Pulmonary/Chest: Effort normal and breath sounds normal. She exhibits no tenderness.  Abdominal: Soft. She exhibits no distension. There is no tenderness.  Musculoskeletal: Normal range of motion. She exhibits no edema.       Tenderness along L trapezius. Neurovascularly intact distally.  Neurological: She is alert and oriented to person, place, and time. She exhibits normal muscle tone.  Skin: Skin is warm and dry. No rash noted.  Psychiatric: She has a normal mood and affect.    ED Course  Procedures (including critical care time)  Labs Reviewed  CBC - Abnormal; Notable for the following:    WBC 15.0 (*)    Hemoglobin 11.5 (*)    All other  components within normal limits  BASIC METABOLIC PANEL - Abnormal; Notable for the following:    Glucose, Bld 108 (*)    Creatinine, Ser 1.49 (*)    Calcium 7.7 (*)    GFR calc non Af Amer 39 (*)    GFR calc Af Amer 48 (*)    All other components within normal limits  CARDIAC PANEL(CRET KIN+CKTOT+MB+TROPI)  LAB REPORT - SCANNED   No results found.   1. Neck pain   2. Muscle ache of extremity       MDM  35yF with neck, LUE pain, L upper anterior CP. Suspect muscle sprain/strain given location and reproducibility with palpation and ROM.  Atypical CP given constant duration for 3d. ekg nondiagnostic and cardiac enzymes wnl x1. Cr near baseline. Unclear etiology of leukocytosis. Afebrile, nontoxic and nothing on exam or hx to suggest infectious etiology. May be stress demargination. Plan symptomatic tx and outpt fu.        Virgel Manifold, MD 09/24/11 [phone]

## 2011-09-25 LAB — COMPREHENSIVE METABOLIC PANEL
ALT: 8
AST: 10
Albumin: 3.3 — ABNORMAL LOW
Alkaline Phosphatase: 39
BUN: 39 — ABNORMAL HIGH
CO2: 20
Calcium: 9.7
Chloride: 103
Creatinine, Ser: 4.56 — ABNORMAL HIGH
GFR calc Af Amer: 13 — ABNORMAL LOW
GFR calc non Af Amer: 11 — ABNORMAL LOW
Glucose, Bld: 157 — ABNORMAL HIGH
Potassium: 3.9
Sodium: 136
Total Bilirubin: 0.4
Total Protein: 6.1

## 2011-09-25 LAB — CROSSMATCH
ABO/RH(D): O POS
Antibody Screen: NEGATIVE

## 2011-09-25 LAB — URINE CULTURE: Colony Count: 60000

## 2011-09-25 LAB — COMPREHENSIVE METABOLIC PANEL WITH GFR
ALT: <8
AST: 12
Albumin: 3.7
Alkaline Phosphatase: 41
BUN: 44 — ABNORMAL HIGH
CO2: 18 — ABNORMAL LOW
Calcium: 11 — ABNORMAL HIGH
Chloride: 106
Creatinine, Ser: 4.93 — ABNORMAL HIGH
GFR calc non Af Amer: 10 — ABNORMAL LOW
Glucose, Bld: 135 — ABNORMAL HIGH
Potassium: 4.3
Sodium: 136
Total Bilirubin: 0.6
Total Protein: 6.6

## 2011-09-25 LAB — URINALYSIS, ROUTINE W REFLEX MICROSCOPIC
Bilirubin Urine: NEGATIVE
Glucose, UA: 250 — AB
Ketones, ur: NEGATIVE
Leukocytes, UA: NEGATIVE
Nitrite: NEGATIVE
Protein, ur: 100 — AB
Specific Gravity, Urine: 1.015
Urobilinogen, UA: 0.2
pH: 6

## 2011-09-25 LAB — CBC
HCT: 23 — ABNORMAL LOW
HCT: 23.7 — ABNORMAL LOW
HCT: 25.4 — ABNORMAL LOW
HCT: 27.6 — ABNORMAL LOW
Hemoglobin: 7.5 — CL
Hemoglobin: 7.7 — CL
Hemoglobin: 8.3 — ABNORMAL LOW
Hemoglobin: 9.1 — ABNORMAL LOW
MCHC: 32.6
MCHC: 32.7
MCHC: 32.8
MCHC: 32.8
MCV: 83.9
MCV: 85
MCV: 85
MCV: 85.1
Platelets: 268
Platelets: 272
Platelets: 292
Platelets: 293
RBC: 2.74 — ABNORMAL LOW
RBC: 2.79 — ABNORMAL LOW
RBC: 2.98 — ABNORMAL LOW
RBC: 3.25 — ABNORMAL LOW
RDW: 14
RDW: 14.1
RDW: 14.2
RDW: 14.2
WBC: 11.4 — ABNORMAL HIGH
WBC: 11.4 — ABNORMAL HIGH
WBC: 11.4 — ABNORMAL HIGH
WBC: 12.1 — ABNORMAL HIGH

## 2011-09-25 LAB — RETICULOCYTES
RBC.: 2.84 — ABNORMAL LOW
Retic Count, Absolute: 34.1
Retic Ct Pct: 1.2

## 2011-09-25 LAB — HEMOGLOBIN A1C
Hgb A1c MFr Bld: 7.3 — ABNORMAL HIGH
Mean Plasma Glucose: 163

## 2011-09-25 LAB — DIFFERENTIAL
Basophils Absolute: 0
Basophils Relative: 0
Eosinophils Absolute: 0
Eosinophils Relative: 0
Lymphocytes Relative: 8 — ABNORMAL LOW
Lymphs Abs: 0.9
Monocytes Absolute: 0.2
Monocytes Relative: 2 — ABNORMAL LOW
Neutro Abs: 10.3 — ABNORMAL HIGH
Neutrophils Relative %: 90 — ABNORMAL HIGH

## 2011-09-25 LAB — GLUCOSE, CAPILLARY
Glucose-Capillary: 100 — ABNORMAL HIGH
Glucose-Capillary: 117 — ABNORMAL HIGH
Glucose-Capillary: 120 — ABNORMAL HIGH
Glucose-Capillary: 139 — ABNORMAL HIGH
Glucose-Capillary: 146 — ABNORMAL HIGH
Glucose-Capillary: 179 — ABNORMAL HIGH
Glucose-Capillary: 181 — ABNORMAL HIGH
Glucose-Capillary: 184 — ABNORMAL HIGH
Glucose-Capillary: 194 — ABNORMAL HIGH
Glucose-Capillary: 238 — ABNORMAL HIGH
Glucose-Capillary: 242 — ABNORMAL HIGH
Glucose-Capillary: 84

## 2011-09-25 LAB — BASIC METABOLIC PANEL WITH GFR
BUN: 39 — ABNORMAL HIGH
CO2: 25
Calcium: 8.6
Chloride: 102
Creatinine, Ser: 4.38 — ABNORMAL HIGH
GFR calc non Af Amer: 12 — ABNORMAL LOW
Glucose, Bld: 107 — ABNORMAL HIGH
Potassium: 3.3 — ABNORMAL LOW
Sodium: 137

## 2011-09-25 LAB — LIPID PANEL
Cholesterol: 219 — ABNORMAL HIGH
HDL: 48
LDL Cholesterol: 147 — ABNORMAL HIGH
Total CHOL/HDL Ratio: 4.6
Triglycerides: 121
VLDL: 24

## 2011-09-25 LAB — RENAL FUNCTION PANEL
Albumin: 3.6
BUN: 36 — ABNORMAL HIGH
CO2: 24
Calcium: 9.6
Chloride: 102
Creatinine, Ser: 4.28 — ABNORMAL HIGH
GFR calc non Af Amer: 12 — ABNORMAL LOW
Glucose, Bld: 142 — ABNORMAL HIGH
Phosphorus: 5.5 — ABNORMAL HIGH
Potassium: 3.8
Sodium: 138

## 2011-09-25 LAB — LIPASE, BLOOD: Lipase: 30

## 2011-09-25 LAB — IRON AND TIBC
Iron: 184 — ABNORMAL HIGH
UIBC: 20

## 2011-09-25 LAB — URINE MICROSCOPIC-ADD ON

## 2011-09-25 LAB — CULTURE, BLOOD (ROUTINE X 2)
Culture: NO GROWTH
Culture: NO GROWTH

## 2011-09-25 LAB — FOLATE: Folate: 6.6

## 2011-09-25 LAB — POCT PREGNANCY, URINE: Preg Test, Ur: NEGATIVE

## 2011-09-25 LAB — FERRITIN: Ferritin: 977 — ABNORMAL HIGH (ref 10–291)

## 2011-09-25 LAB — H. PYLORI ANTIBODY, IGG: H Pylori IgG: <0.4

## 2011-09-25 LAB — TSH: TSH: 2.128

## 2011-09-25 LAB — PREPARE RBC (CROSSMATCH)

## 2011-09-25 LAB — VITAMIN B12: Vitamin B-12: 228 (ref 211–911)

## 2011-09-26 LAB — CBC
HCT: 29.9 — ABNORMAL LOW
Hemoglobin: 9.8 — ABNORMAL LOW
MCHC: 32.8
MCV: 84.8
Platelets: 304
RBC: 3.52 — ABNORMAL LOW
RDW: 14.9
WBC: 12.8 — ABNORMAL HIGH

## 2011-09-26 LAB — CROSSMATCH
ABO/RH(D): O POS
Antibody Screen: NEGATIVE

## 2011-09-26 LAB — BASIC METABOLIC PANEL
BUN: 37 — ABNORMAL HIGH
CO2: 22
Calcium: 10.7 — ABNORMAL HIGH
Chloride: 105
Creatinine, Ser: 4.08 — ABNORMAL HIGH
GFR calc Af Amer: 15 — ABNORMAL LOW
GFR calc non Af Amer: 13 — ABNORMAL LOW
Glucose, Bld: 97
Potassium: 3.3 — ABNORMAL LOW
Sodium: 137

## 2011-09-26 LAB — CULTURE, ROUTINE-ABSCESS

## 2011-09-26 LAB — PREGNANCY, URINE: Preg Test, Ur: NEGATIVE

## 2011-09-26 LAB — ANAEROBIC CULTURE

## 2011-09-26 LAB — GLUCOSE, CAPILLARY: Glucose-Capillary: 88

## 2011-09-28 LAB — COMPREHENSIVE METABOLIC PANEL
ALT: 10
ALT: 10
AST: 11
AST: 9
Albumin: 3 — ABNORMAL LOW
Albumin: 3 — ABNORMAL LOW
Alkaline Phosphatase: 74
Alkaline Phosphatase: 75
BUN: 40 — ABNORMAL HIGH
BUN: 41 — ABNORMAL HIGH
CO2: 20
CO2: 20
Calcium: 10.3
Calcium: 11 — ABNORMAL HIGH
Chloride: 103
Chloride: 103
Creatinine, Ser: 4.58 — ABNORMAL HIGH
Creatinine, Ser: 4.63 — ABNORMAL HIGH
GFR calc Af Amer: 13 — ABNORMAL LOW
GFR calc Af Amer: 13 — ABNORMAL LOW
GFR calc non Af Amer: 11 — ABNORMAL LOW
GFR calc non Af Amer: 11 — ABNORMAL LOW
Glucose, Bld: 218 — ABNORMAL HIGH
Glucose, Bld: 305 — ABNORMAL HIGH
Potassium: 3.5
Potassium: 3.5
Sodium: 134 — ABNORMAL LOW
Sodium: 135
Total Bilirubin: 0.4
Total Bilirubin: 0.4
Total Protein: 6.4
Total Protein: 6.4

## 2011-09-28 LAB — CBC
HCT: 35.3 — ABNORMAL LOW
HCT: 37.7
Hemoglobin: 11.6 — ABNORMAL LOW
Hemoglobin: 12.5
MCHC: 32.9
MCHC: 33.3
MCV: 82.9
MCV: 83.1
Platelets: 422 — ABNORMAL HIGH
Platelets: 448 — ABNORMAL HIGH
RBC: 4.25
RBC: 4.54
RDW: 14.7
RDW: 15.2
WBC: 10.4
WBC: 17.2 — ABNORMAL HIGH

## 2011-09-28 LAB — DIFFERENTIAL
Basophils Absolute: 0
Basophils Absolute: 0
Basophils Relative: 0
Basophils Relative: 0
Eosinophils Absolute: 0.1
Eosinophils Absolute: 0.1
Eosinophils Relative: 0
Eosinophils Relative: 1
Lymphocytes Relative: 16
Lymphocytes Relative: 6 — ABNORMAL LOW
Lymphs Abs: 1.1
Lymphs Abs: 1.7
Monocytes Absolute: 0.1
Monocytes Absolute: 0.6
Monocytes Relative: 0 — ABNORMAL LOW
Monocytes Relative: 6
Neutro Abs: 16 — ABNORMAL HIGH
Neutro Abs: 8 — ABNORMAL HIGH
Neutrophils Relative %: 77
Neutrophils Relative %: 93 — ABNORMAL HIGH

## 2011-09-28 LAB — CULTURE, BLOOD (ROUTINE X 2)
Culture: NO GROWTH
Culture: NO GROWTH

## 2011-09-28 LAB — PHOSPHORUS: Phosphorus: 5.2 — ABNORMAL HIGH

## 2011-09-28 LAB — I-STAT 8, (EC8 V) (CONVERTED LAB)
Acid-Base Excess: 6 — ABNORMAL HIGH
BUN: 39 — ABNORMAL HIGH
Bicarbonate: 28.5 — ABNORMAL HIGH
Chloride: 102
Glucose, Bld: 198 — ABNORMAL HIGH
HCT: 39
Hemoglobin: 13.3
Operator id: 205141
Potassium: 3.5
Sodium: 133 — ABNORMAL LOW
TCO2: 30
pCO2, Ven: 33.5 — ABNORMAL LOW
pH, Ven: 7.538 — ABNORMAL HIGH

## 2011-09-28 LAB — CK TOTAL AND CKMB (NOT AT ARMC)
CK, MB: 1.4
Relative Index: INVALID
Total CK: 35

## 2011-09-28 LAB — URINE CULTURE
Colony Count: 55000
Special Requests: NEGATIVE

## 2011-09-28 LAB — URINALYSIS, ROUTINE W REFLEX MICROSCOPIC
Bilirubin Urine: NEGATIVE
Glucose, UA: 100 — AB
Ketones, ur: NEGATIVE
Leukocytes, UA: NEGATIVE
Nitrite: NEGATIVE
Protein, ur: 100 — AB
Specific Gravity, Urine: 1.025
Urobilinogen, UA: 0.2
pH: 6.5

## 2011-09-28 LAB — URINE MICROSCOPIC-ADD ON

## 2011-09-28 LAB — TROPONIN I: Troponin I: 0.07 — ABNORMAL HIGH

## 2011-09-28 LAB — D-DIMER, QUANTITATIVE (NOT AT ARMC): D-Dimer, Quant: 0.81 — ABNORMAL HIGH

## 2011-09-28 LAB — MAGNESIUM: Magnesium: 1.6

## 2011-09-28 LAB — CYTOMEGALOVIRUS PCR, QUALITATIVE: Cytomegalovirus DNA: NOT DETECTED

## 2011-09-28 LAB — POCT I-STAT CREATININE
Creatinine, Ser: 4.1 — ABNORMAL HIGH
Operator id: 205141

## 2011-09-28 LAB — TACROLIMUS, BLOOD: Tacrolimus Lvl: 13.4

## 2011-10-01 LAB — DIFFERENTIAL
Basophils Absolute: 0
Basophils Relative: 0
Eosinophils Absolute: 0 — ABNORMAL LOW
Eosinophils Relative: 0
Lymphocytes Relative: 8 — ABNORMAL LOW
Lymphs Abs: 0.8
Monocytes Absolute: 0.2
Monocytes Relative: 2 — ABNORMAL LOW
Neutro Abs: 9.8 — ABNORMAL HIGH
Neutrophils Relative %: 90 — ABNORMAL HIGH

## 2011-10-01 LAB — POCT CARDIAC MARKERS
CKMB, poc: <1 — ABNORMAL LOW
Myoglobin, poc: 159
Operator id: 166561
Troponin i, poc: <0.05

## 2011-10-01 LAB — CBC
HCT: 33.7 — ABNORMAL LOW
Hemoglobin: 10.7 — ABNORMAL LOW
MCHC: 31.7
MCV: 86.1
Platelets: 312
RBC: 3.92
RDW: 15.8 — ABNORMAL HIGH
WBC: 10.8 — ABNORMAL HIGH

## 2011-10-01 LAB — BASIC METABOLIC PANEL
BUN: 29 — ABNORMAL HIGH
CO2: 22
Calcium: 6.4 — CL
Chloride: 98
Creatinine, Ser: 3.02 — ABNORMAL HIGH
GFR calc Af Amer: 22 — ABNORMAL LOW
GFR calc non Af Amer: 18 — ABNORMAL LOW
Glucose, Bld: 283 — ABNORMAL HIGH
Potassium: 3.9
Sodium: 135

## 2011-10-01 LAB — ALBUMIN: Albumin: 3.4 — ABNORMAL LOW

## 2011-11-06 ENCOUNTER — Emergency Department (HOSPITAL_COMMUNITY): Payer: Medicare Other

## 2011-11-06 ENCOUNTER — Other Ambulatory Visit: Payer: Self-pay

## 2011-11-06 ENCOUNTER — Encounter (HOSPITAL_COMMUNITY): Payer: Self-pay | Admitting: Emergency Medicine

## 2011-11-06 ENCOUNTER — Emergency Department (HOSPITAL_COMMUNITY)
Admission: EM | Admit: 2011-11-06 | Discharge: 2011-11-07 | Disposition: A | Discharge status: Home / Self Care | Payer: Medicare Other | Attending: Emergency Medicine | Admitting: Emergency Medicine

## 2011-11-06 DIAGNOSIS — R110 Nausea: Secondary | ICD-10-CM | POA: Insufficient documentation

## 2011-11-06 DIAGNOSIS — R51 Headache: Secondary | ICD-10-CM

## 2011-11-06 DIAGNOSIS — F410 Panic disorder [episodic paroxysmal anxiety] without agoraphobia: Secondary | ICD-10-CM | POA: Insufficient documentation

## 2011-11-06 DIAGNOSIS — K5090 Crohn's disease, unspecified, without complications: Secondary | ICD-10-CM | POA: Insufficient documentation

## 2011-11-06 DIAGNOSIS — R0602 Shortness of breath: Secondary | ICD-10-CM | POA: Insufficient documentation

## 2011-11-06 DIAGNOSIS — J45909 Unspecified asthma, uncomplicated: Secondary | ICD-10-CM | POA: Insufficient documentation

## 2011-11-06 DIAGNOSIS — F411 Generalized anxiety disorder: Secondary | ICD-10-CM | POA: Insufficient documentation

## 2011-11-06 DIAGNOSIS — Z940 Kidney transplant status: Secondary | ICD-10-CM | POA: Insufficient documentation

## 2011-11-06 DIAGNOSIS — Z794 Long term (current) use of insulin: Secondary | ICD-10-CM | POA: Insufficient documentation

## 2011-11-06 DIAGNOSIS — N289 Disorder of kidney and ureter, unspecified: Secondary | ICD-10-CM | POA: Insufficient documentation

## 2011-11-06 DIAGNOSIS — D638 Anemia in other chronic diseases classified elsewhere: Secondary | ICD-10-CM | POA: Insufficient documentation

## 2011-11-06 HISTORY — DX: Anemia, unspecified: D64.9

## 2011-11-06 HISTORY — DX: Panic disorder (episodic paroxysmal anxiety): F41.0

## 2011-11-06 HISTORY — DX: Anxiety disorder, unspecified: F41.9

## 2011-11-06 HISTORY — DX: Crohn's disease, unspecified, without complications: K50.90

## 2011-11-06 LAB — GLUCOSE, CAPILLARY: Glucose-Capillary: 217 mg/dL — ABNORMAL HIGH (ref 70–99)

## 2011-11-06 MED ORDER — ACETAMINOPHEN 500 MG PO TABS
1000.0000 mg | ORAL_TABLET | Freq: Once | ORAL | Status: AC
  Administered 2011-11-06: 1000 mg via ORAL
  Filled 2011-11-06: qty 2

## 2011-11-06 MED ORDER — LORAZEPAM 2 MG/ML IJ SOLN
1.0000 mg | Freq: Once | INTRAMUSCULAR | Status: DC
  Filled 2011-11-06: qty 1

## 2011-11-06 MED ORDER — DIPHENHYDRAMINE HCL 50 MG/ML IJ SOLN
25.0000 mg | Freq: Once | INTRAMUSCULAR | Status: DC
  Filled 2011-11-06: qty 1

## 2011-11-06 MED ORDER — DIPHENHYDRAMINE HCL 50 MG/ML IJ SOLN
50.0000 mg | Freq: Once | INTRAMUSCULAR | Status: AC
  Administered 2011-11-06: 50 mg via INTRAMUSCULAR

## 2011-11-06 MED ORDER — KETOROLAC TROMETHAMINE 60 MG/2ML IM SOLN
60.0000 mg | Freq: Once | INTRAMUSCULAR | Status: AC
  Administered 2011-11-06: 60 mg via INTRAMUSCULAR
  Filled 2011-11-06: qty 2

## 2011-11-06 MED ORDER — LORAZEPAM 2 MG/ML IJ SOLN
1.0000 mg | Freq: Once | INTRAMUSCULAR | Status: AC
  Administered 2011-11-06: 1 mg via INTRAMUSCULAR

## 2011-11-06 MED ORDER — HYDROMORPHONE HCL PF 1 MG/ML IJ SOLN
1.0000 mg | Freq: Once | INTRAMUSCULAR | Status: AC
  Administered 2011-11-06: 1 mg via INTRAMUSCULAR
  Filled 2011-11-06: qty 1

## 2011-11-06 MED ORDER — ONDANSETRON 8 MG PO TBDP
8.0000 mg | ORAL_TABLET | Freq: Once | ORAL | Status: AC
  Administered 2011-11-06: 8 mg via ORAL
  Filled 2011-11-06: qty 1

## 2011-11-06 NOTE — ED Provider Notes (Signed)
Medical screening examination/treatment/procedure(s) were conducted as a shared visit with non-physician practitioner(s) and myself.  I personally evaluated the patient during the encounter Pt with some dyspnea and headache.  Dyspnea inproved.  pe heent neg lungs clear heart rrr.    Maudry Diego, MD 11/06/11 [phone]

## 2011-11-06 NOTE — ED Notes (Signed)
IV attempted by 2 RN's (myself and A. Anderson) unsuccessful. Olga Millers PA aware.

## 2011-11-06 NOTE — ED Provider Notes (Signed)
History     CSN: [id number] Arrival date & time: 11/06/2011  4:08 PM   First MD Initiated Contact with Patient 11/06/11 1612      Chief Complaint  Patient presents with  . Shortness of Breath  . Muscle Pain  . Headache    (Consider location/radiation/quality/duration/timing/severity/associated sxs/prior treatment) HPI Comments: Patient believes she is having an allergic reaction to immunoglobulin infusion she received yesterday at [facility].  She is a kidney transplant recipient and has been found to have increased antibodies so was given the immunoglobulin to reduce the risk of kidney rejection. She states she felt slightly sob yesterday before she left [facility],  But was felt this would resolve.  Instead she has worsening symptoms  Including severe headache,  Anxious feeling,  Tremor,  Shortness of breath along with neck and back pain.  She has had immunoglobulin before, but this was years ago.  She denies fever,  Nausea,  Vomiting and rash.  She did take an ativan 1 mg tablet several hours ago which helped ease her anxiety briefly.  She denies chest pain.  The history is provided by the patient.    Past Medical History  Diagnosis Date  . Renal disorder   . Asthma   . Chronic anemia   . Panic disorder   . Anxiety   . Crohn's disease     Past Surgical History  Procedure Date  . Nephrectomy transplanted organ   . Parathyroidectomy   . Tubal ligation     History reviewed. No pertinent family history.  History  Substance Use Topics  . Smoking status: Never Smoker   . Smokeless tobacco: Never Used  . Alcohol Use: No    OB History    Grav Para Term Preterm Abortions TAB SAB Ect Mult Living                  Review of Systems  Constitutional: Negative for fever.  HENT: Negative for congestion, sore throat, neck pain and neck stiffness.   Eyes: Negative.   Respiratory: Positive for chest tightness.   Cardiovascular: Negative for chest pain.  Genitourinary: Negative.    Musculoskeletal: Negative for joint swelling and arthralgias.  Skin: Negative.  Negative for wound.  Neurological: Positive for headaches. Negative for light-headedness and numbness.  Hematological: Negative.   Psychiatric/Behavioral: The patient is nervous/anxious.     Allergies  Ivp dye; Dilaudid; Sulfa antibiotics; and Vancomycin  Home Medications   Current Outpatient Rx  Name Route Sig Dispense Refill  . ALBUTEROL SULFATE HFA 108 (90 BASE) MCG/ACT IN AERS Inhalation Inhale 2 puffs into the lungs every 6 (six) hours as needed. For shortness of breath     . ALPRAZOLAM 1 MG PO TABS Oral Take 1 mg by mouth 3 (three) times daily as needed. For anxiety    . AMLODIPINE BESYLATE 5 MG PO TABS Oral Take 5 mg by mouth daily.      . BUSPIRONE HCL 15 MG PO TABS Oral Take 15 mg by mouth 3 (three) times daily.      Marland Kitchen FLUOCINONIDE 0.05 % EX CREA Topical Apply 1 application topically 3 (three) times daily.      Marland Kitchen GLIPIZIDE 2.5 MG HALF TABLET Oral Take 2.5 mg by mouth daily before breakfast.      . HYDROXYZINE HCL 50 MG PO TABS Oral Take 50 mg by mouth at bedtime.      . INSULIN ASPART 100 UNIT/ML [place] SOLN Subcutaneous Inject 4 Units into the skin 3 (  three) times daily before meals.      . INSULIN GLARGINE 100 UNIT/ML [place] SOLN Subcutaneous Inject 8 Units into the skin at bedtime.      Marland Kitchen LORAZEPAM 1 MG PO TABS Oral Take 1 mg by mouth as needed. For anxiety     . THERA M PLUS PO TABS Oral Take 1 tablet by mouth daily.      Marland Kitchen MYCOPHENOLATE SODIUM 180 MG PO TBEC Oral Take 360 mg by mouth 2 (two) times daily.     Marland Kitchen OMEPRAZOLE 20 MG PO CPDR Oral Take 40 mg by mouth 2 (two) times daily.     . OXYCODONE HCL 5 MG PO TABS Oral Take 10 mg by mouth every 8 (eight) hours as needed. for chronic knee and back pain    . PROMETHAZINE HCL 25 MG PO TABS Oral Take 50 mg by mouth as needed. For nausea    . SERTRALINE HCL 50 MG PO TABS Oral Take 200 mg by mouth every morning.      Marland Kitchen SIMVASTATIN 20 MG PO TABS Oral Take 40  mg by mouth at bedtime.     Marland Kitchen SITAGLIPTIN PHOSPHATE 50 MG PO TABS Oral Take 50 mg by mouth daily.      Marland Kitchen TACROLIMUS 1 MG PO CAPS Oral Take 6 mg by mouth 2 (two) times daily.     Marland Kitchen TIOTROPIUM BROMIDE MONOHYDRATE 18 MCG IN CAPS Inhalation Place 18 mcg into inhaler and inhale every morning.      Marland Kitchen ZOLPIDEM TARTRATE 10 MG PO TABS Oral Take 10 mg by mouth at bedtime as needed. For sleep      BP 133/86  Pulse 97  Temp(Src) 99.5 F (37.5 C) (Oral)  Resp 22  Ht 5\' 3"  (1.6 m)  Wt 240 lb (108.863 kg)  BMI 42.51 kg/m2  SpO2 96%  LMP 11/02/2011  Physical Exam  Nursing note and vitals reviewed. Constitutional: She is oriented to person, place, and time. She appears well-developed and well-nourished.       Anxious.  HENT:  Head: Normocephalic and atraumatic.  Mouth/Throat: Oropharynx is clear and moist.  Eyes: Conjunctivae and EOM are normal. Pupils are equal, round, and reactive to light.  Neck: Normal range of motion. Neck supple.  Cardiovascular: Normal rate, regular rhythm, normal heart sounds and intact distal pulses.   Pulmonary/Chest: Effort normal and breath sounds normal. She has no wheezes. She has no rales. She exhibits no tenderness.  Abdominal: Soft. Bowel sounds are normal. There is tenderness. There is no rebound and no guarding.       Modest ttp llq at site of kidney biopsy - no erythema or induration.  Musculoskeletal: Normal range of motion.  Lymphadenopathy:    She has no cervical adenopathy.  Neurological: She is alert and oriented to person, place, and time. She has normal strength. No cranial nerve deficit or sensory deficit. She displays a negative Romberg sign. Gait normal. GCS eye subscore is 4. GCS verbal subscore is 5. GCS motor subscore is 6.  Skin: Skin is warm and dry. No rash noted.  Psychiatric: Her speech is normal and behavior is normal. Thought content normal. Her mood appears anxious. Cognition and memory are normal.    ED Course  Procedures (including  critical care time)  Labs Reviewed - No data to display Dg Chest [facility] 1 View  11/06/2011  *RADIOLOGY REPORT*  Clinical Data: Shortness of breath.  PORTABLE CHEST - 1 VIEW  Comparison: Chest x-ray 09/20/2011.  Findings:  The heart is borderline in size but stable.  There is mild vascular congestion but no infiltrates, edema or effusions. The bony thorax is intact.  IMPRESSION: Stable borderline heart size and mild vascular congestion but no effusions, edema or infiltrates.  Original Report Authenticated By: P. Kalman Jewels, M.D.     No diagnosis found.  19:34 - pts anxiety and tremor resolved with benadryl 50mg , ativan 1 mg IM.  Now with complaint of headache and slight nausea.  Zofran 8 odt,  Tylenol 1 gram given with no relief.    20:00 - dilaudid 1 mg IM given with improvement of headache from 10 to 8.    23:00 - toradol 60 IM - headache almost resolved.  Head Ct negative.    MDM  Possible side effect from immunoglobulin infusion, however cannot rule out stress,  Anxiety as major contributor to sx.  Patient without any concerning physical exam findings.  Much more calm and relaxed at dc.    Patients labs and/or radiological studies were reviewed during the medical decision making and disposition process.         Fulton Reek, PA 11/07/11 [phone]

## 2011-11-06 NOTE — ED Notes (Signed)
Patient brought in via EMS. Alert and oriented. Airway patent. Per EMS patient seen nephrologist yesterday and received immunoglobulin. Per patient first time receiving this. Patient now c/o headache, shortness of breath, blurred vision, neck pain, and back pain.  Patient anxious.

## 2011-11-06 NOTE — ED Notes (Signed)
Went into patient room to hook back up to monitor. Patient complaining of sweating. No other complaints at this time.

## 2011-11-07 NOTE — ED Provider Notes (Signed)
Medical screening examination/treatment/procedure(s) were performed by non-physician practitioner and as supervising physician I was immediately available for consultation/collaboration.   Maudry Diego, MD 11/07/11 [phone]

## 2012-01-08 ENCOUNTER — Ambulatory Visit (INDEPENDENT_AMBULATORY_CARE_PROVIDER_SITE_OTHER): Payer: Medicare Other | Admitting: Urology

## 2012-01-08 DIAGNOSIS — Z940 Kidney transplant status: Secondary | ICD-10-CM

## 2012-01-08 DIAGNOSIS — C649 Malignant neoplasm of unspecified kidney, except renal pelvis: Secondary | ICD-10-CM

## 2012-01-10 ENCOUNTER — Emergency Department (HOSPITAL_COMMUNITY)
Admission: EM | Admit: 2012-01-10 | Discharge: 2012-01-10 | Disposition: A | Discharge status: Home / Self Care | Payer: Medicare Other | Attending: Emergency Medicine | Admitting: Emergency Medicine

## 2012-01-10 DIAGNOSIS — L02219 Cutaneous abscess of trunk, unspecified: Secondary | ICD-10-CM | POA: Insufficient documentation

## 2012-01-10 DIAGNOSIS — R109 Unspecified abdominal pain: Secondary | ICD-10-CM | POA: Insufficient documentation

## 2012-01-10 DIAGNOSIS — Z79899 Other long term (current) drug therapy: Secondary | ICD-10-CM | POA: Insufficient documentation

## 2012-01-10 DIAGNOSIS — E669 Obesity, unspecified: Secondary | ICD-10-CM | POA: Insufficient documentation

## 2012-01-10 DIAGNOSIS — Z794 Long term (current) use of insulin: Secondary | ICD-10-CM | POA: Insufficient documentation

## 2012-01-10 DIAGNOSIS — R509 Fever, unspecified: Secondary | ICD-10-CM | POA: Insufficient documentation

## 2012-01-10 DIAGNOSIS — R10819 Abdominal tenderness, unspecified site: Secondary | ICD-10-CM | POA: Insufficient documentation

## 2012-01-10 DIAGNOSIS — N289 Disorder of kidney and ureter, unspecified: Secondary | ICD-10-CM

## 2012-01-10 DIAGNOSIS — L03319 Cellulitis of trunk, unspecified: Secondary | ICD-10-CM | POA: Insufficient documentation

## 2012-01-10 DIAGNOSIS — L0390 Cellulitis, unspecified: Secondary | ICD-10-CM

## 2012-01-10 DIAGNOSIS — R21 Rash and other nonspecific skin eruption: Secondary | ICD-10-CM | POA: Insufficient documentation

## 2012-01-10 LAB — URINALYSIS, ROUTINE W REFLEX MICROSCOPIC
Bilirubin Urine: NEGATIVE
Glucose, UA: 100 mg/dL — AB
Hgb urine dipstick: NEGATIVE
Ketones, ur: NEGATIVE mg/dL
Nitrite: NEGATIVE
Protein, ur: NEGATIVE mg/dL
Specific Gravity, Urine: 1.022 (ref 1.005–1.030)
Urobilinogen, UA: 0.2 mg/dL (ref 0.0–1.0)
pH: 5.5 (ref 5.0–8.0)

## 2012-01-10 LAB — CBC
HCT: 39.1 % (ref 36.0–46.0)
Hemoglobin: 12.7 g/dL (ref 12.0–15.0)
MCH: 26.9 pg (ref 26.0–34.0)
MCHC: 32.5 g/dL (ref 30.0–36.0)
MCV: 82.8 fL (ref 78.0–100.0)
Platelets: 272 10*3/uL (ref 150–400)
RBC: 4.72 MIL/uL (ref 3.87–5.11)
RDW: 14.1 % (ref 11.5–15.5)
WBC: 9.1 10*3/uL (ref 4.0–10.5)

## 2012-01-10 LAB — BASIC METABOLIC PANEL
BUN: 18 mg/dL (ref 6–23)
CO2: 17 mEq/L — ABNORMAL LOW (ref 19–32)
Calcium: 8 mg/dL — ABNORMAL LOW (ref 8.4–10.5)
Chloride: 99 mEq/L (ref 96–112)
Creatinine, Ser: 1.54 mg/dL — ABNORMAL HIGH (ref 0.50–1.10)
GFR calc Af Amer: 49 mL/min — ABNORMAL LOW (ref >90)
GFR calc non Af Amer: 42 mL/min — ABNORMAL LOW (ref >90)
Glucose, Bld: 135 mg/dL — ABNORMAL HIGH (ref 70–99)
Potassium: 3.8 mEq/L (ref 3.5–5.1)
Sodium: 133 mEq/L — ABNORMAL LOW (ref 135–145)

## 2012-01-10 LAB — URINE MICROSCOPIC-ADD ON

## 2012-01-10 LAB — DIFFERENTIAL
Basophils Absolute: 0 10*3/uL (ref 0.0–0.1)
Basophils Relative: 0 % (ref 0–1)
Eosinophils Absolute: 0.2 10*3/uL (ref 0.0–0.7)
Eosinophils Relative: 2 % (ref 0–5)
Lymphocytes Relative: 15 % (ref 12–46)
Lymphs Abs: 1.3 10*3/uL (ref 0.7–4.0)
Monocytes Absolute: 0.7 10*3/uL (ref 0.1–1.0)
Monocytes Relative: 8 % (ref 3–12)
Neutro Abs: 6.9 10*3/uL (ref 1.7–7.7)
Neutrophils Relative %: 76 % (ref 43–77)

## 2012-01-10 LAB — CULTURE, BLOOD (ROUTINE X 2)
Culture  Setup Time: 201301180233
Culture: NO GROWTH

## 2012-01-10 LAB — PROCALCITONIN: Procalcitonin: 0.27 ng/mL

## 2012-01-10 LAB — LACTIC ACID, PLASMA: Lactic Acid, Venous: 1.2 mmol/L (ref 0.5–2.2)

## 2012-01-10 MED ORDER — SODIUM CHLORIDE 0.9 % IV SOLN: INTRAVENOUS | Status: DC

## 2012-01-10 MED ORDER — CIPROFLOXACIN HCL 500 MG PO TABS: 500.0000 mg | ORAL_TABLET | Freq: Two times a day (BID) | ORAL | Status: AC

## 2012-01-10 MED ORDER — OXYCODONE-ACETAMINOPHEN 5-325 MG PO TABS: 2.0000 | ORAL_TABLET | ORAL | Status: AC | PRN

## 2012-01-10 NOTE — ED Notes (Signed)
Unable to get all labs. MD aware and will cancel last set of blood cultures.

## 2012-01-10 NOTE — ED Notes (Signed)
IV Team paged.

## 2012-01-10 NOTE — ED Provider Notes (Signed)
History     CSN: [id number]  Arrival date & time 01/10/12  1653   First MD Initiated Contact with Patient 01/10/12 1813      Chief Complaint  Patient presents with  . Abdominal Pain    (Consider location/radiation/quality/duration/timing/severity/associated sxs/prior treatment) Patient is a 36 y.o. female presenting with abdominal pain. The history is provided by the patient.  Abdominal Pain The primary symptoms of the illness include abdominal pain and fever. The primary symptoms of the illness do not include shortness of breath, nausea, vomiting, diarrhea or dysuria.   the patient is a 36 year old, female, who complains of lower abdominal pain that feels like him.  He has kicked her in abdomen, along with redness.  Mild swelling and intermittent fevers for the past 4 days.  She denies nausea, vomiting, cough, difficulty breathing, vaginal discharge, diarrhea, or dysuria.  She takes Bactrim chronically to suppress urinary tract infections.  She has had bilateral kidney transplants and she is on 3. Immunomodulator meds.  She saw her nephrologist yesterday, who performed a urinalysis, which did not show a urinary tract infection.  Past Medical History  Diagnosis Date  . Renal disorder   . Asthma   . Chronic anemia   . Panic disorder   . Anxiety   . Crohn's disease     Past Surgical History  Procedure Date  . Nephrectomy transplanted organ   . Parathyroidectomy   . Tubal ligation     No family history on file.  History  Substance Use Topics  . Smoking status: Never Smoker   . Smokeless tobacco: Never Used  . Alcohol Use: No    OB History    Grav Para Term Preterm Abortions TAB SAB Ect Mult Living                  Review of Systems  Constitutional: Positive for fever.  Respiratory: Negative for cough and shortness of breath.   Cardiovascular: Negative for chest pain.  Gastrointestinal: Positive for abdominal pain. Negative for nausea, vomiting and diarrhea.    Genitourinary: Negative for dysuria.  Skin: Positive for color change and rash.       Erythema across her entire lower abdomen, inferior to the umbilicus and just above the mons pubis  Neurological: Negative for headaches.  Psychiatric/Behavioral: Negative for confusion.  All other systems reviewed and are negative.    Allergies  Dilaudid; Ivp dye; Vancomycin; Barium-containing compounds; Iopamidol; Shellfish allergy; and Sulfa antibiotics  Home Medications   Current Outpatient Rx  Name Route Sig Dispense Refill  . ALBUTEROL SULFATE HFA 108 (90 BASE) MCG/ACT IN AERS Inhalation Inhale 2 puffs into the lungs every 6 (six) hours as needed. For shortness of breath     . ALPRAZOLAM 1 MG PO TABS Oral Take 1 mg by mouth 3 (three) times daily as needed. For anxiety    . AMLODIPINE BESYLATE 5 MG PO TABS Oral Take 5 mg by mouth daily.      . BUSPIRONE HCL 15 MG PO TABS Oral Take 15 mg by mouth 3 (three) times daily.      Marland Kitchen CALCITRIOL 0.5 MCG PO CAPS Oral Take 1 mcg by mouth daily.    . CYCLOBENZAPRINE HCL 10 MG PO TABS Oral Take 10 mg by mouth at bedtime.    Marland Kitchen FLUOCINONIDE 0.05 % EX CREA Topical Apply 1 application topically 3 (three) times daily.      Marland Kitchen GLIPIZIDE 2.5 MG HALF TABLET Oral Take 2.5 mg by mouth at  bedtime.     Marland Kitchen GLIPIZIDE 5 MG PO TABS Oral Take 5 mg by mouth daily after breakfast.    . HYDROXYZINE HCL 50 MG PO TABS Oral Take 50 mg by mouth at bedtime.      . INSULIN ASPART 100 UNIT/ML [place] SOLN Subcutaneous Inject 4 Units into the skin 3 (three) times daily before meals.      . INSULIN GLARGINE 100 UNIT/ML [place] SOLN Subcutaneous Inject 8 Units into the skin at bedtime.      Marland Kitchen LORAZEPAM 1 MG PO TABS Oral Take 1 mg by mouth as needed. For anxiety     . MESALAMINE 1.2 G PO TBEC Oral Take 2.6 g by mouth 3 (three) times daily.    [place] M PLUS PO TABS Oral Take 1 tablet by mouth daily.      Marland Kitchen MYCOPHENOLATE SODIUM 180 MG PO TBEC Oral Take 360 mg by mouth 2 (two) times daily.     Marland Kitchen  OMEPRAZOLE 20 MG PO CPDR Oral Take 40 mg by mouth 4 (four) times daily.     . OXYCODONE HCL 5 MG PO TABS Oral Take 10 mg by mouth every 8 (eight) hours as needed. for chronic knee and back pain    . PREDNISONE 10 MG PO TABS Oral Take 10 mg by mouth daily.    Marland Kitchen PROMETHAZINE HCL 25 MG PO TABS Oral Take 50 mg by mouth as needed. For nausea    . SERTRALINE HCL 50 MG PO TABS Oral Take 200 mg by mouth every morning.     Marland Kitchen SITAGLIPTIN PHOSPHATE 50 MG PO TABS Oral Take 50 mg by mouth daily.      . SODIUM BICARBONATE 650 MG PO TABS Oral Take 650 mg by mouth 3 (three) times daily.    Marland Kitchen TACROLIMUS 1 MG PO CAPS Oral Take 6 mg by mouth 2 (two) times daily.     Marland Kitchen TIOTROPIUM BROMIDE MONOHYDRATE 18 MCG IN CAPS Inhalation Place 18 mcg into inhaler and inhale every morning.      Marland Kitchen ZOLPIDEM TARTRATE 10 MG PO TABS Oral Take 10 mg by mouth at bedtime as needed. For sleep      BP 110/72  Pulse 90  Temp(Src) 98.8 F (37.1 C) (Oral)  Resp 18  SpO2 100%  LMP 12/30/2011  Physical Exam  Constitutional: She is oriented to person, place, and time. No distress.       Obese  HENT:  Head: Normocephalic and atraumatic.  Eyes: Pupils are equal, round, and reactive to light.  Neck: Normal range of motion. Neck supple.  Cardiovascular: Normal rate.   No murmur heard. Pulmonary/Chest: Effort normal. No respiratory distress.  Abdominal: Soft. She exhibits no distension and no mass. There is tenderness. There is no rebound and no guarding.       She has erythema, and mild tenderness across her entire lower abdomen.  He appeared to the umbilicus, consistent with cellulitis.  Musculoskeletal: Normal range of motion.  Neurological: She is alert and oriented to person, place, and time.  Skin: Skin is warm and dry. There is erythema.  Psychiatric: She has a normal mood and affect.    ED Course  Procedures (including critical care time) 36 year old immunocompromised, female, presents with intermittent fevers, and  cellulitis on her lower abdomen.  She is in no distress.  She does not want any medications at this time.  We will establish an IV and perform laboratory testing, including blood cultures, and then discuss  further evaluation and treatment.  With her nephrologist.  Labs Reviewed  CBC  DIFFERENTIAL  BASIC METABOLIC PANEL  URINALYSIS, ROUTINE W REFLEX MICROSCOPIC  PROTEIN AND GLUCOSE, CSF  CULTURE, BLOOD (ROUTINE X 2)  CULTURE, BLOOD (ROUTINE X 2)  PROCALCITONIN  LACTIC ACID, PLASMA   No results found.   No diagnosis found.  9:30 PM Explained that still waiting for more labs.  She still does not want analgesics  MDM  Abdominal pain Cellulitis No signs of systemic infection.   Nontoxic. No acute abdomen.        Elmer Picker, MD 01/10/12 2229

## 2012-01-10 NOTE — ED Notes (Signed)
Per Pt, lower right abd. Discomfort since Monday with lower abdominal swelling and warmth per pt. Pt is bilateral kidney transplant pt. Had fever since Tuesday of up to 101.6 (today @ 4am). Urinalysis was negative yesterday per pt. Sent for culture, on Bactrim regularly. Pt reports that she had urinary frequency two days ago, which has since subsided. Feels urgency but then "when I go there's very very little". Pain in lower right abd is 6/10, gets worse in evening.

## 2012-01-15 ENCOUNTER — Other Ambulatory Visit: Payer: Self-pay | Admitting: Urology

## 2012-02-03 ENCOUNTER — Encounter (HOSPITAL_COMMUNITY): Payer: Self-pay

## 2012-02-08 ENCOUNTER — Encounter (HOSPITAL_COMMUNITY)
Admission: RE | Admit: 2012-02-08 | Discharge: 2012-02-08 | Disposition: A | Discharge status: Home / Self Care | Payer: BC Managed Care – PPO | Source: Ambulatory Visit | Attending: Obstetrics and Gynecology | Admitting: Obstetrics and Gynecology

## 2012-02-08 ENCOUNTER — Encounter (HOSPITAL_COMMUNITY): Payer: Self-pay

## 2012-02-08 HISTORY — DX: Adverse effect of unspecified anesthetic, initial encounter: T41.45XA

## 2012-02-08 HISTORY — DX: Other complications of anesthesia, initial encounter: T88.59XA

## 2012-02-08 LAB — SURGICAL PCR SCREEN
MRSA, PCR: NEGATIVE
Staphylococcus aureus: NEGATIVE

## 2012-02-08 NOTE — Patient Instructions (Addendum)
[place]  02/08/2012   Your procedure is scheduled on:  02/13/12 Enter through the Main Entrance of [facility] at 7 AM.  Pick up the phone at the desk and dial [phone].   Call this number if you have problems the morning of surgery: [phone]   Remember:   Do not eat food:After Midnight.  Do not drink clear liquids: After Midnight.  Take these medicines the morning of surgery with A SIP OF WATER: all medications discussed with Dr.Foster at Pre op  Do not wear jewelry, make-up or nail polish.  Do not wear lotions, powders, or perfumes. You may wear deodorant.  Do not shave 48 hours prior to surgery.  Do not bring valuables to the hospital.  Contacts, dentures or bridgework may not be worn into surgery.  Leave suitcase in the car. After surgery it may be brought to your room.  For patients admitted to the hospital, checkout time is 11:00 AM the day of discharge.   Patients discharged the day of surgery will not be allowed to drive home.  Name and phone number of your driver: NA  Special Instructions: CHG Shower Use Special Wash: 1/2 bottle night before surgery and 1/2 bottle morning of surgery.   Please read over the following fact sheets that you were given: MRSA Information

## 2012-02-13 ENCOUNTER — Encounter (HOSPITAL_COMMUNITY): Payer: Self-pay | Admitting: Anesthesiology

## 2012-02-13 ENCOUNTER — Inpatient Hospital Stay (HOSPITAL_COMMUNITY): Payer: BC Managed Care – PPO | Admitting: Anesthesiology

## 2012-02-13 ENCOUNTER — Encounter (HOSPITAL_COMMUNITY)
Admission: RE | Disposition: A | Discharge status: Home / Self Care | Payer: Self-pay | Source: Ambulatory Visit | Attending: Obstetrics and Gynecology

## 2012-02-13 ENCOUNTER — Other Ambulatory Visit: Payer: Self-pay | Admitting: Obstetrics and Gynecology

## 2012-02-13 ENCOUNTER — Inpatient Hospital Stay (HOSPITAL_COMMUNITY)
Admission: RE | Admit: 2012-02-13 | Discharge: 2012-02-17 | DRG: 359 | Disposition: A | Discharge status: Home / Self Care | Payer: BC Managed Care – PPO | Source: Ambulatory Visit | Attending: Obstetrics and Gynecology | Admitting: Obstetrics and Gynecology

## 2012-02-13 ENCOUNTER — Encounter (HOSPITAL_COMMUNITY): Payer: Self-pay

## 2012-02-13 ENCOUNTER — Encounter (HOSPITAL_COMMUNITY): Payer: Self-pay | Admitting: *Deleted

## 2012-02-13 DIAGNOSIS — F411 Generalized anxiety disorder: Secondary | ICD-10-CM

## 2012-02-13 DIAGNOSIS — I10 Essential (primary) hypertension: Secondary | ICD-10-CM

## 2012-02-13 DIAGNOSIS — F4001 Agoraphobia with panic disorder: Secondary | ICD-10-CM

## 2012-02-13 DIAGNOSIS — F429 Obsessive-compulsive disorder, unspecified: Secondary | ICD-10-CM

## 2012-02-13 DIAGNOSIS — M1711 Unilateral primary osteoarthritis, right knee: Secondary | ICD-10-CM

## 2012-02-13 DIAGNOSIS — F32A Depression, unspecified: Secondary | ICD-10-CM

## 2012-02-13 DIAGNOSIS — E785 Hyperlipidemia, unspecified: Secondary | ICD-10-CM

## 2012-02-13 DIAGNOSIS — D649 Anemia, unspecified: Secondary | ICD-10-CM

## 2012-02-13 DIAGNOSIS — N186 End stage renal disease: Secondary | ICD-10-CM

## 2012-02-13 DIAGNOSIS — N8000 Endometriosis of the uterus, unspecified: Secondary | ICD-10-CM | POA: Diagnosis present

## 2012-02-13 DIAGNOSIS — K5090 Crohn's disease, unspecified, without complications: Secondary | ICD-10-CM

## 2012-02-13 DIAGNOSIS — N840 Polyp of corpus uteri: Secondary | ICD-10-CM | POA: Diagnosis present

## 2012-02-13 DIAGNOSIS — Z940 Kidney transplant status: Secondary | ICD-10-CM

## 2012-02-13 DIAGNOSIS — E119 Type 2 diabetes mellitus without complications: Secondary | ICD-10-CM | POA: Diagnosis present

## 2012-02-13 DIAGNOSIS — Z01812 Encounter for preprocedural laboratory examination: Secondary | ICD-10-CM

## 2012-02-13 DIAGNOSIS — IMO0002 Reserved for concepts with insufficient information to code with codable children: Secondary | ICD-10-CM

## 2012-02-13 DIAGNOSIS — G4733 Obstructive sleep apnea (adult) (pediatric): Secondary | ICD-10-CM

## 2012-02-13 DIAGNOSIS — Z01818 Encounter for other preprocedural examination: Secondary | ICD-10-CM

## 2012-02-13 DIAGNOSIS — N838 Other noninflammatory disorders of ovary, fallopian tube and broad ligament: Secondary | ICD-10-CM | POA: Diagnosis present

## 2012-02-13 DIAGNOSIS — F329 Major depressive disorder, single episode, unspecified: Secondary | ICD-10-CM

## 2012-02-13 DIAGNOSIS — N920 Excessive and frequent menstruation with regular cycle: Principal | ICD-10-CM | POA: Diagnosis present

## 2012-02-13 DIAGNOSIS — M545 Low back pain, unspecified: Secondary | ICD-10-CM

## 2012-02-13 HISTORY — PX: CYSTOSCOPY: SHX5120

## 2012-02-13 HISTORY — PX: ABDOMINAL HYSTERECTOMY: SHX81

## 2012-02-13 LAB — GLUCOSE, CAPILLARY
Glucose-Capillary: 121 mg/dL — ABNORMAL HIGH (ref 70–99)
Glucose-Capillary: 198 mg/dL — ABNORMAL HIGH (ref 70–99)
Glucose-Capillary: 212 mg/dL — ABNORMAL HIGH (ref 70–99)
Glucose-Capillary: 164 mg/dL — ABNORMAL HIGH (ref 70–99)
Glucose-Capillary: 121 mg/dL — ABNORMAL HIGH (ref 70–99)

## 2012-02-13 LAB — PREGNANCY, URINE: Preg Test, Ur: NEGATIVE

## 2012-02-13 SURGERY — HYSTERECTOMY, ABDOMINAL
Anesthesia: General | Site: Bladder | Laterality: Right | Wound class: Clean

## 2012-02-13 MED ORDER — ONDANSETRON HCL 4 MG/2ML IJ SOLN
INTRAMUSCULAR | Status: DC | PRN
  Administered 2012-02-13: 4 mg via INTRAVENOUS

## 2012-02-13 MED ORDER — 0.9 % SODIUM CHLORIDE (POUR BTL) OPTIME
TOPICAL | Status: DC | PRN
  Administered 2012-02-13 (×2): 1000 mL

## 2012-02-13 MED ORDER — LACTATED RINGERS IV SOLN
INTRAVENOUS | Status: DC
  Administered 2012-02-13 – 2012-02-14 (×2): via INTRAVENOUS

## 2012-02-13 MED ORDER — PRENATAL MULTIVITAMIN CH
1.0000 | ORAL_TABLET | Freq: Every day | ORAL | Status: DC
  Administered 2012-02-14 – 2012-02-17 (×4): 1 via ORAL
  Filled 2012-02-13 (×4): qty 1

## 2012-02-13 MED ORDER — PROMETHAZINE HCL 25 MG/ML IJ SOLN: 12.5000 mg | Freq: Once | INTRAMUSCULAR | Status: DC

## 2012-02-13 MED ORDER — SODIUM CHLORIDE 0.9 % IJ SOLN: 9.0000 mL | INTRAMUSCULAR | Status: DC | PRN

## 2012-02-13 MED ORDER — INDIGOTINDISULFONATE SODIUM 8 MG/ML IJ SOLN: 40.0000 mg | Freq: Once | INTRAMUSCULAR | Status: DC

## 2012-02-13 MED ORDER — OCTREOTIDE ACETATE 200 MCG/ML IJ SOLN: 200.0000 ug | Freq: Two times a day (BID) | INTRAMUSCULAR | Status: DC

## 2012-02-13 MED ORDER — INDIGOTINDISULFONATE SODIUM 8 MG/ML IJ SOLN
5.0000 mL | Freq: Once | INTRAMUSCULAR | Status: AC
  Administered 2012-02-13: 5 mL via INTRAVENOUS

## 2012-02-13 MED ORDER — LIDOCAINE HCL 2 % EX GEL
CUTANEOUS | Status: AC
  Filled 2012-02-13: qty 30

## 2012-02-13 MED ORDER — MORPHINE SULFATE (PF) 1 MG/ML IV SOLN: INTRAVENOUS | Status: DC

## 2012-02-13 MED ORDER — NALOXONE HCL 0.4 MG/ML IJ SOLN: 0.4000 mg | INTRAMUSCULAR | Status: DC | PRN

## 2012-02-13 MED ORDER — FENTANYL CITRATE 0.05 MG/ML IJ SOLN
INTRAMUSCULAR | Status: AC
  Administered 2012-02-13: 50 ug via INTRAVENOUS
  Filled 2012-02-13: qty 2

## 2012-02-13 MED ORDER — SERTRALINE HCL 100 MG PO TABS
200.0000 mg | ORAL_TABLET | ORAL | Status: DC
  Administered 2012-02-14 – 2012-02-17 (×4): 200 mg via ORAL
  Filled 2012-02-13 (×4): qty 2

## 2012-02-13 MED ORDER — LIDOCAINE HCL (CARDIAC) 20 MG/ML IV SOLN
INTRAVENOUS | Status: AC
  Filled 2012-02-13: qty 5

## 2012-02-13 MED ORDER — HYDROXYZINE HCL 25 MG PO TABS
25.0000 mg | ORAL_TABLET | Freq: Once | ORAL | Status: AC
  Administered 2012-02-13: 25 mg via ORAL
  Filled 2012-02-13: qty 1

## 2012-02-13 MED ORDER — INSULIN GLARGINE 100 UNIT/ML ~~LOC~~ SOLN
10.0000 [IU] | Freq: Every day | SUBCUTANEOUS | Status: DC
  Administered 2012-02-13 – 2012-02-16 (×4): 10 [IU] via SUBCUTANEOUS
  Filled 2012-02-13: qty 3

## 2012-02-13 MED ORDER — SODIUM BICARBONATE 650 MG PO TABS
650.0000 mg | ORAL_TABLET | Freq: Three times a day (TID) | ORAL | Status: DC
  Administered 2012-02-13 – 2012-02-17 (×9): 650 mg via ORAL
  Filled 2012-02-13 (×12): qty 1

## 2012-02-13 MED ORDER — CEFAZOLIN SODIUM 1-5 GM-% IV SOLN
INTRAVENOUS | Status: AC
  Administered 2012-02-13 (×2): 1 g via INTRAVENOUS
  Filled 2012-02-13: qty 50

## 2012-02-13 MED ORDER — INSULIN ASPART 100 UNIT/ML ~~LOC~~ SOLN: 0.0000 [IU] | Freq: Every day | SUBCUTANEOUS | Status: DC

## 2012-02-13 MED ORDER — PROPOFOL 10 MG/ML IV EMUL
INTRAVENOUS | Status: AC
  Filled 2012-02-13: qty 20

## 2012-02-13 MED ORDER — ALBUTEROL SULFATE HFA 108 (90 BASE) MCG/ACT IN AERS: 2.0000 | INHALATION_SPRAY | Freq: Four times a day (QID) | RESPIRATORY_TRACT | Status: DC | PRN

## 2012-02-13 MED ORDER — GLIPIZIDE 5 MG PO TABS
5.0000 mg | ORAL_TABLET | Freq: Every day | ORAL | Status: DC
  Administered 2012-02-14: 09:00:00 via ORAL
  Administered 2012-02-15 – 2012-02-17 (×3): 5 mg via ORAL
  Filled 2012-02-13 (×4): qty 1

## 2012-02-13 MED ORDER — PREDNISONE 10 MG PO TABS
10.0000 mg | ORAL_TABLET | Freq: Every day | ORAL | Status: DC
  Administered 2012-02-14 – 2012-02-17 (×4): 10 mg via ORAL
  Filled 2012-02-13 (×4): qty 1

## 2012-02-13 MED ORDER — GLIPIZIDE 2.5 MG HALF TABLET
2.5000 mg | ORAL_TABLET | Freq: Every day | ORAL | Status: DC
  Administered 2012-02-13 – 2012-02-16 (×3): 2.5 mg via ORAL
  Filled 2012-02-13 (×4): qty 1

## 2012-02-13 MED ORDER — PROMETHAZINE HCL 25 MG/ML IJ SOLN
INTRAMUSCULAR | Status: AC
  Administered 2012-02-13: 12.5 mg via INTRAVENOUS
  Filled 2012-02-13: qty 1

## 2012-02-13 MED ORDER — CALCITRIOL 0.5 MCG PO CAPS
1.0000 ug | ORAL_CAPSULE | Freq: Every day | ORAL | Status: DC
  Administered 2012-02-14 – 2012-02-17 (×4): 1 ug via ORAL
  Filled 2012-02-13 (×4): qty 2

## 2012-02-13 MED ORDER — BUPIVACAINE HCL (PF) 0.25 % IJ SOLN
INTRAMUSCULAR | Status: AC
  Filled 2012-02-13: qty 30

## 2012-02-13 MED ORDER — FENTANYL CITRATE 0.05 MG/ML IJ SOLN
INTRAMUSCULAR | Status: AC
  Filled 2012-02-13: qty 4

## 2012-02-13 MED ORDER — ALPRAZOLAM 0.5 MG PO TABS: 1.0000 mg | ORAL_TABLET | Freq: Three times a day (TID) | ORAL | Status: DC | PRN

## 2012-02-13 MED ORDER — HYDROXYZINE HCL 50 MG PO TABS
50.0000 mg | ORAL_TABLET | Freq: Every day | ORAL | Status: DC
  Administered 2012-02-14 – 2012-02-16 (×3): 50 mg via ORAL
  Filled 2012-02-13 (×4): qty 1

## 2012-02-13 MED ORDER — ONDANSETRON HCL 4 MG/2ML IJ SOLN: 4.0000 mg | Freq: Once | INTRAMUSCULAR | Status: DC

## 2012-02-13 MED ORDER — NEOSTIGMINE METHYLSULFATE 1 MG/ML IJ SOLN
INTRAMUSCULAR | Status: DC | PRN
  Administered 2012-02-13: 5 mg via INTRAVENOUS

## 2012-02-13 MED ORDER — MYCOPHENOLATE SODIUM 180 MG PO TBEC
360.0000 mg | DELAYED_RELEASE_TABLET | Freq: Two times a day (BID) | ORAL | Status: DC
  Administered 2012-02-13 – 2012-02-17 (×8): 360 mg via ORAL
  Filled 2012-02-13 (×9): qty 2

## 2012-02-13 MED ORDER — TIOTROPIUM BROMIDE MONOHYDRATE 18 MCG IN CAPS
18.0000 ug | ORAL_CAPSULE | RESPIRATORY_TRACT | Status: DC
  Administered 2012-02-14 – 2012-02-15 (×2): 18 ug via RESPIRATORY_TRACT
  Filled 2012-02-13: qty 5

## 2012-02-13 MED ORDER — TEMAZEPAM 15 MG PO CAPS: 15.0000 mg | ORAL_CAPSULE | Freq: Every evening | ORAL | Status: DC | PRN

## 2012-02-13 MED ORDER — FENTANYL CITRATE 0.05 MG/ML IJ SOLN
25.0000 ug | INTRAMUSCULAR | Status: DC | PRN
  Administered 2012-02-13 (×2): 50 ug via INTRAVENOUS

## 2012-02-13 MED ORDER — ONDANSETRON 8 MG PO TBDP
8.0000 mg | ORAL_TABLET | Freq: Three times a day (TID) | ORAL | Status: DC
  Administered 2012-02-14 – 2012-02-16 (×5): 8 mg via ORAL
  Filled 2012-02-13 (×12): qty 1

## 2012-02-13 MED ORDER — CYCLOBENZAPRINE HCL 10 MG PO TABS
10.0000 mg | ORAL_TABLET | Freq: Every day | ORAL | Status: DC
  Administered 2012-02-14 – 2012-02-16 (×3): 10 mg via ORAL
  Filled 2012-02-13 (×4): qty 1

## 2012-02-13 MED ORDER — LACTATED RINGERS IV SOLN: INTRAVENOUS | Status: DC

## 2012-02-13 MED ORDER — ONDANSETRON HCL 4 MG/2ML IJ SOLN: 4.0000 mg | Freq: Four times a day (QID) | INTRAMUSCULAR | Status: DC | PRN

## 2012-02-13 MED ORDER — TRAMADOL HCL 50 MG PO TABS
50.0000 mg | ORAL_TABLET | Freq: Four times a day (QID) | ORAL | Status: DC | PRN
  Administered 2012-02-14: 50 mg via ORAL
  Filled 2012-02-13: qty 1

## 2012-02-13 MED ORDER — PROMETHAZINE HCL 25 MG/ML IJ SOLN: 25.0000 mg | Freq: Four times a day (QID) | INTRAMUSCULAR | Status: DC | PRN

## 2012-02-13 MED ORDER — DIPHENHYDRAMINE HCL 12.5 MG/5ML PO ELIX: 12.5000 mg | ORAL_SOLUTION | Freq: Four times a day (QID) | ORAL | Status: DC | PRN

## 2012-02-13 MED ORDER — FENTANYL CITRATE 0.05 MG/ML IJ SOLN
INTRAMUSCULAR | Status: AC
  Filled 2012-02-13: qty 5

## 2012-02-13 MED ORDER — STERILE WATER FOR IRRIGATION IR SOLN
Status: DC | PRN
  Administered 2012-02-13: 1000 mL

## 2012-02-13 MED ORDER — PROPOFOL 10 MG/ML IV EMUL
INTRAVENOUS | Status: DC | PRN
  Administered 2012-02-13: 200 mg via INTRAVENOUS
  Administered 2012-02-13: 50 mg via INTRAVENOUS

## 2012-02-13 MED ORDER — HYDROMORPHONE 0.3 MG/ML IV SOLN: INTRAVENOUS | Status: DC

## 2012-02-13 MED ORDER — PANTOPRAZOLE SODIUM 40 MG PO TBEC
40.0000 mg | DELAYED_RELEASE_TABLET | Freq: Every day | ORAL | Status: DC
  Administered 2012-02-14 – 2012-02-16 (×3): 40 mg via ORAL
  Filled 2012-02-13 (×4): qty 1

## 2012-02-13 MED ORDER — FENTANYL CITRATE 0.05 MG/ML IJ SOLN
INTRAMUSCULAR | Status: DC | PRN
  Administered 2012-02-13: 50 ug via INTRAVENOUS
  Administered 2012-02-13: 100 ug via INTRAVENOUS
  Administered 2012-02-13 (×9): 50 ug via INTRAVENOUS
  Administered 2012-02-13: 100 ug via INTRAVENOUS

## 2012-02-13 MED ORDER — CEFAZOLIN SODIUM 1-5 GM-% IV SOLN: 1.0000 g | INTRAVENOUS | Status: DC

## 2012-02-13 MED ORDER — INSULIN ASPART 100 UNIT/ML ~~LOC~~ SOLN
6.0000 [IU] | Freq: Three times a day (TID) | SUBCUTANEOUS | Status: DC
  Administered 2012-02-14 – 2012-02-17 (×7): 6 [IU] via SUBCUTANEOUS

## 2012-02-13 MED ORDER — PROMETHAZINE HCL 25 MG/ML IJ SOLN
12.5000 mg | INTRAMUSCULAR | Status: DC | PRN
  Administered 2012-02-13 – 2012-02-15 (×4): 12.5 mg via INTRAVENOUS
  Filled 2012-02-13 (×3): qty 1

## 2012-02-13 MED ORDER — GLYCOPYRROLATE 0.2 MG/ML IJ SOLN
INTRAMUSCULAR | Status: DC | PRN
  Administered 2012-02-13: 1 mg via INTRAVENOUS
  Administered 2012-02-13: 0.1 mg via INTRAVENOUS

## 2012-02-13 MED ORDER — MIDAZOLAM HCL 5 MG/5ML IJ SOLN
INTRAMUSCULAR | Status: DC | PRN
  Administered 2012-02-13: 2 mg via INTRAVENOUS

## 2012-02-13 MED ORDER — DIPHENHYDRAMINE HCL 50 MG/ML IJ SOLN: 12.5000 mg | Freq: Four times a day (QID) | INTRAMUSCULAR | Status: DC | PRN

## 2012-02-13 MED ORDER — CEFAZOLIN SODIUM 1-5 GM-% IV SOLN
INTRAVENOUS | Status: AC
  Filled 2012-02-13: qty 50

## 2012-02-13 MED ORDER — ONDANSETRON HCL 4 MG/2ML IJ SOLN
INTRAMUSCULAR | Status: AC
  Administered 2012-02-13: 4 mg via INTRAVENOUS
  Filled 2012-02-13: qty 2

## 2012-02-13 MED ORDER — ONDANSETRON HCL 4 MG/2ML IJ SOLN
INTRAMUSCULAR | Status: AC
  Filled 2012-02-13: qty 2

## 2012-02-13 MED ORDER — LACTATED RINGERS IV SOLN
INTRAVENOUS | Status: DC
  Administered 2012-02-13 (×3): via INTRAVENOUS

## 2012-02-13 MED ORDER — INDIGOTINDISULFONATE SODIUM 8 MG/ML IJ SOLN
INTRAMUSCULAR | Status: AC
  Filled 2012-02-13: qty 5

## 2012-02-13 MED ORDER — BUSPIRONE HCL 15 MG PO TABS
15.0000 mg | ORAL_TABLET | Freq: Three times a day (TID) | ORAL | Status: DC
  Administered 2012-02-13 – 2012-02-17 (×10): 15 mg via ORAL
  Filled 2012-02-13 (×12): qty 1

## 2012-02-13 MED ORDER — LIDOCAINE HCL (CARDIAC) 20 MG/ML IV SOLN
INTRAVENOUS | Status: DC | PRN
  Administered 2012-02-13: 80 mg via INTRAVENOUS

## 2012-02-13 MED ORDER — ONDANSETRON HCL 4 MG/2ML IJ SOLN
4.0000 mg | Freq: Four times a day (QID) | INTRAMUSCULAR | Status: DC | PRN
  Administered 2012-02-13: 4 mg via INTRAVENOUS

## 2012-02-13 MED ORDER — MESALAMINE 1.2 G PO TBEC
2.4000 g | DELAYED_RELEASE_TABLET | Freq: Three times a day (TID) | ORAL | Status: DC
  Administered 2012-02-13 – 2012-02-17 (×6): 2.4 g via ORAL
  Filled 2012-02-13 (×12): qty 2

## 2012-02-13 MED ORDER — ROCURONIUM BROMIDE 50 MG/5ML IV SOLN
INTRAVENOUS | Status: AC
  Filled 2012-02-13: qty 1

## 2012-02-13 MED ORDER — GLYCOPYRROLATE 0.2 MG/ML IJ SOLN
INTRAMUSCULAR | Status: AC
  Filled 2012-02-13: qty 2

## 2012-02-13 MED ORDER — INSULIN ASPART 100 UNIT/ML ~~LOC~~ SOLN
0.0000 [IU] | Freq: Three times a day (TID) | SUBCUTANEOUS | Status: DC
Start: 2012-02-13 — End: 2012-02-17
  Administered 2012-02-13: 4 [IU] via SUBCUTANEOUS
  Administered 2012-02-14: 7 [IU] via SUBCUTANEOUS
  Administered 2012-02-14: 3 [IU] via SUBCUTANEOUS
  Administered 2012-02-14: 4 [IU] via SUBCUTANEOUS
  Administered 2012-02-15: 13 [IU] via SUBCUTANEOUS
  Administered 2012-02-16: 7 [IU] via SUBCUTANEOUS
  Administered 2012-02-16: 3 [IU] via SUBCUTANEOUS
  Filled 2012-02-13: qty 3

## 2012-02-13 MED ORDER — ROCURONIUM BROMIDE 100 MG/10ML IV SOLN
INTRAVENOUS | Status: DC | PRN
  Administered 2012-02-13 (×2): 10 mg via INTRAVENOUS
  Administered 2012-02-13: 50 mg via INTRAVENOUS

## 2012-02-13 MED ORDER — MORPHINE SULFATE (PF) 1 MG/ML IV SOLN
INTRAVENOUS | Status: DC
  Administered 2012-02-13: 26 mg via INTRAVENOUS
  Administered 2012-02-13 (×2): via INTRAVENOUS
  Administered 2012-02-13: 14 mL via INTRAVENOUS
  Administered 2012-02-14: 13.92 mg via INTRAVENOUS
  Administered 2012-02-14: 6 mg via INTRAVENOUS
  Administered 2012-02-14: 06:00:00 via INTRAVENOUS
  Filled 2012-02-13 (×3): qty 25

## 2012-02-13 MED ORDER — TACROLIMUS 1 MG PO CAPS
6.0000 mg | ORAL_CAPSULE | Freq: Two times a day (BID) | ORAL | Status: DC
  Administered 2012-02-13 – 2012-02-17 (×8): 6 mg via ORAL
  Filled 2012-02-13 (×9): qty 6

## 2012-02-13 MED ORDER — MIDAZOLAM HCL 2 MG/2ML IJ SOLN
INTRAMUSCULAR | Status: AC
  Filled 2012-02-13: qty 2

## 2012-02-13 MED ORDER — MENTHOL 3 MG MT LOZG
1.0000 | LOZENGE | OROMUCOSAL | Status: DC | PRN
  Filled 2012-02-13: qty 9

## 2012-02-13 MED ORDER — NEOSTIGMINE METHYLSULFATE 1 MG/ML IJ SOLN
INTRAMUSCULAR | Status: AC
  Filled 2012-02-13: qty 10

## 2012-02-13 SURGICAL SUPPLY — 55 items
ADAPTER GOLDBERG URETERAL (ADAPTER) ×2 IMPLANT
ADH SKN CLS APL DERMABOND .7 (GAUZE/BANDAGES/DRESSINGS) ×4
ADPR CATH 15X14FR FL DRN BG (ADAPTER) ×4
BAG DRAIN URO-CYSTO SKYTR STRL (DRAIN) ×2 IMPLANT
BAG DRN UROCATH (DRAIN) ×4
BRR ADH 6X5 SEPRAFILM 1 SHT (MISCELLANEOUS)
CANISTER SUCTION 2500CC (MISCELLANEOUS) ×5 IMPLANT
CATH INTERMIT  6FR 70CM (CATHETERS) ×4 IMPLANT
CHLORAPREP W/TINT 26ML (MISCELLANEOUS) ×5 IMPLANT
CLOTH BEACON ORANGE TIMEOUT ST (SAFETY) ×5 IMPLANT
DECANTER SPIKE VIAL GLASS SM (MISCELLANEOUS) IMPLANT
DERMABOND ADVANCED (GAUZE/BANDAGES/DRESSINGS) ×1
DERMABOND ADVANCED .7 DNX12 (GAUZE/BANDAGES/DRESSINGS) ×4 IMPLANT
DRAPE HYSTEROSCOPY (DRAPE) ×2 IMPLANT
DRAPE PROXIMA HALF (DRAPES) ×2 IMPLANT
GAUZE SPONGE 4X4 16PLY XRAY LF (GAUZE/BANDAGES/DRESSINGS) ×4 IMPLANT
GLIDEWIRE ×2 IMPLANT
GLOVE BIO SURGEON STRL SZ 6.5 (GLOVE) ×10 IMPLANT
GLOVE BIO SURGEON STRL SZ7.5 (GLOVE) ×2 IMPLANT
GLOVE BIOGEL M 8.0 STRL (GLOVE) ×6 IMPLANT
GLOVE BIOGEL PI IND STRL 8.5 (GLOVE) ×4 IMPLANT
GLOVE BIOGEL PI INDICATOR 8.5 (GLOVE) ×4
GOLDBERG  URETERAL ADAPTOR ×2 IMPLANT
GOWN PREVENTION PLUS LG XLONG (DISPOSABLE) ×19 IMPLANT
GUIDEWIRE COONS BENTSON MOVE (WIRE) ×2 IMPLANT
LEGGING LITHOTOMY PAIR STRL (DRAPES) ×2 IMPLANT
NDL HYPO 25X1 1.5 SAFETY (NEEDLE) IMPLANT
NEEDLE HYPO 22GX1.5 SAFETY (NEEDLE) ×5 IMPLANT
NEEDLE HYPO 25X1 1.5 SAFETY (NEEDLE) IMPLANT
NS IRRIG 1000ML POUR BTL (IV SOLUTION) ×5 IMPLANT
PACK ABDOMINAL GYN (CUSTOM PROCEDURE TRAY) ×5 IMPLANT
PAD OB MATERNITY 4.3X12.25 (PERSONAL CARE ITEMS) ×5 IMPLANT
PROTECTOR NERVE ULNAR (MISCELLANEOUS) ×5 IMPLANT
SEPRAFILM MEMBRANE 5X6 (MISCELLANEOUS) IMPLANT
SHEET LAVH (DRAPES) ×2 IMPLANT
SPONGE LAP 18X18 X RAY DECT (DISPOSABLE) ×10 IMPLANT
STAPLER VISISTAT 35W (STAPLE) IMPLANT
SUT PDS AB 0 CT 36 (SUTURE) IMPLANT
SUT PDS AB 0 CTX 60 (SUTURE) IMPLANT
SUT PLAIN 2 0 XLH (SUTURE) IMPLANT
SUT SILK 2 0 TIES 17X18 (SUTURE) ×5
SUT SILK 2-0 18XBRD TIE BLK (SUTURE) ×1 IMPLANT
SUT VIC AB 0 CT1 18XCR BRD8 (SUTURE) ×9 IMPLANT
SUT VIC AB 0 CT1 27 (SUTURE) ×20
SUT VIC AB 0 CT1 27XBRD ANBCTR (SUTURE) ×16 IMPLANT
SUT VIC AB 0 CT1 8-18 (SUTURE) ×15
SUT VIC AB 3-0 PS1 18 (SUTURE)
SUT VIC AB 3-0 PS1 18X BRD (SUTURE) IMPLANT
SUT VIC AB 4-0 KS 27 (SUTURE) ×5 IMPLANT
SUT VICRYL 0 TIES 12 18 (SUTURE) ×5 IMPLANT
SYR CONTROL 10ML LL (SYRINGE) IMPLANT
SYRINGE 10CC LL (SYRINGE) ×5 IMPLANT
TOWEL OR 17X24 6PK STRL BLUE (TOWEL DISPOSABLE) ×10 IMPLANT
TRAY FOLEY CATH 14FR (SET/KITS/TRAYS/PACK) ×5 IMPLANT
WATER STERILE IRR 1000ML POUR (IV SOLUTION) ×5 IMPLANT

## 2012-02-13 NOTE — Addendum Note (Signed)
Addendum  created 02/13/12 1723 by Willa Rough, CRNA   Modules edited:Notes Section

## 2012-02-13 NOTE — H&P (Signed)
36 year old female presents for Total Abdominal hysterectomy. This patient has abnormal uterine bleeding - she has bled almost daily since October. She has had a history of uterine endometrial ablation.   Medical History: 1. Chronic Renal Disease 2. Arthritis 3. Diabetes Mellitus 4. IBS 5. Migraine  Surgical History: 1. Lumpectomy 2. Above 3. Right nephrectomy for localized right carcinoma in 1990 3. Parathyroidectomy 4. Knee Surgery 5. History of renal transplant x 2 First one over 15 years ago and placed in left iliac fossa Second one in 2012 placed in right iliac fossa 6. BTL  Meds: Buspar 15 mg  2. Calcitriol 0.25 mg  3. Diphenoxylate-Atropine 4. Epogen 5. Glucotrol XL 2.5 mg po qd 6. Hydroxyzine 25  7.Lantus 100 units 8. Lialda 1.2 gram  9. Myfortic 180 mg 10. Norvas 5 mg 11. Novolog 100 units 12. Omprazole 40 mg 13. Ondansetron 5 mg 14. Phisohex 3% 15. Prednisone 5 mg 16. Promethazine  17. Sertraline 19. Sodiaum Bicarbonate 650 mg  20. Spiriva inhaler 21. Tracrolimus 1 mg 22. Ventolin 23. Xanax 86m   Allergies: Hydromorphone Iopamidol  Iron - Dextran Vancomycin Barium Shellfish Constrast Dye Tape  Family History Positive for arthritis diabetes heart disease  Social History No tobacco Married  Review of systems Positive for nocturia, menorrhagia, nausea, depression  237 pound BP 122/72 General alert and oriented Lung CTAB Car RRR Abdomen is soft and non tender Multiple surgical scars Gyn external is normal Vagina is normal Cervix is deep in the vagina Uterus is small anteverted and non tender  IMPRESSION: Abnormal uterine bleeding  Plan: TAH possible BSO Because of complicated urologic system , Dr Diona Fanti will be present for the surgery to possibly place guidewires in the ureters and assist to avoid potential injury to kidneys or ureters Risks already discussed with the patient

## 2012-02-13 NOTE — Anesthesia Postprocedure Evaluation (Signed)
  Anesthesia Post-op Note  Patient: [name]  Procedure(s) Performed: Procedure(s) (LRB): HYSTERECTOMY ABDOMINAL (N/A) CYSTOSCOPY (N/A) UNILATERAL SALPINGECTOMY (Right)  Patient Location: PACU and Women's Unit  Anesthesia Type: General  Level of Consciousness: awake, alert  and oriented  Airway and Oxygen Therapy: Patient Spontanous Breathing and Patient connected to nasal cannula oxygen  Post-op Pain: none  Post-op Assessment: Post-op Vital signs reviewed and Patient's Cardiovascular Status Stable  Post-op Vital Signs: Reviewed and stable  Complications: No apparent anesthesia complications

## 2012-02-13 NOTE — OR Nursing (Signed)
Dr Diona Fanti completed his part of procedure at [address] [facility]  Made her incision at [place]

## 2012-02-13 NOTE — Op Note (Signed)
[name], [name]              ACCOUNT NO.:  [id number]  MEDICAL RECORD NO.:  [id number]  LOCATION:  [facility]                          FACILITY:  [facility]  PHYSICIAN:  Orianna Biskup L. Thia Olesen, M.D.DATE OF BIRTH:  18-Mar-1976  DATE OF PROCEDURE: DATE OF DISCHARGE:                              OPERATIVE REPORT   PREOPERATIVE DIAGNOSES: 1. Menorrhagia. 2. History of renal transplant x2.  POSTOPERATIVE DIAGNOSES: 1. Menorrhagia. 2. History of renal transplant x2.  PROCEDURES: 1. Cystoscopy by Dr. Diona Fanti, that will be dictated by him. 2. Total abdominal hysterectomy and right salpingectomy.  SURGEONS:  Royalty Fakhouri L. Helane Rima, MD and Lillette Boxer. Dahlstedt, MD.  ASSISTANT:  Dr. Nori Riis.  ANESTHESIA:  General.  EBL:  Less than 100 mL.  COMPLICATIONS:  None.  PROCEDURE IN DETAIL:  The patient was taken to the operating room.  She was intubated.  She was prepped and draped initially by Dr. Diona Fanti and his procedure was performed.  At the conclusion of that procedure, I then re-prepped the patient and performed a time out.  A low-transverse incision was made, carried down to the fascia.  Fascia was scored in the midline and extended laterally.  Rectus muscles were separated in the midline.  The peritoneum was identified and the peritoneal incision was then stretched.  There were some omental adhesions, but they were above our surgical field.  The self-retaining O'Connor-O'Sullivan retractor was placed in the upper abdomen.  Small and large bowel placed in the upper abdomen.  We could palpate both the kidney and the right iliac fossa and one in the left iliac fossa.  The uterus was very small. There was a right hydrosalpinx.  Ovaries were unremarkable.  We elevated the uterus using Kelly clamps and identified the round ligament and each ligament was suture ligated using 0 Vicryl suture.  We then placed curved Heaney clamps across each triple pedicle, each pedicle was cut. After clamping,  suture ligated using 0 Vicryl suture.  We developed our bladder flap easily.  We then placed curved Heaney clamps across the uterine artery at the level of the internal os.  Each pedicle was clamped, cut, and suture ligated using 0 Vicryl suture.  After developing the bladder flap which was easily done, we then walked our way easily by staying very snug beside the cervix and using straight Heaney clamps.  Each pedicle was clamped, cut, and suture ligated using 0 Vicryl suture.  We then placed curved Heaney clamps beneath the external os, staying snug against the cervix.  The specimen was removed and  identified as uterus and cervix.  We then created out angle stitches by placing angle stitches using 0 Vicryl suture and closed the rest of the cuff in figure-of-eight using 0 Vicryl suture.  At this point, I elected to go ahead and remove the right fallopian tube because of the hydrosalpinx and that was easily done by elevating the tube, placed a curved Heaney clamp beneath it, and not removing that ovary. The specimen was removed, identified as hydrosalpinx and right tube. The pedicle was secured using a suture ligature of 0 Vicryl suture. Irrigation was performed.  Hemostasis was noted everywhere.  The ovaries appeared unremarkable.  Of  note, the left kidney while it was extraperitoneal was very close to the left ovary.  The patient needs to have a left salpingo-oophorectomy in the future.  She is most likely going to need a stents placed intraoperatively prior to the removal. All sponges and instruments were removed from the abdominal cavity.  The peritoneum, rectus muscles were approximated using 0 Vicryl.  The fascia was closed using 0 Vicryl and running stitch starting each corner and meeting in the midline.  After irrigation of subcutaneous layer, the subcu layer was closed with plain, interrupted and the skin was closed with 4-0 Vicryl in a running stitch.  Dermabond was applied.   All sponge, lap, and instrument counts were correct x2.  The patient was extubated and went to recovery room in stable condition.     Dalbert Stillings L. Helane Rima, M.D.     Nevin Bloodgood  D:  02/13/2012  T:  02/13/2012  Job:  [id number]  cc:   Lillette Boxer. Dahlstedt, M.D. Fax: [phone]  Dr. Eddie Dibbles

## 2012-02-13 NOTE — Brief Op Note (Signed)
02/13/2012  10:43 AM  PATIENT:  [name]  36 y.o. female  PRE-OPERATIVE DIAGNOSIS:  Menorrhagia, history of renal transplant x 2  POST-OPERATIVE DIAGNOSIS:  Menorrghia, history of renal transplant x 2  PROCEDURE:  Procedure(s) (LRB): HYSTERECTOMY ABDOMINAL (N/A) CYSTOSCOPY (N/A) UNILATERAL SALPINGECTOMY (Right)  SURGEON:  Surgeon(s) and Role: Panel 1:    * Cyril Mourning, MD - Primary    * W Evette Cristal, MD - Assisting  Panel 2:    * Franchot Gallo, MD - Primary  PHYSICIAN ASSISTANT:   ASSISTANTS: none   ANESTHESIA:   general  EBL:  Total I/O In: 1000 [I.V.:1000] Out: 650 [Urine:400; Blood:250]  BLOOD ADMINISTERED:none  DRAINS: Urinary Catheter (Foley)   LOCAL MEDICATIONS USED:  NONE  SPECIMEN:  Source of Specimen:  uterus, cervix, right tube  DISPOSITION OF SPECIMEN:  PATHOLOGY  COUNTS:  YES  TOURNIQUET:  * No tourniquets in log *  DICTATION: .Other Dictation: Dictation Number [id number]  PLAN OF CARE: Admit to inpatient   PATIENT DISPOSITION:  PACU - hemodynamically stable.   Delay start of Pharmacological VTE agent (>24hrs) due to surgical blood loss or risk of bleeding: yes

## 2012-02-13 NOTE — Progress Notes (Signed)
History and physical on the chart. No significant changes Will proceed with TAH Possible BSO Risks already reviewed with the patient

## 2012-02-13 NOTE — Anesthesia Procedure Notes (Signed)
Procedure Name: Intubation Date/Time: 02/13/2012 8:46 AM Performed by: Katherina Mires Pre-anesthesia Checklist: Patient being monitored, Suction available, Emergency Drugs available, Patient identified and Timeout performed Patient Re-evaluated:Patient Re-evaluated prior to inductionOxygen Delivery Method: Circle System Utilized Preoxygenation: Pre-oxygenation with 100% oxygen Intubation Type: IV induction Ventilation: Mask ventilation without difficulty Grade View: Grade I Tube type: Oral Airway Equipment and Method: patient positioned with wedge pillow,  stylet and video-laryngoscopy Placement Confirmation: ETT inserted through vocal cords under direct vision,  positive ETCO2 and breath sounds checked- equal and bilateral Secured at: 21 cm Tube secured with: Tape Dental Injury: Teeth and Oropharynx as per pre-operative assessment

## 2012-02-13 NOTE — Anesthesia Postprocedure Evaluation (Signed)
  Anesthesia Post-op Note  Patient: [name]  Procedure(s) Performed: Procedure(s) (LRB): HYSTERECTOMY ABDOMINAL (N/A) CYSTOSCOPY (N/A) UNILATERAL SALPINGECTOMY (Right) Patient is awake and responsive. Pain and nausea are reasonably well controlled. Vital signs are stable and clinically acceptable; repeat BS is 190. Oxygen saturation is clinically acceptable. There are no apparent anesthetic complications at this time. Patient is ready for discharge.

## 2012-02-13 NOTE — Anesthesia Preprocedure Evaluation (Addendum)
Anesthesia Evaluation  Patient identified by MRN, date of birth, ID band Patient awake    Reviewed: Allergy & Precautions, H&P , Patient's Chart, lab work & pertinent test results  History of Anesthesia Complications (+) PONV  Airway Mallampati: II TM Distance: >3 FB Neck ROM: full    Dental  (+) Teeth Intact   Pulmonary asthma (Mostly seasonal; spring, breating wel, clear lungs, takes steroids for imunosuppression, not Asthma) ,  clear to auscultation        Cardiovascular regular Normal    Neuro/Psych    GI/Hepatic   Endo/Other  Diabetes mellitus-, Well Controlled, Type 2Morbid obesityWell controlled, am bs 121,   Renal/GU      Musculoskeletal   Abdominal   Peds  Hematology   Anesthesia Other Findings       Reproductive/Obstetrics (+) Pregnancy                          Anesthesia Physical Anesthesia Plan  ASA: III  Anesthesia Plan: General   Post-op Pain Management:    Induction: Intravenous  Airway Management Planned: Oral ETT  Additional Equipment:   Intra-op Plan:   Post-operative Plan:   Informed Consent: I have reviewed the patients History and Physical, chart, labs and discussed the procedure including the risks, benefits and alternatives for the proposed anesthesia with the patient or authorized representative who has indicated his/her understanding and acceptance.   Dental Advisory Given and Dental advisory given  Plan Discussed with: CRNA and Surgeon  Anesthesia Plan Comments: (  Discussed  general anesthesia, including possible nausea, instrumentation of airway, sore throat,pulmonary aspiration, etc. I asked if the were any outstanding questions, or  concerns before we proceeded. )        Anesthesia Quick Evaluation

## 2012-02-13 NOTE — Transfer of Care (Signed)
Immediate Anesthesia Transfer of Care Note  Patient: [name]  Procedure(s) Performed: Procedure(s) (LRB): HYSTERECTOMY ABDOMINAL (N/A) CYSTOSCOPY (N/A) UNILATERAL SALPINGECTOMY (Right)  Patient Location: PACU  Anesthesia Type: General  Level of Consciousness: awake, alert  and oriented  Airway & Oxygen Therapy: Patient Spontanous Breathing and Patient connected to nasal cannula oxygen  Post-op Assessment: Report given to PACU RN and Post -op Vital signs reviewed and stable  Post vital signs: Reviewed and stable  Complications: No apparent anesthesia complications

## 2012-02-13 NOTE — Consults (Signed)
Inpatient Diabetes Program Recommendations  AACE/ADA: New Consensus Statement on Inpatient Glycemic Control (2009)  Target Ranges:  Prepandial:   less than 140 mg/dL      Peak postprandial:   less than 180 mg/dL (1-2 hours)      Critically ill patients:  140 - 180 mg/dL   Reason for Visit: CBG greater than 150 on day of admission Reviewed orders with RN instructing to give correction at times designated and to give meal coverage when/if she eats  a meal.  Will talk with patient tomorrow in am.  Note: Thank you, Rosita Kea, RN, CNS, Diabetes Coordinator [phone])

## 2012-02-14 LAB — GLUCOSE, CAPILLARY
Glucose-Capillary: 150 mg/dL — ABNORMAL HIGH (ref 70–99)
Glucose-Capillary: 190 mg/dL — ABNORMAL HIGH (ref 70–99)
Glucose-Capillary: 134 mg/dL — ABNORMAL HIGH (ref 70–99)
Glucose-Capillary: 214 mg/dL — ABNORMAL HIGH (ref 70–99)
Glucose-Capillary: 149 mg/dL — ABNORMAL HIGH (ref 70–99)

## 2012-02-14 LAB — CBC
HCT: 35.2 % — ABNORMAL LOW (ref 36.0–46.0)
Hemoglobin: 11.1 g/dL — ABNORMAL LOW (ref 12.0–15.0)
MCH: 27.3 pg (ref 26.0–34.0)
MCHC: 31.5 g/dL (ref 30.0–36.0)
MCV: 86.7 fL (ref 78.0–100.0)
Platelets: 265 10*3/uL (ref 150–400)
RBC: 4.06 MIL/uL (ref 3.87–5.11)
RDW: 15.1 % (ref 11.5–15.5)
WBC: 14.4 10*3/uL — ABNORMAL HIGH (ref 4.0–10.5)

## 2012-02-14 LAB — BASIC METABOLIC PANEL
BUN: 14 mg/dL (ref 6–23)
CO2: 24 mEq/L (ref 19–32)
Calcium: 7.4 mg/dL — ABNORMAL LOW (ref 8.4–10.5)
Chloride: 96 mEq/L (ref 96–112)
Creatinine, Ser: 1.46 mg/dL — ABNORMAL HIGH (ref 0.50–1.10)
GFR calc Af Amer: 52 mL/min — ABNORMAL LOW (ref >90)
GFR calc non Af Amer: 45 mL/min — ABNORMAL LOW (ref >90)
Glucose, Bld: 180 mg/dL — ABNORMAL HIGH (ref 70–99)
Potassium: 3.7 mEq/L (ref 3.5–5.1)
Sodium: 130 mEq/L — ABNORMAL LOW (ref 135–145)

## 2012-02-14 MED ORDER — OXYCODONE HCL 5 MG PO TABS
10.0000 mg | ORAL_TABLET | ORAL | Status: DC | PRN
  Administered 2012-02-14 – 2012-02-16 (×5): 10 mg via ORAL
  Filled 2012-02-14 (×2): qty 2
  Filled 2012-02-14 (×2): qty 1
  Filled 2012-02-14 (×2): qty 2

## 2012-02-14 NOTE — Progress Notes (Signed)
Brief Nutrition Note:  Patient requested to speak with RD about her diet options.  Currently on Carb Modified Medium diet.  She is tolerating po intake but with a decreased appetite.  Patient states she has "texture issues" and has had a swallow study in the past due gagging.  She states she sees a dietitian on an outpatient basis.  Reports very little variety in her diet at baseline.  Drowsy and nauseous during my visit.  S/p TAH 2/20.  Patient Active Problem List  Diagnoses  . ESRD (end stage renal disease)  . Kidney transplant status, living unrelated donor  . Anemia  . NIDDM (non-insulin dependent diabetes mellitus)  . Osteoarthritis of right knee  . Lumbago  . HTN (hypertension)  . HLD (hyperlipidemia)  . Generalized anxiety disorder  . Depression  . Chronic steroid use  . Crohn's disease  . OCD (obsessive compulsive disorder)  . Agoraphobia with panic attacks  . Obstructive sleep apnea, adult   Past Medical History  Diagnosis Date  . Asthma   . Chronic anemia   . Panic disorder   . Crohn's disease   . Complication of anesthesia     does not do well with sedation  . Diabetes mellitus   . Anxiety     OCD  . Renal disorder    Past Surgical History  Procedure Date  . Nephrectomy transplanted organ   . Parathyroidectomy   . Tubal ligation   . Knee surgery    Labs, meds, I/Os reviewed.  Plan:  Patient's preferences obtained and noted in [facility].  Recommend liberalizing diet order to Regular to allow more meal choices and so that patient can order less than 75 grams of carbohydrate at each meal.  Delma Post, Lisabeth Devoid

## 2012-02-14 NOTE — Progress Notes (Signed)
UR Chart review completed.  

## 2012-02-14 NOTE — Progress Notes (Signed)
1 Day Post-Op Procedure(s) (LRB): HYSTERECTOMY ABDOMINAL (N/A) CYSTOSCOPY (N/A) UNILATERAL SALPINGECTOMY (Right)  Subjective: Patient reports nausea, incisional pain and tolerating PO.    Objective: I have reviewed patient's vital signs, intake and output, medications and labs.  General: alert and cooperative Abdomen is soft and non tender Incision clean, dry and tender  Assessment: s/p Procedure(s) (LRB): HYSTERECTOMY ABDOMINAL (N/A) CYSTOSCOPY (N/A) UNILATERAL SALPINGECTOMY (Right): stable and progressing well  Plan: Advance diet Encourage ambulation Advance to PO medication Discontinue IV fluids Continue Insulin  LOS: 1 day    Skylar Flynt L 02/14/2012, 7:00 AM

## 2012-02-14 NOTE — Progress Notes (Signed)
Inpatient Diabetes Program Recommendations  AACE/ADA: New Consensus Statement on Inpatient Glycemic Control (2009)  Target Ranges:  Prepandial:   less than 140 mg/dL      Peak postprandial:   less than 180 mg/dL (1-2 hours)      Critically ill patients:  140 - 180 mg/dL   Reason for Visit: Glucose control Appreciate RD's concern regarding po intake, however diet orders for carb modified moderate level of 60 grams is typically best for any med-surg patient.  Would not want to change her diet to regular, as the carb intake would change significantly.  Would cover patient with 1 unit per 10 grams carbohydrate intake rather than change her diet (even if eating poorly).  Pt would not benefit from a Regular diet by eating more carbohydrate.  Pt is use to counting carbohydrates and using By[place] twice a day.  Pt can bring in own Byetta to use while here but if nauseated, would not recommend that she use while here.  Note: Thank you, Rosita Kea, RN, CNS, CDE [phone])

## 2012-02-15 ENCOUNTER — Encounter (HOSPITAL_COMMUNITY): Payer: Self-pay | Admitting: Obstetrics and Gynecology

## 2012-02-15 LAB — GLUCOSE, CAPILLARY
Glucose-Capillary: 182 mg/dL — ABNORMAL HIGH (ref 70–99)
Glucose-Capillary: 213 mg/dL — ABNORMAL HIGH (ref 70–99)
Glucose-Capillary: 103 mg/dL — ABNORMAL HIGH (ref 70–99)
Glucose-Capillary: 248 mg/dL — ABNORMAL HIGH (ref 70–99)

## 2012-02-15 NOTE — Progress Notes (Signed)
2 Days Post-Op Procedure(s) (LRB): HYSTERECTOMY ABDOMINAL (N/A) CYSTOSCOPY (N/A) UNILATERAL SALPINGECTOMY (Right)  Subjective: Patient reports incisional pain, tolerating PO, + flatus and no problems voiding.    Objective: I have reviewed patient's vital signs, intake and output, medications and labs.  General: alert, cooperative and appears stated age Resp: clear to auscultation bilaterally Extremities: extremities normal, atraumatic, no cyanosis or edema Vaginal Bleeding: none Incision is clean, dry and intact Assessment: s/p Procedure(s) (LRB): HYSTERECTOMY ABDOMINAL (N/A) CYSTOSCOPY (N/A) UNILATERAL SALPINGECTOMY (Right): stable, progressing well and tolerating diet  Plan: Encourage ambulation Possible discharge on Saturday or Sunday  LOS: 2 days    Derriana Oser L 02/15/2012, 7:44 AM

## 2012-02-16 LAB — GLUCOSE, CAPILLARY
Glucose-Capillary: 150 mg/dL — ABNORMAL HIGH (ref 70–99)
Glucose-Capillary: 129 mg/dL — ABNORMAL HIGH (ref 70–99)
Glucose-Capillary: 209 mg/dL — ABNORMAL HIGH (ref 70–99)
Glucose-Capillary: 182 mg/dL — ABNORMAL HIGH (ref 70–99)

## 2012-02-16 MED ORDER — HYDROCODONE-ACETAMINOPHEN 5-325 MG PO TABS
1.0000 | ORAL_TABLET | Freq: Four times a day (QID) | ORAL | Status: DC | PRN
  Administered 2012-02-16 – 2012-02-17 (×2): 2 via ORAL
  Filled 2012-02-16 (×2): qty 2

## 2012-02-16 MED ORDER — GI COCKTAIL ~~LOC~~
30.0000 mL | Freq: Two times a day (BID) | ORAL | Status: DC | PRN
  Administered 2012-02-16: 30 mL via ORAL
  Filled 2012-02-16: qty 30

## 2012-02-16 NOTE — Progress Notes (Signed)
3 Days Post-Op Procedure(s) (LRB): HYSTERECTOMY ABDOMINAL (N/A) CYSTOSCOPY (N/A) UNILATERAL SALPINGECTOMY (Right)  Subjective: Patient reports incisional pain, tolerating PO and no problems voiding.  Complains of some epigastric discomfort.  Objective: I have reviewed patient's vital signs, intake and output, medications and labs.  General: alert, cooperative and appears stated age Resp: clear to auscultation bilaterally Cardio: regular rate and rhythm, S1, S2 normal, no murmur, click, rub or gallop Abdomen is soft non tender, incision is clean ,dry and intact Assessment: s/p Procedure(s) (LRB): HYSTERECTOMY ABDOMINAL (N/A) CYSTOSCOPY (N/A) UNILATERAL SALPINGECTOMY (Right): progressing well and tolerating diet  Plan: Encourage ambulation [facility] Discharge tomorrow  LOS: 3 days    Darryle Dennie L 02/16/2012, 8:07 AM

## 2012-02-17 MED ORDER — HYDROCODONE-ACETAMINOPHEN 5-325 MG PO TABS: 1.0000 | ORAL_TABLET | Freq: Four times a day (QID) | ORAL | Status: AC | PRN

## 2012-02-17 NOTE — Progress Notes (Signed)
4 Days Post-Op Procedure(s) (LRB): HYSTERECTOMY ABDOMINAL (N/A) CYSTOSCOPY (N/A) UNILATERAL SALPINGECTOMY (Right)  Subjective: Patient reports tolerating PO, + flatus, + BM and no problems voiding.    Objective: I have reviewed patient's vital signs, intake and output, medications and labs.  General: alert Resp: clear to auscultation bilaterally Cardio: regular rate and rhythm, S1, S2 normal, no murmur, click, rub or gallop  Assessment: s/p Procedure(s) (LRB): HYSTERECTOMY ABDOMINAL (N/A) CYSTOSCOPY (N/A) UNILATERAL SALPINGECTOMY (Right): stable, progressing well and tolerating diet  Plan: Advance diet Discharge home  LOS: 4 days    [name] L 02/17/2012, 8:09 AM

## 2012-02-17 NOTE — Discharge Summary (Signed)
Admission Diagnosis: Menorrhagia History of Renal Transplant x 2 Diabetes Chronic hypertension  Discharge Diagnosis: Same  Hospital Course: 36 year old female admitted for TAH. Because of history of renal transplantation x 2, Dr. Diona Fanti was present for the case because of possible injury to the ureters and history of peritoneal dialysis.  She underwent TAH and right salpingectomy and has had an uneventful post op course. Because she had some discomfort near her transplanted kidneys I chose to keep for an extra day for observation. Today, she feels improved and will be discharged home.  She will continue her home medications and she will followup with me in 1 week

## 2012-02-17 NOTE — Progress Notes (Signed)
Pt d/c home with family, via ambulatory to private car. D/C instructions and prescriptions reviewed with pt. Pt verbalized understanding.

## 2012-02-19 LAB — GLUCOSE, CAPILLARY: Glucose-Capillary: 114 mg/dL — ABNORMAL HIGH (ref 70–99)

## 2012-02-19 NOTE — Op Note (Signed)
Preoperative diagnosis:history of bilateral renal transplants, or abdominal hysterectomy Postoperative diagnosis:same  Procedure:cystoscopy, attempted placement of bilateral ureteral catheters (difficult)   Surgeon: Lillette Boxer. Giulia Hickey, M.D.  Anesthesia: Gen.  Indications:36 year old female who has a history of end-stage renal disease and is status post bilateral renal transplants. The patient states that her first transplant was in the left iliac fossa, the second was on the right. She also has a history of renal cell carcinoma and is status post right nephrectomy by Dr. Michaela Corner several years ago for this. There has been no evidence of recurrence.  The patient has abnormal vaginal bleeding from fibroids, and has failed conservative management. Dr. Dian Queen has spoken with the patient about hysterectomy. Because of her prior peritoneal dialysis and the possibility of adhesions, as well as the pelvic position of her kidneys, urologic consultation is requested for ureteral catheter placement to avoid injury during the surgery. I have seen the patient, and discussed cystoscopy and ureteral catheter placement. She consents to this.     Technique and findings: The patient was identified in the holding area. She received Indigo Carmine and intravenously. She was taken in the operating room were general endotracheal anesthetic was administered. She was placed in the dorsolithotomy position. Genitalia and perineum were prepped and draped. Time out was then performed.  I then placed a 22 [place] panendoscope. Inspection of the bladder revealed no mucosal lesions, and normal ureteral orifices in the trigone. The ectopic ureteral orifices from the transplants were identified.there was dye coming from the left-sided orifice which was located anteriolateral. The right transplant orifice was more easily identified, although it did not have dye coming from it. After about 15 minutes of probing, I was  eventually able to guide a guidewire, and then a 6 [place] open-ended catheter into the right transplant ureter. Unfortunately, after multiple attempts at the left side, I was unable to guide even a angle tip Teflon Glidewire into the orifice. This was felt to be the active kidney, as it was the only orifice with dye coming out. The 6 [place] open-ended catheter was left in place, but once the Foley catheter had been replaced into the bladder after cystoscopy, the ureteral catheter fell out. The Foley catheter was replaced, but further cystoscopic attempt at replacing the right ureteral catheter was not done. Because of the inability to cannulate the patient's transplant ureters, I remained during the open hysterectomy. Because of the extraperitoneal location of the ureters, they were not encountered during the uterine dissection. Following the hysterectomy, the Foley catheter was left in.  Dr. Helane Rima will dictate the hysterectomy separately.

## 2012-05-06 HISTORY — PX: OTHER SURGICAL HISTORY: SHX169

## 2012-10-09 DIAGNOSIS — Z79899 Other long term (current) drug therapy: Secondary | ICD-10-CM | POA: Insufficient documentation

## 2012-10-09 DIAGNOSIS — Z794 Long term (current) use of insulin: Secondary | ICD-10-CM | POA: Insufficient documentation

## 2012-10-09 DIAGNOSIS — L299 Pruritus, unspecified: Secondary | ICD-10-CM | POA: Insufficient documentation

## 2012-10-09 DIAGNOSIS — F411 Generalized anxiety disorder: Secondary | ICD-10-CM | POA: Insufficient documentation

## 2012-10-09 DIAGNOSIS — E119 Type 2 diabetes mellitus without complications: Secondary | ICD-10-CM | POA: Insufficient documentation

## 2012-10-09 DIAGNOSIS — K5090 Crohn's disease, unspecified, without complications: Secondary | ICD-10-CM | POA: Insufficient documentation

## 2012-10-10 ENCOUNTER — Emergency Department (HOSPITAL_COMMUNITY)
Admission: EM | Admit: 2012-10-10 | Discharge: 2012-10-10 | Disposition: A | Discharge status: Home / Self Care | Payer: Medicare Other | Attending: Emergency Medicine | Admitting: Emergency Medicine

## 2012-10-10 ENCOUNTER — Encounter (HOSPITAL_COMMUNITY): Payer: Self-pay | Admitting: *Deleted

## 2012-10-10 DIAGNOSIS — L299 Pruritus, unspecified: Secondary | ICD-10-CM

## 2012-10-10 LAB — CBC WITH DIFFERENTIAL/PLATELET
Basophils Absolute: 0 10*3/uL (ref 0.0–0.1)
Basophils Relative: 0 % (ref 0–1)
Eosinophils Absolute: 0.1 10*3/uL (ref 0.0–0.7)
Eosinophils Relative: 1 % (ref 0–5)
HCT: 34.8 % — ABNORMAL LOW (ref 36.0–46.0)
Hemoglobin: 11.4 g/dL — ABNORMAL LOW (ref 12.0–15.0)
Lymphocytes Relative: 19 % (ref 12–46)
Lymphs Abs: 1.6 10*3/uL (ref 0.7–4.0)
MCH: 27.3 pg (ref 26.0–34.0)
MCHC: 32.8 g/dL (ref 30.0–36.0)
MCV: 83.3 fL (ref 78.0–100.0)
Monocytes Absolute: 0.8 10*3/uL (ref 0.1–1.0)
Monocytes Relative: 9 % (ref 3–12)
Neutro Abs: 5.8 10*3/uL (ref 1.7–7.7)
Neutrophils Relative %: 71 % (ref 43–77)
Platelets: 286 10*3/uL (ref 150–400)
RBC: 4.18 MIL/uL (ref 3.87–5.11)
RDW: 13.4 % (ref 11.5–15.5)
WBC: 8.2 10*3/uL (ref 4.0–10.5)

## 2012-10-10 LAB — BASIC METABOLIC PANEL
BUN: 20 mg/dL (ref 6–23)
CO2: 21 mEq/L (ref 19–32)
Calcium: 9.6 mg/dL (ref 8.4–10.5)
Chloride: 100 mEq/L (ref 96–112)
Creatinine, Ser: 1.55 mg/dL — ABNORMAL HIGH (ref 0.50–1.10)
GFR calc Af Amer: 48 mL/min — ABNORMAL LOW (ref >90)
GFR calc non Af Amer: 42 mL/min — ABNORMAL LOW (ref >90)
Glucose, Bld: 282 mg/dL — ABNORMAL HIGH (ref 70–99)
Potassium: 4.5 mEq/L (ref 3.5–5.1)
Sodium: 133 mEq/L — ABNORMAL LOW (ref 135–145)

## 2012-10-10 MED ORDER — OXYCODONE-ACETAMINOPHEN 5-325 MG PO TABS
2.0000 | ORAL_TABLET | Freq: Once | ORAL | Status: AC
  Administered 2012-10-10: 2 via ORAL
  Filled 2012-10-10: qty 2

## 2012-10-10 MED ORDER — HYDROXYZINE HCL 25 MG PO TABS
50.0000 mg | ORAL_TABLET | Freq: Once | ORAL | Status: AC
  Administered 2012-10-10: 50 mg via ORAL
  Filled 2012-10-10: qty 2

## 2012-10-10 MED ORDER — HYDROXYZINE HCL 25 MG PO TABS: 25.0000 mg | ORAL_TABLET | Freq: Four times a day (QID) | ORAL | Status: DC

## 2012-10-10 NOTE — ED Notes (Signed)
Pt reports that she believes her electrolytes are abnormal and is experiencing muscle pain and cramping.  Reports she has taken extra Tums and VitD.  Pt is kidney transplant patient. Transplants in  1999 and 2011.

## 2012-10-10 NOTE — ED Provider Notes (Signed)
History     CSN: [id number]  Arrival date & time 10/09/12  2344   First MD Initiated Contact with Patient 10/10/12 0112      Chief Complaint  Patient presents with  . Abnormal Lab    (Consider location/radiation/quality/duration/timing/severity/associated sxs/prior treatment) HPI Comments: [name] is a 36 y.o. Female with about a generalized itching and muscle cramping. She feels like her potassium, and calcium are low. This has happened previously. There are no known modifying factors. She denies recent nausea, vomiting, fever, chills, diarrhea, back, or abdominal pain.  The history is provided by the patient.    Past Medical History  Diagnosis Date  . Asthma   . Chronic anemia   . Panic disorder   . Crohn's disease   . Complication of anesthesia     does not do well with sedation  . Diabetes mellitus   . Anxiety     OCD  . Renal disorder     Past Surgical History  Procedure Date  . Nephrectomy transplanted organ   . Parathyroidectomy   . Tubal ligation   . Knee surgery   . Abdominal hysterectomy 02/13/2012    Procedure: HYSTERECTOMY ABDOMINAL;  Surgeon: Cyril Mourning, MD;  Location: [facility];  Service: Gynecology;  Laterality: N/A;  . Cystoscopy 02/13/2012    Procedure: CYSTOSCOPY;  Surgeon: Franchot Gallo, MD;  Location: [facility];  Service: Urology;  Laterality: N/A;  insertion of ureteral catheter , removed per Dr Diona Fanti     History reviewed. No pertinent family history.  History  Substance Use Topics  . Smoking status: Never Smoker   . Smokeless tobacco: Never Used  . Alcohol Use: No    OB History    Grav Para Term Preterm Abortions TAB SAB Ect Mult Living                  Review of Systems  All other systems reviewed and are negative.    Allergies  Dilaudid; Ivp dye; Vancomycin; Barium-containing compounds; Iopamidol; Shellfish allergy; and Sulfa antibiotics  Home Medications   Current Outpatient Rx  Name Route Sig Dispense  Refill  . ATORVASTATIN CALCIUM 40 MG PO TABS Oral Take 40 mg by mouth daily.    . BUSPIRONE HCL 15 MG PO TABS Oral Take 15 mg by mouth 3 (three) times daily.      Marland Kitchen CALCITRIOL 0.5 MCG PO CAPS Oral Take 1 mcg by mouth daily.    Marland Kitchen CETIRIZINE HCL 10 MG PO TABS Oral Take 10 mg by mouth daily.    Marland Kitchen CILOSTAZOL 50 MG PO TABS Oral Take 50 mg by mouth 2 (two) times daily.    Marland Kitchen GABAPENTIN 300 MG PO CAPS Oral Take 300 mg by mouth 3 (three) times daily.    Marland Kitchen HYDROXYZINE HCL 50 MG PO TABS Oral Take 50 mg by mouth at bedtime.      . INSULIN ASPART 100 UNIT/ML [place] SOLN Subcutaneous Inject 4 Units into the skin 3 (three) times daily before meals.      . INSULIN GLARGINE 100 UNIT/ML [place] SOLN Subcutaneous Inject 10 Units into the skin at bedtime.     Marland Kitchen MYCOPHENOLATE SODIUM 180 MG PO TBEC Oral Take 360 mg by mouth 2 (two) times daily.     Marland Kitchen OMEPRAZOLE 20 MG PO CPDR Oral Take 40 mg by mouth 4 (four) times daily.     . OXYCODONE HCL 5 MG PO TABS Oral Take 10 mg by mouth every 8 (eight)  hours as needed. for chronic knee and back pain    . PREDNISONE 10 MG PO TABS Oral Take 10 mg by mouth daily.    Marland Kitchen PROMETHAZINE HCL 25 MG PO TABS Oral Take 50 mg by mouth as needed. For nausea    . SERTRALINE HCL 50 MG PO TABS Oral Take 200 mg by mouth every morning.     Marland Kitchen TACROLIMUS 1 MG PO CAPS Oral Take 6 mg by mouth 2 (two) times daily.     . ALBUTEROL SULFATE HFA 108 (90 BASE) MCG/ACT IN AERS Inhalation Inhale 2 puffs into the lungs every 6 (six) hours as needed. For shortness of breath     . ALPRAZOLAM 1 MG PO TABS Oral Take 1 mg by mouth 3 (three) times daily as needed. For anxiety    . CYCLOBENZAPRINE HCL 10 MG PO TABS Oral Take 10 mg by mouth at bedtime.    Marland Kitchen FLUOCINONIDE 0.05 % EX CREA Topical Apply 1 application topically 3 (three) times daily.      Marland Kitchen GLIPIZIDE 2.5 MG HALF TABLET Oral Take 2.5 mg by mouth at bedtime.     Marland Kitchen GLIPIZIDE 5 MG PO TABS Oral Take 5 mg by mouth daily after breakfast.    . MESALAMINE 1.2 G PO TBEC  Oral Take 2.4 g by mouth 3 (three) times daily.     Marland Kitchen OCTREOTIDE ACETATE 200 MCG/ML [place] SOLN Injection Inject 200 mcg as directed 2 (two) times daily.    Marland Kitchen ONDANSETRON 8 MG PO TBDP Oral Take 8 mg by mouth 3 (three) times daily with meals.    Marland Kitchen PRENATAL MULTIVITAMIN CH Oral Take 1 tablet by mouth daily.    Marland Kitchen SITAGLIPTIN PHOSPHATE 50 MG PO TABS Oral Take 50 mg by mouth daily.      . SODIUM BICARBONATE 650 MG PO TABS Oral Take 650 mg by mouth 3 (three) times daily.    Marland Kitchen TIOTROPIUM BROMIDE MONOHYDRATE 18 MCG IN CAPS Inhalation Place 18 mcg into inhaler and inhale every morning.        BP 136/88  Pulse 92  Temp 98.7 F (37.1 C)  Resp 16  Ht 5\' 3"  (1.6 m)  Wt 246 lb (111.585 kg)  BMI 43.58 kg/m2  SpO2 99%  Physical Exam  Nursing note and vitals reviewed. Constitutional: She is oriented to person, place, and time. She appears well-developed and well-nourished.  HENT:  Head: Normocephalic and atraumatic.  Eyes: Conjunctivae normal and EOM are normal. Pupils are equal, round, and reactive to light.  Neck: Normal range of motion and phonation normal. Neck supple.  Cardiovascular: Normal rate, regular rhythm and intact distal pulses.   Pulmonary/Chest: Effort normal and breath sounds normal. She exhibits no tenderness.  Abdominal: Soft. She exhibits no distension. There is no tenderness. There is no guarding.  Musculoskeletal: Normal range of motion.  Neurological: She is alert and oriented to person, place, and time. She has normal strength. She exhibits normal muscle tone.  Skin: Skin is warm and dry.  Psychiatric: She has a normal mood and affect. Her behavior is normal. Judgment and thought content normal.    ED Course  Procedures (including critical care time)  Emergency department treatment: Percocet and Vistaril.  Reevaluation 0340 hours. She feels better. There are no further complaints  Labs Reviewed  CBC WITH DIFFERENTIAL - Abnormal; Notable for the following:    Hemoglobin  11.4 (*)     HCT 34.8 (*)     All other components within  normal limits  BASIC METABOLIC PANEL - Abnormal; Notable for the following:    Sodium 133 (*)     Glucose, Bld 282 (*)     Creatinine, Ser 1.55 (*)     GFR calc non Af Amer 42 (*)     GFR calc Af Amer 48 (*)     All other components within normal limits  URINALYSIS, ROUTINE W REFLEX MICROSCOPIC   No results found.   1. Itching       MDM  Nonspecific, and recurrent pruritus. Doubt metabolic instability, serious bacterial infection or impending vascular collapse; the patient is stable for discharge.   Plan: Home Medications- usual; Home Treatments- rest; Recommended follow up- PCP prn       Richarda Blade, MD 10/10/12 [phone]

## 2012-11-21 ENCOUNTER — Encounter (HOSPITAL_COMMUNITY): Payer: Self-pay | Admitting: *Deleted

## 2012-11-21 ENCOUNTER — Emergency Department (HOSPITAL_COMMUNITY): Payer: Medicare Other

## 2012-11-21 ENCOUNTER — Emergency Department (HOSPITAL_COMMUNITY)
Admission: EM | Admit: 2012-11-21 | Discharge: 2012-11-22 | Disposition: A | Discharge status: Home / Self Care | Payer: Medicare Other | Attending: Emergency Medicine | Admitting: Emergency Medicine

## 2012-11-21 DIAGNOSIS — J45909 Unspecified asthma, uncomplicated: Secondary | ICD-10-CM | POA: Insufficient documentation

## 2012-11-21 DIAGNOSIS — J3489 Other specified disorders of nose and nasal sinuses: Secondary | ICD-10-CM | POA: Insufficient documentation

## 2012-11-21 DIAGNOSIS — N259 Disorder resulting from impaired renal tubular function, unspecified: Secondary | ICD-10-CM | POA: Insufficient documentation

## 2012-11-21 DIAGNOSIS — F410 Panic disorder [episodic paroxysmal anxiety] without agoraphobia: Secondary | ICD-10-CM | POA: Insufficient documentation

## 2012-11-21 DIAGNOSIS — K5090 Crohn's disease, unspecified, without complications: Secondary | ICD-10-CM | POA: Insufficient documentation

## 2012-11-21 DIAGNOSIS — D649 Anemia, unspecified: Secondary | ICD-10-CM | POA: Insufficient documentation

## 2012-11-21 DIAGNOSIS — R059 Cough, unspecified: Secondary | ICD-10-CM | POA: Insufficient documentation

## 2012-11-21 DIAGNOSIS — Z79899 Other long term (current) drug therapy: Secondary | ICD-10-CM | POA: Insufficient documentation

## 2012-11-21 DIAGNOSIS — R0602 Shortness of breath: Secondary | ICD-10-CM | POA: Insufficient documentation

## 2012-11-21 DIAGNOSIS — R05 Cough: Secondary | ICD-10-CM | POA: Insufficient documentation

## 2012-11-21 DIAGNOSIS — J40 Bronchitis, not specified as acute or chronic: Secondary | ICD-10-CM | POA: Insufficient documentation

## 2012-11-21 DIAGNOSIS — E119 Type 2 diabetes mellitus without complications: Secondary | ICD-10-CM | POA: Insufficient documentation

## 2012-11-21 DIAGNOSIS — Z794 Long term (current) use of insulin: Secondary | ICD-10-CM | POA: Insufficient documentation

## 2012-11-21 LAB — CBC WITH DIFFERENTIAL/PLATELET
Band Neutrophils: 0 % (ref 0–10)
Basophils Absolute: 0 10*3/uL (ref 0.0–0.1)
Basophils Relative: 0 % (ref 0–1)
Blasts: 0 %
Eosinophils Absolute: 0.1 10*3/uL (ref 0.0–0.7)
Eosinophils Relative: 1 % (ref 0–5)
HCT: 35.4 % — ABNORMAL LOW (ref 36.0–46.0)
Hemoglobin: 11.4 g/dL — ABNORMAL LOW (ref 12.0–15.0)
Lymphocytes Relative: 11 % — ABNORMAL LOW (ref 12–46)
Lymphs Abs: 1 10*3/uL (ref 0.7–4.0)
MCH: 27.7 pg (ref 26.0–34.0)
MCHC: 32.2 g/dL (ref 30.0–36.0)
MCV: 85.9 fL (ref 78.0–100.0)
Metamyelocytes Relative: 0 %
Monocytes Absolute: 0.8 10*3/uL (ref 0.1–1.0)
Monocytes Relative: 9 % (ref 3–12)
Myelocytes: 0 %
Neutro Abs: 6.8 10*3/uL (ref 1.7–7.7)
Neutrophils Relative %: 79 % — ABNORMAL HIGH (ref 43–77)
Platelets: 245 10*3/uL (ref 150–400)
Promyelocytes Absolute: 0 %
RBC: 4.12 MIL/uL (ref 3.87–5.11)
RDW: 13.7 % (ref 11.5–15.5)
WBC: 8.7 10*3/uL (ref 4.0–10.5)
nRBC: 0 /100 WBC

## 2012-11-21 LAB — BASIC METABOLIC PANEL
BUN: 25 mg/dL — ABNORMAL HIGH (ref 6–23)
CO2: 23 mEq/L (ref 19–32)
Calcium: 9.3 mg/dL (ref 8.4–10.5)
Chloride: 99 mEq/L (ref 96–112)
Creatinine, Ser: 1.84 mg/dL — ABNORMAL HIGH (ref 0.50–1.10)
GFR calc Af Amer: 39 mL/min — ABNORMAL LOW (ref >90)
GFR calc non Af Amer: 34 mL/min — ABNORMAL LOW (ref >90)
Glucose, Bld: 218 mg/dL — ABNORMAL HIGH (ref 70–99)
Potassium: 4.4 mEq/L (ref 3.5–5.1)
Sodium: 135 mEq/L (ref 135–145)

## 2012-11-21 LAB — TROPONIN I: Troponin I: <0.3 ng/mL (ref <0.30)

## 2012-11-21 MED ORDER — AZITHROMYCIN 250 MG PO TABS
500.0000 mg | ORAL_TABLET | Freq: Once | ORAL | Status: AC
  Administered 2012-11-21: 500 mg via ORAL
  Filled 2012-11-21: qty 2

## 2012-11-21 MED ORDER — BENZONATATE 100 MG PO CAPS
200.0000 mg | ORAL_CAPSULE | Freq: Once | ORAL | Status: AC
  Administered 2012-11-21: 200 mg via ORAL
  Filled 2012-11-21: qty 2

## 2012-11-21 NOTE — ED Provider Notes (Signed)
History   This chart was scribed for Erin Acosta, MD by [name], ED Scribe. The patient was seen in room APA05/APA05 and the patient's care was started at 11:09PM.    CSN: [id number]  Arrival date & time 11/21/12  2155   None     Chief Complaint  Patient presents with  . Chest Pain  . Cough  . Nasal Congestion  . Shortness of Breath    (Consider location/radiation/quality/duration/timing/severity/associated sxs/prior treatment) The history is provided by the patient. No language interpreter was used.   [name] is a 36 y.o. female who presents to the Emergency Department complaining of constant, moderate to severe, central chest pain that radiates to her neck and shoulders with an onset yesterday. [name] reports she started with sore throat and otalgia about a week ago which is now gone today, then she reported having shortness of breath, nasal congestion, and non-productive coughing for the last 4 days. She does report that she feels "rattling" when she breathes. An inhaler she had at home did not alleviate the symptoms. After those symptoms developed, she stared to have the chest pain. Deep inhalation aggravates the chest pain. Denies HA, fever, rash, back pain, abdominal pain, nausea, emesis, diarrhea, dysuria, or extremity edema, weakness, numbness, or tingling. No known allergies. No other pertinent medical symptoms.   Past Medical History  Diagnosis Date  . Asthma   . Chronic anemia   . Panic disorder   . Crohn's disease   . Complication of anesthesia     does not do well with sedation  . Diabetes mellitus   . Anxiety     OCD  . Renal disorder     Past Surgical History  Procedure Date  . Nephrectomy transplanted organ   . Parathyroidectomy   . Tubal ligation   . Knee surgery   . Abdominal hysterectomy 02/13/2012    Procedure: HYSTERECTOMY ABDOMINAL;  Surgeon: Cyril Mourning, MD;  Location: [facility];  Service: Gynecology;  Laterality: N/A;    . Cystoscopy 02/13/2012    Procedure: CYSTOSCOPY;  Surgeon: Franchot Gallo, MD;  Location: [facility];  Service: Urology;  Laterality: N/A;  insertion of ureteral catheter , removed per Dr Diona Fanti     History reviewed. No pertinent family history.  History  Substance Use Topics  . Smoking status: Never Smoker   . Smokeless tobacco: Never Used  . Alcohol Use: No    OB History    Grav Para Term Preterm Abortions TAB SAB Ect Mult Living                  Review of Systems  Respiratory: Positive for cough and shortness of breath.   Cardiovascular: Positive for chest pain.  All other systems reviewed and are negative.   10 Systems reviewed and all are negative for acute change except as noted in the HPI.   Allergies  Dilaudid; Ivp dye; Vancomycin; Adhesive; Barium-containing compounds; Iopamidol; Iron dextran; Shellfish allergy; and Sulfa antibiotics  Home Medications   Current Outpatient Rx  Name  Route  Sig  Dispense  Refill  . ALBUTEROL SULFATE HFA 108 (90 BASE) MCG/ACT IN AERS   Inhalation   Inhale 2 puffs into the lungs every 6 (six) hours as needed. For shortness of breath          . ALBUTEROL SULFATE (2.5 MG/3ML) 0.083% IN NEBU   Nebulization   Take 2.5 mg by nebulization every 6 (six) hours as needed. For shortness  of breath         . ALPRAZOLAM 1 MG PO TABS   Oral   Take 1 mg by mouth 3 (three) times daily as needed. For anxiety         . ATORVASTATIN CALCIUM 40 MG PO TABS   Oral   Take 40 mg by mouth at bedtime.          . BUSPIRONE HCL 15 MG PO TABS   Oral   Take 15 mg by mouth 2 (two) times daily.          Marland Kitchen CALCITRIOL 0.25 MCG PO CAPS   Oral   Take 0.5 mcg by mouth 3 (three) times daily.         Marland Kitchen CALCIUM GLUCONATE 500 MG PO TABS   Oral   Take 500 mg by mouth 3 (three) times daily.         Marland Kitchen CETIRIZINE HCL 10 MG PO TABS   Oral   Take 10 mg by mouth every evening.          Marland Kitchen CILOSTAZOL 50 MG PO TABS   Oral   Take 50 mg by  mouth 2 (two) times daily.         . CYCLOBENZAPRINE HCL 10 MG PO TABS   Oral   Take 10 mg by mouth daily as needed. For muscle spasms         . GABAPENTIN 300 MG PO CAPS   Oral   Take 300 mg by mouth daily.          . INSULIN ASPART 100 UNIT/ML [place] SOLN   Subcutaneous   Inject 20-30 Units into the skin 3 (three) times daily before meals. Inject 20 units SQ with breakfast and lunch, then 30 units daily SQ with dinner         . INSULIN GLARGINE 100 UNIT/ML [place] SOLN   Subcutaneous   Inject 18 Units into the skin at bedtime.          Marland Kitchen MYCOPHENOLATE SODIUM 180 MG PO TBEC   Oral   Take 360 mg by mouth 2 (two) times daily.          Marland Kitchen OMEPRAZOLE 40 MG PO CPDR   Oral   Take 40 mg by mouth 3 (three) times daily.         Marland Kitchen ONDANSETRON 8 MG PO TBDP   Oral   Take 8 mg by mouth 3 (three) times daily with meals.         . OXYCODONE HCL 10 MG PO TABS   Oral   Take 10 mg by mouth 3 (three) times daily as needed. For pain         . PREDNISONE 10 MG PO TABS   Oral   Take 10 mg by mouth daily.         Marland Kitchen PROMETHAZINE HCL 25 MG PO TABS   Oral   Take 25-50 mg by mouth every 6 (six) hours as needed. For nausea         . SERTRALINE HCL 100 MG PO TABS   Oral   Take 200 mg by mouth daily.         Marland Kitchen TACROLIMUS 5 MG PO CAPS   Oral   Take 5 mg by mouth 2 (two) times daily.         Marland Kitchen TIOTROPIUM BROMIDE MONOHYDRATE 18 MCG IN CAPS   Inhalation   Place 18 mcg into inhaler and inhale  every morning.           . AZITHROMYCIN 250 MG PO TABS   Oral   Take 1 tablet (250 mg total) by mouth daily. 500mg  PO day 1, then 250mg  PO days 205   6 tablet   0   . BENZONATATE 100 MG PO CAPS   Oral   Take 2 capsules (200 mg total) by mouth 2 (two) times daily as needed for cough.   20 capsule   0   . FLUOCINONIDE 0.05 % EX CREA   Topical   Apply 1 application topically 3 (three) times daily.             BP 111/71  Pulse 75  Temp 98.5 F (36.9 C) (Oral)  Resp 20   Ht 5\' 3"  (1.6 m)  Wt 240 lb (108.863 kg)  BMI 42.51 kg/m2  SpO2 94%  LMP 01/30/2012  Physical Exam  Nursing note and vitals reviewed. Constitutional: No distress.       Awake, alert, nontoxic appearance.  HENT:  Head: Atraumatic.  Right Ear: External ear normal.  Left Ear: External ear normal.  Mouth/Throat: Uvula is midline and mucous membranes are normal. Posterior oropharyngeal erythema present. No oropharyngeal exudate, posterior oropharyngeal edema or tonsillar abscesses.  Eyes: Right eye exhibits no discharge. Left eye exhibits no discharge.  Neck: Neck supple.  Cardiovascular: Normal rate, regular rhythm and normal heart sounds.   No murmur heard.      Good thrill in fistula of the RUE  Pulmonary/Chest: Effort normal and breath sounds normal. No respiratory distress. She has no wheezes. She has no rales. She exhibits no tenderness.  Abdominal: Soft. There is no tenderness. There is no rebound.  Musculoskeletal: She exhibits no tenderness.       Baseline ROM, no obvious new focal weakness.  Neurological:       Mental status and motor strength appears baseline for patient and situation.  Skin: No rash noted.       Right upper arm has dialysis shunt with good thrill.  Psychiatric: She has a normal mood and affect.    ED Course  Procedures (including critical care time)  COORDINATION OF CARE:  11:13PM - lab and imaging results reviewed and are unremarkable. Antiinflammatory meds is advised for the pt at home. Once all lab results are back and are normal, she is ready for d/c.   Labs Reviewed  CBC WITH DIFFERENTIAL - Abnormal; Notable for the following:    Hemoglobin 11.4 (*)     HCT 35.4 (*)     Neutrophils Relative 79 (*)     Lymphocytes Relative 11 (*)     All other components within normal limits  BASIC METABOLIC PANEL - Abnormal; Notable for the following:    Glucose, Bld 218 (*)     BUN 25 (*)     Creatinine, Ser 1.84 (*)     GFR calc non Af Amer 34 (*)      GFR calc Af Amer 39 (*)     All other components within normal limits  TROPONIN I   Dg Chest 2 View  11/21/2012  *RADIOLOGY REPORT*  Clinical Data:  Chest pain, cough and congestion.  CHEST - 2 VIEW  Comparison: 11/06/2011  Findings: The heart size and mediastinal contours are within normal limits.  Both lungs are clear.  The visualized skeletal structures are unremarkable.  IMPRESSION: No active disease.   Original Report Authenticated By: Aletta Edouard, M.D.  1. Bronchitis       MDM  Body ache, pharyngitis, cough, earache - c/w viral syndrome, likley bronchitis - I have personally interpretted the xray and find there to be no infiltrates or soft tissue / mediastinal abnormalities on a PA and Lateral views of the chest.  Labs show NO leukocytosis and her renal function is similar but slightly worse than prior testing from one month ago with a Cr of 1.84 c/w 1.55.  She is tolerating fluids, taking meds including Tessalon and Zithromax here and appears stable for d/c with normal VS and no fever.  On my exam the patient's oxygen saturation is 98% on room air. She has normal lung sounds  Filed Vitals:   11/21/12 2339  BP: 111/71  Pulse: 75  Temp: 98.5 F (36.9 C)  TempSrc: Oral  Resp: 20  Height: 5\' 3"  (1.6 m)  Weight: 240 lb (108.863 kg)  SpO2: 94%     ED ECG REPORT  I personally interpreted this EKG   Date: 11/22/2012   Rate: 75  Rhythm: normal sinus rhythm  QRS Axis: left  Intervals: normal  ST/T Wave abnormalities: normal  Conduction Disutrbances:none  Narrative Interpretation:   Old EKG Reviewed: Compared with 11/06/11, axis has shifted leftward    I personally performed the services described in this documentation, which was scribed in my presence. The recorded information has been reviewed and is accurate.          Erin Acosta, MD 11/22/12 [id number]

## 2012-11-21 NOTE — ED Notes (Signed)
Cough, nasal congestion and SOB x4 days. Chest pain x1.5 days.

## 2012-11-21 NOTE — ED Notes (Signed)
Pt c/o cough, congestion, sneezing, and sob x 4 days. Pt c/o chest pain x 1.5 days. Pt states pain is in the center of her chest and radiates to her neck and shoulders.

## 2012-11-22 MED ORDER — AZITHROMYCIN 250 MG PO TABS: 250.0000 mg | ORAL_TABLET | Freq: Every day | ORAL | Status: DC

## 2012-11-22 MED ORDER — BENZONATATE 100 MG PO CAPS: 200.0000 mg | ORAL_CAPSULE | Freq: Two times a day (BID) | ORAL | Status: DC | PRN

## 2013-02-08 ENCOUNTER — Emergency Department (HOSPITAL_COMMUNITY)
Admission: EM | Admit: 2013-02-08 | Discharge: 2013-02-08 | Disposition: A | Discharge status: Home / Self Care | Payer: Medicare HMO | Attending: Emergency Medicine | Admitting: Emergency Medicine

## 2013-02-08 ENCOUNTER — Emergency Department (HOSPITAL_COMMUNITY): Payer: Medicare HMO

## 2013-02-08 ENCOUNTER — Encounter (HOSPITAL_COMMUNITY): Payer: Self-pay | Admitting: *Deleted

## 2013-02-08 DIAGNOSIS — Z79899 Other long term (current) drug therapy: Secondary | ICD-10-CM | POA: Insufficient documentation

## 2013-02-08 DIAGNOSIS — R110 Nausea: Secondary | ICD-10-CM | POA: Insufficient documentation

## 2013-02-08 DIAGNOSIS — R112 Nausea with vomiting, unspecified: Secondary | ICD-10-CM | POA: Insufficient documentation

## 2013-02-08 DIAGNOSIS — E119 Type 2 diabetes mellitus without complications: Secondary | ICD-10-CM | POA: Insufficient documentation

## 2013-02-08 DIAGNOSIS — M25569 Pain in unspecified knee: Secondary | ICD-10-CM | POA: Insufficient documentation

## 2013-02-08 DIAGNOSIS — Z794 Long term (current) use of insulin: Secondary | ICD-10-CM | POA: Insufficient documentation

## 2013-02-08 DIAGNOSIS — Z862 Personal history of diseases of the blood and blood-forming organs and certain disorders involving the immune mechanism: Secondary | ICD-10-CM | POA: Insufficient documentation

## 2013-02-08 DIAGNOSIS — G8929 Other chronic pain: Secondary | ICD-10-CM | POA: Insufficient documentation

## 2013-02-08 DIAGNOSIS — Z8719 Personal history of other diseases of the digestive system: Secondary | ICD-10-CM | POA: Insufficient documentation

## 2013-02-08 DIAGNOSIS — F411 Generalized anxiety disorder: Secondary | ICD-10-CM | POA: Insufficient documentation

## 2013-02-08 DIAGNOSIS — M549 Dorsalgia, unspecified: Secondary | ICD-10-CM | POA: Insufficient documentation

## 2013-02-08 DIAGNOSIS — F410 Panic disorder [episodic paroxysmal anxiety] without agoraphobia: Secondary | ICD-10-CM | POA: Insufficient documentation

## 2013-02-08 DIAGNOSIS — J45909 Unspecified asthma, uncomplicated: Secondary | ICD-10-CM | POA: Insufficient documentation

## 2013-02-08 MED ORDER — OXYCODONE-ACETAMINOPHEN 5-325 MG PO TABS
1.0000 | ORAL_TABLET | Freq: Once | ORAL | Status: AC
  Administered 2013-02-08: 1 via ORAL
  Filled 2013-02-08: qty 1

## 2013-02-08 MED ORDER — TRAMADOL HCL 50 MG PO TABS: 50.0000 mg | ORAL_TABLET | Freq: Four times a day (QID) | ORAL | Status: DC | PRN

## 2013-02-08 NOTE — ED Notes (Signed)
Pt states lt knee started hurting x1 wk, progressively worse. Ambulated from wheelchair to bed. Pt denies injury but has had previous knee surgery 2005.

## 2013-02-08 NOTE — ED Notes (Signed)
Pt requested RN shred tramadol prescription, states she does not need any pain medication and only uses tylenol at home. Prescription placed in shred box at nurse's station.

## 2013-02-08 NOTE — ED Provider Notes (Signed)
History     CSN: [id number]  Arrival date & time 02/08/13  [id number]   First MD Initiated Contact with Patient 02/08/13 [phone]      Chief Complaint  Patient presents with  . Knee Pain    lt knee  . Nausea    HPI [name] is a 37 y.o. female who presents to the ED with knee pain. The pain is located in the left knee. Onset of pain was gradual and has progressed over the past week. Hx of knee surgery 2005 for torn meniscus with [facility].  Has not had any problems with the knee until last week. She describes the pain as a throbbing that is constant. Feels like her knee won't support her. She has taken Flexeril and tylenol without relief. The history was provided by the patient.  Past Medical History  Diagnosis Date  . Asthma   . Chronic anemia   . Panic disorder   . Crohn's disease   . Complication of anesthesia     does not do well with sedation  . Diabetes mellitus   . Anxiety     OCD  . Renal disorder     Past Surgical History  Procedure Laterality Date  . Nephrectomy transplanted organ    . Parathyroidectomy    . Tubal ligation    . Knee surgery    . Abdominal hysterectomy  02/13/2012    Procedure: HYSTERECTOMY ABDOMINAL;  Surgeon: Cyril Mourning, MD;  Location: [facility];  Service: Gynecology;  Laterality: N/A;  . Cystoscopy  02/13/2012    Procedure: CYSTOSCOPY;  Surgeon: Franchot Gallo, MD;  Location: [facility];  Service: Urology;  Laterality: N/A;  insertion of ureteral catheter , removed per Dr Diona Fanti     History reviewed. No pertinent family history.  History  Substance Use Topics  . Smoking status: Never Smoker   . Smokeless tobacco: Never Used  . Alcohol Use: No    OB History   Grav Para Term Preterm Abortions TAB SAB Ect Mult Living                  Review of Systems  Constitutional: Negative for fever, chills and activity change.  HENT: Negative for congestion and facial swelling.   Eyes: Negative for visual disturbance.  Respiratory:  Negative for cough and wheezing.   Cardiovascular: Negative for chest pain and palpitations.  Gastrointestinal: Positive for nausea and vomiting. Negative for abdominal pain.  Musculoskeletal: Positive for back pain (chronic).       Left knee pain  Skin: Negative for rash.  Neurological: Negative for dizziness and headaches.  Psychiatric/Behavioral: Negative for confusion. The patient is nervous/anxious.     Allergies  Dilaudid; Ivp dye; Vancomycin; Adhesive; Barium-containing compounds; Iopamidol; Iron dextran; Shellfish allergy; and Sulfa antibiotics  Home Medications   Current Outpatient Rx  Name  Route  Sig  Dispense  Refill  . albuterol (PROVENTIL HFA;VENTOLIN HFA) 108 (90 BASE) MCG/ACT inhaler   Inhalation   Inhale 2 puffs into the lungs every 6 (six) hours as needed. For shortness of breath          . albuterol (PROVENTIL) (2.5 MG/3ML) 0.083% nebulizer solution   Nebulization   Take 2.5 mg by nebulization every 6 (six) hours as needed. For shortness of breath         . ALPRAZolam (XANAX) 1 MG tablet   Oral   Take 1 mg by mouth 3 (three) times daily as needed. For anxiety         .  atorvastatin (LIPITOR) 40 MG tablet   Oral   Take 40 mg by mouth at bedtime.          Marland Kitchen azithromycin (ZITHROMAX Z-PAK) 250 MG tablet   Oral   Take 1 tablet (250 mg total) by mouth daily. 500mg  PO day 1, then 250mg  PO days 205   6 tablet   0   . benzonatate (TESSALON) 100 MG capsule   Oral   Take 2 capsules (200 mg total) by mouth 2 (two) times daily as needed for cough.   20 capsule   0   . busPIRone (BUSPAR) 15 MG tablet   Oral   Take 15 mg by mouth 2 (two) times daily.          . calcitRIOL (ROCALTROL) 0.25 MCG capsule   Oral   Take 0.5 mcg by mouth 3 (three) times daily.         . calcium gluconate 500 MG tablet   Oral   Take 500 mg by mouth 3 (three) times daily.         . cetirizine (ZYRTEC) 10 MG tablet   Oral   Take 10 mg by mouth every evening.            . cilostazol (PLETAL) 50 MG tablet   Oral   Take 50 mg by mouth 2 (two) times daily.         . cyclobenzaprine (FLEXERIL) 10 MG tablet   Oral   Take 10 mg by mouth daily as needed. For muscle spasms         . fluocinonide (LIDEX) 0.05 % cream   Topical   Apply 1 application topically 3 (three) times daily.           Marland Kitchen gabapentin (NEURONTIN) 300 MG capsule   Oral   Take 300 mg by mouth daily.          . insulin aspart (NOVOLOG) 100 UNIT/ML injection   Subcutaneous   Inject 20-30 Units into the skin 3 (three) times daily before meals. Inject 20 units SQ with breakfast and lunch, then 30 units daily SQ with dinner         . insulin glargine (LANTUS) 100 UNIT/ML injection   Subcutaneous   Inject 18 Units into the skin at bedtime.          . mycophenolate (MYFORTIC) 180 MG EC tablet   Oral   Take 360 mg by mouth 2 (two) times daily.          Marland Kitchen omeprazole (PRILOSEC) 40 MG capsule   Oral   Take 40 mg by mouth 3 (three) times daily.         . ondansetron (ZOFRAN-ODT) 8 MG disintegrating tablet   Oral   Take 8 mg by mouth 3 (three) times daily with meals.         . Oxycodone HCl 10 MG TABS   Oral   Take 10 mg by mouth 3 (three) times daily as needed. For pain         . predniSONE (DELTASONE) 10 MG tablet   Oral   Take 10 mg by mouth daily.         . promethazine (PHENERGAN) 25 MG tablet   Oral   Take 25-50 mg by mouth every 6 (six) hours as needed. For nausea         . sertraline (ZOLOFT) 100 MG tablet   Oral   Take 200 mg by mouth daily.         Marland Kitchen  tacrolimus (PROGRAF) 5 MG capsule   Oral   Take 5 mg by mouth 2 (two) times daily.         Marland Kitchen tiotropium (SPIRIVA) 18 MCG inhalation capsule   Inhalation   Place 18 mcg into inhaler and inhale every morning.             BP 125/75  Pulse 86  Temp(Src) 98.1 F (36.7 C) (Oral)  Ht 5\' 3"  (1.6 m)  Wt 260 lb 2.3 oz (118 kg)  BMI 46.09 kg/m2  SpO2 94%  LMP 01/30/2012  Physical Exam   Nursing note and vitals reviewed. Constitutional: She is oriented to person, place, and time. She appears well-developed and well-nourished.  HENT:  Head: Normocephalic and atraumatic.  Eyes: EOM are normal. Pupils are equal, round, and reactive to light.  Neck: Normal range of motion. Neck supple.  Cardiovascular: Normal rate and regular rhythm.   Pulmonary/Chest: Effort normal.  Abdominal: Soft. There is no tenderness.  Musculoskeletal:       Left knee: She exhibits decreased range of motion and effusion. She exhibits no deformity, no erythema and normal patellar mobility. Tenderness found. Patellar tendon tenderness noted.       Legs: There is tenderness over the patella with palpation and range of motion. Pedal pulses strong and equal bilateral. Adequate circulation.  Neurological: She is alert and oriented to person, place, and time. She has normal strength. No cranial nerve deficit.  Skin: Skin is warm and dry.  Psychiatric: She has a normal mood and affect. Her behavior is normal. Judgment and thought content normal.   Procedures  Dg Knee Complete 4 Views Left  02/08/2013  *RADIOLOGY REPORT*  Clinical Data: Left knee pain  LEFT KNEE - COMPLETE 4+ VIEW  Comparison: None.  Findings: No acute fracture and no dislocation.  Vascular calcifications are noted.  Small joint effusion.  Mild degenerative change.  IMPRESSION: No acute bony pathology.  Mild degenerative change.  Small joint effusion.   Original Report Authenticated By: Marybelle Killings, M.D.    Assessment: 37 y.o. female with left knee pain   Small joint effusion  Plan:  Knee immobilizer, ice, elevate   Follow up with [facility]    Discussed with the patient and all questioned fully answered.    Medication List    TAKE these medications       traMADol 50 MG tablet  Commonly known as:  ULTRAM  Take 1 tablet (50 mg total) by mouth every 6 (six) hours as needed for pain.      ASK your doctor about these medications        albuterol 108 (90 BASE) MCG/ACT inhaler  Commonly known as:  PROVENTIL HFA;VENTOLIN HFA  Inhale 2 puffs into the lungs every 6 (six) hours as needed. For shortness of breath     albuterol (2.5 MG/3ML) 0.083% nebulizer solution  Commonly known as:  PROVENTIL  Take 2.5 mg by nebulization every 6 (six) hours as needed. For shortness of breath     ALPRAZolam 1 MG tablet  Commonly known as:  XANAX  Take 1 mg by mouth 3 (three) times daily as needed for anxiety. For anxiety     atorvastatin 40 MG tablet  Commonly known as:  LIPITOR  Take 20 mg by mouth at bedtime.     busPIRone 15 MG tablet  Commonly known as:  BUSPAR  Take 15 mg by mouth 2 (two) times daily.     calcitRIOL 0.25 MCG capsule  Commonly known as:  ROCALTROL  Take 0.5 mcg by mouth 3 (three) times daily.     cetirizine 10 MG tablet  Commonly known as:  ZYRTEC  Take 10 mg by mouth every evening.     cilostazol 50 MG tablet  Commonly known as:  PLETAL  Take 50 mg by mouth 2 (two) times daily.     gabapentin 100 MG capsule  Commonly known as:  NEURONTIN  Take 100 mg by mouth daily.     insulin aspart 100 UNIT/ML injection  Commonly known as:  novoLOG  Inject 20-30 Units into the skin 3 (three) times daily before meals. Inject 20 units SQ with breakfast and lunch, then 30 units daily SQ with dinner     insulin glargine 100 UNIT/ML injection  Commonly known as:  LANTUS  Inject 18 Units into the skin at bedtime.     mycophenolate 180 MG EC tablet  Commonly known as:  MYFORTIC  Take 360 mg by mouth 2 (two) times daily.     omeprazole 40 MG capsule  Commonly known as:  PRILOSEC  Take 40 mg by mouth 3 (three) times daily.     Oxycodone HCl 10 MG Tabs  Take 10 mg by mouth 3 (three) times daily. For pain     oyster calcium 500 MG Tabs  Take 500 mg of elemental calcium by mouth 3 (three) times daily.     pantoprazole 40 MG tablet  Commonly known as:  PROTONIX  Take 40 mg by mouth daily.     predniSONE 10 MG  tablet  Commonly known as:  DELTASONE  Take 10 mg by mouth daily.     promethazine 25 MG tablet  Commonly known as:  PHENERGAN  Take 25-50 mg by mouth every 6 (six) hours as needed. For nausea     sertraline 100 MG tablet  Commonly known as:  ZOLOFT  Take 150 mg by mouth daily.     tacrolimus 5 MG capsule  Commonly known as:  PROGRAF  Take 5 mg by mouth 2 (two) times daily.           [place], [place] 02/08/13 [phone]

## 2013-02-09 NOTE — ED Provider Notes (Signed)
Medical screening examination/treatment/procedure(s) were performed by non-physician practitioner and as supervising physician I was immediately available for consultation/collaboration. Rolland Porter, MD, Abram Sander   Janice Norrie, MD 02/09/13 [phone]

## 2013-03-25 ENCOUNTER — Telehealth: Payer: Self-pay | Admitting: Family Medicine

## 2013-03-25 MED ORDER — INSULIN PEN NEEDLE 30G X 8 MM MISC: 1.0000 | Status: DC | PRN

## 2013-03-25 NOTE — Telephone Encounter (Signed)
rx refilled.

## 2013-04-01 ENCOUNTER — Encounter: Payer: Medicare HMO | Attending: Family Medicine | Admitting: *Deleted

## 2013-04-01 VITALS — Ht 63.0 in | Wt 255.1 lb

## 2013-04-01 DIAGNOSIS — Z9641 Presence of insulin pump (external) (internal): Secondary | ICD-10-CM | POA: Insufficient documentation

## 2013-04-01 DIAGNOSIS — IMO0001 Reserved for inherently not codable concepts without codable children: Secondary | ICD-10-CM

## 2013-04-01 DIAGNOSIS — E119 Type 2 diabetes mellitus without complications: Secondary | ICD-10-CM | POA: Insufficient documentation

## 2013-04-01 DIAGNOSIS — Z713 Dietary counseling and surveillance: Secondary | ICD-10-CM | POA: Insufficient documentation

## 2013-04-01 NOTE — Progress Notes (Signed)
  Medical Nutrition Therapy:  Appt start time: 1700 end time:  1800.  Assessment:  Primary concerns today: continued diabetes education and information on insulin pump. 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 She and her fiancee shop together, he does all of the cooking. She states she SMBG 8 times a day and is very motivated to keep Diabetes in good control.  MEDICATIONS: see list. Diabetes medication includes Lantus 18 units in PM,  Novolog set doses pre-meal with a sliding scale of 1/50 above a target of 180 mg/dl.   DIETARY INTAKE:  Usual eating pattern includes 3 meals and 1-3 snacks per day.  Everyday foods include good variety of all food groups.  Avoided foods include mushy foods like yogurt, peas, beans, ground beef etc.    24-hr recall:  B ( AM): 1 egg, cheese, 2 bacon, occasionally Eggo waffle or biscuit, diet Pepsi  Snk ( AM): not usually, [employer] or Breakfast On the Go  L ( PM): left overs with meat, starch, salad, diet Soda Snk ( PM): not usually, 1/2 cup ice cream etc D ( PM): meat, starch, vegetable or salad, diet or water Snk ( PM): ice cream, popcorn, etc Beverages: water, diet soda  Usual physical activity: various activities including Zumba, water aerobics, walking, weight lifting, etc  Estimated energy needs: not determined at this visit.  Progress Towards Goal(s):  In progress.   Nutritional Diagnosis:  NB-1.1 Food and nutrition-related knowledge deficit As related to diabetes management.  As evidenced by A1c of 10.4%.    Intervention:  Nutrition counseling and diabetes education initiated. Commended her on her SMBG frequency and rationale of checking BG at alternate times of day, and A1c. Reviewed Carb Counting and reading food labels, and benefits of increased activity. Discussed introduction to insulin pump therapy including increased accuracy of delivery and flexibility of lifestyle available. Patient provided  permission for me to contact [employer] on her behalf to start the pump purchase process. I emailed the [employer] Rep as requested.. Handouts given during visit include: [employer] Pump Packet Carb Counting and Food Label handouts Meal Plan Card  Monitoring/Evaluation:  Dietary intake, exercise, web site for [employer] insulin pump, and body weight prn. Asked patient to notify me when pump is shipped so training appointment can be scheduled.

## 2013-04-03 ENCOUNTER — Telehealth: Payer: Self-pay | Admitting: Family Medicine

## 2013-04-03 MED ORDER — OXYCODONE HCL 10 MG PO TABS: 10.0000 mg | ORAL_TABLET | Freq: Three times a day (TID) | ORAL | Status: DC

## 2013-04-03 NOTE — Telephone Encounter (Signed)
Pt aware to pick up

## 2013-04-03 NOTE — Telephone Encounter (Signed)
Ok to refill 

## 2013-04-03 NOTE — Telephone Encounter (Signed)
Ok to refill, she can pick up.

## 2013-04-09 ENCOUNTER — Encounter: Payer: Self-pay | Admitting: *Deleted

## 2013-04-13 ENCOUNTER — Encounter (HOSPITAL_COMMUNITY): Payer: Self-pay | Admitting: Emergency Medicine

## 2013-04-13 ENCOUNTER — Emergency Department (HOSPITAL_COMMUNITY)
Admission: EM | Admit: 2013-04-13 | Discharge: 2013-04-13 | Disposition: A | Discharge status: Home / Self Care | Payer: Medicare HMO | Attending: Emergency Medicine | Admitting: Emergency Medicine

## 2013-04-13 ENCOUNTER — Emergency Department (HOSPITAL_COMMUNITY): Payer: Medicare HMO

## 2013-04-13 DIAGNOSIS — Z940 Kidney transplant status: Secondary | ICD-10-CM | POA: Insufficient documentation

## 2013-04-13 DIAGNOSIS — Z87448 Personal history of other diseases of urinary system: Secondary | ICD-10-CM | POA: Insufficient documentation

## 2013-04-13 DIAGNOSIS — R739 Hyperglycemia, unspecified: Secondary | ICD-10-CM

## 2013-04-13 DIAGNOSIS — R05 Cough: Secondary | ICD-10-CM | POA: Insufficient documentation

## 2013-04-13 DIAGNOSIS — IMO0001 Reserved for inherently not codable concepts without codable children: Secondary | ICD-10-CM | POA: Insufficient documentation

## 2013-04-13 DIAGNOSIS — R079 Chest pain, unspecified: Secondary | ICD-10-CM | POA: Insufficient documentation

## 2013-04-13 DIAGNOSIS — Z794 Long term (current) use of insulin: Secondary | ICD-10-CM | POA: Insufficient documentation

## 2013-04-13 DIAGNOSIS — Z79899 Other long term (current) drug therapy: Secondary | ICD-10-CM | POA: Insufficient documentation

## 2013-04-13 DIAGNOSIS — Z87440 Personal history of urinary (tract) infections: Secondary | ICD-10-CM | POA: Insufficient documentation

## 2013-04-13 DIAGNOSIS — J3489 Other specified disorders of nose and nasal sinuses: Secondary | ICD-10-CM | POA: Insufficient documentation

## 2013-04-13 DIAGNOSIS — N179 Acute kidney failure, unspecified: Secondary | ICD-10-CM

## 2013-04-13 DIAGNOSIS — J441 Chronic obstructive pulmonary disease with (acute) exacerbation: Secondary | ICD-10-CM | POA: Insufficient documentation

## 2013-04-13 DIAGNOSIS — J069 Acute upper respiratory infection, unspecified: Secondary | ICD-10-CM

## 2013-04-13 DIAGNOSIS — N189 Chronic kidney disease, unspecified: Secondary | ICD-10-CM

## 2013-04-13 DIAGNOSIS — J029 Acute pharyngitis, unspecified: Secondary | ICD-10-CM | POA: Insufficient documentation

## 2013-04-13 DIAGNOSIS — R059 Cough, unspecified: Secondary | ICD-10-CM | POA: Insufficient documentation

## 2013-04-13 DIAGNOSIS — R0982 Postnasal drip: Secondary | ICD-10-CM | POA: Insufficient documentation

## 2013-04-13 DIAGNOSIS — E1169 Type 2 diabetes mellitus with other specified complication: Secondary | ICD-10-CM | POA: Insufficient documentation

## 2013-04-13 DIAGNOSIS — H9209 Otalgia, unspecified ear: Secondary | ICD-10-CM | POA: Insufficient documentation

## 2013-04-13 DIAGNOSIS — F411 Generalized anxiety disorder: Secondary | ICD-10-CM | POA: Insufficient documentation

## 2013-04-13 DIAGNOSIS — E089 Diabetes mellitus due to underlying condition without complications: Secondary | ICD-10-CM

## 2013-04-13 DIAGNOSIS — J209 Acute bronchitis, unspecified: Secondary | ICD-10-CM

## 2013-04-13 DIAGNOSIS — D649 Anemia, unspecified: Secondary | ICD-10-CM | POA: Insufficient documentation

## 2013-04-13 DIAGNOSIS — J45901 Unspecified asthma with (acute) exacerbation: Secondary | ICD-10-CM | POA: Insufficient documentation

## 2013-04-13 LAB — CBC WITH DIFFERENTIAL/PLATELET
Basophils Absolute: 0 10*3/uL (ref 0.0–0.1)
Basophils Relative: 0 % (ref 0–1)
Eosinophils Absolute: 0 10*3/uL (ref 0.0–0.7)
Eosinophils Relative: 0 % (ref 0–5)
HCT: 37 % (ref 36.0–46.0)
Hemoglobin: 12.2 g/dL (ref 12.0–15.0)
Lymphocytes Relative: 18 % (ref 12–46)
Lymphs Abs: 2.1 10*3/uL (ref 0.7–4.0)
MCH: 26.2 pg (ref 26.0–34.0)
MCHC: 33 g/dL (ref 30.0–36.0)
MCV: 79.4 fL (ref 78.0–100.0)
Monocytes Absolute: 0.8 10*3/uL (ref 0.1–1.0)
Monocytes Relative: 7 % (ref 3–12)
Neutro Abs: 9 10*3/uL — ABNORMAL HIGH (ref 1.7–7.7)
Neutrophils Relative %: 76 % (ref 43–77)
Platelets: 277 10*3/uL (ref 150–400)
RBC: 4.66 MIL/uL (ref 3.87–5.11)
RDW: 13.8 % (ref 11.5–15.5)
WBC: 11.9 10*3/uL — ABNORMAL HIGH (ref 4.0–10.5)

## 2013-04-13 LAB — COMPREHENSIVE METABOLIC PANEL
ALT: 9 U/L (ref 0–35)
AST: 9 U/L (ref 0–37)
Albumin: 3.8 g/dL (ref 3.5–5.2)
Alkaline Phosphatase: 94 U/L (ref 39–117)
BUN: 19 mg/dL (ref 6–23)
CO2: 23 mEq/L (ref 19–32)
Calcium: 8.8 mg/dL (ref 8.4–10.5)
Chloride: 98 mEq/L (ref 96–112)
Creatinine, Ser: 1.49 mg/dL — ABNORMAL HIGH (ref 0.50–1.10)
GFR calc Af Amer: 50 mL/min — ABNORMAL LOW (ref >90)
GFR calc non Af Amer: 44 mL/min — ABNORMAL LOW (ref >90)
Glucose, Bld: 276 mg/dL — ABNORMAL HIGH (ref 70–99)
Potassium: 3.7 mEq/L (ref 3.5–5.1)
Sodium: 132 mEq/L — ABNORMAL LOW (ref 135–145)
Total Bilirubin: 0.2 mg/dL — ABNORMAL LOW (ref 0.3–1.2)
Total Protein: 7.8 g/dL (ref 6.0–8.3)

## 2013-04-13 LAB — POCT I-STAT TROPONIN I: Troponin i, poc: 0.01 ng/mL (ref 0.00–0.08)

## 2013-04-13 MED ORDER — HYDROCOD POLST-CHLORPHEN POLST 10-8 MG/5ML PO LQCR: 5.0000 mL | Freq: Two times a day (BID) | ORAL | Status: DC | PRN

## 2013-04-13 MED ORDER — AZITHROMYCIN 250 MG PO TABS: ORAL_TABLET | ORAL | Status: DC

## 2013-04-13 NOTE — ED Provider Notes (Signed)
History     CSN: [id number]  Arrival date & time 04/13/13  0246   First MD Initiated Contact with Patient 04/13/13 [phone]      Chief Complaint  Patient presents with  . Chest Pain  . Generalized Body Aches    (Consider location/radiation/quality/duration/timing/severity/associated sxs/prior treatment) HPI  Patient is a 37 yo woman who is s/p cadaver kidney transplant in 2011. She presents with complaints of general malaise, myalgias, minimally productive cough, sore throat and right sided ear ache.   Sx began approximately 20h ago. Pt denies fever although notes that she has not checked her temp at home. She denies SOB. Denies wheezing. Patient endorses pain in the sides of her chest bilaterally with coughing but, only with coughing. Pt notes nasal congestion, runny nose and post nasal drip.   Ear ache is mild to moderately severe and throbbing. Nonradiating. No drainage. No tinnintus or changes in hearing. PO intake and appetite have been diminished today. However, intake of fluids has been wnl.   Patient notes that she was perscribed [facility] Magic Mouth wash for thrush a few days ago.   Notes that she was hospitalized earlier in the year for UTI but denies any GU sx recently.     Past Medical History  Diagnosis Date  . Asthma   . Chronic anemia   . Panic disorder   . Crohn's disease   . Complication of anesthesia     does not do well with sedation  . Diabetes mellitus   . Anxiety     OCD  . Renal disorder     Past Surgical History  Procedure Laterality Date  . Nephrectomy transplanted organ    . Parathyroidectomy    . Tubal ligation    . Knee surgery    . Abdominal hysterectomy  02/13/2012    Procedure: HYSTERECTOMY ABDOMINAL;  Surgeon: Cyril Mourning, MD;  Location: [facility];  Service: Gynecology;  Laterality: N/A;  . Cystoscopy  02/13/2012    Procedure: CYSTOSCOPY;  Surgeon: Franchot Gallo, MD;  Location: [facility];  Service: Urology;  Laterality: N/A;  insertion  of ureteral catheter , removed per Dr Diona Fanti     No family history on file.  History  Substance Use Topics  . Smoking status: Never Smoker   . Smokeless tobacco: Never Used  . Alcohol Use: No    OB History   Grav Para Term Preterm Abortions TAB SAB Ect Mult Living                  Review of Systems Gen: no weight loss, fevers, chills, night sweats Eyes: no discharge or drainage, no occular pain or visual changes Nose: As per history of present illness, otherwise negative Mouth: As per history of present illness, otherwise negative  Neck: no neck pain Lungs:  As per history of present illness, otherwise negative CV: no chest pain, palpitations, dependent edema or orthopnea Abd: no abdominal pain, nausea, vomiting GU: no dysuria or gross hematuria MSK: no myalgias or arthralgias Neuro: no headache, no focal neurologic deficits Skin: no rash Psyche: negative. Allergies  Barium-containing compounds; Dilaudid; Hydromorphone; Ivp dye; Shellfish allergy; Vancomycin; Adhesive; Iron dextran; Sulfa antibiotics; and Iopamidol  Home Medications   Current Outpatient Rx  Name  Route  Sig  Dispense  Refill  . albuterol (PROVENTIL HFA;VENTOLIN HFA) 108 (90 BASE) MCG/ACT inhaler   Inhalation   Inhale 2 puffs into the lungs every 6 (six) hours as needed. For shortness of breath          .  albuterol (PROVENTIL) (2.5 MG/3ML) 0.083% nebulizer solution   Nebulization   Take 2.5 mg by nebulization every 6 (six) hours as needed. For shortness of breath         . ALPRAZolam (XANAX) 1 MG tablet   Oral   Take 1 mg by mouth 3 (three) times daily as needed for anxiety. For anxiety         . atorvastatin (LIPITOR) 40 MG tablet   Oral   Take 20 mg by mouth at bedtime.          . calcitRIOL (ROCALTROL) 0.25 MCG capsule   Oral   Take 1.25 mcg by mouth daily.          . calcium carbonate (OS-CAL - DOSED IN MG OF ELEMENTAL CALCIUM) 1250 MG tablet   Oral   Take 1 tablet by mouth  3 (three) times daily.         . cetirizine (ZYRTEC) 10 MG tablet   Oral   Take 10 mg by mouth every evening.          . cilostazol (PLETAL) 50 MG tablet   Oral   Take 50 mg by mouth 2 (two) times daily.         . insulin aspart (NOVOLOG) 100 UNIT/ML injection   Subcutaneous   Inject 20-30 Units into the skin 3 (three) times daily before meals. Inject 20 units SQ with breakfast and lunch, then 30 units daily SQ with dinner         . insulin glargine (LANTUS) 100 UNIT/ML injection   Subcutaneous   Inject 18 Units into the skin every morning.          . Insulin Pen Needle (NOVOFINE AUTOCOVER) 30G X 8 MM MISC   Subcutaneous   Inject 10 each into the skin as needed.   100 each   11   . mycophenolate (MYFORTIC) 180 MG EC tablet   Oral   Take 360 mg by mouth 2 (two) times daily.          Marland Kitchen omeprazole (PRILOSEC) 40 MG capsule   Oral   Take 40 mg by mouth 3 (three) times daily.         . Oxycodone HCl 10 MG TABS   Oral   Take 1 tablet (10 mg total) by mouth 3 (three) times daily. For pain   90 tablet   0   . pantoprazole (PROTONIX) 40 MG tablet   Oral   Take 40 mg by mouth 2 (two) times daily.          . predniSONE (DELTASONE) 10 MG tablet   Oral   Take 10 mg by mouth daily.         . promethazine (PHENERGAN) 25 MG tablet   Oral   Take 25-50 mg by mouth every 6 (six) hours as needed. For nausea         . sertraline (ZOLOFT) 100 MG tablet   Oral   Take 100 mg by mouth daily.          . tacrolimus (PROGRAF) 0.5 MG capsule   Oral   Take 0.5 mg by mouth 2 (two) times daily. Take with 1mg  capsules for total of 3.5mg  total dose twice daily         . tacrolimus (PROGRAF) 1 MG capsule   Oral   Take 3 mg by mouth 2 (two) times daily. Along with 0.5mg  capsule for total of 3.5mg  dose twice daily  BP 120/76  Pulse 94  Temp(Src) 98 F (36.7 C) (Oral)  Resp 16  SpO2 96%  LMP 01/30/2012  Physical Exam Gen: well developed and well  nourished appearing, sleeping, easily arousable, in no acute distress Head: NCAT Eyes: PERL, EOMI Nose: no epistaixis or rhinorrhea Mouth/throat: mucosa is moist and pink, posterior oropharynx is normal in appearance without signs of inflammation, tonsils are normal without signs of infection. The uvula is midline. Neck: supple, no stridor no lymphadenopathy Lungs: CTA B, no wheezing, rhonchi or rales respiratory rate is 20 Heart: Regular rate and rhythm, no murmur, extremities appear well perfused Abd: soft, obese notender, nondistended Back: no ttp, no cva ttp Skin: no rashese, wnl Ext: no clubbing, cyanosis or edema.  Neuro: CN ii-xii grossly intact, no focal deficits Psyche; normal affect,  calm and cooperative.  ED Course  Procedures (including critical care time)  Results for orders placed during the hospital encounter of 04/13/13 (from the past 24 hour(s))  CBC WITH DIFFERENTIAL     Status: Abnormal   Collection Time    04/13/13  3:37 AM      Result Value Range   WBC 11.9 (*) 4.0 - 10.5 K/uL   RBC 4.66  3.87 - 5.11 MIL/uL   Hemoglobin 12.2  12.0 - 15.0 g/dL   HCT 37.0  36.0 - 46.0 %   MCV 79.4  78.0 - 100.0 fL   MCH 26.2  26.0 - 34.0 pg   MCHC 33.0  30.0 - 36.0 g/dL   RDW 13.8  11.5 - 15.5 %   Platelets 277  150 - 400 K/uL   Neutrophils Relative 76  43 - 77 %   Neutro Abs 9.0 (*) 1.7 - 7.7 K/uL   Lymphocytes Relative 18  12 - 46 %   Lymphs Abs 2.1  0.7 - 4.0 K/uL   Monocytes Relative 7  3 - 12 %   Monocytes Absolute 0.8  0.1 - 1.0 K/uL   Eosinophils Relative 0  0 - 5 %   Eosinophils Absolute 0.0  0.0 - 0.7 K/uL   Basophils Relative 0  0 - 1 %   Basophils Absolute 0.0  0.0 - 0.1 K/uL  COMPREHENSIVE METABOLIC PANEL     Status: Abnormal   Collection Time    04/13/13  3:37 AM      Result Value Range   Sodium 132 (*) 135 - 145 mEq/L   Potassium 3.7  3.5 - 5.1 mEq/L   Chloride 98  96 - 112 mEq/L   CO2 23  19 - 32 mEq/L   Glucose, Bld 276 (*) 70 - 99 mg/dL   BUN 19   6 - 23 mg/dL   Creatinine, Ser 1.49 (*) 0.50 - 1.10 mg/dL   Calcium 8.8  8.4 - 10.5 mg/dL   Total Protein 7.8  6.0 - 8.3 g/dL   Albumin 3.8  3.5 - 5.2 g/dL   AST 9  0 - 37 U/L   ALT 9  0 - 35 U/L   Alkaline Phosphatase 94  39 - 117 U/L   Total Bilirubin 0.2 (*) 0.3 - 1.2 mg/dL   GFR calc non Af Amer 44 (*) >90 mL/min   GFR calc Af Amer 50 (*) >90 mL/min  POCT I-STAT TROPONIN I     Status: None   Collection Time    04/13/13  3:54 AM      Result Value Range   Troponin i, poc 0.01  0.00 - 0.08 ng/mL  Comment 3             Dg Chest 2 View  04/13/2013  *RADIOLOGY REPORT*  Clinical Data: Chest pain, body aches  CHEST - 2 VIEW  Comparison: 11/21/2012  Findings: Surgical clips project over the right lung apex/neck. Cardiomediastinal contours are unchanged, heart size upper normal. Mild central peribronchial thickening.  Mild right hemidiaphragm elevation. No confluent airspace opacity.  No pleural effusion or pneumothorax.  No acute osseous finding.  IMPRESSION: Mild central peribronchial thickening may reflect sequelae of asthma versus edema or bronchitis. No focal consolidation.   Original Report Authenticated By: Carlos Levering, M.D.      No diagnosis found.    MDM  I have reviewed lab results and results of CXR with [name] who states that she is prone to frequent episodes of PNA. In light of this and her immunocompromised status, we will tx bronchitis with zithromax as well as cough supressant.   Patient counseled to return promptly to the ED in the event of fever, shortness of breath, chest pain or any other urgent health concerns. She will call Dr. Samella Parr office during business hours today to schedule close outpatient f/u.         Elyn Peers, MD 04/13/13 [phone]

## 2013-04-13 NOTE — Discharge Instructions (Signed)

## 2013-04-13 NOTE — ED Notes (Signed)
Pt refused to wear hospital gown.

## 2013-04-13 NOTE — ED Notes (Signed)
Patient transported to X-ray 

## 2013-04-13 NOTE — ED Notes (Signed)
PT. REPORTS LEFT LATERAL CHEST PAIN /GENERALIZED BODY ACHES WITH OCCASIONAL DRY COUGH ONSET LAST NIGHT.

## 2013-04-23 ENCOUNTER — Other Ambulatory Visit: Payer: Self-pay | Admitting: Family Medicine

## 2013-04-23 DIAGNOSIS — IMO0001 Reserved for inherently not codable concepts without codable children: Secondary | ICD-10-CM

## 2013-05-01 ENCOUNTER — Encounter (HOSPITAL_COMMUNITY): Payer: Self-pay | Admitting: *Deleted

## 2013-05-01 ENCOUNTER — Ambulatory Visit (HOSPITAL_COMMUNITY)
Admission: AD | Admit: 2013-05-01 | Discharge: 2013-05-01 | Disposition: A | Discharge status: Home / Self Care | Payer: Medicare HMO | Attending: Psychiatry | Admitting: Psychiatry

## 2013-05-01 ENCOUNTER — Encounter (HOSPITAL_COMMUNITY): Payer: Self-pay | Admitting: Emergency Medicine

## 2013-05-01 ENCOUNTER — Emergency Department (HOSPITAL_COMMUNITY)
Admission: EM | Admit: 2013-05-01 | Discharge: 2013-05-01 | Disposition: A | Discharge status: Home / Self Care | Payer: Medicare HMO | Source: Home / Self Care | Attending: Emergency Medicine | Admitting: Emergency Medicine

## 2013-05-01 DIAGNOSIS — T148XXA Other injury of unspecified body region, initial encounter: Secondary | ICD-10-CM

## 2013-05-01 DIAGNOSIS — F329 Major depressive disorder, single episode, unspecified: Secondary | ICD-10-CM

## 2013-05-01 DIAGNOSIS — F429 Obsessive-compulsive disorder, unspecified: Secondary | ICD-10-CM | POA: Diagnosis not present

## 2013-05-01 DIAGNOSIS — F32A Depression, unspecified: Secondary | ICD-10-CM

## 2013-05-01 HISTORY — DX: Other injury of unspecified body region, initial encounter: T14.8XXA

## 2013-05-01 LAB — CBC WITH DIFFERENTIAL/PLATELET
Basophils Absolute: 0 10*3/uL (ref 0.0–0.1)
Basophils Relative: 0 % (ref 0–1)
Eosinophils Absolute: 0 10*3/uL (ref 0.0–0.7)
Eosinophils Relative: 0 % (ref 0–5)
HCT: 38.5 % (ref 36.0–46.0)
Hemoglobin: 12.5 g/dL (ref 12.0–15.0)
Lymphocytes Relative: 18 % (ref 12–46)
Lymphs Abs: 2.3 10*3/uL (ref 0.7–4.0)
MCH: 26.8 pg (ref 26.0–34.0)
MCHC: 32.5 g/dL (ref 30.0–36.0)
MCV: 82.4 fL (ref 78.0–100.0)
Monocytes Absolute: 0.7 10*3/uL (ref 0.1–1.0)
Monocytes Relative: 6 % (ref 3–12)
Neutro Abs: 9.5 10*3/uL — ABNORMAL HIGH (ref 1.7–7.7)
Neutrophils Relative %: 76 % (ref 43–77)
Platelets: 330 10*3/uL (ref 150–400)
RBC: 4.67 MIL/uL (ref 3.87–5.11)
RDW: 14 % (ref 11.5–15.5)
WBC: 12.5 10*3/uL — ABNORMAL HIGH (ref 4.0–10.5)

## 2013-05-01 LAB — RAPID URINE DRUG SCREEN, HOSP PERFORMED
Amphetamines: NOT DETECTED
Barbiturates: NOT DETECTED
Benzodiazepines: NOT DETECTED
Cocaine: NOT DETECTED
Opiates: NOT DETECTED
Tetrahydrocannabinol: NOT DETECTED

## 2013-05-01 LAB — URINALYSIS, ROUTINE W REFLEX MICROSCOPIC
Bilirubin Urine: NEGATIVE
Glucose, UA: 500 mg/dL — AB
Hgb urine dipstick: NEGATIVE
Ketones, ur: NEGATIVE mg/dL
Leukocytes, UA: NEGATIVE
Nitrite: NEGATIVE
Protein, ur: NEGATIVE mg/dL
Specific Gravity, Urine: 1.02 (ref 1.005–1.030)
Urobilinogen, UA: 0.2 mg/dL (ref 0.0–1.0)
pH: 5.5 (ref 5.0–8.0)

## 2013-05-01 LAB — BASIC METABOLIC PANEL
BUN: 26 mg/dL — ABNORMAL HIGH (ref 6–23)
CO2: 23 mEq/L (ref 19–32)
Calcium: 9.9 mg/dL (ref 8.4–10.5)
Chloride: 101 mEq/L (ref 96–112)
Creatinine, Ser: 1.66 mg/dL — ABNORMAL HIGH (ref 0.50–1.10)
GFR calc Af Amer: 44 mL/min — ABNORMAL LOW (ref >90)
GFR calc non Af Amer: 38 mL/min — ABNORMAL LOW (ref >90)
Glucose, Bld: 208 mg/dL — ABNORMAL HIGH (ref 70–99)
Potassium: 4.1 mEq/L (ref 3.5–5.1)
Sodium: 138 mEq/L (ref 135–145)

## 2013-05-01 LAB — ETHANOL: Alcohol, Ethyl (B): <11 mg/dL (ref 0–11)

## 2013-05-01 NOTE — ED Notes (Addendum)
Pt states that she will not change into the blue paper scrubs even though it was explained that this was hospital policy and for safety of herself.  RN K. Kae Heller informed and Camera operator J. Mayhall informed.

## 2013-05-01 NOTE — ED Notes (Signed)
Went into the room - the patient is resistant to getting dressed in the scrubs, The patient is talking with Collier Salina the PA  - states that she does not need to be put in the scrubs
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
## 2013-05-01 NOTE — ED Notes (Signed)
Patient no longer wants to be admitted to [place] aware and PA to d/c the patient to home

## 2013-05-01 NOTE — ED Provider Notes (Signed)
History     CSN: [id number]  Arrival date & time 05/01/13  1956   First MD Initiated Contact with Patient 05/01/13 2010      Chief Complaint  Patient presents with  . Medical Clearance   HPI  History provided by the patient. Patient is a 37 year old female status post cadaver transplant in 2011 who presents from [facility] for medical clearance. Patient with increased depression was recommended by her therapist to go to [facility] and she was subsequently sent here for medical clearance. Patient denies any SI or HI but has significant increased depression and feels overwhelmed. Patient otherwise has no complaints. She was seen several weeks ago for some cough and cold symptoms and was treated for bronchitis. She has felt improved since that time.  Denies any fever, chills, or sweats.  No urinary changes.  Normal urine output. No dysuria, frequency or hematuria.    Past Medical History  Diagnosis Date  . Asthma   . Chronic anemia   . Panic disorder   . Crohn's disease   . Complication of anesthesia     does not do well with sedation  . Anxiety     OCD  . Renal disorder   . 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 Fracture 05/01/2013    Right foot, in cast    Past Surgical History  Procedure Laterality Date  . Nephrectomy transplanted organ    . Parathyroidectomy    . Tubal ligation    . Knee surgery    . Abdominal hysterectomy  02/13/2012    Procedure: HYSTERECTOMY ABDOMINAL;  Surgeon: Cyril Mourning, MD;  Location: [facility];  Service: Gynecology;  Laterality: N/A;  . Cystoscopy  02/13/2012    Procedure: CYSTOSCOPY;  Surgeon: Franchot Gallo, MD;  Location: [facility];  Service: Urology;  Laterality: N/A;  insertion of ureteral catheter , removed per Dr Diona Fanti     No family history on file.  History  Substance Use Topics  . Smoking status: Never Smoker   . Smokeless tobacco: Never Used  . Alcohol Use: No    OB History   Grav Para Term Preterm Abortions TAB SAB Ect Mult Living                Review of Systems  Constitutional: Negative for fever and chills.  Respiratory: Negative for cough and shortness of breath.   Cardiovascular: Negative for chest pain and leg swelling.  Gastrointestinal: Negative for nausea and vomiting.  Psychiatric/Behavioral: Negative for suicidal ideas.  All other systems reviewed and are negative.    Allergies  Barium-containing compounds; Dilaudid; Hydromorphone; Ivp dye; Shellfish allergy; Vancomycin; Adhesive; Infed; Sulfa antibiotics; and Iopamidol  Home Medications   Current Outpatient Rx  Name  Route  Sig  Dispense  Refill  . albuterol (PROVENTIL HFA;VENTOLIN HFA) 108 (90 BASE) MCG/ACT inhaler   Inhalation   Inhale 2 puffs into the lungs every 6 (six) hours as needed. For shortness of breath          . albuterol (PROVENTIL) (2.5 MG/3ML) 0.083% nebulizer solution   Nebulization   Take 2.5 mg by nebulization every 6 (six) hours as needed. For shortness of breath         . ALPRAZolam (XANAX) 1 MG tablet   Oral   Take 1 mg by mouth 3 (three) times daily as needed for anxiety. For anxiety         . atorvastatin (LIPITOR) 40 MG tablet   Oral  Take 20 mg by mouth at bedtime.          Marland Kitchen azithromycin (ZITHROMAX Z-PAK) 250 MG tablet      Take as directed.   6 each   0   . busPIRone (BUSPAR) 15 MG tablet   Oral   Take 15 mg by mouth 3 (three) times daily.         . calcitRIOL (ROCALTROL) 0.25 MCG capsule   Oral   Take 1.25 mcg by mouth daily.          . calcium carbonate (OS-CAL - DOSED IN MG OF ELEMENTAL CALCIUM) 1250 MG tablet   Oral   Take 1 tablet by mouth 3 (three) times daily.         . cetirizine (ZYRTEC) 10 MG tablet   Oral   Take 10 mg by mouth every evening.          . chlorpheniramine-HYDROcodone (TUSSIONEX PENNKINETIC ER) 10-8 MG/5ML LQCR   Oral   Take 5 mLs by mouth every 12 (twelve) hours as needed.   140 mL   0   . cilostazol (PLETAL) 50 MG tablet   Oral   Take 50 mg by  mouth 2 (two) times daily.         . insulin aspart (NOVOLOG) 100 UNIT/ML injection   Subcutaneous   Inject 20-30 Units into the skin 3 (three) times daily before meals. Inject 20 units SQ with breakfast and lunch, then 30 units daily SQ with dinner         . insulin glargine (LANTUS) 100 UNIT/ML injection   Subcutaneous   Inject 18 Units into the skin every morning.          . Insulin Pen Needle (NOVOFINE AUTOCOVER) 30G X 8 MM MISC   Subcutaneous   Inject 10 each into the skin as needed.   100 each   11   . loratadine (CLARITIN) 10 MG tablet   Oral   Take 10 mg by mouth daily.         . mycophenolate (MYFORTIC) 180 MG EC tablet   Oral   Take 360 mg by mouth 2 (two) times daily.          Marland Kitchen omeprazole (PRILOSEC) 40 MG capsule   Oral   Take 40 mg by mouth 3 (three) times daily.         . Oxycodone HCl 10 MG TABS   Oral   Take 1 tablet (10 mg total) by mouth 3 (three) times daily. For pain   90 tablet   0   . pantoprazole (PROTONIX) 40 MG tablet   Oral   Take 40 mg by mouth 2 (two) times daily.          . predniSONE (DELTASONE) 10 MG tablet   Oral   Take 10 mg by mouth daily.         . promethazine (PHENERGAN) 25 MG tablet   Oral   Take 25-50 mg by mouth every 6 (six) hours as needed. For nausea         . sertraline (ZOLOFT) 100 MG tablet   Oral   Take 100 mg by mouth daily.          . tacrolimus (PROGRAF) 0.5 MG capsule   Oral   Take 0.5 mg by mouth 2 (two) times daily. Take with 1mg  capsules for total of 3.5mg  total dose twice daily         . tacrolimus (PROGRAF)  1 MG capsule   Oral   Take 3 mg by mouth 2 (two) times daily. Along with 0.5mg  capsule for total of 3.5mg  dose twice daily         . tiotropium (SPIRIVA) 18 MCG inhalation capsule   Inhalation   Place 18 mcg into inhaler and inhale daily.           LMP 01/30/2012  Physical Exam  Nursing note and vitals reviewed. Constitutional: She is oriented to person, place, and  time. She appears well-developed and well-nourished. No distress.  HENT:  Head: Normocephalic and atraumatic.  Cardiovascular: Normal rate and regular rhythm.   Pulmonary/Chest: Effort normal and breath sounds normal. No respiratory distress. She has no wheezes. She has no rales.  Abdominal: Soft.  Musculoskeletal: She exhibits no edema.  Cast on right lower leg   Neurological: She is alert and oriented to person, place, and time.  Skin: Skin is warm and dry. No rash noted.  Psychiatric: Her behavior is normal. She exhibits a depressed mood.  Tearful    ED Course  Procedures   Results for orders placed during the hospital encounter of 05/01/13  CBC WITH DIFFERENTIAL      Result Value Range   WBC 12.5 (*) 4.0 - 10.5 K/uL   RBC 4.67  3.87 - 5.11 MIL/uL   Hemoglobin 12.5  12.0 - 15.0 g/dL   HCT 38.5  36.0 - 46.0 %   MCV 82.4  78.0 - 100.0 fL   MCH 26.8  26.0 - 34.0 pg   MCHC 32.5  30.0 - 36.0 g/dL   RDW 14.0  11.5 - 15.5 %   Platelets 330  150 - 400 K/uL   Neutrophils Relative 76  43 - 77 %   Neutro Abs 9.5 (*) 1.7 - 7.7 K/uL   Lymphocytes Relative 18  12 - 46 %   Lymphs Abs 2.3  0.7 - 4.0 K/uL   Monocytes Relative 6  3 - 12 %   Monocytes Absolute 0.7  0.1 - 1.0 K/uL   Eosinophils Relative 0  0 - 5 %   Eosinophils Absolute 0.0  0.0 - 0.7 K/uL   Basophils Relative 0  0 - 1 %   Basophils Absolute 0.0  0.0 - 0.1 K/uL  BASIC METABOLIC PANEL      Result Value Range   Sodium 138  135 - 145 mEq/L   Potassium 4.1  3.5 - 5.1 mEq/L   Chloride 101  96 - 112 mEq/L   CO2 23  19 - 32 mEq/L   Glucose, Bld 208 (*) 70 - 99 mg/dL   BUN 26 (*) 6 - 23 mg/dL   Creatinine, Ser 1.66 (*) 0.50 - 1.10 mg/dL   Calcium 9.9  8.4 - 10.5 mg/dL   GFR calc non Af Amer 38 (*) >90 mL/min   GFR calc Af Amer 44 (*) >90 mL/min  ETHANOL      Result Value Range   Alcohol, Ethyl (B) <11  0 - 11 mg/dL  URINE RAPID DRUG SCREEN (HOSP PERFORMED)      Result Value Range   Opiates NONE DETECTED  NONE DETECTED    Cocaine NONE DETECTED  NONE DETECTED   Benzodiazepines NONE DETECTED  NONE DETECTED   Amphetamines NONE DETECTED  NONE DETECTED   Tetrahydrocannabinol NONE DETECTED  NONE DETECTED   Barbiturates NONE DETECTED  NONE DETECTED  URINALYSIS, ROUTINE W REFLEX MICROSCOPIC      Result Value Range   Color,  Urine YELLOW  YELLOW   APPearance CLOUDY (*) CLEAR   Specific Gravity, Urine 1.020  1.005 - 1.030   pH 5.5  5.0 - 8.0   Glucose, UA 500 (*) NEGATIVE mg/dL   Hgb urine dipstick NEGATIVE  NEGATIVE   Bilirubin Urine NEGATIVE  NEGATIVE   Ketones, ur NEGATIVE  NEGATIVE mg/dL   Protein, ur NEGATIVE  NEGATIVE mg/dL   Urobilinogen, UA 0.2  0.0 - 1.0 mg/dL   Nitrite NEGATIVE  NEGATIVE   Leukocytes, UA NEGATIVE  NEGATIVE        1. Depression       MDM  8:15PM and evaluated. Patient sent from the [facility] for medical clearance. She has a bed waiting. Denies SI or HI. Patient is voluntary.  Patient reporting normal urinary output. Her BUN and creatinine are elevated but near her baseline is. They should continued to be monitored for any acute changes. She slightly elevated WBC likely result of chronic steroid use. She is afebrile without complaints. Similar elevated WBC on the recent past.      Martie Lee, PA-C 05/01/13 2258

## 2013-05-02 ENCOUNTER — Inpatient Hospital Stay (HOSPITAL_COMMUNITY)
Admission: AD | Admit: 2013-05-02 | Discharge: 2013-05-05 | DRG: 882 | Disposition: A | Discharge status: Home / Self Care | Payer: Medicare HMO | Attending: Psychiatry | Admitting: Psychiatry

## 2013-05-02 ENCOUNTER — Encounter (HOSPITAL_COMMUNITY): Payer: Self-pay

## 2013-05-02 DIAGNOSIS — F429 Obsessive-compulsive disorder, unspecified: Secondary | ICD-10-CM | POA: Diagnosis present

## 2013-05-02 DIAGNOSIS — F329 Major depressive disorder, single episode, unspecified: Secondary | ICD-10-CM

## 2013-05-02 DIAGNOSIS — F411 Generalized anxiety disorder: Secondary | ICD-10-CM | POA: Diagnosis present

## 2013-05-02 DIAGNOSIS — K5090 Crohn's disease, unspecified, without complications: Secondary | ICD-10-CM | POA: Diagnosis present

## 2013-05-02 DIAGNOSIS — Z79899 Other long term (current) drug therapy: Secondary | ICD-10-CM | POA: Diagnosis not present

## 2013-05-02 DIAGNOSIS — F4001 Agoraphobia with panic disorder: Secondary | ICD-10-CM | POA: Diagnosis present

## 2013-05-02 DIAGNOSIS — E119 Type 2 diabetes mellitus without complications: Secondary | ICD-10-CM | POA: Diagnosis present

## 2013-05-02 DIAGNOSIS — F32A Depression, unspecified: Secondary | ICD-10-CM | POA: Diagnosis present

## 2013-05-02 LAB — GLUCOSE, CAPILLARY: Glucose-Capillary: 417 mg/dL — ABNORMAL HIGH (ref 70–99)

## 2013-05-02 MED ORDER — MYCOPHENOLATE SODIUM 180 MG PO TBEC
360.0000 mg | DELAYED_RELEASE_TABLET | Freq: Two times a day (BID) | ORAL | Status: DC
  Administered 2013-05-02 – 2013-05-05 (×6): 360 mg via ORAL
  Filled 2013-05-02 (×8): qty 2

## 2013-05-02 MED ORDER — INSULIN ASPART 100 UNIT/ML ~~LOC~~ SOLN: 30.0000 [IU] | Freq: Every day | SUBCUTANEOUS | Status: DC

## 2013-05-02 MED ORDER — CALCITRIOL 0.5 MCG PO CAPS
1.2500 ug | ORAL_CAPSULE | Freq: Every day | ORAL | Status: DC
  Administered 2013-05-03 – 2013-05-05 (×3): 1.25 ug via ORAL
  Filled 2013-05-02 (×4): qty 1

## 2013-05-02 MED ORDER — PREDNISONE 10 MG PO TABS
10.0000 mg | ORAL_TABLET | Freq: Every day | ORAL | Status: DC
  Administered 2013-05-03 – 2013-05-05 (×3): 10 mg via ORAL
  Filled 2013-05-02 (×4): qty 1

## 2013-05-02 MED ORDER — CALCITRIOL 1 MCG/ML PO SOLN: 1.2500 ug | Freq: Every day | ORAL | Status: DC

## 2013-05-02 MED ORDER — CALCIUM CARBONATE ANTACID 500 MG PO CHEW
1.0000 | CHEWABLE_TABLET | Freq: Three times a day (TID) | ORAL | Status: DC
  Administered 2013-05-02 – 2013-05-05 (×9): 200 mg via ORAL
  Filled 2013-05-02 (×11): qty 1

## 2013-05-02 MED ORDER — MAGNESIUM HYDROXIDE 400 MG/5ML PO SUSP: 30.0000 mL | Freq: Every day | ORAL | Status: DC | PRN

## 2013-05-02 MED ORDER — TACROLIMUS 1 MG PO CAPS
3.5000 mg | ORAL_CAPSULE | Freq: Two times a day (BID) | ORAL | Status: DC
  Administered 2013-05-02 – 2013-05-05 (×6): 3.5 mg via ORAL
  Filled 2013-05-02 (×8): qty 1

## 2013-05-02 MED ORDER — HYDROXYZINE HCL 50 MG PO TABS
50.0000 mg | ORAL_TABLET | Freq: Every evening | ORAL | Status: DC | PRN
  Administered 2013-05-02 – 2013-05-04 (×4): 50 mg via ORAL
  Filled 2013-05-02 (×8): qty 1

## 2013-05-02 MED ORDER — CETIRIZINE HCL 10 MG PO TABS
10.0000 mg | ORAL_TABLET | Freq: Every day | ORAL | Status: DC
  Filled 2013-05-02: qty 1

## 2013-05-02 MED ORDER — INSULIN ASPART 100 UNIT/ML ~~LOC~~ SOLN
20.0000 [IU] | Freq: Three times a day (TID) | SUBCUTANEOUS | Status: DC
  Administered 2013-05-03 (×2): 20 [IU] via SUBCUTANEOUS
  Administered 2013-05-03: 17:00:00 via SUBCUTANEOUS
  Administered 2013-05-04 – 2013-05-05 (×4): 20 [IU] via SUBCUTANEOUS

## 2013-05-02 MED ORDER — OXYCODONE HCL 5 MG PO TABS: 10.0000 mg | ORAL_TABLET | Freq: Three times a day (TID) | ORAL | Status: DC | PRN

## 2013-05-02 MED ORDER — PANTOPRAZOLE SODIUM 40 MG PO TBEC
40.0000 mg | DELAYED_RELEASE_TABLET | Freq: Two times a day (BID) | ORAL | Status: DC
  Administered 2013-05-02 – 2013-05-05 (×6): 40 mg via ORAL
  Filled 2013-05-02 (×8): qty 1

## 2013-05-02 MED ORDER — LORATADINE 10 MG PO TABS
10.0000 mg | ORAL_TABLET | Freq: Every day | ORAL | Status: DC
  Administered 2013-05-02 – 2013-05-05 (×4): 10 mg via ORAL
  Filled 2013-05-02 (×4): qty 1

## 2013-05-02 MED ORDER — INSULIN GLARGINE 100 UNIT/ML ~~LOC~~ SOLN
22.0000 [IU] | SUBCUTANEOUS | Status: DC
  Administered 2013-05-03 – 2013-05-05 (×3): 22 [IU] via SUBCUTANEOUS

## 2013-05-02 MED ORDER — ACETAMINOPHEN 325 MG PO TABS: 650.0000 mg | ORAL_TABLET | Freq: Four times a day (QID) | ORAL | Status: DC | PRN

## 2013-05-02 MED ORDER — INSULIN ASPART 100 UNIT/ML ~~LOC~~ SOLN: 20.0000 [IU] | Freq: Two times a day (BID) | SUBCUTANEOUS | Status: DC

## 2013-05-02 MED ORDER — INSULIN ASPART 100 UNIT/ML ~~LOC~~ SOLN
1.0000 [IU] | Freq: Three times a day (TID) | SUBCUTANEOUS | Status: DC
  Administered 2013-05-02: 3 [IU] via SUBCUTANEOUS
  Administered 2013-05-03: 4 [IU] via SUBCUTANEOUS
  Administered 2013-05-03: 9 [IU] via SUBCUTANEOUS
  Administered 2013-05-04: 1 [IU] via SUBCUTANEOUS
  Administered 2013-05-04: 3 [IU] via SUBCUTANEOUS

## 2013-05-02 MED ORDER — CILOSTAZOL 50 MG PO TABS
50.0000 mg | ORAL_TABLET | Freq: Two times a day (BID) | ORAL | Status: DC
  Administered 2013-05-02 – 2013-05-03 (×3): 50 mg via ORAL
  Administered 2013-05-04: 18:00:00 via ORAL
  Administered 2013-05-04 – 2013-05-05 (×2): 50 mg via ORAL
  Filled 2013-05-02 (×8): qty 1

## 2013-05-02 MED ORDER — PROMETHAZINE HCL 25 MG PO TABS
50.0000 mg | ORAL_TABLET | Freq: Four times a day (QID) | ORAL | Status: DC | PRN
  Administered 2013-05-02 – 2013-05-04 (×2): 50 mg via ORAL
  Filled 2013-05-02 (×2): qty 2

## 2013-05-02 MED ORDER — CYCLOBENZAPRINE HCL 10 MG PO TABS: 5.0000 mg | ORAL_TABLET | Freq: Three times a day (TID) | ORAL | Status: DC | PRN

## 2013-05-02 MED ORDER — BUSPIRONE HCL 10 MG PO TABS
15.0000 mg | ORAL_TABLET | Freq: Four times a day (QID) | ORAL | Status: DC
  Administered 2013-05-02 – 2013-05-04 (×10): 15 mg via ORAL
  Filled 2013-05-02 (×16): qty 1

## 2013-05-02 MED ORDER — ATORVASTATIN CALCIUM 20 MG PO TABS
20.0000 mg | ORAL_TABLET | Freq: Every day | ORAL | Status: DC
  Administered 2013-05-02 – 2013-05-04 (×3): 20 mg via ORAL
  Filled 2013-05-02 (×4): qty 1

## 2013-05-02 MED ORDER — SERTRALINE HCL 50 MG PO TABS
250.0000 mg | ORAL_TABLET | Freq: Every day | ORAL | Status: DC
  Administered 2013-05-02 – 2013-05-04 (×3): 250 mg via ORAL
  Filled 2013-05-02 (×4): qty 1

## 2013-05-02 MED ORDER — METOCLOPRAMIDE HCL 5 MG PO TABS
5.0000 mg | ORAL_TABLET | Freq: Three times a day (TID) | ORAL | Status: DC
  Administered 2013-05-02 – 2013-05-05 (×12): 5 mg via ORAL
  Filled 2013-05-02 (×15): qty 1

## 2013-05-02 MED ORDER — LORATADINE 10 MG PO TABS
10.0000 mg | ORAL_TABLET | Freq: Two times a day (BID) | ORAL | Status: DC
  Filled 2013-05-02 (×2): qty 1

## 2013-05-02 MED ORDER — ALUM & MAG HYDROXIDE-SIMETH 200-200-20 MG/5ML PO SUSP: 30.0000 mL | ORAL | Status: DC | PRN

## 2013-05-02 MED ORDER — HYDROXYZINE HCL 25 MG PO TABS
25.0000 mg | ORAL_TABLET | Freq: Three times a day (TID) | ORAL | Status: DC
  Administered 2013-05-02 – 2013-05-03 (×3): 25 mg via ORAL
  Filled 2013-05-02 (×9): qty 1

## 2013-05-02 MED ORDER — FLUCONAZOLE 150 MG PO TABS
150.0000 mg | ORAL_TABLET | Freq: Once | ORAL | Status: AC
  Administered 2013-05-02: 150 mg via ORAL
  Filled 2013-05-02: qty 1

## 2013-05-02 MED ORDER — INSULIN ASPART 100 UNIT/ML ~~LOC~~ SOLN
35.0000 [IU] | Freq: Once | SUBCUTANEOUS | Status: AC
  Administered 2013-05-02: 35 [IU] via SUBCUTANEOUS

## 2013-05-02 NOTE — Progress Notes (Signed)
Admission Note  D) This is a 37 year old AAF who is admitted to the service of Dr. Einar Grad. Pt is obese and has an extensive physical history. Pt has had: Cervical cancer, breast cancer, has had two kidney transplants, parathyroidectomy, diabetes mellitus Insulin dependent, and a couple of weeks ago had a hairline fracture to her right foot and disregarded the pain and it broke in two places. Presently she has a cast on her foot and is using a wheelchair PRN. Pt states she is not feeling suicidal presently but presents with a flat affect and depressed mood. Has OCD and anxiety. Pt has to have at least 100 rolls of toilet paper in the house otherwise she cannot sleep. She also went out and bought another 300 rolls. Her OCD is centered around cleanliness and she gathers cleaning supplies or personal cleaning supplies. States the buying has gotten worse in the last 3 months. She also bought a car the other day due to the OCD that she doesn't really need. Six years ago the same pattern happened and Pt went out and bought a house. Is tearful on admission and needing reassurance and to know it is okay to ask for help. CBG on admission was 417. Pt also has difficulty being in groups of more than 5 adults. A) Given support, reassurance and time. Encouraged to attempt to attend the groups. Active listening provided and a calm gentle approach used with Patient. R) Denies SI and HI. Is being seen by the NP for her medications and insulin orders.

## 2013-05-02 NOTE — ED Provider Notes (Signed)
Medical screening examination/treatment/procedure(s) were performed by non-physician practitioner and as supervising physician I was immediately available for consultation/collaboration.   Julianne Rice, MD 05/02/13 1606

## 2013-05-02 NOTE — [facility] Group Notes (Signed)
[facility] Notes:  (Clinical Social Work)  05/02/2013   3:00-4:00PM  Summary of Progress/Problems:   The main focus of today's process group was for the patient to identify something in their life that led to their hospitalization that they would like to change, then to discuss their motivation to change.  The patients as a group were quite depressed and focused on missing Mother's Day with family.  The patient expressed that she wants to learn to be less passive.  She did not feel comfortable talking more about this, as this was her first group.  Type of Therapy:  Process Group  Participation Level:  Minimal  Participation Quality:  Attentive  Affect:  Blunted  Cognitive:  Oriented  Insight:  Limited  Engagement in Therapy:  Limited  Modes of Intervention:  Clarification, Support and Processing, Exploration, Discussion   Selmer Dominion, LCSW 05/02/2013, 5:29 PM
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
## 2013-05-02 NOTE — Progress Notes (Signed)
[facility] Notes:  (Nursing/MHT/Case Management/Adjunct)  Date:  05/02/2013  Time:  11:07 PM  Type of Therapy:  Group Therapy  Participation Level:  Active  Participation Quality:  Appropriate  Affect:  Appropriate  Cognitive:  Appropriate  Insight:  Appropriate  Engagement in Group:  Engaged  Modes of Intervention:  Socialization and Support  Summary of Progress/Problems: Pt. Was engaged in group and had good insight.  Pt. Stated traveling and spending time with family a coping skill.  Lanell Persons 05/02/2013, 11:07 PM
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
## 2013-05-02 NOTE — H&P (Signed)
Medical/psychiatric examination/treatment/procedure(s) were performed by non-physician practitioner and as supervising physician I was immediately available for consultation/collaboration. I agree with this note. I certify that inpatient services furnished can reasonably be expected to improve the patient's condition. Rudean Curt, MD, [facility]

## 2013-05-03 DIAGNOSIS — F411 Generalized anxiety disorder: Secondary | ICD-10-CM

## 2013-05-03 MED ORDER — HYDROXYZINE HCL 25 MG PO TABS
25.0000 mg | ORAL_TABLET | Freq: Two times a day (BID) | ORAL | Status: DC
  Administered 2013-05-03 – 2013-05-05 (×4): 25 mg via ORAL
  Filled 2013-05-03 (×6): qty 1

## 2013-05-03 MED ORDER — LORATADINE 10 MG PO TABS
10.0000 mg | ORAL_TABLET | Freq: Every day | ORAL | Status: DC
  Administered 2013-05-03 – 2013-05-04 (×2): 10 mg via ORAL
  Filled 2013-05-03 (×4): qty 1

## 2013-05-03 MED ORDER — HYDROCOD POLST-CHLORPHEN POLST 10-8 MG/5ML PO LQCR
5.0000 mL | Freq: Two times a day (BID) | ORAL | Status: DC | PRN
  Administered 2013-05-03: 5 mL via ORAL
  Filled 2013-05-03: qty 5

## 2013-05-03 NOTE — Progress Notes (Addendum)
Patient ID: [name], female   DOB: [birth year], 37 y.o.   MRN: [id number] 05-03-13 nursing shift note: D: pt has been cooperative/polite today. She has taken all her scheduled medications and has not requested any prn's. She did not attend group this am.  A: staff continues to support and encourage this pt. RN gave her advice regarding her diet, since she is a diabetic, advising her to be careful of her carb intake. R: she has denied any SI. On her inventory sheet she wrote slept well, appetite improving, energy low, attention good with her depression at 5 and her hopelessness at 1. She has had no complaints of pain. CBG's have been 98 at 1200 noon and 363 at 1700. RN will monitor and Q 15 min ck's continue.

## 2013-05-03 NOTE — Progress Notes (Signed)
[facility] MD Progress Note  05/03/2013 11:08 AM [name]  MRN:  [id number] Subjective:  6/10 depression but no suicidal ideations, 3/10 anxiety, sleep was fair, patient resting quietly in her room--she wanted her Ebook but explained the unit rules--did locate a pen for her use.  Her fiance is visiting this evening.  She feels her regular medications (insulin, kidney medications) are correct.  Encouraged her to come to the dayroom.  Diagnosis:   Axis I: Depressive Disorder NOS and Generalized Anxiety Disorder Axis II: Deferred Axis III:  Past Medical History  Diagnosis Date  . Chronic anemia   . Panic disorder   . Crohn's disease   . Complication of anesthesia     does not do well with sedation  . Anxiety     OCD  . Renal disorder   . 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 Fracture 05/01/2013    Right foot, in cast   Axis IV: other psychosocial or environmental problems, problems related to social environment and problems with primary support group Axis V: 41-50 serious symptoms  ADL's:  Intact  Sleep: Fair  Appetite:  Fair  Suicidal Ideation:  Denies Homicidal Ideation:  Denies  Psychiatric Specialty Exam: Review of Systems  Constitutional: Negative.   HENT: Negative.   Eyes: Negative.   Respiratory: Negative.   Cardiovascular: Negative.   Gastrointestinal: Negative.   Genitourinary: Negative.   Musculoskeletal: Positive for myalgias.  Skin: Negative.   Neurological: Negative.   Endo/Heme/Allergies: Negative.   Psychiatric/Behavioral: Positive for depression. The patient is nervous/anxious.     Blood pressure 113/73, pulse 102, temperature 98.4 F (36.9 C), temperature source Oral, resp. rate 16, height 5\' 3"  (1.6 m), last menstrual period 01/30/2012.There is no weight on file to calculate BMI.  General Appearance: Casual  Eye Contact::  Fair  Speech:  Slow  Volume:  Decreased  Mood:  Anxious and Depressed  Affect:  Congruent  Thought Process:  Coherent   Orientation:  Full (Time, Place, and Person)  Thought Content:  WDL  Suicidal Thoughts:  No  Homicidal Thoughts:  No  Memory:  Immediate;   Fair Recent;   Fair Remote;   Fair  Judgement:  Fair  Insight:  Fair  Psychomotor Activity:  Decreased  Concentration:  Fair  Recall:  Fair  Akathisia:  No  Handed:  Right  AIMS (if indicated):     Assets:  Communication Skills Resilience  Sleep:  Number of Hours: 6.75   Current Medications: Current Facility-Administered Medications  Medication Dose Route Frequency Provider Last Rate Last Dose  . acetaminophen (TYLENOL) tablet 650 mg  650 mg Oral Q6H PRN Waylan Boga, NP      . alum & mag hydroxide-simeth (MAALOX/MYLANTA) 200-200-20 MG/5ML suspension 30 mL  30 mL Oral Q4H PRN Waylan Boga, NP      . atorvastatin (LIPITOR) tablet 20 mg  20 mg Oral Q2000 Waylan Boga, NP   20 mg at 05/02/13 1950  . busPIRone (BUSPAR) tablet 15 mg  15 mg Oral QID Waylan Boga, NP   15 mg at 05/03/13 [id number]  . calcitRIOL (ROCALTROL) capsule 1.25 mcg  1.25 mcg Oral Daily Waylan Boga, NP   1.25 mcg at 05/03/13 [id number]  . calcium carbonate (TUMS - dosed in mg elemental calcium) chewable tablet 200 mg of elemental calcium  1 tablet Oral TID Waylan Boga, NP   200 mg of elemental calcium at 05/03/13 0645  . cilostazol (PLETAL) tablet 50 mg  50 mg Oral  BID Waylan Boga, NP   50 mg at 05/03/13 [id number]  . cyclobenzaprine (FLEXERIL) tablet 5 mg  5 mg Oral TID PRN Waylan Boga, NP      . hydrOXYzine (ATARAX/VISTARIL) tablet 25 mg  25 mg Oral TID Waylan Boga, NP   25 mg at 05/03/13 0833  . hydrOXYzine (ATARAX/VISTARIL) tablet 50 mg  50 mg Oral QHS,MR X 1 Waylan Boga, NP   50 mg at 05/02/13 2211  . insulin aspart (novoLOG) injection 1-10 Units  1-10 Units Subcutaneous TID WC Waylan Boga, NP   4 Units at 05/03/13 0647  . insulin aspart (novoLOG) injection 20 Units  20 Units Subcutaneous TID WC Waylan Boga, NP   20 Units at 05/03/13 0645  . insulin glargine (LANTUS) injection 22  Units  22 Units Subcutaneous Drue Dun, NP   22 Units at 05/03/13 [phone]  . loratadine (CLARITIN) tablet 10 mg  10 mg Oral Daily Waylan Boga, NP   10 mg at 05/03/13 [id number]  . magnesium hydroxide (MILK OF MAGNESIA) suspension 30 mL  30 mL Oral Daily PRN Waylan Boga, NP      . metoCLOPramide (REGLAN) tablet 5 mg  5 mg Oral TID PC & HS Waylan Boga, NP   5 mg at 05/03/13 0835  . mycophenolate (MYFORTIC) EC tablet 360 mg  360 mg Oral BID Waylan Boga, NP   360 mg at 05/03/13 0835  . oxyCODONE (Oxy IR/ROXICODONE) immediate release tablet 10 mg  10 mg Oral TID PRN Waylan Boga, NP      . pantoprazole (PROTONIX) EC tablet 40 mg  40 mg Oral BID Waylan Boga, NP   40 mg at 05/03/13 0840  . predniSONE (DELTASONE) tablet 10 mg  10 mg Oral Q breakfast Waylan Boga, NP   10 mg at 05/03/13 0836  . promethazine (PHENERGAN) tablet 50 mg  50 mg Oral Q6H PRN Waylan Boga, NP   50 mg at 05/02/13 1834  . sertraline (ZOLOFT) tablet 250 mg  250 mg Oral Daily Waylan Boga, NP   250 mg at 05/03/13 0835  . tacrolimus (PROGRAF) capsule 3.5 mg  3.5 mg Oral BID Waylan Boga, NP   3.5 mg at 05/03/13 [id number]    Lab Results:  Results for orders placed during the hospital encounter of 05/02/13 (from the past 48 hour(s))  GLUCOSE, CAPILLARY     Status: Abnormal   Collection Time    05/02/13 12:15 PM      Result Value Range   Glucose-Capillary 417 (*) 70 - 99 mg/dL    Physical Findings: AIMS: Facial and Oral Movements Muscles of Facial Expression: None, normal Lips and Perioral Area: None, normal Jaw: None, normal Tongue: None, normal,Extremity Movements Upper (arms, wrists, hands, fingers): None, normal Lower (legs, knees, ankles, toes): None, normal, Trunk Movements Neck, shoulders, hips: None, normal, Overall Severity Severity of abnormal movements (highest score from questions above): None, normal Incapacitation due to abnormal movements: None, normal Patient's awareness of abnormal movements (rate only  patient's report): No Awareness, Dental Status Current problems with teeth and/or dentures?: No Does patient usually wear dentures?: No  CIWA:    COWS:     Treatment Plan Summary: Daily contact with patient to assess and evaluate symptoms and progress in treatment Medication management  Plan:  Review of chart, vital signs, medications, and notes. 1-Individual and group therapy 2-Medication management for depression and anxiety:  Medications reviewed with the patient and she stated no untoward effects, no changes made 3-Coping skills for depression,  anxiety, and pain 4-Continue crisis stabilization and management 5-Address health issues--monitoring blood sugars, improved 6-Treatment plan in progress to prevent relapse of depression and anxiety  Medical Decision Making Problem Points:  Established problem, stable/improving (1) and Review of psycho-social stressors (1) Data Points:  Review of medication regiment & side effects (2)  I certify that inpatient services furnished can reasonably be expected to improve the patient's condition.   Waylan Boga, [place] 05/03/2013, 11:08 AM

## 2013-05-03 NOTE — Progress Notes (Signed)
Writer observed patient sitting in the dayroom watching tv. Writer spoke with patient 1:1 and she reports having had a good day. Writer observed cast on her right foot and inquired if she was in pain and she rated her pain at a 5 and asked if she would like pain medication and she refused reporting that her pain is tolerable and she doesn't want to take pain med. Writer informed patient of hs meds ordered and she is agreeable to taking them at scheduled time.Patient voiced no complaints.  Patient currently denies si/hi/a/v hallucinations. Support and encouragement offered, safety maintained on unit, will continue to monitor.

## 2013-05-03 NOTE — Progress Notes (Signed)
Patient ID: [name], female   DOB: 1976-09-12, 37 y.o.   MRN: [id number] Psychoeducational Group Note  Date:  05/03/2013 Time:  1000am  Group Topic/Focus:  Making Healthy Choices:   The focus of this group is to help patients identify negative/unhealthy choices they were using prior to admission and identify positive/healthier coping strategies to replace them upon discharge.  Participation Level:  Did Not Attend  Participation Quality:    Affect:   Cognitive:    Insight:  Engagement in Group:   Additional Comments:  Inventory and healthy support systems.   Pricilla Larsson 05/03/2013,10:57 AM

## 2013-05-04 DIAGNOSIS — F429 Obsessive-compulsive disorder, unspecified: Principal | ICD-10-CM

## 2013-05-04 LAB — GLUCOSE, CAPILLARY
Glucose-Capillary: 224 mg/dL — ABNORMAL HIGH (ref 70–99)
Glucose-Capillary: 75 mg/dL (ref 70–99)
Glucose-Capillary: 141 mg/dL — ABNORMAL HIGH (ref 70–99)
Glucose-Capillary: 235 mg/dL — ABNORMAL HIGH (ref 70–99)
Glucose-Capillary: 98 mg/dL (ref 70–99)
Glucose-Capillary: 306 mg/dL — ABNORMAL HIGH (ref 70–99)
Glucose-Capillary: 363 mg/dL — ABNORMAL HIGH (ref 70–99)
Glucose-Capillary: 99 mg/dL (ref 70–99)
Glucose-Capillary: 156 mg/dL — ABNORMAL HIGH (ref 70–99)
Glucose-Capillary: 107 mg/dL — ABNORMAL HIGH (ref 70–99)
Glucose-Capillary: 210 mg/dL — ABNORMAL HIGH (ref 70–99)
Glucose-Capillary: 165 mg/dL — ABNORMAL HIGH (ref 70–99)

## 2013-05-04 MED ORDER — FLUOXETINE HCL 10 MG PO CAPS
10.0000 mg | ORAL_CAPSULE | Freq: Every day | ORAL | Status: DC
  Administered 2013-05-04 – 2013-05-05 (×2): 10 mg via ORAL
  Filled 2013-05-04 (×4): qty 1

## 2013-05-04 MED ORDER — SERTRALINE HCL 100 MG PO TABS
200.0000 mg | ORAL_TABLET | Freq: Every day | ORAL | Status: DC
  Administered 2013-05-05: 200 mg via ORAL
  Filled 2013-05-04 (×2): qty 2

## 2013-05-04 NOTE — Progress Notes (Signed)
Adult Psychoeducational Group Note  Date:  05/04/2013 Time:  6:51 PM  Group Topic/Focus:  Self Care:   The focus of this group is to help patients understand the importance of self-care in order to improve or restore emotional, physical, spiritual, interpersonal, and financial health.  Participation Level:  Did Not Attend  Additional Comments:  Pt did not attend group despite being encouraged by staff to do so.  Arbie Cookey K 05/04/2013, 6:51 PM

## 2013-05-04 NOTE — Progress Notes (Signed)
Recreation Therapy Notes  Date: 05.12.2014  Time: 3:00pm  Location: [address] Day Room   Group Topic/Focus: Communication   Participation Level:  None  Participation Quality:  Observation  Affect:  Flat  Cognitive:  Appropriate   Additional Comments: Activity: The Question Web; Explanation: Patients were asked to pass a spool of string around the circle. As each patient passed the spool they stated a number 1 - 20. The person they passed the spool to were then asked to answer a question read by LRT.   Patient attended group, but did not participate in group activity. Patient left group session at approximately 3:15pm and did not return to group session.   Laureen Ochs Enos Muhl, LRT/CTRS  Lane Hacker 05/04/2013 4:35 PM

## 2013-05-04 NOTE — BHH Group Notes (Signed)
[facility] LCSW Aftercare Discharge Planning Group Note   05/04/2013 3:55 PM  Participation Quality:  Appropriate  Mood/Affect:  Appropriate and Depressed  Depression Rating:  4  Anxiety Rating:  8  Thoughts of Suicide:  No  Will you contract for safety?   N/A  Current AVH:  No  Plan for Discharge/Comments:  Patient reports admitted to hospital due to being overwhelmed with life but did not want to go into concerns in group.  Patient advised Probation officer would meet with her private.  Patient asked that employer be contacted.  Transportation Means: Patient has transportation.   Supports:  Patient has a good support system.   Jsaon Yoo, Eulas Post

## 2013-05-04 NOTE — Progress Notes (Signed)
Patient ID: [name], female   DOB: 05/05/1976, 37 y.o.   MRN: [id number] D- Patient reports she requested medication to sleep and she is eating about one third of her meals.  Her energy level is low and her ability to pay attention is improving.  She rates her depression at 4 and hopelessness at 0.  She denies thoughts of self harm.  A talked with patient about her diabetes and her self care.  Her CBG was at 107at noon.  R- Patient says that it has been high while here despite her attempts to eat well.  Patient says she needs to "learn some ways to talk to my family about feelings and needs."  Attending groups.  Using the wheelchair most of the time but did walk in the hall a little. Walking boot  in place on R foot.   Patient is a fall risk and she is using the handrail while walking.  Interacting with peers and staff.  Patient knows her medications and what they are for.

## 2013-05-04 NOTE — Progress Notes (Signed)
Adult Psychoeducational Group Note  Date:  05/04/2013 Time:  9:50 PM  Group Topic/Focus:  Wrap-Up Group:   The focus of this group is to help patients review their daily goal of treatment and discuss progress on daily workbooks.  Participation Level:  None  Participation Quality:  Resistant  Affect:  Resistant  Cognitive:  Appropriate  Insight: Limited  Engagement in Group:  None  Modes of Intervention:  Exploration and Support  Additional Comments:  Pt was able to attend group but did not want to speak.  Trevar Boehringer, Patrick North 05/04/2013, 9:50 PM

## 2013-05-04 NOTE — Progress Notes (Signed)
Writer has observed patient sitting in the hallway talking with other peers. Patient reports having had a good day. Patient inquired about a staff member (Education officer, museum) calling her place of employment to notify them that she would not be at work by 7 am so that she would not be penalized. Writer spoke with [facility], Press photographer concerning this matter and it was explained to her that she would need to notify her place of employment and a Education officer, museum would fax a letter to her job on Monday. Patient was not pleased with this information but was allowed to use the computer to email her employer to notify them of her absence on tomorrow since she did not have the phone number/ code number she reported that she would need in order to leave a message. Patient was appreciative. Supported and encouraged to follow up with her Education officer, museum on tomorrow. Patient compliant with medications, currently denies si/hi/a/v hallucinations. Safety maintained on unit, will continue to monitor.

## 2013-05-04 NOTE — Tx Team (Signed)
Interdisciplinary Treatment Plan Update   Date Reviewed:  05/04/2013  Time Reviewed:  9:55 AM  Progress in Treatment:   Attending groups: Yes Participating in groups: Yes Taking medication as prescribed: Yes  Tolerating medication: Yes Family/Significant other contact made: No, but will ask patient for consent for collateral contact Patient understands diagnosis: Yes  Discussing patient identified problems/goals with staff: Yes Medical problems stabilized or resolved: Yes Denies suicidal/homicidal ideation: Yes Patient has not harmed self or others: Yes  For review of initial/current patient goals, please see plan of care.  Estimated Length of Stay:  2-3 ays  Reasons for Continued Hospitalization:  Anxiety Depression Medication stabilization  Discharge Plan or Barriers:   Home with outpatient follow up  Additional Comments: Patient admitted to hospital with generalized anxiety.  MD to evaluate for medication needs.  Attendees:  Patient:  05/04/2013 9:55 AM   Signature: Elvin So, MD 05/04/2013 9:55 AM  Signature: 05/04/2013 9:55 AM  Signature:  05/04/2013 9:55 AM  Signature:Beverly Danelle Earthly, RN 05/04/2013 9:55 AM  Signature:  Thurnell Garbe RN 05/04/2013 9:55 AM  Signature:  Joette Catching, LCSW 05/04/2013 9:55 AM  Signature:Maseta Dorley CareCoordinator 05/04/2013 9:55 AM  Signature: Hilda Lias, BSW 05/04/2013 9:55 AM  Signature: Elmarie Shiley, [facility] 05/04/2013 9:55 AM  Signature:    Signature:    Signature:      Scribe for Treatment Team:   Joette Catching,  05/04/2013 9:55 AM
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
## 2013-05-05 DIAGNOSIS — F4001 Agoraphobia with panic disorder: Secondary | ICD-10-CM

## 2013-05-05 DIAGNOSIS — F3289 Other specified depressive episodes: Secondary | ICD-10-CM

## 2013-05-05 DIAGNOSIS — F329 Major depressive disorder, single episode, unspecified: Secondary | ICD-10-CM

## 2013-05-05 LAB — GLUCOSE, CAPILLARY: Glucose-Capillary: 116 mg/dL — ABNORMAL HIGH (ref 70–99)

## 2013-05-05 MED ORDER — FLUOXETINE HCL 10 MG PO CAPS: 10.0000 mg | ORAL_CAPSULE | Freq: Every day | ORAL | Status: DC

## 2013-05-05 MED ORDER — CILOSTAZOL 50 MG PO TABS: 50.0000 mg | ORAL_TABLET | Freq: Two times a day (BID) | ORAL | Status: DC

## 2013-05-05 MED ORDER — TACROLIMUS 0.5 MG PO CAPS: 0.5000 mg | ORAL_CAPSULE | Freq: Two times a day (BID) | ORAL | Status: DC

## 2013-05-05 MED ORDER — HYDROXYZINE HCL 50 MG PO TABS: 50.0000 mg | ORAL_TABLET | Freq: Every evening | ORAL | Status: DC | PRN

## 2013-05-05 MED ORDER — ATORVASTATIN CALCIUM 40 MG PO TABS
20.0000 mg | ORAL_TABLET | Freq: Every day | ORAL | Status: DC
Start: 2013-05-05 — End: 2013-08-13

## 2013-05-05 MED ORDER — OXYCODONE HCL 10 MG PO TABS: 10.0000 mg | ORAL_TABLET | Freq: Three times a day (TID) | ORAL | Status: DC

## 2013-05-05 MED ORDER — SERTRALINE HCL 100 MG PO TABS: 200.0000 mg | ORAL_TABLET | Freq: Every day | ORAL | Status: DC

## 2013-05-05 MED ORDER — CALCITRIOL 0.25 MCG PO CAPS: 1.2500 ug | ORAL_CAPSULE | Freq: Every day | ORAL | Status: DC

## 2013-05-05 MED ORDER — CYCLOBENZAPRINE HCL 5 MG PO TABS: 5.0000 mg | ORAL_TABLET | Freq: Three times a day (TID) | ORAL | Status: DC | PRN

## 2013-05-05 MED ORDER — BUSPIRONE HCL 15 MG PO TABS: 15.0000 mg | ORAL_TABLET | Freq: Four times a day (QID) | ORAL | Status: DC

## 2013-05-05 MED ORDER — LORATADINE 10 MG PO TABS: 10.0000 mg | ORAL_TABLET | Freq: Every day | ORAL | Status: DC

## 2013-05-05 MED ORDER — CALCIUM CARBONATE 1250 (500 CA) MG PO TABS: 1.0000 | ORAL_TABLET | Freq: Three times a day (TID) | ORAL | Status: DC

## 2013-05-05 MED ORDER — METOCLOPRAMIDE HCL 5 MG PO TABS: 5.0000 mg | ORAL_TABLET | Freq: Four times a day (QID) | ORAL | Status: DC

## 2013-05-05 MED ORDER — CETIRIZINE HCL 10 MG PO TABS: 10.0000 mg | ORAL_TABLET | Freq: Every evening | ORAL | Status: DC

## 2013-05-05 MED ORDER — TIOTROPIUM BROMIDE MONOHYDRATE 18 MCG IN CAPS: 18.0000 ug | ORAL_CAPSULE | Freq: Every day | RESPIRATORY_TRACT | Status: DC

## 2013-05-05 MED ORDER — INSULIN GLARGINE 100 UNIT/ML ~~LOC~~ SOLN: 18.0000 [IU] | Freq: Every morning | SUBCUTANEOUS | Status: DC

## 2013-05-05 MED ORDER — PANTOPRAZOLE SODIUM 40 MG PO TBEC: 40.0000 mg | DELAYED_RELEASE_TABLET | Freq: Two times a day (BID) | ORAL | Status: DC

## 2013-05-05 MED ORDER — PROMETHAZINE HCL 25 MG PO TABS: 25.0000 mg | ORAL_TABLET | Freq: Four times a day (QID) | ORAL | Status: DC | PRN

## 2013-05-05 MED ORDER — MYCOPHENOLATE SODIUM 180 MG PO TBEC: 360.0000 mg | DELAYED_RELEASE_TABLET | Freq: Two times a day (BID) | ORAL | Status: DC

## 2013-05-05 NOTE — Progress Notes (Signed)
Discharge Note:  Patient discharged to her home.  Patient received all her belongings, clothing, toiletries, miscellaneous items, prescriptions.  Suicide prevention information given and discussed with patient who stated she understood and had no questions.  Patient stated she appreciated all assistance received from [facility].

## 2013-05-05 NOTE — Progress Notes (Addendum)
Adult Psychoeducational Group Note  Date:  05/05/2013 Time:  10:00 am Group Topic/Focus: recovery Recovery Goals:   The focus of this group is to identify appropriate goals for recovery and establish a plan to achieve them.  Participation Level:  None  Participation Quality:  Drowsy and Inattentive  Affect:  Flat  Cognitive:  None  Insight: None  Engagement in Group:  None  Additional Comments:  Patient complained of not feeling well before group but still attended. She however would dose off.   Lavinia Sharps P 05/05/2013, 1:41 PM

## 2013-05-05 NOTE — Progress Notes (Signed)
I agree with this note. I certify that inpatient services furnished can reasonably be expected to improve the patient's condition. Karuna Balducci, MD, MSPH  

## 2013-05-05 NOTE — BHH Group Notes (Signed)
[facility] LCSW Group Therapy  05/05/2013  1:15 PM   Type of Therapy:  Group Therapy  Participation Level:  Active  Participation Quality:  Appropriate and Attentive  Affect:  Appropriate, Calm  Cognitive:  Alert and Appropriate  Insight:  Developing/Improving and Engaged  Engagement in Therapy:  Developing/Improving and Engaged  Modes of Intervention:  Clarification, Confrontation, Discussion, Education, Exploration, Limit-setting, Orientation, Problem-solving, Rapport Building, Art therapist, Socialization and Support  Summary of Progress/Problems: The topic for group therapy was feelings about diagnosis.  Pt actively participated in group discussion on their past and current diagnosis and how they feel towards this.  Pt also identified how society and family members judge them, based on their diagnosis as well as stereotypes and stigmas.  Pt shared that she is diagnosed with OCD and was able to share how this plays out in her life.  Pt shared that she feels she needs to have control of everything and impulsively buys things, such as 400 rolls of toilet paper and a car last week.  Pt was insightful of her diagnosis and symptoms and open to share.  Pt was then pulled from group to d/c.     Regan Lemming, [place] 05/05/2013 2:41 PM

## 2013-05-05 NOTE — BHH Suicide Risk Assessment (Signed)
[facility] INPATIENT:  Family/Significant Other Suicide Prevention Education  Suicide Prevention Education: No suicide prevention education done due to patient was not endorsing SI.  Concha Pyo 05/05/2013, 2:50 PM
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
## 2013-05-05 NOTE — BHH Suicide Risk Assessment (Signed)
Suicide Risk Assessment  Discharge Assessment     Demographic Factors:  Female, African [place], employed  Mental Status Per Nursing Assessment::   On Admission:  NA  Current Mental Status by Physician: Patient alert and oriented to 4. Denies AH/VH/SI/HI.  Loss Factors: Decrease in vocational status and Decline in physical health  Historical Factors: Impulsivity  Risk Reduction Factors:   Positive social support and Positive coping skills or problem solving skills  Continued Clinical Symptoms:  Obsessive-Compulsive Disorder  Cognitive Features That Contribute To Risk:  Cognitively intact    Suicide Risk:  Minimal: No identifiable suicidal ideation.  Patients presenting with no risk factors but with morbid ruminations; may be classified as minimal risk based on the severity of the depressive symptoms  Discharge Diagnoses:   AXIS I:  Obsessive Compulsive Disorder AXIS II:  Deferred AXIS III:   Past Medical History  Diagnosis Date  . Chronic anemia   . Panic disorder   . Crohn's disease   . Complication of anesthesia     does not do well with sedation  . Anxiety     OCD  . Renal disorder   . 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 Fracture 05/01/2013    Right foot, in cast   AXIS IV:  other psychosocial or environmental problems AXIS V:  61-70 mild symptoms  Plan Of Care/Follow-up recommendations:  Activity:  as tolerated Diet:  Low salt, low fat diet Follow up with outpatient appointments.  Is patient on multiple antipsychotic therapies at discharge:  No   Has Patient had three or more failed trials of antipsychotic monotherapy by history:  No  Recommended Plan for Multiple Antipsychotic Therapies: NA  Jaquarious Grey 05/05/2013, 12:16 PM

## 2013-05-05 NOTE — Progress Notes (Signed)
[facility] Adult Case Management Discharge Plan :  Will you be returning to the same living situation after discharge: Yes,  patient is returning to her home. At discharge, do you have transportation home?:Yes,  Patient to arrange transportation Do you have the ability to pay for your medications:Yes,  Patient has insurance and able to make co-pay.  Release of information consent forms completed and in the chart;  Patient's signature needed at discharge.  Patient to Follow up at: Follow-up Information   Follow up with [facility] On 05/12/2013. (You are scheduled with Baxter Flattery Tuesday, May 12, 2013 at 4 PM)    Contact information:   [address], [place]   [address]  [phone]      Follow up with Dr. Gilford Rile - [facility] On 06/10/2013. (Wednsday, June 10, 2013 at 2:15 PM)    Contact information:   [address] [place], [place]   [address]  [phone]      Patient denies SI/HI:  Patient has not endorsed SI during admission or on admission.  Safety Planning and Suicide Prevention discussed: .Reviewed with all patients during discharge planning group   [name], Eulas Post 05/05/2013, 2:47 PM

## 2013-05-05 NOTE — [facility] Group Notes (Signed)
[facility] LCSW Aftercare Discharge Planning Group Note   05/05/2013 10:08 AM  Participation Quality:  Appropriate  Mood/Affect:  Appropriate  Depression Rating:  1  Anxiety Rating:  3  Thoughts of Suicide:  No  Will you contract for safety?   NA  Current AVH:  No  Plan for Discharge/Comments:   Patient reports being much better today and hopes to discharge home.  Patient advised she will not be able to see Dr. Gilford Rile until mid-June and will need to continue see Dr. Silvio Pate at [facility] for medications.   Transportation Means:   Patient has transportation.   Supports:  Patient has a good support system.   Jernard Reiber, Eulas Post

## 2013-05-05 NOTE — Progress Notes (Signed)
Patient ID: [name], female   DOB: [birth year], 37 y.o.   MRN: [id number] D: Pt. Visible on the unit in dayroom interacting and watching TV.  Pt. Reports anxiety at "3" of 10. "I'm ready to go home". Pt. In WC, cast to right foot, pt. Able to move toes freely. Pt. Up ad lib to bathroom.  A: Writer introduce self to client, reviewed medications. Staff encouraged group. Staff will monitor q37min for safety. R: Pt. Is safe on the unit. Pt. Attended group.

## 2013-05-05 NOTE — Progress Notes (Signed)
[facility] Group Notes:  (Nursing/MHT/Case Management/Adjunct)  Date:  05/05/2013  Time:  12:11 PM  Type of Therapy:  Therapeutic Activity  Participation Level:  None  Participation Quality:  Inattentive and Resistant  Affect:  Appropriate and Flat  Cognitive:  Appropriate  Insight:  Appropriate  Engagement in Group:  None  Modes of Intervention:  Activity and Education  Summary of Progress/Problems: Pts were asked to play "Would you rather?" for a Therapeutic Activity.  Pt did not participate in group, stated "I am just watching" and then left group half way through.   Joneen Caraway 05/05/2013, 12:11 PM

## 2013-05-06 ENCOUNTER — Telehealth: Payer: Self-pay | Admitting: *Deleted

## 2013-05-06 NOTE — Telephone Encounter (Signed)
NTBS was recently admitted to hospital

## 2013-05-06 NOTE — Telephone Encounter (Signed)
Pt is aware that she needs to be seen before her medication and appt is frday 05/08/13

## 2013-05-06 NOTE — Progress Notes (Signed)
Patient Discharge Instructions:  After Visit Summary (AVS):   Faxed to:  05/06/13 Discharge Summary Note:   Faxed to:  05/06/13 Psychiatric Admission Assessment Note:   Faxed to:  05/06/13 Suicide Risk Assessment - Discharge Assessment:   Faxed to:  05/06/13 Faxed/Sent to the Next Level Care provider:  05/06/13 Next Level Care Provider Has Access to the EMR, 05/06/13 Faxed to [place] @ [phone] Records provided to [facility] via [place], 05/06/2013, 5:27 PM

## 2013-05-07 ENCOUNTER — Encounter (HOSPITAL_BASED_OUTPATIENT_CLINIC_OR_DEPARTMENT_OTHER): Payer: Medicare HMO

## 2013-05-08 ENCOUNTER — Ambulatory Visit (INDEPENDENT_AMBULATORY_CARE_PROVIDER_SITE_OTHER): Payer: Medicare HMO | Admitting: Family Medicine

## 2013-05-08 ENCOUNTER — Encounter: Payer: Self-pay | Admitting: Family Medicine

## 2013-05-08 VITALS — BP 110/70 | HR 76 | Temp 98.2°F | Resp 20 | Wt 254.0 lb

## 2013-05-08 DIAGNOSIS — F1120 Opioid dependence, uncomplicated: Secondary | ICD-10-CM

## 2013-05-08 DIAGNOSIS — M545 Low back pain, unspecified: Secondary | ICD-10-CM

## 2013-05-08 DIAGNOSIS — F429 Obsessive-compulsive disorder, unspecified: Secondary | ICD-10-CM

## 2013-05-08 DIAGNOSIS — F411 Generalized anxiety disorder: Secondary | ICD-10-CM

## 2013-05-08 MED ORDER — OXYCODONE HCL 10 MG PO TABS: 10.0000 mg | ORAL_TABLET | Freq: Three times a day (TID) | ORAL | Status: DC

## 2013-05-08 MED ORDER — INSULIN GLARGINE 100 UNIT/ML ~~LOC~~ SOLN: 22.0000 [IU] | Freq: Every morning | SUBCUTANEOUS | Status: DC

## 2013-05-08 NOTE — Progress Notes (Signed)
Subjective:    Patient ID: [name], female    DOB: August 10, 1976, 37 y.o.   MRN: [id number]  HPI I asked the patient to come in when she called for refill on her oxycodone.  In the last week she was admitted to behavioral health due to the generalized anxiety disorder and her obsessive-compulsive disorder. She is under him his mother stress with school work, [employer].  She recently went to a store and purchased 419 rolls of toilet paper. However she only wanted 415 so she left 4 rolls of toilet paper at the store.  Her therapist was concerned she was having a nervous breakdown and recommended she go to behavioral health.  They have discontinued her Zoloft and switched her to Prozac.  She states that she is doing better. She felt like she did regain control. She denies any suicidal ideation. She denies any manic symptoms. She is sleeping better.  She is due for refill on her oxycodone. She has chronic pain in her legs and back and abdomen. This stems from her kidney transplant as well as osteoarthritis of the right knee and chronic lumbago.  She denies any abuse or diversion of the medication.  I asked her to come in today just so I could see the patient face-to-face and make sure that she was safe to receive his quantity of narcotics.   Past Medical History  Diagnosis Date  . Chronic anemia   . Panic disorder   . Crohn's disease   . Complication of anesthesia     does not do well with sedation  . Anxiety     OCD  . Renal disorder   . 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 Fracture 05/01/2013    Right foot, in cast   Current Outpatient Prescriptions on File Prior to Visit  Medication Sig Dispense Refill  . atorvastatin (LIPITOR) 40 MG tablet Take 0.5 tablets (20 mg total) by mouth at bedtime.      . busPIRone (BUSPAR) 15 MG tablet Take 1 tablet (15 mg total) by mouth 4 (four) times daily.  120 tablet  0  . calcitRIOL (ROCALTROL) 0.25 MCG capsule Take 5 capsules (1.25 mcg total) by mouth  daily.      . calcium carbonate (OS-CAL - DOSED IN MG OF ELEMENTAL CALCIUM) 1250 MG tablet Take 1 tablet (500 mg of elemental calcium total) by mouth 3 (three) times daily.      . cetirizine (ZYRTEC) 10 MG tablet Take 1 tablet (10 mg total) by mouth every evening.      . cilostazol (PLETAL) 50 MG tablet Take 1 tablet (50 mg total) by mouth 2 (two) times daily.      Marland Kitchen FLUoxetine (PROZAC) 10 MG capsule Take 1 capsule (10 mg total) by mouth daily.  30 capsule  0  . hydrOXYzine (ATARAX/VISTARIL) 50 MG tablet Take 1 tablet (50 mg total) by mouth at bedtime and may repeat dose one time if needed. For sleep  30 tablet  0  . insulin aspart (NOVOLOG) 100 UNIT/ML injection Inject 20-30 Units into the skin 3 (three) times daily before meals. Inject 20 units SQ with breakfast and lunch, then 30 units daily SQ with dinner      . Insulin Pen Needle (NOVOFINE AUTOCOVER) 30G X 8 MM MISC Inject 10 each into the skin as needed.  100 each  11  . Linaclotide (LINZESS) 145 MCG CAPS Take by mouth daily.      Marland Kitchen loratadine (CLARITIN)  10 MG tablet Take 1 tablet (10 mg total) by mouth daily.      . metoCLOPramide (REGLAN) 5 MG tablet Take 1 tablet (5 mg total) by mouth 4 (four) times daily. Frequency unspecified      . mycophenolate (MYFORTIC) 180 MG EC tablet Take 2 tablets (360 mg total) by mouth 2 (two) times daily.      Marland Kitchen omeprazole (PRILOSEC) 40 MG capsule Take 40 mg by mouth 3 (three) times daily.      . pantoprazole (PROTONIX) 40 MG tablet Take 1 tablet (40 mg total) by mouth 2 (two) times daily.      . predniSONE (DELTASONE) 10 MG tablet Take 10 mg by mouth daily.      . promethazine (PHENERGAN) 25 MG tablet Take 1-2 tablets (25-50 mg total) by mouth every 6 (six) hours as needed. For nausea  30 tablet    . sertraline (ZOLOFT) 100 MG tablet Take 2 tablets (200 mg total) by mouth daily. For Depression  60 tablet  0  . tacrolimus (PROGRAF) 0.5 MG capsule Take 1 capsule (0.5 mg total) by mouth 2 (two) times daily. Take  with 1mg  capsules for total of 3.5mg  total dose twice daily      . tiotropium (SPIRIVA) 18 MCG inhalation capsule Place 1 capsule (18 mcg total) into inhaler and inhale daily.  30 capsule     No current facility-administered medications on file prior to visit.   Allergies  Allergen Reactions  . Barium-Containing Compounds Shortness Of Breath  . Dilaudid (Hydromorphone Hcl) Anaphylaxis  . Hydromorphone Shortness Of Breath  . Ivp Dye (Iodinated Diagnostic Agents) Anaphylaxis  . Shellfish Allergy Shortness Of Breath and Itching    Causes red, patchy areas to form  . Vancomycin Anaphylaxis  . Adhesive (Tape) Other (See Comments)    Dermatitis   . Infed (Iron Dextran) Itching  . Sulfa Antibiotics Hives  . Iopamidol Itching and Rash   History   Social History  . Marital Status: Single    Spouse Name: N/A    Number of Children: N/A  . Years of Education: N/A   Occupational History  . Not on file.   Social History Main Topics  . Smoking status: Never Smoker   . Smokeless tobacco: Never Used  . Alcohol Use: No  . Drug Use: No  . Sexually Active: Not on file   Other Topics Concern  . Not on file   Social History Narrative  . No narrative on file    Review of Systems  All other systems reviewed and are negative.       Objective:   Physical Exam  Vitals reviewed. Neck: Normal range of motion. No thyromegaly present.  Cardiovascular: Normal rate, regular rhythm and normal heart sounds.   No murmur heard. Pulmonary/Chest: Effort normal and breath sounds normal. No respiratory distress. She has no wheezes. She has no rales.  Lymphadenopathy:    She has no cervical adenopathy.  Psychiatric: She has a normal mood and affect. Her behavior is normal. Judgment and thought content normal.          Assessment & Plan:  GAD (generalized anxiety disorder)  OCD (obsessive compulsive disorder)  Opioid dependence  Lumbago   The patient's judgment, thought processes,  and mentation seem appropriate. She was due for her oxycodone and 10 mg pills 90 per month according to her pain contract.  After meeting and discussing with the patient, I feel that she is not a threat to herself  or to anyone else and that she would not abuse medications, therefore I refilled the Oxycodone.

## 2013-05-09 ENCOUNTER — Telehealth: Payer: Self-pay | Admitting: *Deleted

## 2013-05-15 MED ORDER — INSULIN GLARGINE 100 UNIT/ML ~~LOC~~ SOLN: 24.0000 [IU] | Freq: Every morning | SUBCUTANEOUS | Status: DC

## 2013-05-15 NOTE — Telephone Encounter (Signed)
Med rf °

## 2013-06-02 ENCOUNTER — Telehealth: Payer: Self-pay | Admitting: Family Medicine

## 2013-06-02 ENCOUNTER — Other Ambulatory Visit: Payer: Self-pay | Admitting: Family Medicine

## 2013-06-02 MED ORDER — OXYCODONE HCL 10 MG PO TABS: 10.0000 mg | ORAL_TABLET | Freq: Three times a day (TID) | ORAL | Status: DC

## 2013-06-02 NOTE — Telephone Encounter (Signed)
?   OK to Refill  

## 2013-06-02 NOTE — Telephone Encounter (Signed)
rx is on my desk

## 2013-06-03 NOTE — Telephone Encounter (Signed)
Pt aware via voicemail 

## 2013-06-10 ENCOUNTER — Ambulatory Visit (HOSPITAL_COMMUNITY): Payer: Self-pay | Admitting: Psychiatry

## 2013-06-22 ENCOUNTER — Telehealth: Payer: Self-pay | Admitting: Family Medicine

## 2013-06-25 NOTE — Telephone Encounter (Signed)
Denied..the patient does not want [place] refilling these. Her endocrinologist does this.

## 2013-07-06 ENCOUNTER — Telehealth: Payer: Self-pay | Admitting: Family Medicine

## 2013-07-06 MED ORDER — OXYCODONE HCL 10 MG PO TABS: 10.0000 mg | ORAL_TABLET | Freq: Three times a day (TID) | ORAL | Status: DC

## 2013-07-06 NOTE — Telephone Encounter (Signed)
Med refilled and pt aware to pickup

## 2013-07-06 NOTE — Telephone Encounter (Signed)
Ok to refill 

## 2013-07-21 ENCOUNTER — Telehealth: Payer: Self-pay | Admitting: Family Medicine

## 2013-07-21 DIAGNOSIS — E1165 Type 2 diabetes mellitus with hyperglycemia: Secondary | ICD-10-CM

## 2013-07-21 DIAGNOSIS — IMO0002 Reserved for concepts with insufficient information to code with codable children: Secondary | ICD-10-CM

## 2013-07-21 DIAGNOSIS — E1129 Type 2 diabetes mellitus with other diabetic kidney complication: Secondary | ICD-10-CM

## 2013-07-21 NOTE — Telephone Encounter (Signed)
Blood sugars are erratic.  Wants referral to endocrinologist.  Has just seen Dr Dennard Schaumann but second opinion on Diabetic Care.  Recommended by nephrologist to see endocrinologist.

## 2013-07-29 ENCOUNTER — Other Ambulatory Visit: Payer: Self-pay | Admitting: Gastroenterology

## 2013-07-29 DIAGNOSIS — K219 Gastro-esophageal reflux disease without esophagitis: Secondary | ICD-10-CM

## 2013-07-29 DIAGNOSIS — K3184 Gastroparesis: Secondary | ICD-10-CM

## 2013-08-03 ENCOUNTER — Encounter (HOSPITAL_COMMUNITY): Payer: Self-pay | Admitting: *Deleted

## 2013-08-04 ENCOUNTER — Telehealth: Payer: Self-pay | Admitting: Family Medicine

## 2013-08-05 MED ORDER — OXYCODONE HCL 10 MG PO TABS: 10.0000 mg | ORAL_TABLET | Freq: Three times a day (TID) | ORAL | Status: DC

## 2013-08-05 NOTE — Telephone Encounter (Signed)
RX printed, left up front and patient aware to pick up  

## 2013-08-07 ENCOUNTER — Encounter (HOSPITAL_COMMUNITY): Payer: Self-pay

## 2013-08-07 ENCOUNTER — Encounter (HOSPITAL_COMMUNITY)
Admission: RE | Admit: 2013-08-07 | Discharge: 2013-08-07 | Disposition: A | Discharge status: Home / Self Care | Payer: Medicare HMO | Source: Ambulatory Visit | Attending: Gastroenterology | Admitting: Gastroenterology

## 2013-08-07 DIAGNOSIS — K3184 Gastroparesis: Secondary | ICD-10-CM

## 2013-08-07 DIAGNOSIS — K3189 Other diseases of stomach and duodenum: Secondary | ICD-10-CM | POA: Insufficient documentation

## 2013-08-07 DIAGNOSIS — E119 Type 2 diabetes mellitus without complications: Secondary | ICD-10-CM | POA: Insufficient documentation

## 2013-08-07 DIAGNOSIS — R1013 Epigastric pain: Secondary | ICD-10-CM | POA: Insufficient documentation

## 2013-08-07 DIAGNOSIS — K219 Gastro-esophageal reflux disease without esophagitis: Secondary | ICD-10-CM

## 2013-08-07 DIAGNOSIS — K5090 Crohn's disease, unspecified, without complications: Secondary | ICD-10-CM | POA: Insufficient documentation

## 2013-08-07 DIAGNOSIS — K449 Diaphragmatic hernia without obstruction or gangrene: Secondary | ICD-10-CM | POA: Insufficient documentation

## 2013-08-07 DIAGNOSIS — R1110 Vomiting, unspecified: Secondary | ICD-10-CM | POA: Insufficient documentation

## 2013-08-07 MED ORDER — TECHNETIUM TC 99M SULFUR COLLOID
2.1000 | Freq: Once | INTRAVENOUS | Status: AC | PRN
  Administered 2013-08-07: 2.1 via ORAL

## 2013-08-10 ENCOUNTER — Ambulatory Visit (HOSPITAL_COMMUNITY)
Admission: RE | Admit: 2013-08-10 | Discharge: 2013-08-10 | Disposition: A | Discharge status: Home / Self Care | Payer: Medicare HMO | Source: Ambulatory Visit | Attending: Gastroenterology | Admitting: Gastroenterology

## 2013-08-10 ENCOUNTER — Encounter (HOSPITAL_COMMUNITY)
Admission: RE | Disposition: A | Discharge status: Home / Self Care | Payer: Self-pay | Source: Ambulatory Visit | Attending: Gastroenterology

## 2013-08-10 DIAGNOSIS — F429 Obsessive-compulsive disorder, unspecified: Secondary | ICD-10-CM | POA: Insufficient documentation

## 2013-08-10 DIAGNOSIS — N289 Disorder of kidney and ureter, unspecified: Secondary | ICD-10-CM | POA: Insufficient documentation

## 2013-08-10 DIAGNOSIS — E039 Hypothyroidism, unspecified: Secondary | ICD-10-CM | POA: Insufficient documentation

## 2013-08-10 DIAGNOSIS — Z9109 Other allergy status, other than to drugs and biological substances: Secondary | ICD-10-CM | POA: Insufficient documentation

## 2013-08-10 DIAGNOSIS — Z8541 Personal history of malignant neoplasm of cervix uteri: Secondary | ICD-10-CM | POA: Insufficient documentation

## 2013-08-10 DIAGNOSIS — K3184 Gastroparesis: Secondary | ICD-10-CM | POA: Insufficient documentation

## 2013-08-10 DIAGNOSIS — Z888 Allergy status to other drugs, medicaments and biological substances status: Secondary | ICD-10-CM | POA: Insufficient documentation

## 2013-08-10 DIAGNOSIS — Z794 Long term (current) use of insulin: Secondary | ICD-10-CM | POA: Insufficient documentation

## 2013-08-10 DIAGNOSIS — K219 Gastro-esophageal reflux disease without esophagitis: Secondary | ICD-10-CM | POA: Insufficient documentation

## 2013-08-10 DIAGNOSIS — Z91041 Radiographic dye allergy status: Secondary | ICD-10-CM | POA: Insufficient documentation

## 2013-08-10 DIAGNOSIS — E119 Type 2 diabetes mellitus without complications: Secondary | ICD-10-CM | POA: Insufficient documentation

## 2013-08-10 DIAGNOSIS — Z538 Procedure and treatment not carried out for other reasons: Secondary | ICD-10-CM | POA: Insufficient documentation

## 2013-08-10 DIAGNOSIS — F411 Generalized anxiety disorder: Secondary | ICD-10-CM | POA: Insufficient documentation

## 2013-08-10 DIAGNOSIS — Z882 Allergy status to sulfonamides status: Secondary | ICD-10-CM | POA: Insufficient documentation

## 2013-08-10 DIAGNOSIS — D649 Anemia, unspecified: Secondary | ICD-10-CM | POA: Insufficient documentation

## 2013-08-10 DIAGNOSIS — Z885 Allergy status to narcotic agent status: Secondary | ICD-10-CM | POA: Insufficient documentation

## 2013-08-10 DIAGNOSIS — Z85528 Personal history of other malignant neoplasm of kidney: Secondary | ICD-10-CM | POA: Insufficient documentation

## 2013-08-10 DIAGNOSIS — Z90710 Acquired absence of both cervix and uterus: Secondary | ICD-10-CM | POA: Insufficient documentation

## 2013-08-10 DIAGNOSIS — R1110 Vomiting, unspecified: Secondary | ICD-10-CM | POA: Insufficient documentation

## 2013-08-10 DIAGNOSIS — Z91013 Allergy to seafood: Secondary | ICD-10-CM | POA: Insufficient documentation

## 2013-08-10 DIAGNOSIS — Z853 Personal history of malignant neoplasm of breast: Secondary | ICD-10-CM | POA: Insufficient documentation

## 2013-08-10 DIAGNOSIS — F410 Panic disorder [episodic paroxysmal anxiety] without agoraphobia: Secondary | ICD-10-CM | POA: Insufficient documentation

## 2013-08-10 DIAGNOSIS — K5090 Crohn's disease, unspecified, without complications: Secondary | ICD-10-CM | POA: Insufficient documentation

## 2013-08-10 HISTORY — PX: ESOPHAGEAL MANOMETRY: SHX5429

## 2013-08-10 SURGERY — MANOMETRY, ESOPHAGUS
Anesthesia: Topical

## 2013-08-10 MED ORDER — LIDOCAINE VISCOUS 2 % MT SOLN
OROMUCOSAL | Status: AC
  Filled 2013-08-10: qty 15

## 2013-08-10 SURGICAL SUPPLY — 4 items
DRAPE UTILITY 15X26 W/TAPE STR (DRAPE) ×2 IMPLANT
GLOVE BIOGEL PI IND STRL 8 (GLOVE) ×1 IMPLANT
GLOVE BIOGEL PI INDICATOR 8 (GLOVE) ×1
GOWN PREVENTION PLUS LG XLONG (DISPOSABLE) ×2 IMPLANT

## 2013-08-10 NOTE — Progress Notes (Signed)
Patient unable to tolerate manometry study and refused to have it completed.

## 2013-08-10 NOTE — H&P (Signed)
Reason for Consult: GERD  [name] HPI: This is a 37 year old female who is well-known to me for her history of gastroparesis and GERD.  Recently she started to complain of nasal regurgitation of her PO intake.  This has been an intermittent issue in the past, but it has not flared up for some time.  Further evaluation is being pursued with a gastric emptying scan and manometry.  Past Medical History  Diagnosis Date  . Chronic anemia   . Panic disorder   . Crohn's disease   . Anxiety     OCD  . 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 Fracture 05/01/2013    Right foot, in cast  . Hypothyroidism     parathroid runs low  . Renal disorder   . History of blood transfusion 2 years ago  . Cancer 2001    renal cell , breast cancer both breast 2011, cervical cancer 2013  . Complication of anesthesia     hard to sedate dr Wyndell Cardiff aware    Past Surgical History  Procedure Laterality Date  . Nephrectomy transplanted organ  1999 and 2011  . Parathyroidectomy  02-20-2001  . Knee surgery Left april 2003    arthroscopy  . Cystoscopy  02/13/2012    Procedure: CYSTOSCOPY;  Surgeon: Franchot Gallo, MD;  Location: [facility];  Service: Urology;  Laterality: N/A;  insertion of ureteral catheter , removed per Dr Diona Fanti   . Tubal ligation  02-19-2000  . Abdominal hysterectomy  02/13/2012    Procedure: HYSTERECTOMY ABDOMINAL;  Surgeon: Cyril Mourning, MD;  Location: [facility];  Service: Gynecology;  Laterality: N/A;  . Breast lumpectomy and reconstruction Bilateral 2011    No family history on file.  Social History:  reports that she has never smoked. She has never used smokeless tobacco. She reports that she does not drink alcohol or use illicit drugs.  Allergies:  Allergies  Allergen Reactions  . Barium-Containing Compounds Shortness Of Breath  . Dilaudid [Hydromorphone Hcl] Anaphylaxis  . Hydromorphone Shortness Of Breath  . Ivp Dye [Iodinated Diagnostic Agents] Anaphylaxis  . Shellfish  Allergy Shortness Of Breath and Itching    Causes red, patchy areas to form  . Vancomycin Anaphylaxis  . Adhesive [Tape] Other (See Comments)    Dermatitis   . Infed [Iron Dextran] Itching  . Sulfa Antibiotics Hives  . Iopamidol Itching and Rash    Medications: Scheduled: Continuous:  No results found for this or any previous visit (from the past 24 hour(s)).   No results found.  ROS:  As stated above in the HPI otherwise negative.  Last menstrual period 01/30/2012.    PE: Gen: NAD, Alert and Oriented HEENT:  [place]/AT, EOMI Neck: Supple, no LAD Lungs: CTA Bilaterally CV: RRR without M/G/R ABM: Soft, NTND, +BS Ext: No C/C/E  Assessment/Plan: 1) GERD. 2) Regurgitation.  Plan: 1) Manometry.  [name] D 08/10/2013, 1:28 PM

## 2013-08-10 NOTE — Op Note (Signed)
The patient was not able to tolerate the procedure and she declined to retry.

## 2013-08-12 ENCOUNTER — Encounter (HOSPITAL_COMMUNITY): Payer: Self-pay | Admitting: Gastroenterology

## 2013-08-13 ENCOUNTER — Encounter (HOSPITAL_COMMUNITY): Payer: Self-pay | Admitting: Pharmacy Technician

## 2013-08-22 ENCOUNTER — Encounter (HOSPITAL_COMMUNITY): Payer: Self-pay | Admitting: *Deleted

## 2013-08-22 ENCOUNTER — Emergency Department (HOSPITAL_COMMUNITY): Payer: Medicare HMO

## 2013-08-22 ENCOUNTER — Emergency Department (HOSPITAL_COMMUNITY)
Admission: EM | Admit: 2013-08-22 | Discharge: 2013-08-23 | Disposition: A | Discharge status: Home / Self Care | Payer: Medicare HMO | Source: Home / Self Care | Attending: Emergency Medicine | Admitting: Emergency Medicine

## 2013-08-22 DIAGNOSIS — E871 Hypo-osmolality and hyponatremia: Secondary | ICD-10-CM | POA: Insufficient documentation

## 2013-08-22 DIAGNOSIS — M545 Low back pain, unspecified: Secondary | ICD-10-CM | POA: Insufficient documentation

## 2013-08-22 DIAGNOSIS — F410 Panic disorder [episodic paroxysmal anxiety] without agoraphobia: Secondary | ICD-10-CM | POA: Insufficient documentation

## 2013-08-22 DIAGNOSIS — Z8553 Personal history of malignant neoplasm of renal pelvis: Secondary | ICD-10-CM | POA: Insufficient documentation

## 2013-08-22 DIAGNOSIS — R739 Hyperglycemia, unspecified: Secondary | ICD-10-CM

## 2013-08-22 DIAGNOSIS — Z853 Personal history of malignant neoplasm of breast: Secondary | ICD-10-CM | POA: Insufficient documentation

## 2013-08-22 DIAGNOSIS — Z87448 Personal history of other diseases of urinary system: Secondary | ICD-10-CM | POA: Insufficient documentation

## 2013-08-22 DIAGNOSIS — Z8541 Personal history of malignant neoplasm of cervix uteri: Secondary | ICD-10-CM | POA: Insufficient documentation

## 2013-08-22 DIAGNOSIS — M7918 Myalgia, other site: Secondary | ICD-10-CM

## 2013-08-22 DIAGNOSIS — Z794 Long term (current) use of insulin: Secondary | ICD-10-CM | POA: Insufficient documentation

## 2013-08-22 DIAGNOSIS — Z862 Personal history of diseases of the blood and blood-forming organs and certain disorders involving the immune mechanism: Secondary | ICD-10-CM | POA: Insufficient documentation

## 2013-08-22 DIAGNOSIS — E039 Hypothyroidism, unspecified: Secondary | ICD-10-CM | POA: Insufficient documentation

## 2013-08-22 DIAGNOSIS — E119 Type 2 diabetes mellitus without complications: Secondary | ICD-10-CM | POA: Insufficient documentation

## 2013-08-22 DIAGNOSIS — Z8781 Personal history of (healed) traumatic fracture: Secondary | ICD-10-CM | POA: Insufficient documentation

## 2013-08-22 LAB — CBC WITH DIFFERENTIAL/PLATELET
Basophils Absolute: 0 10*3/uL (ref 0.0–0.1)
Basophils Relative: 0 % (ref 0–1)
Eosinophils Absolute: 0 10*3/uL (ref 0.0–0.7)
Eosinophils Relative: 0 % (ref 0–5)
HCT: 36.9 % (ref 36.0–46.0)
Hemoglobin: 11.7 g/dL — ABNORMAL LOW (ref 12.0–15.0)
Lymphocytes Relative: 18 % (ref 12–46)
Lymphs Abs: 1.6 10*3/uL (ref 0.7–4.0)
MCH: 26.6 pg (ref 26.0–34.0)
MCHC: 31.7 g/dL (ref 30.0–36.0)
MCV: 83.9 fL (ref 78.0–100.0)
Monocytes Absolute: 0.6 10*3/uL (ref 0.1–1.0)
Monocytes Relative: 7 % (ref 3–12)
Neutro Abs: 7 10*3/uL (ref 1.7–7.7)
Neutrophils Relative %: 75 % (ref 43–77)
Platelets: 264 10*3/uL (ref 150–400)
RBC: 4.4 MIL/uL (ref 3.87–5.11)
RDW: 13.7 % (ref 11.5–15.5)
WBC: 9.3 10*3/uL (ref 4.0–10.5)

## 2013-08-22 LAB — BASIC METABOLIC PANEL
BUN: 19 mg/dL (ref 6–23)
CO2: 25 mEq/L (ref 19–32)
Calcium: 8.9 mg/dL (ref 8.4–10.5)
Chloride: 93 mEq/L — ABNORMAL LOW (ref 96–112)
Creatinine, Ser: 1.71 mg/dL — ABNORMAL HIGH (ref 0.50–1.10)
GFR calc Af Amer: 42 mL/min — ABNORMAL LOW (ref >90)
GFR calc non Af Amer: 37 mL/min — ABNORMAL LOW (ref >90)
Glucose, Bld: 450 mg/dL — ABNORMAL HIGH (ref 70–99)
Potassium: 4.4 mEq/L (ref 3.5–5.1)
Sodium: 128 mEq/L — ABNORMAL LOW (ref 135–145)

## 2013-08-22 MED ORDER — SODIUM CHLORIDE 0.9 % IV BOLUS (SEPSIS)
1000.0000 mL | Freq: Once | INTRAVENOUS | Status: AC
  Administered 2013-08-23: 1000 mL via INTRAVENOUS

## 2013-08-22 MED ORDER — MORPHINE SULFATE 2 MG/ML IJ SOLN
2.0000 mg | Freq: Once | INTRAMUSCULAR | Status: AC
  Administered 2013-08-23: 2 mg via INTRAVENOUS
  Filled 2013-08-22: qty 1

## 2013-08-22 MED ORDER — MORPHINE SULFATE 4 MG/ML IJ SOLN: 2.0000 mg | Freq: Once | INTRAMUSCULAR | Status: DC

## 2013-08-22 NOTE — ED Provider Notes (Signed)
CSN: [id number]     Arrival date & time 08/22/13  2114 History  This chart was scribed for [name], MD, by Neta Ehlers, ED Scribe. This patient was seen in room APA08/APA08 and the patient's care was started at 9:45 PM.   First MD Initiated Contact with Patient 08/22/13 2134     Chief Complaint  Patient presents with  . rectal issue     HPI HPI Comments: [name] is a 37 y.o. female, with a h/o DM and renal transplantation, who presents to the Emergency Department complaining of gradually-increasing pain to her hips and buttocks which began after she mis-stepped at [place] earlier today. She describes the pain as "pulling," and she rates the pain to her buttocks as 9/10. She has baseline nausea and emesis due to gastroentitis. The pt denies numbness to her hips or buttocks. She also denies any foreign bodies to her rectum. She is currently on immunosuppression medications including prednisone, prograf, and myfortic for renal transplantations which occurred in 1999 and 2011.  Her last creatine was 1.64; that was two months ago. She reports that she has had DM for two years, and that her glucose levels have been higher than her baseline; it was 166 fasting today. Her A1C was 8.7 last month.  The pt states she drives approximately 100 miles a day as a part of her job; she denies long-distance trips.  Past Medical History  Diagnosis Date  . Chronic anemia   . Panic disorder   . Crohn's disease   . Anxiety     OCD  . 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 Fracture 05/01/2013    Right foot, in cast  . Hypothyroidism     parathroid runs low  . Renal disorder   . History of blood transfusion 2 years ago  . Cancer 2001    renal cell , breast cancer both breast 2011, cervical cancer 2013  . Complication of anesthesia     hard to sedate dr hung aware   Past Surgical History  Procedure Laterality Date  . Nephrectomy transplanted organ  1999 and 2011  . Parathyroidectomy   02-20-2001  . Knee surgery Left april 2003    arthroscopy  . Cystoscopy  02/13/2012    Procedure: CYSTOSCOPY;  Surgeon: Franchot Gallo, MD;  Location: [facility];  Service: Urology;  Laterality: N/A;  insertion of ureteral catheter , removed per Dr Diona Fanti   . Tubal ligation  02-19-2000  . Abdominal hysterectomy  02/13/2012    Procedure: HYSTERECTOMY ABDOMINAL;  Surgeon: Cyril Mourning, MD;  Location: [facility];  Service: Gynecology;  Laterality: N/A;  . Breast lumpectomy and reconstruction Bilateral 2011  . Esophageal manometry N/A 08/10/2013    Procedure: ESOPHAGEAL MANOMETRY (EM);  Surgeon: Beryle Beams, MD;  Location: [facility];  Service: Endoscopy;  Laterality: N/A;   History reviewed. No pertinent family history. History  Substance Use Topics  . Smoking status: Never Smoker   . Smokeless tobacco: Never Used  . Alcohol Use: No   No OB history provided.  Review of Systems  A complete 10 system review of systems was obtained, and all systems were negative except where indicated in the HPI and PE.    Allergies  Barium-containing compounds; Dilaudid; Hydromorphone; Ivp dye; Shellfish allergy; Vancomycin; Adhesive; Infed; Sulfa antibiotics; and Iopamidol  Home Medications   Current Outpatient Rx  Name  Route  Sig  Dispense  Refill  . atorvastatin (LIPITOR) 20 MG  tablet   Oral   Take 20 mg by mouth at bedtime.         . busPIRone (BUSPAR) 15 MG tablet   Oral   Take 15 mg by mouth 4 (four) times daily.         . calcitRIOL (ROCALTROL) 0.25 MCG capsule   Oral   Take 1.75 mcg by mouth every morning.         . calcium carbonate (OS-CAL - DOSED IN MG OF ELEMENTAL CALCIUM) 1250 MG tablet   Oral   Take 1 tablet by mouth 3 (three) times daily.         . cetirizine (ZYRTEC) 10 MG tablet   Oral   Take 10 mg by mouth every evening.         . cilostazol (PLETAL) 50 MG tablet   Oral   Take 50 mg by mouth 2 (two) times daily.         Marland Kitchen FLUoxetine (PROZAC) 20 MG  capsule   Oral   Take 60 mg by mouth every morning.         . hydrOXYzine (ATARAX/VISTARIL) 25 MG tablet   Oral   Take 25-50 mg by mouth 3 (three) times daily as needed for itching.         . insulin aspart (NOVOLOG) 100 UNIT/ML injection   Subcutaneous   Inject 20-25 Units into the skin 3 (three) times daily before meals. Inject 20 units SQ with breakfast and lunch, then 25 units daily SQ with dinner         . insulin glargine (LANTUS) 100 UNIT/ML injection   Subcutaneous   Inject 25 Units into the skin every morning.         . Insulin Pen Needle (NOVOFINE AUTOCOVER) 30G X 8 MM MISC   Subcutaneous   Inject 10 each into the skin as needed.   100 each   11   . Linaclotide (LINZESS) 145 MCG CAPS   Oral   Take 145 mcg by mouth daily as needed (pain).          Marland Kitchen metoCLOPramide (REGLAN) 5 MG tablet   Oral   Take 5 mg by mouth 4 (four) times daily. Frequency unspecified         . mycophenolate (MYFORTIC) 180 MG EC tablet   Oral   Take 540 mg by mouth 2 (two) times daily.         Marland Kitchen omeprazole (PRILOSEC) 40 MG capsule   Oral   Take 40 mg by mouth 2 (two) times daily.          . Oxycodone HCl 10 MG TABS   Oral   Take 10 mg by mouth 3 (three) times daily as needed. For pain         . predniSONE (DELTASONE) 10 MG tablet   Oral   Take 10 mg by mouth daily.         . promethazine (PHENERGAN) 25 MG tablet   Oral   Take 25-50 mg by mouth every 6 (six) hours as needed. For nausea         . tacrolimus (PROGRAF) 0.5 MG capsule   Oral   Take 3.5 mg by mouth 2 (two) times daily. Take with 1mg  capsules for total of 3.5mg  total dose twice daily         . tacrolimus (PROGRAF) 1 MG capsule   Oral   Take 3.5 mg by mouth 2 (two) times daily. Pt takes  a total of 3.5 mg         . tiotropium (SPIRIVA) 18 MCG inhalation capsule   Inhalation   Place 18 mcg into inhaler and inhale daily.          Triage Vitals: BP 153/76  Pulse 99  Temp(Src) 98.4 F (36.9  C) (Oral)  Resp 16  Ht 5\' 3"  (1.6 m)  Wt 261 lb (118.389 kg)  BMI 46.25 kg/m2  SpO2 97%  LMP 01/30/2012  Physical Exam  Nursing note and vitals reviewed. Constitutional: She is oriented to person, place, and time. She appears well-developed and well-nourished. No distress.  HENT:  Head: Normocephalic and atraumatic.  Eyes: EOM are normal. Pupils are equal, round, and reactive to light.  Neck: Normal range of motion. Neck supple. No tracheal deviation present.  Cardiovascular: Normal rate, regular rhythm and normal heart sounds.   No murmur heard. Pulmonary/Chest: Effort normal and breath sounds normal. No respiratory distress. She has no wheezes.  Abdominal:  Very soft abdomen, no tenderness, no guarding  Genitourinary:  No external hemorrhoids. No fissures externally.    Tenderness at 12 on rectal exam, but no palpated masses.   Chaperone present for exam. No external hemorrhoids, nonthrombosed hemorrhoids, no fissures, no focal tenderness, induration or fluctuance. No erythema of the skin.  Musculoskeletal: Normal range of motion.  Tender palpation across the lower back as well as around the coccyx. There is also tenderness to palpation over both buttocks  Neurological: She is alert and oriented to person, place, and time.  The patient moves her bilateral lower extremities without difficulty, she has no weakness or numbness of the lower extremities  Skin: Skin is warm and dry.  No changes in the skin, no redness, no warmth, no induration  Psychiatric: She has a normal mood and affect. Her behavior is normal.    ED Course  Procedures (including critical care time)  DIAGNOSTIC STUDIES:  Oxygen Saturation is 97% on room air, normal by my interpretation.    COORDINATION OF CARE:  9:54 PM- Discussed treatment plan with patient, and the patient agreed to the plan. The pt declined   Labs Review Labs Reviewed  CBC WITH DIFFERENTIAL - Abnormal; Notable for the following:     Hemoglobin 11.7 (*)    All other components within normal limits  BASIC METABOLIC PANEL - Abnormal; Notable for the following:    Sodium 128 (*)    Chloride 93 (*)    Glucose, Bld 450 (*)    Creatinine, Ser 1.71 (*)    GFR calc non Af Amer 37 (*)    GFR calc Af Amer 42 (*)    All other components within normal limits  URINALYSIS, ROUTINE W REFLEX MICROSCOPIC   Imaging Review Ct Abdomen Pelvis Wo Contrast  08/22/2013   CLINICAL DATA:  Crohn's disease. History of renal cell cancer with nephrectomy and transplant.  EXAM: CT ABDOMEN AND PELVIS WITHOUT CONTRAST  TECHNIQUE: Multidetector CT imaging of the abdomen and pelvis was performed following the standard protocol without intravenous contrast.  COMPARISON:  11/08/2010  FINDINGS: Lung bases are clear. No effusions. Heart is normal size. Dense coronary artery calcifications, advanced for patient's age. Small pleural effusion.  Severely atrophic left kidney compatible with end-stage renal disease. Prior right nephrectomy. Liver, spleen, pancreas and adrenals have an unremarkable unenhanced appearance.  Atrophic right lower quadrant renal transplant with larger left lower quadrant renal transplant. No hydronephrosis.  Stomach, large and small bowel are decompressed and grossly unremarkable. Extensive  aortic and branch vessels calcifications. Prior hysterectomy. No adnexal masses. Urinary bladder is unremarkable.  No acute bony abnormality.  IMPRESSION: No acute findings in the abdomen or pelvis.  Numerous chronic findings as above.   Electronically Signed   By: Rolm Baptise   On: 08/22/2013 23:37    MDM  No diagnosis found. The patient has no focal signs of infection other than her abnormal rectal exam however the CT scan of her abdomen and pelvis shows no signs of perirectal abscesses. In addition she has no fever, no tachycardia, no leukocytosis. She does have hyponatremia, states that this is a chronic finding for her. Her blood sugars also 450, she  states that this happens this evening because she did not take her insulin and ate a pop tart on the way out the door. She will be given some IV fluids, a dose of pain medication, she will need to be reevaluated. She does not have any focal neurologic abnormalities to suggest a spinal cord problem or injury at this time.  Change of shift - care signed out to Dr. Roxanne Mins   I personally performed the services described in this documentation, which was scribed in my presence. The recorded information has been reviewed and is accurate.        [name], MD 08/23/13 Dyann Kief

## 2013-08-22 NOTE — ED Notes (Signed)
Pt states she feels a knot on her rectum. Now c/o pain to her rectum and her hips.

## 2013-08-23 LAB — URINALYSIS, ROUTINE W REFLEX MICROSCOPIC
Bilirubin Urine: NEGATIVE
Glucose, UA: >1000 mg/dL — AB
Hgb urine dipstick: NEGATIVE
Ketones, ur: NEGATIVE mg/dL
Leukocytes, UA: NEGATIVE
Nitrite: NEGATIVE
Protein, ur: NEGATIVE mg/dL
Specific Gravity, Urine: 1.01 (ref 1.005–1.030)
Urobilinogen, UA: 0.2 mg/dL (ref 0.0–1.0)
pH: 6 (ref 5.0–8.0)

## 2013-08-23 LAB — URINE MICROSCOPIC-ADD ON

## 2013-08-23 MED ORDER — OXYCODONE-ACETAMINOPHEN 5-325 MG PO TABS: 1.0000 | ORAL_TABLET | ORAL | Status: DC | PRN

## 2013-08-23 MED ORDER — INSULIN ASPART 100 UNIT/ML ~~LOC~~ SOLN
10.0000 [IU] | Freq: Once | SUBCUTANEOUS | Status: AC
  Administered 2013-08-23: 10 [IU] via SUBCUTANEOUS
  Filled 2013-08-23: qty 1

## 2013-08-23 NOTE — ED Notes (Signed)
Copy of CBC, UA and BMP given to patient at her request.

## 2013-08-23 NOTE — ED Notes (Signed)
Patient states she "still feels like there is something there at her rectum"   Advised will inform MD

## 2013-08-23 NOTE — ED Notes (Signed)
Dispensed 6 pack of percocet to go as instructed . Advised her to take as directed on label.

## 2013-08-23 NOTE — ED Provider Notes (Signed)
Urinalysis has come back unremarkable. Laboratory workup is significant for mild hyponatremia some of which is accounted for by her hyperglycemia. She's given a dose of insulin for her hyperglycemia and is discharged with prescription for oxycodone-acetaminophen for pain. She is referred back to her PCP.  Delora Fuel, MD [phone]

## 2013-08-24 ENCOUNTER — Encounter (HOSPITAL_COMMUNITY): Payer: Self-pay | Admitting: *Deleted

## 2013-08-24 ENCOUNTER — Inpatient Hospital Stay (HOSPITAL_COMMUNITY)
Admission: EM | Admit: 2013-08-24 | Discharge: 2013-08-27 | DRG: 348 | Disposition: A | Discharge status: Home / Self Care | Payer: Medicare HMO | Attending: Family Medicine | Admitting: Family Medicine

## 2013-08-24 DIAGNOSIS — Z881 Allergy status to other antibiotic agents status: Secondary | ICD-10-CM

## 2013-08-24 DIAGNOSIS — Z940 Kidney transplant status: Secondary | ICD-10-CM

## 2013-08-24 DIAGNOSIS — D649 Anemia, unspecified: Secondary | ICD-10-CM

## 2013-08-24 DIAGNOSIS — F411 Generalized anxiety disorder: Secondary | ICD-10-CM | POA: Diagnosis present

## 2013-08-24 DIAGNOSIS — K3184 Gastroparesis: Secondary | ICD-10-CM | POA: Diagnosis present

## 2013-08-24 DIAGNOSIS — K611 Rectal abscess: Secondary | ICD-10-CM | POA: Diagnosis present

## 2013-08-24 DIAGNOSIS — M545 Low back pain, unspecified: Secondary | ICD-10-CM

## 2013-08-24 DIAGNOSIS — D638 Anemia in other chronic diseases classified elsewhere: Secondary | ICD-10-CM | POA: Diagnosis present

## 2013-08-24 DIAGNOSIS — Z885 Allergy status to narcotic agent status: Secondary | ICD-10-CM

## 2013-08-24 DIAGNOSIS — E039 Hypothyroidism, unspecified: Secondary | ICD-10-CM | POA: Diagnosis present

## 2013-08-24 DIAGNOSIS — IMO0002 Reserved for concepts with insufficient information to code with codable children: Secondary | ICD-10-CM

## 2013-08-24 DIAGNOSIS — K612 Anorectal abscess: Principal | ICD-10-CM | POA: Diagnosis present

## 2013-08-24 DIAGNOSIS — F410 Panic disorder [episodic paroxysmal anxiety] without agoraphobia: Secondary | ICD-10-CM | POA: Diagnosis present

## 2013-08-24 DIAGNOSIS — G4733 Obstructive sleep apnea (adult) (pediatric): Secondary | ICD-10-CM

## 2013-08-24 DIAGNOSIS — K5090 Crohn's disease, unspecified, without complications: Secondary | ICD-10-CM

## 2013-08-24 DIAGNOSIS — M1711 Unilateral primary osteoarthritis, right knee: Secondary | ICD-10-CM

## 2013-08-24 DIAGNOSIS — Z91041 Radiographic dye allergy status: Secondary | ICD-10-CM

## 2013-08-24 DIAGNOSIS — E1149 Type 2 diabetes mellitus with other diabetic neurological complication: Secondary | ICD-10-CM | POA: Diagnosis present

## 2013-08-24 DIAGNOSIS — E119 Type 2 diabetes mellitus without complications: Secondary | ICD-10-CM | POA: Diagnosis present

## 2013-08-24 DIAGNOSIS — F32A Depression, unspecified: Secondary | ICD-10-CM

## 2013-08-24 DIAGNOSIS — N186 End stage renal disease: Secondary | ICD-10-CM

## 2013-08-24 DIAGNOSIS — Z8541 Personal history of malignant neoplasm of cervix uteri: Secondary | ICD-10-CM

## 2013-08-24 DIAGNOSIS — I10 Essential (primary) hypertension: Secondary | ICD-10-CM

## 2013-08-24 DIAGNOSIS — N183 Chronic kidney disease, stage 3 unspecified: Secondary | ICD-10-CM | POA: Diagnosis present

## 2013-08-24 DIAGNOSIS — F329 Major depressive disorder, single episode, unspecified: Secondary | ICD-10-CM

## 2013-08-24 DIAGNOSIS — Z882 Allergy status to sulfonamides status: Secondary | ICD-10-CM

## 2013-08-24 DIAGNOSIS — Z6841 Body Mass Index (BMI) 40.0 and over, adult: Secondary | ICD-10-CM

## 2013-08-24 DIAGNOSIS — Z853 Personal history of malignant neoplasm of breast: Secondary | ICD-10-CM

## 2013-08-24 DIAGNOSIS — Z79899 Other long term (current) drug therapy: Secondary | ICD-10-CM

## 2013-08-24 DIAGNOSIS — Z91013 Allergy to seafood: Secondary | ICD-10-CM

## 2013-08-24 DIAGNOSIS — F429 Obsessive-compulsive disorder, unspecified: Secondary | ICD-10-CM

## 2013-08-24 DIAGNOSIS — I129 Hypertensive chronic kidney disease with stage 1 through stage 4 chronic kidney disease, or unspecified chronic kidney disease: Secondary | ICD-10-CM | POA: Diagnosis present

## 2013-08-24 DIAGNOSIS — Z905 Acquired absence of kidney: Secondary | ICD-10-CM

## 2013-08-24 DIAGNOSIS — Z85528 Personal history of other malignant neoplasm of kidney: Secondary | ICD-10-CM

## 2013-08-24 DIAGNOSIS — E785 Hyperlipidemia, unspecified: Secondary | ICD-10-CM

## 2013-08-24 DIAGNOSIS — D72829 Elevated white blood cell count, unspecified: Secondary | ICD-10-CM | POA: Diagnosis present

## 2013-08-24 DIAGNOSIS — Z794 Long term (current) use of insulin: Secondary | ICD-10-CM

## 2013-08-24 DIAGNOSIS — Z90710 Acquired absence of both cervix and uterus: Secondary | ICD-10-CM

## 2013-08-24 DIAGNOSIS — F4001 Agoraphobia with panic disorder: Secondary | ICD-10-CM

## 2013-08-24 LAB — CBC WITH DIFFERENTIAL/PLATELET
Basophils Absolute: 0 10*3/uL (ref 0.0–0.1)
Basophils Relative: 0 % (ref 0–1)
Eosinophils Absolute: 0.1 10*3/uL (ref 0.0–0.7)
Eosinophils Relative: 0 % (ref 0–5)
HCT: 39.3 % (ref 36.0–46.0)
Hemoglobin: 12.7 g/dL (ref 12.0–15.0)
Lymphocytes Relative: 13 % (ref 12–46)
Lymphs Abs: 2 10*3/uL (ref 0.7–4.0)
MCH: 27.1 pg (ref 26.0–34.0)
MCHC: 32.3 g/dL (ref 30.0–36.0)
MCV: 84 fL (ref 78.0–100.0)
Monocytes Absolute: 1.1 10*3/uL — ABNORMAL HIGH (ref 0.1–1.0)
Monocytes Relative: 8 % (ref 3–12)
Neutro Abs: 11.8 10*3/uL — ABNORMAL HIGH (ref 1.7–7.7)
Neutrophils Relative %: 79 % — ABNORMAL HIGH (ref 43–77)
Platelets: 271 10*3/uL (ref 150–400)
RBC: 4.68 MIL/uL (ref 3.87–5.11)
RDW: 13.8 % (ref 11.5–15.5)
WBC: 15 10*3/uL — ABNORMAL HIGH (ref 4.0–10.5)

## 2013-08-24 LAB — BASIC METABOLIC PANEL
BUN: 19 mg/dL (ref 6–23)
CO2: 24 mEq/L (ref 19–32)
Calcium: 9.7 mg/dL (ref 8.4–10.5)
Chloride: 100 mEq/L (ref 96–112)
Creatinine, Ser: 1.87 mg/dL — ABNORMAL HIGH (ref 0.50–1.10)
GFR calc Af Amer: 38 mL/min — ABNORMAL LOW (ref >90)
GFR calc non Af Amer: 33 mL/min — ABNORMAL LOW (ref >90)
Glucose, Bld: 101 mg/dL — ABNORMAL HIGH (ref 70–99)
Potassium: 3.6 mEq/L (ref 3.5–5.1)
Sodium: 139 mEq/L (ref 135–145)

## 2013-08-24 LAB — GLUCOSE, CAPILLARY: Glucose-Capillary: 60 mg/dL — ABNORMAL LOW (ref 70–99)

## 2013-08-24 MED ORDER — SODIUM CHLORIDE 0.9 % IV SOLN
3.0000 g | Freq: Four times a day (QID) | INTRAVENOUS | Status: DC
  Administered 2013-08-24 – 2013-08-27 (×9): 3 g via INTRAVENOUS
  Filled 2013-08-24 (×11): qty 3

## 2013-08-24 MED ORDER — MORPHINE SULFATE 4 MG/ML IJ SOLN
4.0000 mg | Freq: Once | INTRAMUSCULAR | Status: AC
  Administered 2013-08-24: 4 mg via INTRAVENOUS
  Filled 2013-08-24: qty 1

## 2013-08-24 MED ORDER — OXYCODONE-ACETAMINOPHEN 5-325 MG PO TABS
1.0000 | ORAL_TABLET | Freq: Once | ORAL | Status: AC
  Administered 2013-08-24: 1 via ORAL
  Filled 2013-08-24: qty 1

## 2013-08-24 NOTE — ED Notes (Signed)
Spoke to Dr Darrick Meigs regarding pt's current NPO Status.  Pt is to be NPO after midnight for procedure in the am.

## 2013-08-24 NOTE — ED Notes (Addendum)
Pt thinks she has a rectal abscess, Seen here on Saturday Today had fever 102. .  Had a ct scan of abd.

## 2013-08-24 NOTE — H&P (Signed)
PCP:   PICKARD,WARREN TOM, MD   Chief Complaint:  Rectal pain  HPI: 37 year old female with a history of chronic end-stage renal disease status post renal transplant, diabetes mellitus, hypertension, chronic disease, gastroparesis who came to the ED with rectal pain and fever. Patient was earlier seen in the ED on 08/22/2013 when she had a CT scan of the abdomen pelvis which showed no acute findings. And patient was discharged, today patient came back and says the pain has worsened and now has fever and chills. She rates the rectal pain as 10/12 in intensity. She denies any nausea vomiting or diarrhea. She has not moved her bowels since Saturday. She denies chest pain shortness of breath.  Allergies:   Allergies  Allergen Reactions  . Barium-Containing Compounds Shortness Of Breath  . Dilaudid [Hydromorphone Hcl] Anaphylaxis  . Hydromorphone Shortness Of Breath  . Ivp Dye [Iodinated Diagnostic Agents] Anaphylaxis  . Shellfish Allergy Shortness Of Breath and Itching    Causes red, patchy areas to form  . Vancomycin Anaphylaxis  . Adhesive [Tape] Other (See Comments)    Dermatitis   . Infed [Iron Dextran] Itching  . Sulfa Antibiotics Hives  . Iopamidol Itching and Rash      Past Medical History  Diagnosis Date  . Chronic anemia   . Panic disorder   . Crohn's disease   . Anxiety     OCD  . 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 Fracture 05/01/2013    Right foot, in cast  . Hypothyroidism     parathroid runs low  . History of blood transfusion 2 years ago  . Complication of anesthesia     hard to sedate dr hung aware  . Cancer 2001    renal cell , breast cancer both breast 2011, cervical cancer 2013  . Renal disorder     Past Surgical History  Procedure Laterality Date  . Nephrectomy transplanted organ  1999 and 2011  . Parathyroidectomy  02-20-2001  . Knee surgery Left april 2003    arthroscopy  . Cystoscopy  02/13/2012    Procedure: CYSTOSCOPY;  Surgeon: Franchot Gallo, MD;  Location: [facility];  Service: Urology;  Laterality: N/A;  insertion of ureteral catheter , removed per Dr Diona Fanti   . Tubal ligation  02-19-2000  . Abdominal hysterectomy  02/13/2012    Procedure: HYSTERECTOMY ABDOMINAL;  Surgeon: Cyril Mourning, MD;  Location: [facility];  Service: Gynecology;  Laterality: N/A;  . Breast lumpectomy and reconstruction Bilateral 2011  . Esophageal manometry N/A 08/10/2013    Procedure: ESOPHAGEAL MANOMETRY (EM);  Surgeon: Beryle Beams, MD;  Location: [facility];  Service: Endoscopy;  Laterality: N/A;    Prior to Admission medications   Medication Sig Start Date End Date Taking? Authorizing Provider  atorvastatin (LIPITOR) 20 MG tablet Take 20 mg by mouth at bedtime.   Yes Historical Provider, MD  busPIRone (BUSPAR) 15 MG tablet Take 15 mg by mouth 2 (two) times daily.  05/05/13  Yes Elmarie Shiley, NP  calcitRIOL (ROCALTROL) 0.25 MCG capsule Take 1.75 mcg by mouth every morning. 05/05/13  Yes Elmarie Shiley, NP  calcium carbonate (OS-CAL - DOSED IN MG OF ELEMENTAL CALCIUM) 1250 MG tablet Take 1 tablet by mouth 3 (three) times daily. 05/05/13  Yes Elmarie Shiley, NP  cetirizine (ZYRTEC) 10 MG tablet Take 10 mg by mouth every evening. 05/05/13  Yes Elmarie Shiley, NP  cilostazol (PLETAL) 50 MG tablet Take 50 mg by mouth 2 (two) times  daily. 05/05/13  Yes Elmarie Shiley, NP  FLUoxetine (PROZAC) 20 MG capsule Take 60 mg by mouth every morning.   Yes Historical Provider, MD  hydrOXYzine (ATARAX/VISTARIL) 25 MG tablet Take 50 mg by mouth at bedtime.    Yes Historical Provider, MD  insulin aspart (NOVOLOG) 100 UNIT/ML injection Inject 15 Units into the skin 3 (three) times daily before meals.    Yes Historical Provider, MD  insulin glargine (LANTUS) 100 UNIT/ML injection Inject 45 Units into the skin at bedtime.  05/15/13  Yes Susy Frizzle, MD  Linaclotide [facility]) 145 MCG CAPS Take 145 mcg by mouth daily as needed (pain).    Yes Historical Provider, MD  metoCLOPramide  (REGLAN) 5 MG tablet Take 5 mg by mouth 3 (three) times daily. Frequency unspecified 05/05/13  Yes Elmarie Shiley, NP  mycophenolate (MYFORTIC) 180 MG EC tablet Take 540 mg by mouth 2 (two) times daily. 05/05/13  Yes Elmarie Shiley, NP  omeprazole (PRILOSEC) 40 MG capsule Take 40 mg by mouth 2 (two) times daily.    Yes Historical Provider, MD  Oxycodone HCl 10 MG TABS Take 10 mg by mouth 3 (three) times daily as needed. For pain 05/08/13  Yes Susy Frizzle, MD  oxyCODONE-acetaminophen (PERCOCET/ROXICET) 5-325 MG per tablet Take 1 tablet by mouth every 4 (four) hours as needed for pain. [id number]  Yes Delora Fuel, MD  predniSONE (DELTASONE) 10 MG tablet Take 10 mg by mouth daily.   Yes Historical Provider, MD  promethazine (PHENERGAN) 25 MG tablet Take 25-50 mg by mouth every 6 (six) hours as needed. For nausea 05/05/13  Yes Elmarie Shiley, NP  tacrolimus (PROGRAF) 0.5 MG capsule Take 3.5 mg by mouth 2 (two) times daily. Take with 1mg  capsules for total of 3.5mg  total dose twice daily 05/05/13  Yes Elmarie Shiley, NP  tacrolimus (PROGRAF) 1 MG capsule Take 3.5 mg by mouth 2 (two) times daily. Pt takes a total of 3.5 mg   Yes Historical Provider, MD  tiotropium (SPIRIVA) 18 MCG inhalation capsule Place 18 mcg into inhaler and inhale daily.   Yes Historical Provider, MD    Social History:  reports that she has never smoked. She has never used smokeless tobacco. She reports that she does not drink alcohol or use illicit drugs.  History reviewed. No pertinent family history.   All the positives are listed in BOLD  Review of Systems:  HEENT: Headache, blurred vision, runny nose, sore throat Neck: Hypothyroidism, hyperthyroidism,,lymphadenopathy Chest : Shortness of breath, history of COPD, Asthma Heart : Chest pain, history of coronary arterey disease GI:  Nausea, vomiting, diarrhea, constipation, GERD GU: Dysuria, urgency, frequency of urination, hematuria Neuro: Stroke, seizures, syncope Psych: Depression,  anxiety, hallucinations   Physical Exam: Blood pressure 129/82, pulse 111, temperature 99.9 F (37.7 C), resp. rate 20, height 5\' 3"  (1.6 m), weight 118.389 kg (261 lb), last menstrual period 01/30/2012, SpO2 95.00%. Constitutional:   Patient is a well-developed and well-nourished *female in no acute distress and cooperative with exam. Head: Normocephalic and atraumatic Mouth: Mucus membranes moist Eyes: PERRL, EOMI, conjunctivae normal Neck: Supple, No Thyromegaly Cardiovascular: RRR, S1 normal, S2 normal Pulmonary/Chest: CTAB, no wheezes, rales, or rhonchi Abdominal: Soft. Non-tender, non-distended, bowel sounds are normal, no masses, organomegaly, or guarding present.  Neurological: A&O x3, Strenght is normal and symmetric bilaterally, cranial nerve II-XII are grossly intact, no focal motor deficit, sensory intact to light touch bilaterally.  Extremities : No Cyanosis, Clubbing or Edema Did not examine the anal  as it was examined by the ED physician  Labs on Admission:  Results for orders placed during the hospital encounter of 08/24/13 (from the past 48 hour(s))  BASIC METABOLIC PANEL     Status: Abnormal   Collection Time    08/24/13  9:21 PM      Result Value Range   Sodium 139  135 - 145 mEq/L   Comment: DELTA CHECK NOTED   Potassium 3.6  3.5 - 5.1 mEq/L   Comment: DELTA CHECK NOTED   Chloride 100  96 - 112 mEq/L   CO2 24  19 - 32 mEq/L   Glucose, Bld 101 (*) 70 - 99 mg/dL   BUN 19  6 - 23 mg/dL   Creatinine, Ser 1.87 (*) 0.50 - 1.10 mg/dL   Calcium 9.7  8.4 - 10.5 mg/dL   GFR calc non Af Amer 33 (*) >90 mL/min   GFR calc Af Amer 38 (*) >90 mL/min   Comment: (NOTE)     The eGFR has been calculated using the CKD EPI equation.     This calculation has not been validated in all clinical situations.     eGFR's persistently <90 mL/min signify possible Chronic Kidney     Disease.  CBC WITH DIFFERENTIAL     Status: Abnormal   Collection Time    08/24/13  9:21 PM       Result Value Range   WBC 15.0 (*) 4.0 - 10.5 K/uL   RBC 4.68  3.87 - 5.11 MIL/uL   Hemoglobin 12.7  12.0 - 15.0 g/dL   HCT 39.3  36.0 - 46.0 %   MCV 84.0  78.0 - 100.0 fL   MCH 27.1  26.0 - 34.0 pg   MCHC 32.3  30.0 - 36.0 g/dL   RDW 13.8  11.5 - 15.5 %   Platelets 271  150 - 400 K/uL   Neutrophils Relative % 79 (*) 43 - 77 %   Neutro Abs 11.8 (*) 1.7 - 7.7 K/uL   Lymphocytes Relative 13  12 - 46 %   Lymphs Abs 2.0  0.7 - 4.0 K/uL   Monocytes Relative 8  3 - 12 %   Monocytes Absolute 1.1 (*) 0.1 - 1.0 K/uL   Eosinophils Relative 0  0 - 5 %   Eosinophils Absolute 0.1  0.0 - 0.7 K/uL   Basophils Relative 0  0 - 1 %   Basophils Absolute 0.0  0.0 - 0.1 K/uL  GLUCOSE, CAPILLARY     Status: Abnormal   Collection Time    08/24/13 10:09 PM      Result Value Range   Glucose-Capillary 60 (*) 70 - 99 mg/dL    Radiological Exams on Admission: No results found.  Assessment/Plan Active Problems:   Type 2 diabetes mellitus   HTN (hypertension)   Crohn's disease   Perirectal abscess  Perirectal abscess Patient has been started on Unasyn, will continue 3 g IV every 6 hours We'll give morphine 4 mg IV every 4 hours when necessary for pain Patient will require surgical consult the morning for drainage of the abscess  Status post renal transplant We'll continue immunosuppressants including Myfortic, Prograf  Diabetes mellitus Will hold the Lantus at this time as patient's blood glucose dropped to 60 We'll start sliding scale insulin  DVT prophylaxis Lovenox   Code status: Full code  Family discussion: Discussed with patient in detail   Time Spent on Admission: 58 min  [facility] Pager:  [phone] 08/24/2013, 11:30 PM  If 7PM-7AM, please contact night-coverage  [email or web address]  Password [email or web address]

## 2013-08-24 NOTE — ED Provider Notes (Signed)
CSN: [id number]     Arrival date & time 08/24/13  2003 History   First MD Initiated Contact with Patient 08/24/13 2032     Chief Complaint  Patient presents with  . Abscess    Patient is a 37 y.o. female presenting with abscess. The history is provided by the patient.  Abscess Location:  Ano-genital Abscess quality: draining and painful   Duration:  2 days Progression:  Worsening Pain details:    Timing:  Constant   Progression:  Worsening Chronicity:  New Relieved by:  Nothing Exacerbated by: palpation/defecation. Associated symptoms: fever   Associated symptoms: no vomiting    Pt presents for rectal pain She reports that she has had rectal pain for over two days with some localized swelling.  She was seen in the ED recently, had CT scan and was discharged.  She reports pain has worsened, now she has fever/chills  Past Medical History  Diagnosis Date  . Chronic anemia   . Panic disorder   . Crohn's disease   . Anxiety     OCD  . 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 Fracture 05/01/2013    Right foot, in cast  . Hypothyroidism     parathroid runs low  . History of blood transfusion 2 years ago  . Complication of anesthesia     hard to sedate dr hung aware  . Cancer 2001    renal cell , breast cancer both breast 2011, cervical cancer 2013  . Renal disorder    Past Surgical History  Procedure Laterality Date  . Nephrectomy transplanted organ  1999 and 2011  . Parathyroidectomy  02-20-2001  . Knee surgery Left april 2003    arthroscopy  . Cystoscopy  02/13/2012    Procedure: CYSTOSCOPY;  Surgeon: Franchot Gallo, MD;  Location: [facility];  Service: Urology;  Laterality: N/A;  insertion of ureteral catheter , removed per Dr Diona Fanti   . Tubal ligation  02-19-2000  . Abdominal hysterectomy  02/13/2012    Procedure: HYSTERECTOMY ABDOMINAL;  Surgeon: Cyril Mourning, MD;  Location: [facility];  Service: Gynecology;  Laterality: N/A;  . Breast lumpectomy and reconstruction  Bilateral 2011  . Esophageal manometry N/A 08/10/2013    Procedure: ESOPHAGEAL MANOMETRY (EM);  Surgeon: Beryle Beams, MD;  Location: [facility];  Service: Endoscopy;  Laterality: N/A;   History reviewed. No pertinent family history. History  Substance Use Topics  . Smoking status: Never Smoker   . Smokeless tobacco: Never Used  . Alcohol Use: No   OB History   Grav Para Term Preterm Abortions TAB SAB Ect Mult Living                 Review of Systems  Constitutional: Positive for fever and chills.  Respiratory: Negative for cough.   Gastrointestinal: Positive for rectal pain. Negative for vomiting.  Genitourinary: Negative for dysuria.  Neurological: Negative for weakness.  All other systems reviewed and are negative.    Allergies  Barium-containing compounds; Dilaudid; Hydromorphone; Ivp dye; Shellfish allergy; Vancomycin; Adhesive; Infed; Sulfa antibiotics; and Iopamidol  Home Medications   Current Outpatient Rx  Name  Route  Sig  Dispense  Refill  . atorvastatin (LIPITOR) 20 MG tablet   Oral   Take 20 mg by mouth at bedtime.         . busPIRone (BUSPAR) 15 MG tablet   Oral   Take 15 mg by mouth 2 (two) times daily.          Marland Kitchen  calcitRIOL (ROCALTROL) 0.25 MCG capsule   Oral   Take 1.75 mcg by mouth every morning.         . calcium carbonate (OS-CAL - DOSED IN MG OF ELEMENTAL CALCIUM) 1250 MG tablet   Oral   Take 1 tablet by mouth 3 (three) times daily.         . cetirizine (ZYRTEC) 10 MG tablet   Oral   Take 10 mg by mouth every evening.         . cilostazol (PLETAL) 50 MG tablet   Oral   Take 50 mg by mouth 2 (two) times daily.         Marland Kitchen FLUoxetine (PROZAC) 20 MG capsule   Oral   Take 60 mg by mouth every morning.         . hydrOXYzine (ATARAX/VISTARIL) 25 MG tablet   Oral   Take 50 mg by mouth at bedtime.          . insulin aspart (NOVOLOG) 100 UNIT/ML injection   Subcutaneous   Inject 15 Units into the skin 3 (three) times  daily before meals.          . insulin glargine (LANTUS) 100 UNIT/ML injection   Subcutaneous   Inject 45 Units into the skin at bedtime.          . Linaclotide (LINZESS) 145 MCG CAPS   Oral   Take 145 mcg by mouth daily as needed (pain).          Marland Kitchen metoCLOPramide (REGLAN) 5 MG tablet   Oral   Take 5 mg by mouth 3 (three) times daily. Frequency unspecified         . mycophenolate (MYFORTIC) 180 MG EC tablet   Oral   Take 540 mg by mouth 2 (two) times daily.         Marland Kitchen omeprazole (PRILOSEC) 40 MG capsule   Oral   Take 40 mg by mouth 2 (two) times daily.          . Oxycodone HCl 10 MG TABS   Oral   Take 10 mg by mouth 3 (three) times daily as needed. For pain         . oxyCODONE-acetaminophen (PERCOCET/ROXICET) 5-325 MG per tablet   Oral   Take 1 tablet by mouth every 4 (four) hours as needed for pain.   15 tablet   0   . predniSONE (DELTASONE) 10 MG tablet   Oral   Take 10 mg by mouth daily.         . promethazine (PHENERGAN) 25 MG tablet   Oral   Take 25-50 mg by mouth every 6 (six) hours as needed. For nausea         . tacrolimus (PROGRAF) 0.5 MG capsule   Oral   Take 3.5 mg by mouth 2 (two) times daily. Take with 1mg  capsules for total of 3.5mg  total dose twice daily         . tacrolimus (PROGRAF) 1 MG capsule   Oral   Take 3.5 mg by mouth 2 (two) times daily. Pt takes a total of 3.5 mg         . tiotropium (SPIRIVA) 18 MCG inhalation capsule   Inhalation   Place 18 mcg into inhaler and inhale daily.          BP 129/82  Pulse 111  Temp(Src) 99.9 F (37.7 C)  Resp 20  Ht 5\' 3"  (1.6 m)  Wt 261 lb (118.389  kg)  BMI 46.25 kg/m2  SpO2 95%  LMP 01/30/2012 Physical Exam CONSTITUTIONAL: Well developed/well nourished HEAD: Normocephalic/atraumatic EYES: EOMI/PERRL ENMT: Mucous membranes moist NECK: supple no meningeal signs CV: S1/S2 noted, no murmurs/rubs/gallops noted LUNGS: Lungs are clear to auscultation bilaterally, no  apparent distress ABDOMEN: soft, nontender, no rebound or guarding Rectal - large perirectal abscess noted that is tender to palpation.  No active draining.  Chaperone present NEURO: Pt is awake/alert, moves all extremitiesx4 EXTREMITIES: pulses normal, full ROM SKIN: warm, color normal PSYCH: no abnormalities of mood noted  ED Course  Procedures  Labs Review Labs Reviewed  BASIC METABOLIC PANEL  CBC WITH DIFFERENTIAL   Imaging Review Ct Abdomen Pelvis Wo Contrast  08/22/2013   CLINICAL DATA:  Crohn's disease. History of renal cell cancer with nephrectomy and transplant.  EXAM: CT ABDOMEN AND PELVIS WITHOUT CONTRAST  TECHNIQUE: Multidetector CT imaging of the abdomen and pelvis was performed following the standard protocol without intravenous contrast.  COMPARISON:  11/08/2010  FINDINGS: Lung bases are clear. No effusions. Heart is normal size. Dense coronary artery calcifications, advanced for patient's age. Small pleural effusion.  Severely atrophic left kidney compatible with end-stage renal disease. Prior right nephrectomy. Liver, spleen, pancreas and adrenals have an unremarkable unenhanced appearance.  Atrophic right lower quadrant renal transplant with larger left lower quadrant renal transplant. No hydronephrosis.  Stomach, large and small bowel are decompressed and grossly unremarkable. Extensive aortic and branch vessels calcifications. Prior hysterectomy. No adnexal masses. Urinary bladder is unremarkable.  No acute bony abnormality.  IMPRESSION: No acute findings in the abdomen or pelvis.  Numerous chronic findings as above.   Electronically Signed   By: Rolm Baptise   On: 08/22/2013 23:37  9:25 PM Pt with h/o renal transplant and on multiple immunosuppressants, presents with worsening rectal pain.  She has had fever at home.  She appears to have perirectal abscess. Also given her immunosuppressed status will need IV antibiotics and can be seen by surgery in the morning.  Pt agreeable  with this plan Will call [facility] for admission  MDM  No diagnosis found. Nursing notes including past medical history and social history reviewed and considered in documentation Previous records reviewed and considered     Sharyon Cable, MD 08/24/13 2156

## 2013-08-24 NOTE — ED Notes (Signed)
Dr Darrick Meigs phoned and patient is npo after midnight. Offered healthy choice dinner but patient refused due to mushrooms. Family is going out to bring in food

## 2013-08-25 ENCOUNTER — Encounter (HOSPITAL_COMMUNITY): Payer: Self-pay | Admitting: *Deleted

## 2013-08-25 ENCOUNTER — Telehealth: Payer: Self-pay | Admitting: Gastroenterology

## 2013-08-25 DIAGNOSIS — N183 Chronic kidney disease, stage 3 unspecified: Secondary | ICD-10-CM

## 2013-08-25 DIAGNOSIS — K612 Anorectal abscess: Secondary | ICD-10-CM | POA: Diagnosis present

## 2013-08-25 DIAGNOSIS — E1149 Type 2 diabetes mellitus with other diabetic neurological complication: Secondary | ICD-10-CM | POA: Diagnosis present

## 2013-08-25 DIAGNOSIS — IMO0002 Reserved for concepts with insufficient information to code with codable children: Secondary | ICD-10-CM | POA: Diagnosis not present

## 2013-08-25 DIAGNOSIS — Z794 Long term (current) use of insulin: Secondary | ICD-10-CM | POA: Diagnosis not present

## 2013-08-25 DIAGNOSIS — I129 Hypertensive chronic kidney disease with stage 1 through stage 4 chronic kidney disease, or unspecified chronic kidney disease: Secondary | ICD-10-CM | POA: Diagnosis present

## 2013-08-25 DIAGNOSIS — Z881 Allergy status to other antibiotic agents status: Secondary | ICD-10-CM | POA: Diagnosis not present

## 2013-08-25 DIAGNOSIS — I10 Essential (primary) hypertension: Secondary | ICD-10-CM

## 2013-08-25 DIAGNOSIS — Z85528 Personal history of other malignant neoplasm of kidney: Secondary | ICD-10-CM | POA: Diagnosis not present

## 2013-08-25 DIAGNOSIS — F410 Panic disorder [episodic paroxysmal anxiety] without agoraphobia: Secondary | ICD-10-CM | POA: Diagnosis present

## 2013-08-25 DIAGNOSIS — E039 Hypothyroidism, unspecified: Secondary | ICD-10-CM | POA: Diagnosis present

## 2013-08-25 DIAGNOSIS — E119 Type 2 diabetes mellitus without complications: Secondary | ICD-10-CM

## 2013-08-25 DIAGNOSIS — D638 Anemia in other chronic diseases classified elsewhere: Secondary | ICD-10-CM | POA: Diagnosis present

## 2013-08-25 DIAGNOSIS — Z905 Acquired absence of kidney: Secondary | ICD-10-CM | POA: Diagnosis not present

## 2013-08-25 DIAGNOSIS — F411 Generalized anxiety disorder: Secondary | ICD-10-CM | POA: Diagnosis present

## 2013-08-25 DIAGNOSIS — Z91041 Radiographic dye allergy status: Secondary | ICD-10-CM | POA: Diagnosis not present

## 2013-08-25 DIAGNOSIS — Z853 Personal history of malignant neoplasm of breast: Secondary | ICD-10-CM | POA: Diagnosis not present

## 2013-08-25 DIAGNOSIS — D72829 Elevated white blood cell count, unspecified: Secondary | ICD-10-CM | POA: Diagnosis present

## 2013-08-25 DIAGNOSIS — F429 Obsessive-compulsive disorder, unspecified: Secondary | ICD-10-CM | POA: Diagnosis present

## 2013-08-25 DIAGNOSIS — Z885 Allergy status to narcotic agent status: Secondary | ICD-10-CM | POA: Diagnosis not present

## 2013-08-25 DIAGNOSIS — Z90710 Acquired absence of both cervix and uterus: Secondary | ICD-10-CM | POA: Diagnosis not present

## 2013-08-25 DIAGNOSIS — Z6841 Body Mass Index (BMI) 40.0 and over, adult: Secondary | ICD-10-CM | POA: Diagnosis not present

## 2013-08-25 DIAGNOSIS — Z8541 Personal history of malignant neoplasm of cervix uteri: Secondary | ICD-10-CM | POA: Diagnosis not present

## 2013-08-25 DIAGNOSIS — Z940 Kidney transplant status: Secondary | ICD-10-CM | POA: Diagnosis not present

## 2013-08-25 DIAGNOSIS — Z882 Allergy status to sulfonamides status: Secondary | ICD-10-CM | POA: Diagnosis not present

## 2013-08-25 DIAGNOSIS — Z91013 Allergy to seafood: Secondary | ICD-10-CM | POA: Diagnosis not present

## 2013-08-25 DIAGNOSIS — Z79899 Other long term (current) drug therapy: Secondary | ICD-10-CM | POA: Diagnosis not present

## 2013-08-25 DIAGNOSIS — K3184 Gastroparesis: Secondary | ICD-10-CM | POA: Diagnosis present

## 2013-08-25 LAB — CBC
HCT: 36.1 % (ref 36.0–46.0)
Hemoglobin: 11.5 g/dL — ABNORMAL LOW (ref 12.0–15.0)
MCH: 26.7 pg (ref 26.0–34.0)
MCHC: 31.9 g/dL (ref 30.0–36.0)
MCV: 83.8 fL (ref 78.0–100.0)
Platelets: 258 10*3/uL (ref 150–400)
RBC: 4.31 MIL/uL (ref 3.87–5.11)
RDW: 13.7 % (ref 11.5–15.5)
WBC: 15 10*3/uL — ABNORMAL HIGH (ref 4.0–10.5)

## 2013-08-25 LAB — GLUCOSE, CAPILLARY
Glucose-Capillary: 169 mg/dL — ABNORMAL HIGH (ref 70–99)
Glucose-Capillary: 151 mg/dL — ABNORMAL HIGH (ref 70–99)
Glucose-Capillary: 304 mg/dL — ABNORMAL HIGH (ref 70–99)
Glucose-Capillary: 168 mg/dL — ABNORMAL HIGH (ref 70–99)

## 2013-08-25 LAB — COMPREHENSIVE METABOLIC PANEL
ALT: 7 U/L (ref 0–35)
AST: 8 U/L (ref 0–37)
Albumin: 3.3 g/dL — ABNORMAL LOW (ref 3.5–5.2)
Alkaline Phosphatase: 89 U/L (ref 39–117)
BUN: 18 mg/dL (ref 6–23)
CO2: 24 mEq/L (ref 19–32)
Calcium: 8.6 mg/dL (ref 8.4–10.5)
Chloride: 98 mEq/L (ref 96–112)
Creatinine, Ser: 1.65 mg/dL — ABNORMAL HIGH (ref 0.50–1.10)
GFR calc Af Amer: 44 mL/min — ABNORMAL LOW (ref >90)
GFR calc non Af Amer: 38 mL/min — ABNORMAL LOW (ref >90)
Glucose, Bld: 202 mg/dL — ABNORMAL HIGH (ref 70–99)
Potassium: 3.8 mEq/L (ref 3.5–5.1)
Sodium: 133 mEq/L — ABNORMAL LOW (ref 135–145)
Total Bilirubin: 0.4 mg/dL (ref 0.3–1.2)
Total Protein: 7.1 g/dL (ref 6.0–8.3)

## 2013-08-25 LAB — SURGICAL PCR SCREEN
MRSA, PCR: NEGATIVE
Staphylococcus aureus: NEGATIVE

## 2013-08-25 LAB — MRSA PCR SCREENING: MRSA by PCR: NEGATIVE

## 2013-08-25 MED ORDER — TACROLIMUS 1 MG PO CAPS
3.5000 mg | ORAL_CAPSULE | Freq: Two times a day (BID) | ORAL | Status: DC
  Administered 2013-08-25 – 2013-08-26 (×5): 3.5 mg via ORAL
  Filled 2013-08-25 (×8): qty 1

## 2013-08-25 MED ORDER — BUSPIRONE HCL 5 MG PO TABS: 15.0000 mg | ORAL_TABLET | Freq: Two times a day (BID) | ORAL | Status: DC

## 2013-08-25 MED ORDER — CALCITRIOL 0.25 MCG PO CAPS
1.7500 ug | ORAL_CAPSULE | Freq: Every morning | ORAL | Status: DC
  Administered 2013-08-25 – 2013-08-26 (×2): 1.75 ug via ORAL
  Filled 2013-08-25: qty 7
  Filled 2013-08-25: qty 6
  Filled 2013-08-25: qty 1

## 2013-08-25 MED ORDER — CILOSTAZOL 50 MG PO TABS
50.0000 mg | ORAL_TABLET | Freq: Two times a day (BID) | ORAL | Status: DC
  Filled 2013-08-25 (×3): qty 1

## 2013-08-25 MED ORDER — INSULIN GLARGINE 100 UNIT/ML ~~LOC~~ SOLN
20.0000 [IU] | Freq: Every day | SUBCUTANEOUS | Status: DC
  Administered 2013-08-25: 20 [IU] via SUBCUTANEOUS
  Filled 2013-08-25 (×2): qty 0.2

## 2013-08-25 MED ORDER — TIOTROPIUM BROMIDE MONOHYDRATE 18 MCG IN CAPS
18.0000 ug | ORAL_CAPSULE | Freq: Every day | RESPIRATORY_TRACT | Status: DC
  Administered 2013-08-25 – 2013-08-27 (×2): 18 ug via RESPIRATORY_TRACT
  Filled 2013-08-25: qty 5

## 2013-08-25 MED ORDER — INSULIN ASPART 100 UNIT/ML ~~LOC~~ SOLN
0.0000 [IU] | Freq: Three times a day (TID) | SUBCUTANEOUS | Status: DC
  Administered 2013-08-25: 2 [IU] via SUBCUTANEOUS
  Administered 2013-08-25: 7 [IU] via SUBCUTANEOUS
  Administered 2013-08-26 (×2): 5 [IU] via SUBCUTANEOUS
  Administered 2013-08-27: 3 [IU] via SUBCUTANEOUS

## 2013-08-25 MED ORDER — LORATADINE 10 MG PO TABS
10.0000 mg | ORAL_TABLET | Freq: Every day | ORAL | Status: DC
  Administered 2013-08-25 – 2013-08-26 (×2): 10 mg via ORAL
  Filled 2013-08-25 (×2): qty 1

## 2013-08-25 MED ORDER — PREDNISONE 10 MG PO TABS
10.0000 mg | ORAL_TABLET | Freq: Every day | ORAL | Status: DC
Start: 2013-08-25 — End: 2013-08-27
  Administered 2013-08-25 – 2013-08-26 (×2): 10 mg via ORAL
  Filled 2013-08-25 (×3): qty 1

## 2013-08-25 MED ORDER — CHLORHEXIDINE GLUCONATE 4 % EX LIQD
1.0000 "application " | Freq: Once | CUTANEOUS | Status: AC
  Administered 2013-08-25: 1 via TOPICAL
  Filled 2013-08-25: qty 15

## 2013-08-25 MED ORDER — FLUOXETINE HCL 20 MG PO CAPS
60.0000 mg | ORAL_CAPSULE | Freq: Every morning | ORAL | Status: DC
Start: 2013-08-25 — End: 2013-08-27
  Administered 2013-08-25 – 2013-08-26 (×2): 60 mg via ORAL
  Filled 2013-08-25 (×2): qty 3

## 2013-08-25 MED ORDER — HYDROXYZINE HCL 25 MG PO TABS
50.0000 mg | ORAL_TABLET | Freq: Every day | ORAL | Status: DC
  Administered 2013-08-25 – 2013-08-26 (×3): 50 mg via ORAL
  Filled 2013-08-25 (×3): qty 2

## 2013-08-25 MED ORDER — INSULIN ASPART 100 UNIT/ML ~~LOC~~ SOLN
10.0000 [IU] | Freq: Three times a day (TID) | SUBCUTANEOUS | Status: DC
  Administered 2013-08-26 – 2013-08-27 (×3): 10 [IU] via SUBCUTANEOUS

## 2013-08-25 MED ORDER — TACROLIMUS 1 MG PO CAPS
ORAL_CAPSULE | ORAL | Status: AC
  Filled 2013-08-25: qty 3

## 2013-08-25 MED ORDER — SODIUM CHLORIDE 0.9 % IV SOLN: INTRAVENOUS | Status: DC

## 2013-08-25 MED ORDER — METOCLOPRAMIDE HCL 10 MG PO TABS
5.0000 mg | ORAL_TABLET | Freq: Three times a day (TID) | ORAL | Status: DC
  Administered 2013-08-25 – 2013-08-26 (×5): 5 mg via ORAL
  Filled 2013-08-25 (×6): qty 1

## 2013-08-25 MED ORDER — CILOSTAZOL 100 MG PO TABS
50.0000 mg | ORAL_TABLET | Freq: Two times a day (BID) | ORAL | Status: DC
  Administered 2013-08-25 – 2013-08-26 (×4): 50 mg via ORAL
  Filled 2013-08-25: qty 0.5
  Filled 2013-08-25 (×3): qty 1
  Filled 2013-08-25 (×3): qty 0.5
  Filled 2013-08-25: qty 1

## 2013-08-25 MED ORDER — CALCIUM CARBONATE 1250 (500 CA) MG PO TABS
1.0000 | ORAL_TABLET | Freq: Three times a day (TID) | ORAL | Status: DC
  Administered 2013-08-25 – 2013-08-26 (×5): 500 mg via ORAL
  Filled 2013-08-25 (×7): qty 1

## 2013-08-25 MED ORDER — CILOSTAZOL 100 MG PO TABS
ORAL_TABLET | ORAL | Status: AC
  Filled 2013-08-25: qty 1

## 2013-08-25 MED ORDER — ATORVASTATIN CALCIUM 20 MG PO TABS
20.0000 mg | ORAL_TABLET | Freq: Every day | ORAL | Status: DC
  Administered 2013-08-25 – 2013-08-26 (×3): 20 mg via ORAL
  Filled 2013-08-25 (×3): qty 1

## 2013-08-25 MED ORDER — ENOXAPARIN SODIUM 40 MG/0.4ML ~~LOC~~ SOLN
40.0000 mg | SUBCUTANEOUS | Status: DC
  Administered 2013-08-26: 40 mg via SUBCUTANEOUS
  Filled 2013-08-25 (×2): qty 0.4

## 2013-08-25 MED ORDER — MYCOPHENOLATE SODIUM 180 MG PO TBEC
540.0000 mg | DELAYED_RELEASE_TABLET | Freq: Two times a day (BID) | ORAL | Status: DC
  Administered 2013-08-25 – 2013-08-26 (×5): 540 mg via ORAL
  Filled 2013-08-25 (×8): qty 3

## 2013-08-25 MED ORDER — BUSPIRONE HCL 5 MG PO TABS
15.0000 mg | ORAL_TABLET | Freq: Two times a day (BID) | ORAL | Status: DC
  Administered 2013-08-25 – 2013-08-26 (×5): 15 mg via ORAL
  Filled 2013-08-25 (×5): qty 3

## 2013-08-25 MED ORDER — MORPHINE SULFATE 4 MG/ML IJ SOLN
4.0000 mg | INTRAMUSCULAR | Status: DC | PRN
  Administered 2013-08-25 – 2013-08-26 (×6): 4 mg via INTRAVENOUS
  Filled 2013-08-25 (×6): qty 1

## 2013-08-25 MED ORDER — SODIUM CHLORIDE 0.9 % IV SOLN
INTRAVENOUS | Status: AC
  Filled 2013-08-25: qty 3

## 2013-08-25 MED ORDER — OXYCODONE-ACETAMINOPHEN 5-325 MG PO TABS
1.0000 | ORAL_TABLET | ORAL | Status: DC | PRN
  Administered 2013-08-26: 1 via ORAL
  Filled 2013-08-25: qty 1

## 2013-08-25 NOTE — Progress Notes (Signed)
UR Chart Review Completed  

## 2013-08-25 NOTE — Progress Notes (Signed)
Inpatient Diabetes Program Recommendations  AACE/ADA: New Consensus Statement on Inpatient Glycemic Control (2013)  Target Ranges:  Prepandial:   less than 140 mg/dL      Peak postprandial:   less than 180 mg/dL (1-2 hours)      Critically ill patients:  140 - 180 mg/dL   Results for TYA, LISANTI (MRN [id number]) as of 08/25/2013 13:10  Ref. Range 08/24/2013 22:09 08/25/2013 07:22 08/25/2013 11:22  Glucose-Capillary Latest Range: 70-99 mg/dL 60 (L) 169 (H) 151 (H)   Inpatient Diabetes Program Recommendations Insulin - Basal: May want to consider ordering low basal insulin (much lower dose than taken as an outpatient; as an outpatient, patient takes Lantus 45 units QHS). Correction (SSI): Please consider increasing Novolog correction to moderate scale.  Note: Patient has a history of diabetes and she reports that she takes Lantus 45 units QHS, Novolog 15 units TID with meals, and and additional Novolog 1 unit for every 50 mg/dl above target of 150 mg/dl as an outpatient for diabetes management.  Currently, patient is ordered to receive Novolog 0-9 units AC for inpatient glycemic control.  Patient reports she is seeing Dr. Dorris Fetch for diabetes management and her last A1C on 07/22/13 was 8.7%.  She states that generally her blood glucose is controlled and most of the time her blood glucose readings are less than 150 mg/dl.  In talking with the patient and inquiring about low blood sugars, patient reports that she awakens from sleep about twice a week with low blood sugars.  Patient upset that she is not receiving her home doses of Lantus and Novolog.  Explained to the patient that our target blood glucose for her as an inpatient is 140-180 mg/dl and she has a different amount of activity and diet as she may be used to as an outpatient.  She states that she does not have much of an appetite lately and she only eats one meal a day (breakfast) and that may be all she eats for the day.  Explained to the patient  that she will also be NPO after midnight for procedure with Dr. Arnoldo Morale in the morning.  Therefore, there is a concern she will be hypoglycemic if we were to give home doses of insulin.  Informed patient I would discuss with Dr. Roderic Palau and that I would relay her concerns related to glycemic control.    Thanks, Barnie Alderman, RN, MSN, CCRN Diabetes Coordinator Inpatient Diabetes Program [phone]

## 2013-08-25 NOTE — Progress Notes (Signed)
[facility] PROGRESS NOTE  SICLALI JUVERA [id number] DOB: Aug 20, 1976 DOA: 08/24/2013 PCP: Odette Fraction, MD  Assessment/Plan: 1. Perirectal abscess. Patient is on IV antibiotics. Appreciate surgery for consult. Plans noted for incision and drainage in morning. 2. Insulin-dependent diabetes. We'll continue the patient on 10 units of NovoLog with meals. Continue sliding scale insulin. Decrease Lantus dose by half since she will be n.p.o. after midnight 3. Status post renal transplant. Continue immunosuppressive therapy 4. Chronic kidney disease stage III. Creatinine is currently at baseline  Code Status: full code Family Communication: discussed with patient Disposition Plan: discharge home once improved   Consultants:  General surgery, Dr. Arnoldo Morale  Procedures:  none  Antibiotics:  unasyn 9/1  HPI/Subjective: Patient is sitting up in bed.  She does admit to having pain in her rectal area.    Objective: Filed Vitals:   08/25/13 1344  BP: 133/63  Pulse: 91  Temp: 98.8 F (37.1 C)  Resp: 19   No intake or output data in the 24 hours ending 08/25/13 1707 Filed Weights   08/24/13 2019  Weight: 118.389 kg (261 lb)    Exam:   General:  NAD  Cardiovascular: S1, S2 RRR  Respiratory: CTA B  Abdomen: soft, obese, nt, bs+  Musculoskeletal: no pedal edema b/l   Data Reviewed: Basic Metabolic Panel:  Recent Labs Lab 08/22/13 2216 08/24/13 2121 08/25/13 0604  NA 128* 139 133*  K 4.4 3.6 3.8  CL 93* 100 98  CO2 25 24 24   GLUCOSE 450* 101* 202*  BUN 19 19 18   CREATININE 1.71* 1.87* 1.65*  CALCIUM 8.9 9.7 8.6   Liver Function Tests:  Recent Labs Lab 08/25/13 0604  AST 8  ALT 7  ALKPHOS 89  BILITOT 0.4  PROT 7.1  ALBUMIN 3.3*   No results found for this basename: LIPASE, AMYLASE,  in the last 168 hours No results found for this basename: AMMONIA,  in the last 168 hours CBC:  Recent Labs Lab 08/22/13 2216 08/24/13 2121  08/25/13 0604  WBC 9.3 15.0* 15.0*  NEUTROABS 7.0 11.8*  --   HGB 11.7* 12.7 11.5*  HCT 36.9 39.3 36.1  MCV 83.9 84.0 83.8  PLT 264 271 258   Cardiac Enzymes: No results found for this basename: CKTOTAL, CKMB, CKMBINDEX, TROPONINI,  in the last 168 hours BNP (last 3 results) No results found for this basename: PROBNP,  in the last 8760 hours CBG:  Recent Labs Lab 08/24/13 2209 08/25/13 0722 08/25/13 1122 08/25/13 1608  GLUCAP 60* 169* 151* 304*    Recent Results (from the past 240 hour(s))  MRSA PCR SCREENING     Status: None   Collection Time    08/25/13  1:13 AM      Result Value Range Status   MRSA by PCR NEGATIVE  NEGATIVE Final   Comment:            The GeneXpert MRSA Assay (FDA     approved for NASAL specimens     only), is one component of a     comprehensive MRSA colonization     surveillance program. It is not     intended to diagnose MRSA     infection nor to guide or     monitor treatment for     MRSA infections.     Studies: No results found.  Scheduled Meds: . ampicillin-sulbactam (UNASYN) IV  3 g Intravenous Q6H  . atorvastatin  20 mg Oral QHS  . busPIRone  15 mg Oral BID  . calcitRIOL  1.75 mcg Oral q morning - 10a  . calcium carbonate  1 tablet Oral TID WC  . chlorhexidine  1 application Topical Once  . cilostazol  50 mg Oral BID  . enoxaparin (LOVENOX) injection  40 mg Subcutaneous Q24H  . FLUoxetine  60 mg Oral q morning - 10a  . hydrOXYzine  50 mg Oral QHS  . insulin aspart  0-9 Units Subcutaneous TID WC  . loratadine  10 mg Oral Daily  . metoCLOPramide  5 mg Oral TID AC  . mycophenolate  540 mg Oral BID  . predniSONE  10 mg Oral Q breakfast  . tacrolimus  3.5 mg Oral BID  . tiotropium  18 mcg Inhalation Daily   Continuous Infusions: . sodium chloride 10 mL/hr at 08/25/13 0102    Active Problems:   Type 2 diabetes mellitus   HTN (hypertension)   Crohn's disease   Perirectal abscess   CKD (chronic kidney disease) stage 3, GFR  30-59 ml/min    Time spent: 40mins    MEMON,JEHANZEB  [facility] Pager [phone]. If 7PM-7AM, please contact night-coverage at [email or web address], password [facility] 08/25/2013, 5:07 PM  LOS: 1 day

## 2013-08-25 NOTE — Telephone Encounter (Signed)
Patient is scheduled for colonoscopy this week but has a painful abscess in or around the anus.  She inquired whether she should proceed with colonoscopy.  She claims that she is unable to walk because of pain secondary to the abscess.  She was seen in the emergency room the day before was told that the abscess.  Because of ongoing pain issues I instructed the patient to go back to the ER for further evaluation and therapy of the abscess and that she should defer colonoscopy.

## 2013-08-25 NOTE — Consult Note (Signed)
Reason for Consult: Perirectal abscess Referring Physician: [facility]  [name] is an 37 y.o. female.  HPI: Patient is a 37 year old black female with multiple medical problems including immunosuppression secondary to a kidney transplant who presents with a several day history of worsening perirectal pain. She does have history Crohn's disease, which apparently has been well controlled. She was admitted last night by the hospitalist for further evaluation treatment. This morning, she has purulent drainage from the perirectal abscess.  Past Medical History  Diagnosis Date  . Chronic anemia   . Panic disorder   . Crohn's disease   . Anxiety     OCD  . 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 Fracture 05/01/2013    Right foot, in cast  . Hypothyroidism     parathroid runs low  . History of blood transfusion 2 years ago  . Complication of anesthesia     hard to sedate dr hung aware  . Cancer 2001    renal cell , breast cancer both breast 2011, cervical cancer 2013  . Renal disorder     Past Surgical History  Procedure Laterality Date  . Nephrectomy transplanted organ  1999 and 2011  . Parathyroidectomy  02-20-2001  . Knee surgery Left april 2003    arthroscopy  . Cystoscopy  02/13/2012    Procedure: CYSTOSCOPY;  Surgeon: Franchot Gallo, MD;  Location: [facility];  Service: Urology;  Laterality: N/A;  insertion of ureteral catheter , removed per Dr Diona Fanti   . Tubal ligation  02-19-2000  . Abdominal hysterectomy  02/13/2012    Procedure: HYSTERECTOMY ABDOMINAL;  Surgeon: Cyril Mourning, MD;  Location: [facility];  Service: Gynecology;  Laterality: N/A;  . Breast lumpectomy and reconstruction Bilateral 2011  . Esophageal manometry N/A 08/10/2013    Procedure: ESOPHAGEAL MANOMETRY (EM);  Surgeon: Beryle Beams, MD;  Location: [facility];  Service: Endoscopy;  Laterality: N/A;    History reviewed. No pertinent family history.  Social History:  reports that she has never  smoked. She has never used smokeless tobacco. She reports that she does not drink alcohol or use illicit drugs.  Allergies:  Allergies  Allergen Reactions  . Barium-Containing Compounds Shortness Of Breath  . Dilaudid [Hydromorphone Hcl] Anaphylaxis  . Hydromorphone Shortness Of Breath  . Ivp Dye [Iodinated Diagnostic Agents] Anaphylaxis  . Shellfish Allergy Shortness Of Breath and Itching    Causes red, patchy areas to form  . Vancomycin Anaphylaxis  . Adhesive [Tape] Other (See Comments)    Dermatitis   . Infed [Iron Dextran] Itching  . Sulfa Antibiotics Hives  . Iopamidol Itching and Rash    Medications: I have reviewed the patient's current medications.  Results for orders placed during the hospital encounter of 08/24/13 (from the past 48 hour(s))  BASIC METABOLIC PANEL     Status: Abnormal   Collection Time    08/24/13  9:21 PM      Result Value Range   Sodium 139  135 - 145 mEq/L   Comment: DELTA CHECK NOTED   Potassium 3.6  3.5 - 5.1 mEq/L   Comment: DELTA CHECK NOTED   Chloride 100  96 - 112 mEq/L   CO2 24  19 - 32 mEq/L   Glucose, Bld 101 (*) 70 - 99 mg/dL   BUN 19  6 - 23 mg/dL   Creatinine, Ser 1.87 (*) 0.50 - 1.10 mg/dL   Calcium 9.7  8.4 - 10.5 mg/dL   GFR calc  non Af Amer 33 (*) >90 mL/min   GFR calc Af Amer 38 (*) >90 mL/min   Comment: (NOTE)     The eGFR has been calculated using the CKD EPI equation.     This calculation has not been validated in all clinical situations.     eGFR's persistently <90 mL/min signify possible Chronic Kidney     Disease.  CBC WITH DIFFERENTIAL     Status: Abnormal   Collection Time    08/24/13  9:21 PM      Result Value Range   WBC 15.0 (*) 4.0 - 10.5 K/uL   RBC 4.68  3.87 - 5.11 MIL/uL   Hemoglobin 12.7  12.0 - 15.0 g/dL   HCT 39.3  36.0 - 46.0 %   MCV 84.0  78.0 - 100.0 fL   MCH 27.1  26.0 - 34.0 pg   MCHC 32.3  30.0 - 36.0 g/dL   RDW 13.8  11.5 - 15.5 %   Platelets 271  150 - 400 K/uL   Neutrophils Relative % 79  (*) 43 - 77 %   Neutro Abs 11.8 (*) 1.7 - 7.7 K/uL   Lymphocytes Relative 13  12 - 46 %   Lymphs Abs 2.0  0.7 - 4.0 K/uL   Monocytes Relative 8  3 - 12 %   Monocytes Absolute 1.1 (*) 0.1 - 1.0 K/uL   Eosinophils Relative 0  0 - 5 %   Eosinophils Absolute 0.1  0.0 - 0.7 K/uL   Basophils Relative 0  0 - 1 %   Basophils Absolute 0.0  0.0 - 0.1 K/uL  GLUCOSE, CAPILLARY     Status: Abnormal   Collection Time    08/24/13 10:09 PM      Result Value Range   Glucose-Capillary 60 (*) 70 - 99 mg/dL  MRSA PCR SCREENING     Status: None   Collection Time    08/25/13  1:13 AM      Result Value Range   MRSA by PCR NEGATIVE  NEGATIVE   Comment:            The GeneXpert MRSA Assay (FDA     approved for NASAL specimens     only), is one component of a     comprehensive MRSA colonization     surveillance program. It is not     intended to diagnose MRSA     infection nor to guide or     monitor treatment for     MRSA infections.  CBC     Status: Abnormal   Collection Time    08/25/13  6:04 AM      Result Value Range   WBC 15.0 (*) 4.0 - 10.5 K/uL   RBC 4.31  3.87 - 5.11 MIL/uL   Hemoglobin 11.5 (*) 12.0 - 15.0 g/dL   HCT 36.1  36.0 - 46.0 %   MCV 83.8  78.0 - 100.0 fL   MCH 26.7  26.0 - 34.0 pg   MCHC 31.9  30.0 - 36.0 g/dL   RDW 13.7  11.5 - 15.5 %   Platelets 258  150 - 400 K/uL  COMPREHENSIVE METABOLIC PANEL     Status: Abnormal   Collection Time    08/25/13  6:04 AM      Result Value Range   Sodium 133 (*) 135 - 145 mEq/L   Potassium 3.8  3.5 - 5.1 mEq/L   Chloride 98  96 - 112 mEq/L  CO2 24  19 - 32 mEq/L   Glucose, Bld 202 (*) 70 - 99 mg/dL   BUN 18  6 - 23 mg/dL   Creatinine, Ser 1.65 (*) 0.50 - 1.10 mg/dL   Calcium 8.6  8.4 - 10.5 mg/dL   Total Protein 7.1  6.0 - 8.3 g/dL   Albumin 3.3 (*) 3.5 - 5.2 g/dL   AST 8  0 - 37 U/L   ALT 7  0 - 35 U/L   Alkaline Phosphatase 89  39 - 117 U/L   Total Bilirubin 0.4  0.3 - 1.2 mg/dL   GFR calc non Af Amer 38 (*) >90 mL/min    GFR calc Af Amer 44 (*) >90 mL/min   Comment: (NOTE)     The eGFR has been calculated using the CKD EPI equation.     This calculation has not been validated in all clinical situations.     eGFR's persistently <90 mL/min signify possible Chronic Kidney     Disease.  GLUCOSE, CAPILLARY     Status: Abnormal   Collection Time    08/25/13  7:22 AM      Result Value Range   Glucose-Capillary 169 (*) 70 - 99 mg/dL    No results found.  ROS: See chart Blood pressure 103/69, pulse 82, temperature 98.8 F (37.1 C), temperature source Oral, resp. rate 20, height 5\' 3"  (1.6 m), weight 118.389 kg (261 lb), last menstrual period 01/30/2012, SpO2 94.00%. Physical Exam: Morbidly obese black female who complains of rectal pain. Rectal examination reveals a draining fluctuant area just posterior to the anus. Examination was limited secondary to pain.  Assessment/Plan: Impression: Perirectal abscess. Recent CT scan the abdomen and pelvis was unremarkable. Plan: Would continue IV antibiotic course. Will take patient to the operating room in the a.m. for incision and drainage of perirectal abscess. The risks and benefits of the procedure were fully explained to the patient, who gave informed consent.  Jaxxon Naeem A 08/25/2013, 9:39 AM

## 2013-08-25 NOTE — Care Management Note (Signed)
    Page 1 of 1   08/27/2013     3:41:29 PM   CARE MANAGEMENT NOTE 08/27/2013  Patient:  [name], [name]   Account Number:  [id number]  Date Initiated:  08/25/2013  Documentation initiated by:  Vladimir Creeks  Subjective/Objective Assessment:   Admitted withperirectal abscess. For I&D tomorrow. On IV ABX, but per pt will go home on PO.Pt is from home, is independent and will return home at D/C     Action/Plan:   No CM needs identified   Anticipated DC Date:  08/27/2013   Anticipated DC Plan:  [place]  CM consult      Choice offered to / List presented to:             Status of service:  Completed, signed off Medicare Important Message given?   (If response is "NO", the following Medicare IM given date fields will be blank) Date Medicare IM given:   Date Additional Medicare IM given:    Discharge Disposition:  HOME/SELF CARE  Per UR Regulation:  Reviewed for med. necessity/level of care/duration of stay  If discussed at [place] of Stay Meetings, dates discussed:    Comments:  08/25/13 [place] RN/CM

## 2013-08-25 NOTE — Progress Notes (Signed)
Dr. Roderic Palau made aware of patient's refusal of insulin injections.  Dr. Roderic Palau states will speak with patient later today.

## 2013-08-25 NOTE — Progress Notes (Signed)
Pt refused Lovenox, states will increase ambulation in room. Dr. Roderic Palau paged and made aware.

## 2013-08-26 ENCOUNTER — Encounter (HOSPITAL_COMMUNITY): Payer: Self-pay | Admitting: Anesthesiology

## 2013-08-26 ENCOUNTER — Encounter (HOSPITAL_COMMUNITY)
Admission: EM | Disposition: A | Discharge status: Home / Self Care | Payer: Self-pay | Source: Home / Self Care | Attending: Internal Medicine

## 2013-08-26 ENCOUNTER — Inpatient Hospital Stay (HOSPITAL_COMMUNITY): Payer: Medicare HMO | Admitting: Anesthesiology

## 2013-08-26 DIAGNOSIS — K612 Anorectal abscess: Secondary | ICD-10-CM | POA: Diagnosis not present

## 2013-08-26 HISTORY — PX: INCISION AND DRAINAGE ABSCESS: SHX5864

## 2013-08-26 LAB — GLUCOSE, CAPILLARY
Glucose-Capillary: 191 mg/dL — ABNORMAL HIGH (ref 70–99)
Glucose-Capillary: 183 mg/dL — ABNORMAL HIGH (ref 70–99)
Glucose-Capillary: 262 mg/dL — ABNORMAL HIGH (ref 70–99)
Glucose-Capillary: 262 mg/dL — ABNORMAL HIGH (ref 70–99)
Glucose-Capillary: 278 mg/dL — ABNORMAL HIGH (ref 70–99)
Glucose-Capillary: 365 mg/dL — ABNORMAL HIGH (ref 70–99)

## 2013-08-26 LAB — CBC
HCT: 34.6 % — ABNORMAL LOW (ref 36.0–46.0)
Hemoglobin: 11.2 g/dL — ABNORMAL LOW (ref 12.0–15.0)
MCH: 27 pg (ref 26.0–34.0)
MCHC: 32.4 g/dL (ref 30.0–36.0)
MCV: 83.4 fL (ref 78.0–100.0)
Platelets: 263 10*3/uL (ref 150–400)
RBC: 4.15 MIL/uL (ref 3.87–5.11)
RDW: 13.7 % (ref 11.5–15.5)
WBC: 10.8 10*3/uL — ABNORMAL HIGH (ref 4.0–10.5)

## 2013-08-26 LAB — BASIC METABOLIC PANEL
BUN: 20 mg/dL (ref 6–23)
CO2: 26 mEq/L (ref 19–32)
Calcium: 9.7 mg/dL (ref 8.4–10.5)
Chloride: 99 mEq/L (ref 96–112)
Creatinine, Ser: 1.71 mg/dL — ABNORMAL HIGH (ref 0.50–1.10)
GFR calc Af Amer: 42 mL/min — ABNORMAL LOW (ref >90)
GFR calc non Af Amer: 37 mL/min — ABNORMAL LOW (ref >90)
Glucose, Bld: 232 mg/dL — ABNORMAL HIGH (ref 70–99)
Potassium: 3.9 mEq/L (ref 3.5–5.1)
Sodium: 136 mEq/L (ref 135–145)

## 2013-08-26 SURGERY — INCISION AND DRAINAGE, ABSCESS
Anesthesia: General | Wound class: Dirty or Infected

## 2013-08-26 MED ORDER — MIDAZOLAM HCL 2 MG/2ML IJ SOLN
INTRAMUSCULAR | Status: AC
  Filled 2013-08-26: qty 2

## 2013-08-26 MED ORDER — FENTANYL CITRATE 0.05 MG/ML IJ SOLN
INTRAMUSCULAR | Status: DC | PRN
  Administered 2013-08-26: 50 ug via INTRAVENOUS
  Administered 2013-08-26 (×2): 25 ug via INTRAVENOUS

## 2013-08-26 MED ORDER — GLYCOPYRROLATE 0.2 MG/ML IJ SOLN
INTRAMUSCULAR | Status: DC | PRN
  Administered 2013-08-26: 0.4 mg via INTRAVENOUS
  Administered 2013-08-26: 0.2 mg via INTRAVENOUS

## 2013-08-26 MED ORDER — ROCURONIUM BROMIDE 100 MG/10ML IV SOLN
INTRAVENOUS | Status: DC | PRN
  Administered 2013-08-26: 15 mg via INTRAVENOUS
  Administered 2013-08-26: 5 mg via INTRAVENOUS

## 2013-08-26 MED ORDER — FENTANYL CITRATE 0.05 MG/ML IJ SOLN
25.0000 ug | INTRAMUSCULAR | Status: DC | PRN
  Administered 2013-08-26 (×2): 50 ug via INTRAVENOUS

## 2013-08-26 MED ORDER — FENTANYL CITRATE 0.05 MG/ML IJ SOLN
25.0000 ug | INTRAMUSCULAR | Status: AC
  Administered 2013-08-26 (×2): 25 ug via INTRAVENOUS

## 2013-08-26 MED ORDER — BUPIVACAINE HCL (PF) 0.5 % IJ SOLN
INTRAMUSCULAR | Status: AC
  Filled 2013-08-26: qty 30

## 2013-08-26 MED ORDER — NEOSTIGMINE METHYLSULFATE 1 MG/ML IJ SOLN
INTRAMUSCULAR | Status: DC | PRN
  Administered 2013-08-26: 2 mg via INTRAVENOUS

## 2013-08-26 MED ORDER — ROCURONIUM BROMIDE 50 MG/5ML IV SOLN
INTRAVENOUS | Status: AC
  Filled 2013-08-26: qty 1

## 2013-08-26 MED ORDER — LIDOCAINE HCL (CARDIAC) 10 MG/ML IV SOLN
INTRAVENOUS | Status: DC | PRN
  Administered 2013-08-26: 20 mg via INTRAVENOUS

## 2013-08-26 MED ORDER — ONDANSETRON HCL 4 MG/2ML IJ SOLN
INTRAMUSCULAR | Status: AC
  Filled 2013-08-26: qty 2

## 2013-08-26 MED ORDER — FENTANYL CITRATE 0.05 MG/ML IJ SOLN
INTRAMUSCULAR | Status: AC
  Filled 2013-08-26: qty 2

## 2013-08-26 MED ORDER — METHYLPREDNISOLONE SODIUM SUCC 125 MG IJ SOLR
INTRAMUSCULAR | Status: AC
  Filled 2013-08-26: qty 2

## 2013-08-26 MED ORDER — SUCCINYLCHOLINE CHLORIDE 20 MG/ML IJ SOLN
INTRAMUSCULAR | Status: DC | PRN
  Administered 2013-08-26: 100 mg via INTRAVENOUS

## 2013-08-26 MED ORDER — GLYCOPYRROLATE 0.2 MG/ML IJ SOLN
INTRAMUSCULAR | Status: AC
  Filled 2013-08-26: qty 2

## 2013-08-26 MED ORDER — GLYCOPYRROLATE 0.2 MG/ML IJ SOLN
INTRAMUSCULAR | Status: AC
  Filled 2013-08-26: qty 1

## 2013-08-26 MED ORDER — BUPIVACAINE HCL (PF) 0.5 % IJ SOLN
INTRAMUSCULAR | Status: DC | PRN
  Administered 2013-08-26: 8 mL

## 2013-08-26 MED ORDER — MIDAZOLAM HCL 2 MG/2ML IJ SOLN
1.0000 mg | INTRAMUSCULAR | Status: DC | PRN
  Administered 2013-08-26: 2 mg via INTRAVENOUS

## 2013-08-26 MED ORDER — SODIUM CHLORIDE 0.9 % IR SOLN
Status: DC | PRN
  Administered 2013-08-26: 1000 mL

## 2013-08-26 MED ORDER — SUCCINYLCHOLINE CHLORIDE 20 MG/ML IJ SOLN
INTRAMUSCULAR | Status: AC
  Filled 2013-08-26: qty 1

## 2013-08-26 MED ORDER — ONDANSETRON HCL 4 MG/2ML IJ SOLN: 4.0000 mg | Freq: Once | INTRAMUSCULAR | Status: DC | PRN

## 2013-08-26 MED ORDER — INSULIN GLARGINE 100 UNIT/ML ~~LOC~~ SOLN
45.0000 [IU] | Freq: Every day | SUBCUTANEOUS | Status: DC
  Administered 2013-08-26: 45 [IU] via SUBCUTANEOUS
  Filled 2013-08-26: qty 0.45

## 2013-08-26 MED ORDER — LACTATED RINGERS IV SOLN
INTRAVENOUS | Status: DC
  Administered 2013-08-26: 08:00:00 via INTRAVENOUS

## 2013-08-26 MED ORDER — PROPOFOL 10 MG/ML IV EMUL
INTRAVENOUS | Status: AC
  Filled 2013-08-26: qty 20

## 2013-08-26 MED ORDER — ONDANSETRON HCL 4 MG/2ML IJ SOLN
4.0000 mg | Freq: Once | INTRAMUSCULAR | Status: AC
  Administered 2013-08-26: 4 mg via INTRAVENOUS

## 2013-08-26 MED ORDER — FENTANYL CITRATE 0.05 MG/ML IJ SOLN
INTRAMUSCULAR | Status: AC
Start: 2013-08-26 — End: 2013-08-26
  Filled 2013-08-26: qty 2

## 2013-08-26 MED ORDER — PROPOFOL 10 MG/ML IV BOLUS
INTRAVENOUS | Status: DC | PRN
  Administered 2013-08-26: 150 mg via INTRAVENOUS

## 2013-08-26 MED ORDER — METHYLPREDNISOLONE SODIUM SUCC 125 MG IJ SOLR
60.0000 mg | Freq: Once | INTRAMUSCULAR | Status: AC
  Administered 2013-08-26: 60 mg via INTRAVENOUS

## 2013-08-26 SURGICAL SUPPLY — 31 items
BAG HAMPER (MISCELLANEOUS) ×2 IMPLANT
BANDAGE CONFORM 2  STR LF (GAUZE/BANDAGES/DRESSINGS) ×2 IMPLANT
CLOTH BEACON ORANGE TIMEOUT ST (SAFETY) ×2 IMPLANT
COVER LIGHT HANDLE STERIS (MISCELLANEOUS) ×4 IMPLANT
DECANTER SPIKE VIAL GLASS SM (MISCELLANEOUS) ×1 IMPLANT
ELECT REM PT RETURN 9FT ADLT (ELECTROSURGICAL) ×2
ELECTRODE REM PT RTRN 9FT ADLT (ELECTROSURGICAL) ×1 IMPLANT
GAUZE PACKING IODOFORM 2 (PACKING) ×1 IMPLANT
GLOVE BIOGEL M STRL SZ7.5 (GLOVE) ×2 IMPLANT
GLOVE BIOGEL PI IND STRL 7.0 (GLOVE) IMPLANT
GLOVE BIOGEL PI INDICATOR 7.0 (GLOVE) ×1
GLOVE EXAM NITRILE LRG STRL (GLOVE) ×1 IMPLANT
GLOVE SS BIOGEL STRL SZ 6.5 (GLOVE) IMPLANT
GLOVE SUPERSENSE BIOGEL SZ 6.5 (GLOVE) ×1
GOWN STRL REIN XL XLG (GOWN DISPOSABLE) ×4 IMPLANT
KIT ROOM TURNOVER APOR (KITS) ×2 IMPLANT
MANIFOLD NEPTUNE II (INSTRUMENTS) ×2 IMPLANT
MARKER SKIN DUAL TIP RULER LAB (MISCELLANEOUS) ×2 IMPLANT
NDL HYPO 25X1 1.5 SAFETY (NEEDLE) IMPLANT
NEEDLE HYPO 25X1 1.5 SAFETY (NEEDLE) ×2 IMPLANT
NS IRRIG 1000ML POUR BTL (IV SOLUTION) ×2 IMPLANT
PACK BASIC LIMB (CUSTOM PROCEDURE TRAY) IMPLANT
PACK MINOR (CUSTOM PROCEDURE TRAY) ×2 IMPLANT
PAD ABD 5X9 TENDERSORB (GAUZE/BANDAGES/DRESSINGS) IMPLANT
PAD ARMBOARD 7.5X6 YLW CONV (MISCELLANEOUS) ×2 IMPLANT
SET BASIN LINEN APH (SET/KITS/TRAYS/PACK) ×2 IMPLANT
SPONGE GAUZE 4X4 12PLY (GAUZE/BANDAGES/DRESSINGS) IMPLANT
SWAB CULTURE LIQ STUART DBL (MISCELLANEOUS) ×1 IMPLANT
SYR BULB IRRIGATION 50ML (SYRINGE) ×2 IMPLANT
SYR CONTROL 10ML LL (SYRINGE) ×1 IMPLANT
TUBE ANAEROBIC PORT A CUL  W/M (MISCELLANEOUS) ×1 IMPLANT

## 2013-08-26 NOTE — Progress Notes (Signed)
Contacted surgeon to let him know that patient does not have an i.v. At this time. No orders received at this time.

## 2013-08-26 NOTE — Progress Notes (Signed)
I.V. Access lost. Attempts to restart unsuccessful. Contacted hospitalist on call to make him aware because the patient is scheduled for surgery this am and she is also getting i.v. antibiotics. No further orders at this time.

## 2013-08-26 NOTE — Anesthesia Procedure Notes (Signed)
Procedure Name: Intubation Date/Time: 08/26/2013 9:13 AM Performed by: Vista Deck Pre-anesthesia Checklist: Patient identified, Patient being monitored, Timeout performed, Emergency Drugs available and Suction available Patient Re-evaluated:Patient Re-evaluated prior to inductionOxygen Delivery Method: Circle System Utilized Preoxygenation: Pre-oxygenation with 100% oxygen Intubation Type: IV induction, Rapid sequence and Cricoid Pressure applied Ventilation: Mask ventilation without difficulty Laryngoscope Size: Miller and 2 Grade View: Grade I Tube type: Oral Tube size: 7.0 mm Number of attempts: 1 Airway Equipment and Method: stylet Placement Confirmation: ETT inserted through vocal cords under direct vision,  positive ETCO2 and breath sounds checked- equal and bilateral Secured at: 21 cm Tube secured with: Tape Dental Injury: Teeth and Oropharynx as per pre-operative assessment

## 2013-08-26 NOTE — Anesthesia Postprocedure Evaluation (Signed)
Anesthesia Post Note  Patient: [name]  Procedure(s) Performed: Procedure(s) (LRB): INCISION AND DRAINAGE PERIRECTAL ABSCESS (N/A)  Anesthesia type: General  Patient location: PACU  Post pain: Pain level controlled  Post assessment: Post-op Vital signs reviewed, Patient's Cardiovascular Status Stable, Respiratory Function Stable, Patent Airway, No signs of Nausea or vomiting and Pain level controlled  Last Vitals:  Filed Vitals:   08/26/13 0958  BP: 124/74  Pulse: 77  Temp: 36.7 C  Resp: 12    Post vital signs: Reviewed and stable  Level of consciousness: awake and alert   Complications: No apparent anesthesia complications

## 2013-08-26 NOTE — Op Note (Signed)
Patient:  [name]  DOB:  06/04/1976  MRN:  [id number]   Preop Diagnosis:  Perirectal abscess  Postop Diagnosis:  Same  Procedure:  Incision and drainage of deep retrorectal abscess  Surgeon:  Aviva Signs, M.D.  Anes:  General  Indications:  Patient is a morbidly obese 37 year old black female with multiple medical problems including immunosuppression secondary to a kidney transplant and Crohn's disease who presents with a perirectal abscess. The risks and benefits of the procedure were fully explained to the patient, who gave informed consent.  Procedure note:  The patient was placed the supine position. After general anesthesia was administered, the patient was placed in the lithotomy position. The perineum was prepped and draped using usual sterile technique with Betadine. Surgical site confirmation was performed.  The patient had a draining large retro-rectal cavity present in the posterior midline position just outside the anal verge. The sphincter mechanism was not involved. There was no fistulous tract into the rectum. Aerobic and anaerobic cultures were taken and sent to microbiology. The wound was irrigated normal saline. A bleeding was controlled using Bovie electrocautery. The cavity was then packed with 2 inch iodoform new gauze. 0.5% Sensorcaine was instilled the surrounding peritoneum. A dry sterile dressing was applied.  All tape and needle counts were correct at the end of the procedure. The patient was awakened in the operating room and transferred to PACU in stable condition.  Complications:  None  EBL:  10 cc  Specimen:  Aerobic and anaerobic cultures of perirectal abscess

## 2013-08-26 NOTE — Transfer of Care (Signed)
Immediate Anesthesia Transfer of Care Note  Patient: [name]  Procedure(s) Performed: Procedure(s) (LRB): INCISION AND DRAINAGE PERIRECTAL ABSCESS (N/A)  Patient Location: PACU  Anesthesia Type: General  Level of Consciousness: awake  Airway & Oxygen Therapy: Patient Spontanous Breathing and non-rebreather face mask  Post-op Assessment: Report given to PACU RN, Post -op Vital signs reviewed and stable and Patient moving all extremities  Post vital signs: Reviewed and stable  Complications: No apparent anesthesia complications

## 2013-08-26 NOTE — Anesthesia Preprocedure Evaluation (Signed)
Anesthesia Evaluation  Patient identified by MRN, date of birth, ID band Patient awake    Reviewed: Allergy & Precautions, H&P , Patient's Chart, lab work & pertinent test results  History of Anesthesia Complications (+) PONV  Airway Mallampati: II TM Distance: >3 FB Neck ROM: full    Dental  (+) Teeth Intact   Pulmonary asthma (Mostly seasonal; spring, breating wel, clear lungs, takes steroids for imunosuppression, not Asthma) , sleep apnea ,  breath sounds clear to auscultation        Cardiovascular hypertension, Pt. on medications Rhythm:regular Rate:Normal     Neuro/Psych PSYCHIATRIC DISORDERS Anxiety Depression    GI/Hepatic   Endo/Other  diabetes, Type 2, Oral Hypoglycemic AgentsHypothyroidism Morbid obesityWell controlled, am bs 121,   Renal/GU Renal disease     Musculoskeletal   Abdominal   Peds  Hematology   Anesthesia Other Findings       Reproductive/Obstetrics (+) Pregnancy                           Anesthesia Physical Anesthesia Plan  ASA: III  Anesthesia Plan: General   Post-op Pain Management:    Induction: Intravenous, Rapid sequence and Cricoid pressure planned  Airway Management Planned: Oral ETT  Additional Equipment:   Intra-op Plan:   Post-operative Plan: Extubation in OR  Informed Consent: I have reviewed the patients History and Physical, chart, labs and discussed the procedure including the risks, benefits and alternatives for the proposed anesthesia with the patient or authorized representative who has indicated his/her understanding and acceptance.     Plan Discussed with:   Anesthesia Plan Comments:         Anesthesia Quick Evaluation

## 2013-08-26 NOTE — Progress Notes (Signed)
I.v. Access lost. Patient stuck a total of 6 times by different nurses.

## 2013-08-26 NOTE — Progress Notes (Signed)
[facility] PROGRESS NOTE  [name] [id number] DOB: [birth year] DOA: 08/24/2013 PCP: Odette Fraction, MD  Assessment/Plan: 1. Perirectal abscess: Afebrile, leukocytosis trending downwards. Status post incision and drainage 9/3. Continue empiric antibiotics, plan transition to oral likely in the next 24 hours. 2. Diabetes mellitus with gastroparesis: Patient refusing insulin injections at times. Home regimen: Lantus 45 units each bedtime, NovoLog 15 units with each meal. last A1C on 07/22/13 was 8.7% 3. History of end-stage renal disease status post renal transplant, chronic kidney disease stage III: Stable. Continue chronic immunosuppressants. 4. Anemia of chronic disease: Stable. 5. History of Crohn's disease, hypothyroidism, renal cell cancer, bilateral breast cancer, cervical cancer per chart   Continue antibiotics. Followup wound culture.  Increase Lantus to home regimen dose.  Pending studies:   Wound culture   Code Status: Full code  DVT prophylaxis: Lovenox Family Communication:  Disposition Plan: Home when improved, likely less than 48 hours  Murray Hodgkins, MD  [facility]  Pager [phone] If 7PM-7AM, please contact night-coverage at [email or web address], password [facility] 08/26/2013, 12:10 PM  LOS: 2 days   Summary: 37 year old woman presented to the emergency department with rectal pain and fever.She was admitted for perirectal abscess and surgical consultation.   Consultants:  General surgery   Procedures:  9/3 incision and drainage deep retrorectal abscess   Antibiotics:  Unasyn 9/1 >>   HPI/Subjective: Overall doing okay today. Some postoperative pain. Eating popcorn without difficulty.  Objective: Filed Vitals:   08/26/13 1030 08/26/13 1045 08/26/13 1100 08/26/13 1131  BP: 103/51 102/71  101/45  Pulse: 81 81 77 85  Temp:      TempSrc:      Resp: 15 14 15 20   Height:      Weight:      SpO2: 94% 95% 93% 98%    Intake/Output  Summary (Last 24 hours) at 08/26/13 1210 Last data filed at 08/26/13 1000  Gross per 24 hour  Intake   1300 ml  Output      3 ml  Net   1297 ml     Filed Weights   08/24/13 2019  Weight: 118.389 kg (261 lb)    Exam:   afebrile, vital signs stable.  General: Appears calm and comfortable. Nontoxic.  Psychiatric: Grossly normal mood and affect. Speech fluent and appropriate.  Cardiovascular: Regular rate and rhythm. No murmur, rub, gallop.  Respiratory: Clear to auscultation bilaterally. No wheezes, rales, rhonchi. Normal respiratory effort.  Data Reviewed:  Capillary blood sugars labile   Basic metabolic panel appears stable  White blood cell count now nearly normal   Scheduled Meds: . ampicillin-sulbactam (UNASYN) IV  3 g Intravenous Q6H  . atorvastatin  20 mg Oral QHS  . busPIRone  15 mg Oral BID  . calcitRIOL  1.75 mcg Oral q morning - 10a  . calcium carbonate  1 tablet Oral TID WC  . cilostazol  50 mg Oral BID  . enoxaparin (LOVENOX) injection  40 mg Subcutaneous Q24H  . FLUoxetine  60 mg Oral q morning - 10a  . hydrOXYzine  50 mg Oral QHS  . insulin aspart  0-9 Units Subcutaneous TID WC  . insulin aspart  10 Units Subcutaneous TID WC  . insulin glargine  20 Units Subcutaneous QHS  . loratadine  10 mg Oral Daily  . metoCLOPramide  5 mg Oral TID AC  . mycophenolate  540 mg Oral BID  . predniSONE  10 mg Oral Q breakfast  . tacrolimus  3.5 mg Oral  BID  . tiotropium  18 mcg Inhalation Daily   Continuous Infusions: . lactated ringers 75 mL/hr at 08/26/13 0825    Principal Problem:   Perirectal abscess Active Problems:   Type 2 diabetes mellitus   HTN (hypertension)   Crohn's disease   CKD (chronic kidney disease) stage 3, GFR 30-59 ml/min   Time spent 20 minutes

## 2013-08-27 ENCOUNTER — Encounter (HOSPITAL_COMMUNITY): Payer: Self-pay | Admitting: General Surgery

## 2013-08-27 LAB — GLUCOSE, CAPILLARY: Glucose-Capillary: 204 mg/dL — ABNORMAL HIGH (ref 70–99)

## 2013-08-27 MED ORDER — FAMOTIDINE 20 MG PO TABS
20.0000 mg | ORAL_TABLET | Freq: Two times a day (BID) | ORAL | Status: DC
  Administered 2013-08-27: 20 mg via ORAL
  Filled 2013-08-27: qty 1

## 2013-08-27 MED ORDER — INSULIN ASPART 100 UNIT/ML ~~LOC~~ SOLN: 15.0000 [IU] | Freq: Three times a day (TID) | SUBCUTANEOUS | Status: DC

## 2013-08-27 MED ORDER — ALUM & MAG HYDROXIDE-SIMETH 200-200-20 MG/5ML PO SUSP
30.0000 mL | Freq: Four times a day (QID) | ORAL | Status: DC | PRN
  Administered 2013-08-27: 30 mL via ORAL
  Filled 2013-08-27: qty 30

## 2013-08-27 MED ORDER — AMOXICILLIN-POT CLAVULANATE 875-125 MG PO TABS: 1.0000 | ORAL_TABLET | Freq: Two times a day (BID) | ORAL | Status: DC

## 2013-08-27 NOTE — Progress Notes (Signed)
1 Day Post-Op  Subjective: States rectal pain much improved.  Objective: Vital signs in last 24 hours: Temp:  [97.7 F (36.5 C)-98.5 F (36.9 C)] 98.1 F (36.7 C) (09/04 0649) Pulse Rate:  [72-85] 78 (09/04 0649) Resp:  [9-20] 18 (09/04 0649) BP: (101-137)/(45-83) 107/51 mmHg (09/04 0649) SpO2:  [92 %-100 %] 97 % (09/04 0724) Last BM Date: 08/25/13  Intake/Output from previous day: 09/03 0701 - 09/04 0700 In: 1240 [P.O.:240; I.V.:1000] Out: 4 [Urine:4] Intake/Output this shift:    General appearance: alert, cooperative and no distress GI: Rectal packing removed. The wound healing well.  Lab Results:   Recent Labs  08/25/13 0604 08/26/13 0606  WBC 15.0* 10.8*  HGB 11.5* 11.2*  HCT 36.1 34.6*  PLT 258 263   BMET  Recent Labs  08/25/13 0604 08/26/13 0606  NA 133* 136  K 3.8 3.9  CL 98 99  CO2 24 26  GLUCOSE 202* 232*  BUN 18 20  CREATININE 1.65* 1.71*  CALCIUM 8.6 9.7   PT/INR No results found for this basename: LABPROT, INR,  in the last 72 hours  Studies/Results: No results found.  Anti-infectives: Anti-infectives   Start     Dose/Rate Route Frequency Ordered Stop   08/27/13 1000  amoxicillin-clavulanate (AUGMENTIN) 875-125 MG per tablet 1 tablet     1 tablet Oral Every 12 hours 08/27/13 0821     08/24/13 2100  Ampicillin-Sulbactam (UNASYN) 3 g in sodium chloride 0.9 % 100 mL IVPB  Status:  Discontinued     3 g 100 mL/hr over 60 Minutes Intravenous Every 6 hours 08/24/13 2046 08/27/13 [id number]      Assessment/Plan: s/p Procedure(s): INCISION AND DRAINAGE PERIRECTAL ABSCESS Impression: Doing well postoperative day one. Preliminary Gram stain reveals mixed bacteria. Plan: Patient may be discharged from my standpoint. I will see patient in office in one week. She should do sitz baths twice a day. Agree with Augmentin.  LOS: 3 days    Mandy Peeks A 08/27/2013

## 2013-08-27 NOTE — Discharge Summary (Signed)
Physician Discharge Summary  [name] [id number] DOB: 1976-01-20 DOA: 08/24/2013  PCP: Odette Fraction, MD  Admit date: 08/24/2013 Discharge date: 08/27/2013  Time spent: 45  minutes  Recommendations for Outpatient Follow-up:   PCP for DM and Chronic Kidney Disease management.  Follow up with Dr. Arnoldo Morale in one week for post op follow up and final wound culture results.  Discharge Diagnoses:  Principal Problem:   Perirectal abscess Active Problems:   Type 2 diabetes mellitus   HTN (hypertension)   Crohn's disease   CKD (chronic kidney disease) stage 3, GFR 30-59 ml/min   Discharge Condition: stable  Diet recommendation: carb modified  Filed Weights   08/24/13 2019  Weight: 118.389 kg (261 lb)    History of present illness:  37 year old female with a history of chronic end-stage renal disease status post renal transplant, diabetes mellitus, hypertension, crohns disease, gastroparesis who came to the ED with rectal pain and fever.  Patient presented today with progressively worsening rectal pain, fever and chills. She rates the pain as 10/12 in intensity. She denies any nausea vomiting or diarrhea. She has not moved her bowels since Saturday. She denies chest pain shortness of breath.  Hospital Course:   Perirectal abscess in the setting of Crohn's disease:  In a diabetic patient with crohn's disease and chronic immunosuppression for renal transplant General Surgery consulted.  Patient underwent I&D with Dr. Arnoldo Morale on 9/3 Final Culture results still pending Treated with Unasyn as an inpatient.  Will be discharged with 7 days of Augmentin. Patient instructed regarding wound care.  Follow up with Dr. Arnoldo Morale in 1 week. Follow up scheduled with Dr. Benson Norway of Gastroenterology.  Diabetes mellitus with gastroparesis: Patient refusing insulin injections at times.  Continue Home regimen: Lantus 45 units each bedtime, NovoLog 15 units with each meal. A1C on 07/22/13 was  8.7%  Patient understands how to eat to help prevent a gastroparesis flare.  History of end-stage renal disease status post renal transplant, chronic kidney disease stage III:  Stable. Continue chronic immunosuppressants.   Anemia of chronic disease:  Stable.   History of hypothyroidism, renal cell cancer, bilateral breast cancer, cervical cancer per chart   Procedures:  I&D 9/3  Consultations:  General Surgery  Discharge Exam: Filed Vitals:   08/27/13 0649  BP: 107/51  Pulse: 78  Temp: 98.1 F (36.7 C)  Resp: 18   General:  WD, Overweight, Pleasant, AA female. Sitting on bed in NAD CV: RRR, no m/r/g, no lower ext edema Resp:  CTA, no w/c/r, no accessory muscle use Abdomen:  Soft, nt, nd, +BS, no appreciable masses Extremities:  5/5 strength in each Peri-rectal:  Not examined.  As Dr. Arnoldo Morale just examined the [facility].   Discharge Instructions      Discharge Orders   Future Orders Complete By Expires   Diet - low sodium heart healthy  As directed    Increase activity slowly  As directed        Medication List         amoxicillin-clavulanate 875-125 MG per tablet  Commonly known as:  AUGMENTIN  Take 1 tablet by mouth every 12 (twelve) hours.     atorvastatin 20 MG tablet  Commonly known as:  LIPITOR  Take 20 mg by mouth at bedtime.     busPIRone 15 MG tablet  Commonly known as:  BUSPAR  Take 15 mg by mouth 2 (two) times daily.     calcitRIOL 0.25 MCG capsule  Commonly known as:  ROCALTROL  Take 1.75 mcg by mouth every morning.     calcium carbonate 1250 MG tablet  Commonly known as:  OS-CAL - dosed in mg of elemental calcium  Take 1 tablet by mouth 3 (three) times daily.     cetirizine 10 MG tablet  Commonly known as:  ZYRTEC  Take 10 mg by mouth every evening.     cilostazol 50 MG tablet  Commonly known as:  PLETAL  Take 50 mg by mouth 2 (two) times daily.     FLUoxetine 20 MG capsule  Commonly known as:  PROZAC  Take 60 mg by mouth  every morning.     hydrOXYzine 25 MG tablet  Commonly known as:  ATARAX/VISTARIL  Take 50 mg by mouth at bedtime.     insulin aspart 100 UNIT/ML injection  Commonly known as:  novoLOG  Inject 15 Units into the skin 3 (three) times daily before meals.     insulin glargine 100 UNIT/ML injection  Commonly known as:  LANTUS  Inject 45 Units into the skin at bedtime.     LINZESS 145 MCG Caps capsule  Generic drug:  Linaclotide  Take 145 mcg by mouth daily as needed (pain).     metoCLOPramide 5 MG tablet  Commonly known as:  REGLAN  Take 5 mg by mouth 3 (three) times daily. Frequency unspecified     mycophenolate 180 MG EC tablet  Commonly known as:  MYFORTIC  Take 540 mg by mouth 2 (two) times daily.     omeprazole 40 MG capsule  Commonly known as:  PRILOSEC  Take 40 mg by mouth 2 (two) times daily.     Oxycodone HCl 10 MG Tabs  Take 10 mg by mouth 3 (three) times daily as needed. For pain     oxyCODONE-acetaminophen 5-325 MG per tablet  Commonly known as:  PERCOCET/ROXICET  Take 1 tablet by mouth every 4 (four) hours as needed for pain.     predniSONE 10 MG tablet  Commonly known as:  DELTASONE  Take 10 mg by mouth daily.     promethazine 25 MG tablet  Commonly known as:  PHENERGAN  Take 25-50 mg by mouth every 6 (six) hours as needed. For nausea     tacrolimus 0.5 MG capsule  Commonly known as:  PROGRAF  Take 3.5 mg by mouth 2 (two) times daily. Take with 1mg  capsules for total of 3.5mg  total dose twice daily     tacrolimus 1 MG capsule  Commonly known as:  PROGRAF  Take 3.5 mg by mouth 2 (two) times daily. Pt takes a total of 3.5 mg     tiotropium 18 MCG inhalation capsule  Commonly known as:  SPIRIVA  Place 18 mcg into inhaler and inhale daily.       Allergies  Allergen Reactions  . Barium-Containing Compounds Shortness Of Breath  . Dilaudid [Hydromorphone Hcl] Anaphylaxis  . Hydromorphone Shortness Of Breath  . Ivp Dye [Iodinated Diagnostic Agents]  Anaphylaxis  . Shellfish Allergy Shortness Of Breath and Itching    Causes red, patchy areas to form  . Vancomycin Anaphylaxis  . Adhesive [Tape] Other (See Comments)    Dermatitis   . Infed [Iron Dextran] Itching  . Sulfa Antibiotics Hives  . Iopamidol Itching and Rash   Follow-up Information   Follow up with Jamesetta So, MD. Schedule an appointment as soon as possible for a visit on 09/03/2013.   Specialty:  General Surgery   Contact information:  [address] [place] [id number] [phone]       Follow up with [facility] TOM, MD. Schedule an appointment as soon as possible for a visit in 2 weeks.   Specialty:  Family Medicine   Contact information:   [address] [place] [address] [phone]        The results of significant diagnostics from this hospitalization (including imaging, microbiology, ancillary and laboratory) are listed below for reference.    Significant Diagnostic Studies: Ct Abdomen Pelvis Wo Contrast  08/22/2013   CLINICAL DATA:  Crohn's disease. History of renal cell cancer with nephrectomy and transplant.  EXAM: CT ABDOMEN AND PELVIS WITHOUT CONTRAST  TECHNIQUE: Multidetector CT imaging of the abdomen and pelvis was performed following the standard protocol without intravenous contrast.  COMPARISON:  11/08/2010  FINDINGS: Lung bases are clear. No effusions. Heart is normal size. Dense coronary artery calcifications, advanced for patient's age. Small pleural effusion.  Severely atrophic left kidney compatible with end-stage renal disease. Prior right nephrectomy. Liver, spleen, pancreas and adrenals have an unremarkable unenhanced appearance.  Atrophic right lower quadrant renal transplant with larger left lower quadrant renal transplant. No hydronephrosis.  Stomach, large and small bowel are decompressed and grossly unremarkable. Extensive aortic and branch vessels calcifications. Prior hysterectomy. No adnexal masses. Urinary  bladder is unremarkable.  No acute bony abnormality.  IMPRESSION: No acute findings in the abdomen or pelvis.  Numerous chronic findings as above.   Electronically Signed   By: Rolm Baptise   On: 08/22/2013 23:37   Nm Gastric Emptying  08/07/2013   CLINICAL DATA:  Vomiting with reflux. History of diabetes, hiatal hernia and Crohn's disease.  EXAM: NUCLEAR MEDICINE GASTRIC EMPTYING SCAN  TECHNIQUE: After oral ingestion of radiolabeled meal, sequential abdominal images were obtained for 120 minutes. Residual percentage of activity remaining within the stomach was calculated at 60 and 120 minutes.  COMPARISON:  Abdominal pelvic CT 11/08/2010  RADIOPHARMACEUTICALS:  2.1 mCi technetium 39m sulfur colloid cooked in one egg with toast and water  FINDINGS: Planar imaging demonstrates delayed gastric emptying. The calculated retention within the stomach is 81% at 1 hr and 40% at 2 hours. Normal retention at 2 hours is less than 30%.  IMPRESSION: Mildly delayed solid gastric emptying.   Electronically Signed   By: Camie Patience   On: 08/07/2013 09:58    Microbiology: Recent Results (from the past 240 hour(s))  MRSA PCR SCREENING     Status: None   Collection Time    08/25/13  1:13 AM      Result Value Range Status   MRSA by PCR NEGATIVE  NEGATIVE Final   Comment:            The GeneXpert MRSA Assay (FDA     approved for NASAL specimens     only), is one component of a     comprehensive MRSA colonization     surveillance program. It is not     intended to diagnose MRSA     infection nor to guide or     monitor treatment for     MRSA infections.  SURGICAL PCR SCREEN     Status: None   Collection Time    08/25/13  3:32 PM      Result Value Range Status   MRSA, PCR NEGATIVE  NEGATIVE Final   Staphylococcus aureus NEGATIVE  NEGATIVE Final   Comment:            The Xpert  SA Assay (FDA     approved for NASAL specimens     in patients over 55 years of age),     is one component of     a comprehensive  surveillance     program.  Test performance has     been validated by [employer] for patients greater     than or equal to 18 year old.     It is not intended     to diagnose infection nor to     guide or monitor treatment.  CULTURE, ROUTINE-ABSCESS     Status: None   Collection Time    08/26/13  9:27 AM      Result Value Range Status   Specimen Description ABSCESS PERIRECTAL   Final   Special Requests UNASYN 3gm  IMMUNE:COMPROMISED   Final   Gram Stain     Final   Value: MODERATE WBC PRESENT,BOTH PMN AND MONONUCLEAR     NO SQUAMOUS EPITHELIAL CELLS SEEN     FEW GRAM POSITIVE COCCI     IN PAIRS RARE GRAM POSITIVE RODS     Performed at [facility]   Culture     Final   Value: Culture reincubated for better growth     Performed at [facility]   Report Status PENDING   Incomplete  ANAEROBIC CULTURE     Status: None   Collection Time    08/26/13  9:27 AM      Result Value Range Status   Specimen Description ABSCESS PERIRECTAL   Final   Special Requests UNASYN 3gm  IMMUNE:COMPROMISED   Final   Gram Stain     Final   Value: MODERATE WBC PRESENT,BOTH PMN AND MONONUCLEAR     NO SQUAMOUS EPITHELIAL CELLS SEEN     FEW GRAM POSITIVE COCCI     IN PAIRS RARE GRAM POSITIVE RODS     Performed at [facility]   Culture     Final   Value: NO ANAEROBES ISOLATED; CULTURE IN PROGRESS FOR 5 DAYS     Performed at [facility]   Report Status PENDING   Incomplete     Labs: Basic Metabolic Panel:  Recent Labs Lab 08/22/13 2216 08/24/13 2121 08/25/13 0604 08/26/13 0606  NA 128* 139 133* 136  K 4.4 3.6 3.8 3.9  CL 93* 100 98 99  CO2 25 24 24 26   GLUCOSE 450* 101* 202* 232*  BUN 19 19 18 20   CREATININE 1.71* 1.87* 1.65* 1.71*  CALCIUM 8.9 9.7 8.6 9.7   Liver Function Tests:  Recent Labs Lab 08/25/13 0604  AST 8  ALT 7  ALKPHOS 89  BILITOT 0.4  PROT 7.1  ALBUMIN 3.3*   CBC:  Recent Labs Lab 08/22/13 2216 08/24/13 2121  08/25/13 0604 08/26/13 0606  WBC 9.3 15.0* 15.0* 10.8*  NEUTROABS 7.0 11.8*  --   --   HGB 11.7* 12.7 11.5* 11.2*  HCT 36.9 39.3 36.1 34.6*  MCV 83.9 84.0 83.8 83.4  PLT 264 271 258 263   CBG:  Recent Labs Lab 08/26/13 1248 08/26/13 1628 08/26/13 1745 08/26/13 2151 08/27/13 0746  GLUCAP 262* 262* 278* 365* 204*     Signed:  Melton Alar, PA-C  [facility] 08/27/2013, 10:55 AM

## 2013-08-27 NOTE — Discharge Summary (Signed)
Patient seen, independently examined and chart reviewed. I agree with exam, assessment and plan discussed with Imogene Burn, PA-C.  She is feeling much better. Chronic medical issues appear stable. Plan discharge home on Augmentin with outpatient followup with Dr. Arnoldo Morale next week.  Murray Hodgkins, MD [facility] [phone]

## 2013-08-27 NOTE — Progress Notes (Signed)
Pt verbalizes understanding of d/c instructions, medications, and follow up appts. Pt has no questions at this time. IV d/c without complication. Pt has d/c instructions and prescriptions in hand. Pt d/c via wheelchair, accompanied by NT & her SO who will be taking her home. [name]

## 2013-08-27 NOTE — Progress Notes (Signed)
Inpatient Diabetes Program Recommendations  AACE/ADA: New Consensus Statement on Inpatient Glycemic Control (2013)  Target Ranges:  Prepandial:   less than 140 mg/dL      Peak postprandial:   less than 180 mg/dL (1-2 hours)      Critically ill patients:  140 - 180 mg/dL   Results for TRUTH, ELFMAN (MRN [id number]) as of 08/27/2013 07:59  Ref. Range 08/26/2013 08:12 08/26/2013 10:05 08/26/2013 12:48 08/26/2013 16:28 08/26/2013 17:45 08/26/2013 21:51 08/27/2013 07:46  Glucose-Capillary Latest Range: 70-99 mg/dL 191 (H) 183 (H) 262 (H) 262 (H) 278 (H) 365 (H) 204 (H)   Inpatient Diabetes Program Recommendations Correction (SSI): Please consider adding Novolog bedtime correction scale. Insulin - Meal Coverage: Please increase Novolog meal coverage to 15 units TID (as used at home).  Note: Noted Lantus was increased to 45 units QHS (same as home dose) yesterday and patient received last night at bedtime.  At bedtime, blood glucose was 365 mg/dl and fasting glucose this morning was 204 mg/dl.  Postprandial glucose consistently greater than 250 mg/dl yesterday.  Please consider ordering Novolog bedtime correction and increasing Novolog meal coverage to Novolog 15 units TID with meals.  Will continue to follow.  Thanks, Barnie Alderman, RN, MSN, CCRN Diabetes Coordinator Inpatient Diabetes Program [phone]

## 2013-08-28 ENCOUNTER — Ambulatory Visit (HOSPITAL_COMMUNITY): Admission: RE | Admit: 2013-08-28 | Payer: Medicare HMO | Source: Ambulatory Visit | Admitting: Gastroenterology

## 2013-08-28 HISTORY — DX: Hypothyroidism, unspecified: E03.9

## 2013-08-28 HISTORY — DX: Malignant (primary) neoplasm, unspecified: C80.1

## 2013-08-28 HISTORY — DX: Personal history of other medical treatment: Z92.89

## 2013-08-28 SURGERY — COLONOSCOPY
Anesthesia: Monitor Anesthesia Care

## 2013-08-29 LAB — CULTURE, ROUTINE-ABSCESS

## 2013-08-31 ENCOUNTER — Other Ambulatory Visit: Payer: Self-pay | Admitting: Gastroenterology

## 2013-08-31 LAB — ANAEROBIC CULTURE

## 2013-08-31 MED FILL — Oxycodone w/ Acetaminophen Tab 5-325 MG: ORAL | Qty: 6 | Status: AC

## 2013-09-04 ENCOUNTER — Encounter (HOSPITAL_COMMUNITY): Payer: Self-pay | Admitting: *Deleted

## 2013-09-07 ENCOUNTER — Inpatient Hospital Stay: Payer: Medicare HMO | Admitting: Family Medicine

## 2013-09-07 ENCOUNTER — Other Ambulatory Visit: Payer: Self-pay | Admitting: Family Medicine

## 2013-09-07 ENCOUNTER — Telehealth: Payer: Self-pay | Admitting: Family Medicine

## 2013-09-07 MED ORDER — OXYCODONE HCL 10 MG PO TABS: 10.0000 mg | ORAL_TABLET | Freq: Three times a day (TID) | ORAL | Status: DC | PRN

## 2013-09-07 NOTE — Telephone Encounter (Signed)
Novolog 100 u/mL inject 18 u TID Percocet 5-325 mg 1 q4 hours prn pain

## 2013-09-07 NOTE — Telephone Encounter (Signed)
rx refilled and pt picked up

## 2013-09-09 ENCOUNTER — Encounter (HOSPITAL_COMMUNITY): Payer: Self-pay | Admitting: Pharmacy Technician

## 2013-09-11 ENCOUNTER — Ambulatory Visit (INDEPENDENT_AMBULATORY_CARE_PROVIDER_SITE_OTHER): Payer: Medicare HMO | Admitting: Family Medicine

## 2013-09-11 ENCOUNTER — Emergency Department (HOSPITAL_COMMUNITY): Payer: Medicare HMO

## 2013-09-11 ENCOUNTER — Encounter: Payer: Self-pay | Admitting: Family Medicine

## 2013-09-11 ENCOUNTER — Ambulatory Visit
Admission: RE | Admit: 2013-09-11 | Discharge: 2013-09-11 | Disposition: A | Discharge status: Home / Self Care | Payer: Medicare HMO | Source: Ambulatory Visit | Attending: Family Medicine | Admitting: Family Medicine

## 2013-09-11 ENCOUNTER — Emergency Department (HOSPITAL_COMMUNITY)
Admission: EM | Admit: 2013-09-11 | Discharge: 2013-09-11 | Disposition: A | Discharge status: Home / Self Care | Payer: Medicare HMO | Attending: Emergency Medicine | Admitting: Emergency Medicine

## 2013-09-11 ENCOUNTER — Other Ambulatory Visit: Payer: Self-pay

## 2013-09-11 ENCOUNTER — Encounter (HOSPITAL_COMMUNITY): Payer: Self-pay

## 2013-09-11 VITALS — BP 124/86 | HR 78 | Temp 98.4°F | Resp 18 | Ht 63.0 in | Wt 264.0 lb

## 2013-09-11 DIAGNOSIS — Z794 Long term (current) use of insulin: Secondary | ICD-10-CM | POA: Insufficient documentation

## 2013-09-11 DIAGNOSIS — Z85528 Personal history of other malignant neoplasm of kidney: Secondary | ICD-10-CM | POA: Insufficient documentation

## 2013-09-11 DIAGNOSIS — Z8781 Personal history of (healed) traumatic fracture: Secondary | ICD-10-CM | POA: Insufficient documentation

## 2013-09-11 DIAGNOSIS — IMO0002 Reserved for concepts with insufficient information to code with codable children: Secondary | ICD-10-CM | POA: Insufficient documentation

## 2013-09-11 DIAGNOSIS — R071 Chest pain on breathing: Secondary | ICD-10-CM | POA: Insufficient documentation

## 2013-09-11 DIAGNOSIS — R091 Pleurisy: Secondary | ICD-10-CM

## 2013-09-11 DIAGNOSIS — Z8541 Personal history of malignant neoplasm of cervix uteri: Secondary | ICD-10-CM | POA: Insufficient documentation

## 2013-09-11 DIAGNOSIS — Z79899 Other long term (current) drug therapy: Secondary | ICD-10-CM | POA: Insufficient documentation

## 2013-09-11 DIAGNOSIS — R079 Chest pain, unspecified: Secondary | ICD-10-CM

## 2013-09-11 DIAGNOSIS — Z87448 Personal history of other diseases of urinary system: Secondary | ICD-10-CM | POA: Insufficient documentation

## 2013-09-11 DIAGNOSIS — Z8719 Personal history of other diseases of the digestive system: Secondary | ICD-10-CM | POA: Insufficient documentation

## 2013-09-11 DIAGNOSIS — R0781 Pleurodynia: Secondary | ICD-10-CM

## 2013-09-11 DIAGNOSIS — Z853 Personal history of malignant neoplasm of breast: Secondary | ICD-10-CM | POA: Insufficient documentation

## 2013-09-11 DIAGNOSIS — F410 Panic disorder [episodic paroxysmal anxiety] without agoraphobia: Secondary | ICD-10-CM | POA: Insufficient documentation

## 2013-09-11 DIAGNOSIS — E119 Type 2 diabetes mellitus without complications: Secondary | ICD-10-CM | POA: Insufficient documentation

## 2013-09-11 DIAGNOSIS — Z862 Personal history of diseases of the blood and blood-forming organs and certain disorders involving the immune mechanism: Secondary | ICD-10-CM | POA: Insufficient documentation

## 2013-09-11 LAB — BASIC METABOLIC PANEL
BUN: 26 mg/dL — ABNORMAL HIGH (ref 6–23)
CO2: 19 mEq/L (ref 19–32)
Calcium: 7.9 mg/dL — ABNORMAL LOW (ref 8.4–10.5)
Chloride: 100 mEq/L (ref 96–112)
Creatinine, Ser: 1.77 mg/dL — ABNORMAL HIGH (ref 0.50–1.10)
GFR calc Af Amer: 41 mL/min — ABNORMAL LOW (ref >90)
GFR calc non Af Amer: 35 mL/min — ABNORMAL LOW (ref >90)
Glucose, Bld: 94 mg/dL (ref 70–99)
Potassium: 4.2 mEq/L (ref 3.5–5.1)
Sodium: 134 mEq/L — ABNORMAL LOW (ref 135–145)

## 2013-09-11 LAB — CBC
HCT: 37.3 % (ref 36.0–46.0)
Hemoglobin: 12.3 g/dL (ref 12.0–15.0)
MCH: 27.3 pg (ref 26.0–34.0)
MCHC: 33 g/dL (ref 30.0–36.0)
MCV: 82.9 fL (ref 78.0–100.0)
Platelets: 264 10*3/uL (ref 150–400)
RBC: 4.5 MIL/uL (ref 3.87–5.11)
RDW: 14.3 % (ref 11.5–15.5)
WBC: 8.5 10*3/uL (ref 4.0–10.5)

## 2013-09-11 LAB — PRO B NATRIURETIC PEPTIDE: Pro B Natriuretic peptide (BNP): 131.8 pg/mL — ABNORMAL HIGH (ref 0–125)

## 2013-09-11 LAB — POCT I-STAT TROPONIN I: Troponin i, poc: 0 ng/mL (ref 0.00–0.08)

## 2013-09-11 MED ORDER — TECHNETIUM TO 99M ALBUMIN AGGREGATED
6.0000 | Freq: Once | INTRAVENOUS | Status: AC | PRN
  Administered 2013-09-11: 6 via INTRAVENOUS

## 2013-09-11 MED ORDER — HYDROCODONE-ACETAMINOPHEN 5-325 MG PO TABS
2.0000 | ORAL_TABLET | Freq: Once | ORAL | Status: AC
  Administered 2013-09-11: 2 via ORAL
  Filled 2013-09-11: qty 2

## 2013-09-11 MED ORDER — HYDROCODONE-ACETAMINOPHEN 5-325 MG PO TABS: 2.0000 | ORAL_TABLET | Freq: Four times a day (QID) | ORAL | Status: DC | PRN

## 2013-09-11 MED ORDER — TECHNETIUM TC 99M DIETHYLENETRIAME-PENTAACETIC ACID: 40.0000 | Freq: Once | INTRAVENOUS | Status: DC | PRN

## 2013-09-11 NOTE — ED Notes (Signed)
Patient transported to X-ray 

## 2013-09-11 NOTE — Progress Notes (Signed)
  Subjective:    Patient ID: [name], female    DOB: March 22, 1976, 37 y.o.   MRN: [id number]  HPI   Patient has a history of an allergic reaction to IV dye.  They refused to perform CT angio at [place].  I spoke to patient there and she states that chest pain is worsening.  I therefore, recommended she go to the emergency room for evaluation as I cannot premedicate through outpatient [place]. Review of Systems     Objective:   Physical Exam        Assessment & Plan:

## 2013-09-11 NOTE — ED Notes (Signed)
Pt O2 sat remained at 95%-98% while ambulating.

## 2013-09-11 NOTE — ED Notes (Signed)
Pt in Nuclear Medicine having a VQ Scan performed.

## 2013-09-11 NOTE — Progress Notes (Signed)
Subjective:    Patient ID: [name], female    DOB: [birth year], 37 y.o.   MRN: [id number]  HPI Patient is a 37 year old African American female who was recently hospitalized for rectal abscess. She underwent incision and drainage in the operating room. She was discharged from the hospital on September 5 with 7 days of Augmentin. She has done well since her hospital discharge until September 16.  Since that time she has developed worsening bilateral posterior pleurisy and shortness of breath it hurts deep inside her ribs. It hurts to take a deep breath in. She also complains of chest pain. She denies any fever although she has been coughing. She denies any hemoptysis. She denies any injury to the ribs. She has no pain with palpation of her ribs. There is no signs or symptoms of a rib fracture. Her past medical history is complicated by end-stage renal disease and Crohn's disease. Both of these require her to take chronic immunosuppressive therapy which puts her at high risk for hospital-acquired pneumonia. She is here today for an evaluation. On her exam she is standing due to the pain. She is breathing shallow. She is in his discomfort. She is not acutely short of breath and she is satting 98% on room air. Past Medical History  Diagnosis Date  . Chronic anemia   . Panic disorder   . Crohn's disease   . Anxiety     OCD  . 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 Fracture 05/01/2013    Right foot, in cast  . Hypothyroidism     parathroid runs low  . History of blood transfusion 2 years ago  . Complication of anesthesia     hard to sedate dr hung aware  . Cancer 2001    renal cell , breast cancer both breast 2011, cervical cancer 2013  . Renal disorder    Past Surgical History  Procedure Laterality Date  . Nephrectomy transplanted organ  1999 and 2011  . Parathyroidectomy  02-20-2001  . Knee surgery Left april 2003    arthroscopy  . Cystoscopy  02/13/2012    Procedure: CYSTOSCOPY;   Surgeon: Franchot Gallo, MD;  Location: [facility];  Service: Urology;  Laterality: N/A;  insertion of ureteral catheter , removed per Dr Diona Fanti   . Tubal ligation  02-19-2000  . Abdominal hysterectomy  02/13/2012    Procedure: HYSTERECTOMY ABDOMINAL;  Surgeon: Cyril Mourning, MD;  Location: [facility];  Service: Gynecology;  Laterality: N/A;  . Breast lumpectomy and reconstruction Bilateral 2011  . Esophageal manometry N/A 08/10/2013    Procedure: ESOPHAGEAL MANOMETRY (EM);  Surgeon: Beryle Beams, MD;  Location: [facility];  Service: Endoscopy;  Laterality: N/A;  . Incision and drainage abscess N/A 08/26/2013    Procedure: INCISION AND DRAINAGE PERIRECTAL ABSCESS;  Surgeon: Jamesetta So, MD;  Location: [facility];  Service: General;  Laterality: N/A;   Current Outpatient Prescriptions on File Prior to Visit  Medication Sig Dispense Refill  . atorvastatin (LIPITOR) 20 MG tablet Take 20 mg by mouth at bedtime.      . busPIRone (BUSPAR) 15 MG tablet Take 15 mg by mouth 2 (two) times daily.       . calcitRIOL (ROCALTROL) 0.25 MCG capsule Take 1.75 mcg by mouth every morning.      . calcium carbonate (OS-CAL - DOSED IN MG OF ELEMENTAL CALCIUM) 1250 MG tablet Take 1 tablet by mouth 3 (three) times daily.      Marland Kitchen  cetirizine (ZYRTEC) 10 MG tablet Take 10 mg by mouth every evening.      . cilostazol (PLETAL) 50 MG tablet Take 50 mg by mouth 2 (two) times daily.      Marland Kitchen FLUoxetine (PROZAC) 20 MG capsule Take 60 mg by mouth every morning.      . hydrOXYzine (ATARAX/VISTARIL) 25 MG tablet Take 50 mg by mouth at bedtime.       . insulin aspart (NOVOLOG) 100 UNIT/ML injection Inject 15 Units into the skin 3 (three) times daily before meals.  1 vial  12  . insulin glargine (LANTUS) 100 UNIT/ML injection Inject 45 Units into the skin at bedtime.       . metoCLOPramide (REGLAN) 5 MG tablet Take 5 mg by mouth 3 (three) times daily. Frequency unspecified      . mycophenolate (MYFORTIC) 180 MG EC tablet Take 540 mg  by mouth 2 (two) times daily.      Marland Kitchen NOVOLOG FLEXPEN 100 UNIT/ML SOPN FlexPen INJECT 18 UNITS INTO THE SKIN THREE TIMES DAILY BEFORE MEALS.  15 mL  5  . omeprazole (PRILOSEC) 40 MG capsule Take 40 mg by mouth 2 (two) times daily.       . Oxycodone HCl 10 MG TABS Take 1 tablet (10 mg total) by mouth 3 (three) times daily as needed. For pain  90 tablet  0  . oxyCODONE-acetaminophen (PERCOCET/ROXICET) 5-325 MG per tablet Take 1 tablet by mouth every 4 (four) hours as needed for pain.  15 tablet  0  . predniSONE (DELTASONE) 10 MG tablet Take 10 mg by mouth daily.      . tacrolimus (PROGRAF) 0.5 MG capsule Take 3.5 mg by mouth 2 (two) times daily. Take with 1mg  capsules for total of 3.5mg  total dose twice daily      . tacrolimus (PROGRAF) 1 MG capsule Take 3.5 mg by mouth 2 (two) times daily. Pt takes a total of 3.5 mg      . tiotropium (SPIRIVA) 18 MCG inhalation capsule Place 18 mcg into inhaler and inhale daily.      . Linaclotide (LINZESS) 145 MCG CAPS Take 145 mcg by mouth daily as needed (pain).        No current facility-administered medications on file prior to visit.   Allergies  Allergen Reactions  . Barium-Containing Compounds Shortness Of Breath  . Dilaudid [Hydromorphone Hcl] Anaphylaxis  . Hydromorphone Shortness Of Breath  . Ivp Dye [Iodinated Diagnostic Agents] Anaphylaxis  . Shellfish Allergy Shortness Of Breath and Itching    Causes red, patchy areas to form  . Vancomycin Anaphylaxis  . Adhesive [Tape] Other (See Comments)    Dermatitis   . Infed [Iron Dextran] Itching  . Sulfa Antibiotics Hives  . Iopamidol Itching and Rash   History   Social History  . Marital Status: Single    Spouse Name: N/A    Number of Children: N/A  . Years of Education: N/A   Occupational History  . Not on file.   Social History Main Topics  . Smoking status: Never Smoker   . Smokeless tobacco: Never Used  . Alcohol Use: No  . Drug Use: No  . Sexual Activity: Not on file   Other  Topics Concern  . Not on file   Social History Narrative  . No narrative on file      Review of Systems  All other systems reviewed and are negative.       Objective:   Physical Exam  Vitals reviewed. Constitutional: She appears well-developed and well-nourished.  HENT:  Head: Normocephalic.  Right Ear: External ear normal.  Nose: Nose normal.  Mouth/Throat: Oropharynx is clear and moist.  Eyes: Conjunctivae are normal. No scleral icterus.  Neck: Neck supple. No JVD present. No thyromegaly present.  Cardiovascular: Normal rate, regular rhythm and normal heart sounds.   Pulmonary/Chest: Effort normal. No respiratory distress. She has no wheezes. She has no rales. She exhibits no tenderness.  Abdominal: Soft. Bowel sounds are normal. She exhibits no distension. There is no tenderness. There is no rebound.  Musculoskeletal: She exhibits no edema.  Lymphadenopathy:    She has no cervical adenopathy.          Assessment & Plan:  1. Pleurisy - CT Angio Chest W/Cm &/Or Wo Cm; Future  2. Chest pain, unspecified - CT Angio Chest W/Cm &/Or Wo Cm; Future  The patient's history is concerning for possibly a PE or a hospital-acquired pneumonia. I am going to send the patient for a stat CT angiogram of the chest to rule out pulmonary embolism. I will also evaluate for a possible pneumonia although her exam today shows no evidence of a pneumonia.  Further diagnostic plans will be dependent upon the results of the CT angiogram.  If there is a pulmonary embolism I will refer the patient to the hospital for anticoagulations. If there is evidence of a pneumonia, it would be a hospital-acquired pneumonia and I would recommend evaluation at the hospital for inpatient IV therapy given her immunocompromise state.

## 2013-09-11 NOTE — ED Provider Notes (Signed)
CSN: [id number]     Arrival date & time 09/11/13  1422 History   First MD Initiated Contact with Patient 09/11/13 1605     Chief Complaint  Patient presents with  . Shortness of Breath   (Consider location/radiation/quality/duration/timing/severity/associated sxs/prior Treatment) Patient is a 37 y.o. female presenting with shortness of breath.  Shortness of Breath  Pt with history of multiple medical problems including kidney transplant with baseline Cr around 1.7 recently admitted for surgical drainage of pilonidal cyst. Reports 3-4 days of moderate dyspnea, pleuritic bilateral lower rib pain with deep breath. SOB is worse with walking. She was seen by PCP today who sent her for outpatient CTA chest but due to reported history of contrast allergy was sent to the ED instead. Denies leg swelling but states she feels 'puffy all over' and has had 21lb weight gain although she suspects this is an issue with the scales.   Past Medical History  Diagnosis Date  . Chronic anemia   . Panic disorder   . Crohn's disease   . Anxiety     OCD  . 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 Fracture 05/01/2013    Right foot, in cast  . Hypothyroidism     parathroid runs low  . History of blood transfusion 2 years ago  . Complication of anesthesia     hard to sedate dr hung aware  . Cancer 2001    renal cell , breast cancer both breast 2011, cervical cancer 2013  . Renal disorder    Past Surgical History  Procedure Laterality Date  . Nephrectomy transplanted organ  1999 and 2011  . Parathyroidectomy  02-20-2001  . Knee surgery Left april 2003    arthroscopy  . Cystoscopy  02/13/2012    Procedure: CYSTOSCOPY;  Surgeon: Franchot Gallo, MD;  Location: [facility];  Service: Urology;  Laterality: N/A;  insertion of ureteral catheter , removed per Dr Diona Fanti   . Tubal ligation  02-19-2000  . Abdominal hysterectomy  02/13/2012    Procedure: HYSTERECTOMY ABDOMINAL;  Surgeon: Cyril Mourning, MD;  Location:  [facility];  Service: Gynecology;  Laterality: N/A;  . Breast lumpectomy and reconstruction Bilateral 2011  . Esophageal manometry N/A 08/10/2013    Procedure: ESOPHAGEAL MANOMETRY (EM);  Surgeon: Beryle Beams, MD;  Location: [facility];  Service: Endoscopy;  Laterality: N/A;  . Incision and drainage abscess N/A 08/26/2013    Procedure: INCISION AND DRAINAGE PERIRECTAL ABSCESS;  Surgeon: Jamesetta So, MD;  Location: [facility];  Service: General;  Laterality: N/A;   History reviewed. No pertinent family history. History  Substance Use Topics  . Smoking status: Never Smoker   . Smokeless tobacco: Never Used  . Alcohol Use: No   OB History   Grav Para Term Preterm Abortions TAB SAB Ect Mult Living                 Review of Systems  Respiratory: Positive for shortness of breath.    All other systems reviewed and are negative except as noted in HPI.   Allergies  Barium-containing compounds; Dilaudid; Hydromorphone; Ivp dye; Shellfish allergy; Vancomycin; Adhesive; Infed; Sulfa antibiotics; and Iopamidol  Home Medications   Current Outpatient Rx  Name  Route  Sig  Dispense  Refill  . atorvastatin (LIPITOR) 20 MG tablet   Oral   Take 20 mg by mouth at bedtime.         . busPIRone (BUSPAR) 15 MG tablet   Oral  Take 15 mg by mouth 2 (two) times daily.          . calcitRIOL (ROCALTROL) 0.25 MCG capsule   Oral   Take 1.75 mcg by mouth every morning.         . calcium carbonate (OS-CAL - DOSED IN MG OF ELEMENTAL CALCIUM) 1250 MG tablet   Oral   Take 1 tablet by mouth 3 (three) times daily.         . cetirizine (ZYRTEC) 10 MG tablet   Oral   Take 10 mg by mouth every evening.         . cilostazol (PLETAL) 50 MG tablet   Oral   Take 50 mg by mouth 2 (two) times daily.         Marland Kitchen FLUoxetine (PROZAC) 20 MG capsule   Oral   Take 60 mg by mouth every morning.         . hydrOXYzine (ATARAX/VISTARIL) 25 MG tablet   Oral   Take 50 mg by mouth at bedtime.           . insulin aspart (NOVOLOG) 100 UNIT/ML injection   Subcutaneous   Inject 15 Units into the skin 3 (three) times daily before meals.   1 vial   12   . insulin glargine (LANTUS) 100 UNIT/ML injection   Subcutaneous   Inject 45 Units into the skin at bedtime.          . Linaclotide (LINZESS) 145 MCG CAPS   Oral   Take 145 mcg by mouth daily as needed (pain).          Marland Kitchen metoCLOPramide (REGLAN) 5 MG tablet   Oral   Take 5 mg by mouth 3 (three) times daily. Frequency unspecified         . mycophenolate (MYFORTIC) 180 MG EC tablet   Oral   Take 540 mg by mouth 2 (two) times daily.         Marland Kitchen NOVOLOG FLEXPEN 100 UNIT/ML SOPN FlexPen      INJECT 18 UNITS INTO THE SKIN THREE TIMES DAILY BEFORE MEALS.   15 mL   5   . omeprazole (PRILOSEC) 40 MG capsule   Oral   Take 40 mg by mouth 2 (two) times daily.          . Oxycodone HCl 10 MG TABS   Oral   Take 1 tablet (10 mg total) by mouth 3 (three) times daily as needed. For pain   90 tablet   0   . oxyCODONE-acetaminophen (PERCOCET/ROXICET) 5-325 MG per tablet   Oral   Take 1 tablet by mouth every 4 (four) hours as needed for pain.   15 tablet   0   . predniSONE (DELTASONE) 10 MG tablet   Oral   Take 10 mg by mouth daily.         . tacrolimus (PROGRAF) 0.5 MG capsule   Oral   Take 3.5 mg by mouth 2 (two) times daily. Take with 1mg  capsules for total of 3.5mg  total dose twice daily         . tacrolimus (PROGRAF) 1 MG capsule   Oral   Take 3.5 mg by mouth 2 (two) times daily. Pt takes a total of 3.5 mg         . tiotropium (SPIRIVA) 18 MCG inhalation capsule   Inhalation   Place 18 mcg into inhaler and inhale daily.          BP 119/69  Pulse 90  Temp(Src) 98.5 F (36.9 C) (Oral)  Resp 24  SpO2 97%  LMP 01/30/2012 Physical Exam  Nursing note and vitals reviewed. Constitutional: She is oriented to person, place, and time. She appears well-developed and well-nourished.  HENT:  Head: Normocephalic  and atraumatic.  Eyes: EOM are normal. Pupils are equal, round, and reactive to light.  Neck: Normal range of motion. Neck supple.  Cardiovascular: Normal rate, normal heart sounds and intact distal pulses.   Pulmonary/Chest: Effort normal and breath sounds normal. No respiratory distress. She has no wheezes. She has no rales. She exhibits tenderness.  Abdominal: Bowel sounds are normal. She exhibits no distension. There is no tenderness.  Musculoskeletal: Normal range of motion. She exhibits no edema and no tenderness.  Neurological: She is alert and oriented to person, place, and time. She has normal strength. No cranial nerve deficit or sensory deficit.  Skin: Skin is warm and dry. No rash noted.  Psychiatric: She has a normal mood and affect.    ED Course  Procedures (including critical care time) Labs Review Labs Reviewed  BASIC METABOLIC PANEL - Abnormal; Notable for the following:    Sodium 134 (*)    BUN 26 (*)    Creatinine, Ser 1.77 (*)    Calcium 7.9 (*)    GFR calc non Af Amer 35 (*)    GFR calc Af Amer 41 (*)    All other components within normal limits  PRO B NATRIURETIC PEPTIDE - Abnormal; Notable for the following:    Pro B Natriuretic peptide (BNP) 131.8 (*)    All other components within normal limits  CBC  POCT I-STAT TROPONIN I   Imaging Review Dg Chest 2 View  09/11/2013   EXAM: CHEST  2 VIEW  COMPARISON:  None.  FINDINGS: The heart size and mediastinal contours are within normal limits. Both lungs are clear. The visualized skeletal structures are unremarkable.  IMPRESSION: No active cardiopulmonary disease.   Electronically Signed   By: Kerby Moors M.D.   On: 09/11/2013 17:49   Nm Pulmonary Perf And Vent  09/11/2013   *RADIOLOGY REPORT*  Clinical Data: Of breath and chest pain.  NM PULMONARY VENTILATION AND PERFUSION SCAN  Radiopharmaceutical: 6MILLI CURIE MAA TECHNETIUM 62M ALBUMIN AGGREGATED IV.  40 mCi technetium 49mtc  DTPA inhaled.  Comparison: Chest  x-ray dated 09/11/2013  Findings: The ventilation exam is normal.  The perfusion study is normal.  IMPRESSION: Normal ventilation perfusion study.  No evidence of pulmonary embolism.   Original Report Authenticated By: Lorriane Shire, M.D.    MDM   1. Pleuritic chest pain     Pt with baseline renal failure in single transplanted kidney is not a candidate for angiogram regardless of contrast status.Given recent admission will send for V/Q scan.   6:56 PM Labs and imaging reviewed and unremarkable. No concern for ACS given atypical symptoms. Neg V/Q. Pt with mild-moderate pleuritic pain, advised pain medications but avoid NSAIDs due to kidney disease. PCP follow up for recheck if not improving.     Kamdin Follett B. Karle Starch, MD 09/11/13 [phone]

## 2013-09-11 NOTE — ED Notes (Addendum)
Pt c/o SOB and pain to her lungs and chest with deep breathing x4 days. Pt seen at her PCP and sent her to have a CT scan with contrast to rule out a PE due to an allergy to IV contrast . Pt reports pain worsens with deep breathing and movement

## 2013-09-17 ENCOUNTER — Telehealth: Payer: Self-pay | Admitting: Family Medicine

## 2013-09-17 NOTE — Telephone Encounter (Signed)
I made an appt for the pt tomorrow at 11:00. Pt aware.

## 2013-09-17 NOTE — Telephone Encounter (Signed)
Pt said that she is still having a lot of pain that even the pain medication is not touching. She wants to know what else she can do. She has a follow up appt with you on Tuesday. She also wants to know if you want her to get the cmv titer before she comes in Tuesday.

## 2013-09-17 NOTE — Telephone Encounter (Signed)
Please work her in tomorrow.

## 2013-09-18 ENCOUNTER — Encounter: Payer: Self-pay | Admitting: Family Medicine

## 2013-09-18 ENCOUNTER — Ambulatory Visit (INDEPENDENT_AMBULATORY_CARE_PROVIDER_SITE_OTHER): Payer: Medicare HMO | Admitting: Family Medicine

## 2013-09-18 VITALS — BP 122/70 | HR 78 | Temp 98.1°F | Resp 18 | Ht 63.0 in | Wt 265.0 lb

## 2013-09-18 DIAGNOSIS — R091 Pleurisy: Secondary | ICD-10-CM

## 2013-09-18 MED ORDER — PREDNISONE 20 MG PO TABS: ORAL_TABLET | ORAL | Status: DC

## 2013-09-18 NOTE — Progress Notes (Signed)
Subjective:    Patient ID: [name], female    DOB: 1976-03-26, 37 y.o.   MRN: [id number]  HPI 09/11/13 Patient is a 37 year old African American female who was recently hospitalized for rectal abscess. She underwent incision and drainage in the operating room. She was discharged from the hospital on September 5 with 7 days of Augmentin. She has done well since her hospital discharge until September 16.  Since that time she has developed worsening bilateral posterior pleurisy and shortness of breath it hurts deep inside her ribs. It hurts to take a deep breath in. She also complains of chest pain. She denies any fever although she has been coughing. She denies any hemoptysis. She denies any injury to the ribs. She has no pain with palpation of her ribs. There is no signs or symptoms of a rib fracture. Her past medical history is complicated by end-stage renal disease and Crohn's disease. Both of these require her to take chronic immunosuppressive therapy which puts her at high risk for hospital-acquired pneumonia. She is here today for an evaluation. On her exam she is standing due to the pain. She is breathing shallow. She is in his discomfort. She is not acutely short of breath and she is satting 98% on room air. At that time, my plan was to rule out PE.  Patient was referred to ED for VQ scan which was negative. CXR in ER revealed: The heart size and mediastinal contours are within normal limits.  Both lungs are clear. The visualized skeletal structures are  unremarkable.  IMPRESSION:  No active cardiopulmonary disease.  She returns today continuing to complain of pleurisy in the bilateral lower posterior ribs.  SHe denies shortness of breath.  She denies fevers, chills, hemoptysis. Past Medical History  Diagnosis Date  . Chronic anemia   . Panic disorder   . Crohn's disease   . Anxiety     OCD  . 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 Fracture 05/01/2013    Right foot, in cast  .  Hypothyroidism     parathroid runs low  . History of blood transfusion 2 years ago  . Complication of anesthesia     hard to sedate dr hung aware  . Cancer 2001    renal cell , breast cancer both breast 2011, cervical cancer 2013  . Renal disorder    Past Surgical History  Procedure Laterality Date  . Nephrectomy transplanted organ  1999 and 2011  . Parathyroidectomy  02-20-2001  . Knee surgery Left april 2003    arthroscopy  . Cystoscopy  02/13/2012    Procedure: CYSTOSCOPY;  Surgeon: Franchot Gallo, MD;  Location: [facility];  Service: Urology;  Laterality: N/A;  insertion of ureteral catheter , removed per Dr Diona Fanti   . Tubal ligation  02-19-2000  . Abdominal hysterectomy  02/13/2012    Procedure: HYSTERECTOMY ABDOMINAL;  Surgeon: Cyril Mourning, MD;  Location: [facility];  Service: Gynecology;  Laterality: N/A;  . Breast lumpectomy and reconstruction Bilateral 2011  . Esophageal manometry N/A 08/10/2013    Procedure: ESOPHAGEAL MANOMETRY (EM);  Surgeon: Beryle Beams, MD;  Location: [facility];  Service: Endoscopy;  Laterality: N/A;  . Incision and drainage abscess N/A 08/26/2013    Procedure: INCISION AND DRAINAGE PERIRECTAL ABSCESS;  Surgeon: Jamesetta So, MD;  Location: [facility];  Service: General;  Laterality: N/A;   Current Outpatient Prescriptions on File Prior to Visit  Medication Sig Dispense Refill  . atorvastatin (  LIPITOR) 20 MG tablet Take 20 mg by mouth at bedtime.      . busPIRone (BUSPAR) 15 MG tablet Take 15 mg by mouth 2 (two) times daily.       . calcitRIOL (ROCALTROL) 0.25 MCG capsule Take 1.75 mcg by mouth every morning.      . calcium carbonate (OS-CAL - DOSED IN MG OF ELEMENTAL CALCIUM) 1250 MG tablet Take 1 tablet by mouth 3 (three) times daily.      . cetirizine (ZYRTEC) 10 MG tablet Take 10 mg by mouth every evening.      . cilostazol (PLETAL) 50 MG tablet Take 50 mg by mouth 2 (two) times daily.      Marland Kitchen FLUoxetine (PROZAC) 20 MG capsule Take 60 mg by mouth  every morning.      Marland Kitchen HYDROcodone-acetaminophen (NORCO/VICODIN) 5-325 MG per tablet Take 2 tablets by mouth every 6 (six) hours as needed for pain.  30 tablet  0  . hydrOXYzine (ATARAX/VISTARIL) 25 MG tablet Take 50 mg by mouth at bedtime.       . insulin aspart (NOVOLOG) 100 UNIT/ML injection Inject 15 Units into the skin 3 (three) times daily before meals.  1 vial  12  . insulin glargine (LANTUS) 100 UNIT/ML injection Inject 45 Units into the skin at bedtime.       . Linaclotide (LINZESS) 145 MCG CAPS Take 145 mcg by mouth daily as needed (pain).       Marland Kitchen metoCLOPramide (REGLAN) 5 MG tablet Take 5 mg by mouth 3 (three) times daily. Frequency unspecified      . mycophenolate (MYFORTIC) 180 MG EC tablet Take 540 mg by mouth 2 (two) times daily.      Marland Kitchen NOVOLOG FLEXPEN 100 UNIT/ML SOPN FlexPen INJECT 18 UNITS INTO THE SKIN THREE TIMES DAILY BEFORE MEALS.  15 mL  5  . omeprazole (PRILOSEC) 40 MG capsule Take 40 mg by mouth 2 (two) times daily.       . Oxycodone HCl 10 MG TABS Take 1 tablet (10 mg total) by mouth 3 (three) times daily as needed. For pain  90 tablet  0  . oxyCODONE-acetaminophen (PERCOCET/ROXICET) 5-325 MG per tablet Take 1 tablet by mouth every 4 (four) hours as needed for pain.  15 tablet  0  . predniSONE (DELTASONE) 10 MG tablet Take 10 mg by mouth daily.      . tacrolimus (PROGRAF) 0.5 MG capsule Take 3.5 mg by mouth 2 (two) times daily. Take with 1mg  capsules for total of 3.5mg  total dose twice daily      . tacrolimus (PROGRAF) 1 MG capsule Take 3.5 mg by mouth 2 (two) times daily. Pt takes a total of 3.5 mg      . tiotropium (SPIRIVA) 18 MCG inhalation capsule Place 18 mcg into inhaler and inhale daily.       No current facility-administered medications on file prior to visit.   Allergies  Allergen Reactions  . Barium-Containing Compounds Shortness Of Breath  . Dilaudid [Hydromorphone Hcl] Anaphylaxis  . Hydromorphone Shortness Of Breath  . Ivp Dye [Iodinated Diagnostic  Agents] Anaphylaxis  . Shellfish Allergy Shortness Of Breath and Itching    Causes red, patchy areas to form  . Vancomycin Anaphylaxis  . Adhesive [Tape] Other (See Comments)    Dermatitis   . Infed [Iron Dextran] Itching  . Sulfa Antibiotics Hives  . Iopamidol Itching and Rash   History   Social History  . Marital Status: Single  Spouse Name: N/A    Number of Children: N/A  . Years of Education: N/A   Occupational History  . Not on file.   Social History Main Topics  . Smoking status: Never Smoker   . Smokeless tobacco: Never Used  . Alcohol Use: No  . Drug Use: No  . Sexual Activity: Not on file   Other Topics Concern  . Not on file   Social History Narrative  . No narrative on file       Review of Systems  All other systems reviewed and are negative.       Objective:   Physical Exam  Vitals reviewed. Constitutional: She is oriented to person, place, and time.  Neck: Neck supple. No JVD present. No thyromegaly present.  Cardiovascular: Normal rate and regular rhythm.  Exam reveals no friction rub.   Murmur heard. Pulmonary/Chest: Effort normal and breath sounds normal. No respiratory distress. She has no wheezes. She has no rales. She exhibits no tenderness.  Abdominal: Soft. Bowel sounds are normal. She exhibits no distension and no mass. There is no tenderness. There is no rebound and no guarding.  Musculoskeletal: She exhibits no edema.  Lymphadenopathy:    She has no cervical adenopathy.  Neurological: She is alert and oriented to person, place, and time.  Psychiatric: Her mood appears anxious.   EKG shows normal sinus rhythm at 90 beats per minute with a left axis deviation of -10. There is no ST segment elevation or PR depression consistent with pericarditis.        Assessment & Plan:  1. Pleurisy At this point I believe we have successfully ruled out other dangerous causes of pleurisy. She's had a VQ scan to rule out pulmonary embolism.  She's had a chest x-ray that was out pneumonia, pleural effusion, and malignancy. She's had an EKG which rules out pericarditis. Furthermore her exam is not consistent with pericarditis. The I believe her pain is due to the idiopathic inflammation of the pleura. Start with a prednisone taper and recheck the patient in one week. If her pain improves or resolves no further workup is necessary. If her pain continues I would next consider imaging of her kidneys with a renal ultrasound. - EKG 12-Lead - predniSONE (DELTASONE) 20 MG tablet; 3 tabs poqday 1-2, 2 tabs poqday 3-4, 1 poqday 5-6  Dispense: 12 tablet; Refill: 0

## 2013-09-22 ENCOUNTER — Ambulatory Visit: Payer: Medicare HMO | Admitting: Family Medicine

## 2013-09-24 ENCOUNTER — Ambulatory Visit: Payer: Medicare HMO | Admitting: Family Medicine

## 2013-09-25 ENCOUNTER — Encounter (HOSPITAL_COMMUNITY): Payer: Self-pay | Admitting: *Deleted

## 2013-09-25 ENCOUNTER — Encounter (HOSPITAL_COMMUNITY): Payer: Self-pay | Admitting: Certified Registered Nurse Anesthetist

## 2013-09-25 ENCOUNTER — Ambulatory Visit (HOSPITAL_COMMUNITY): Payer: Medicare HMO | Admitting: Certified Registered Nurse Anesthetist

## 2013-09-25 ENCOUNTER — Encounter (HOSPITAL_COMMUNITY)
Admission: RE | Disposition: A | Discharge status: Home / Self Care | Payer: Self-pay | Source: Ambulatory Visit | Attending: Gastroenterology

## 2013-09-25 ENCOUNTER — Other Ambulatory Visit: Payer: Self-pay | Admitting: Gastroenterology

## 2013-09-25 ENCOUNTER — Ambulatory Visit (HOSPITAL_COMMUNITY)
Admission: RE | Admit: 2013-09-25 | Discharge: 2013-09-25 | Disposition: A | Discharge status: Home / Self Care | Payer: Medicare HMO | Source: Ambulatory Visit | Attending: Gastroenterology | Admitting: Gastroenterology

## 2013-09-25 DIAGNOSIS — K644 Residual hemorrhoidal skin tags: Secondary | ICD-10-CM | POA: Insufficient documentation

## 2013-09-25 DIAGNOSIS — K219 Gastro-esophageal reflux disease without esophagitis: Secondary | ICD-10-CM | POA: Insufficient documentation

## 2013-09-25 DIAGNOSIS — D126 Benign neoplasm of colon, unspecified: Secondary | ICD-10-CM | POA: Insufficient documentation

## 2013-09-25 DIAGNOSIS — K5730 Diverticulosis of large intestine without perforation or abscess without bleeding: Secondary | ICD-10-CM | POA: Insufficient documentation

## 2013-09-25 DIAGNOSIS — Z90710 Acquired absence of both cervix and uterus: Secondary | ICD-10-CM | POA: Insufficient documentation

## 2013-09-25 DIAGNOSIS — R091 Pleurisy: Secondary | ICD-10-CM | POA: Insufficient documentation

## 2013-09-25 DIAGNOSIS — R0602 Shortness of breath: Secondary | ICD-10-CM | POA: Insufficient documentation

## 2013-09-25 DIAGNOSIS — K648 Other hemorrhoids: Secondary | ICD-10-CM | POA: Insufficient documentation

## 2013-09-25 DIAGNOSIS — K5090 Crohn's disease, unspecified, without complications: Secondary | ICD-10-CM | POA: Insufficient documentation

## 2013-09-25 DIAGNOSIS — E039 Hypothyroidism, unspecified: Secondary | ICD-10-CM | POA: Insufficient documentation

## 2013-09-25 DIAGNOSIS — E119 Type 2 diabetes mellitus without complications: Secondary | ICD-10-CM | POA: Insufficient documentation

## 2013-09-25 DIAGNOSIS — Z940 Kidney transplant status: Secondary | ICD-10-CM | POA: Insufficient documentation

## 2013-09-25 DIAGNOSIS — Z794 Long term (current) use of insulin: Secondary | ICD-10-CM | POA: Insufficient documentation

## 2013-09-25 DIAGNOSIS — Z8541 Personal history of malignant neoplasm of cervix uteri: Secondary | ICD-10-CM | POA: Insufficient documentation

## 2013-09-25 DIAGNOSIS — Z853 Personal history of malignant neoplasm of breast: Secondary | ICD-10-CM | POA: Insufficient documentation

## 2013-09-25 DIAGNOSIS — R079 Chest pain, unspecified: Secondary | ICD-10-CM | POA: Insufficient documentation

## 2013-09-25 DIAGNOSIS — N186 End stage renal disease: Secondary | ICD-10-CM | POA: Insufficient documentation

## 2013-09-25 DIAGNOSIS — K449 Diaphragmatic hernia without obstruction or gangrene: Secondary | ICD-10-CM | POA: Insufficient documentation

## 2013-09-25 DIAGNOSIS — Z8553 Personal history of malignant neoplasm of renal pelvis: Secondary | ICD-10-CM | POA: Insufficient documentation

## 2013-09-25 HISTORY — PX: ESOPHAGOGASTRODUODENOSCOPY: SHX5428

## 2013-09-25 HISTORY — PX: COLONOSCOPY: SHX5424

## 2013-09-25 HISTORY — DX: Other specified postprocedural states: Z98.890

## 2013-09-25 HISTORY — DX: Nausea with vomiting, unspecified: R11.2

## 2013-09-25 LAB — GLUCOSE, CAPILLARY: Glucose-Capillary: 134 mg/dL — ABNORMAL HIGH (ref 70–99)

## 2013-09-25 SURGERY — EGD (ESOPHAGOGASTRODUODENOSCOPY)
Anesthesia: Monitor Anesthesia Care

## 2013-09-25 MED ORDER — SODIUM CHLORIDE 0.9 % IV SOLN: INTRAVENOUS | Status: DC

## 2013-09-25 MED ORDER — KETAMINE HCL 10 MG/ML IJ SOLN
INTRAMUSCULAR | Status: DC | PRN
  Administered 2013-09-25: 30 mg via INTRAVENOUS

## 2013-09-25 MED ORDER — PROPOFOL INFUSION 10 MG/ML OPTIME
INTRAVENOUS | Status: DC | PRN
  Administered 2013-09-25: 180 ug/kg/min via INTRAVENOUS

## 2013-09-25 MED ORDER — LIDOCAINE HCL (CARDIAC) 20 MG/ML IV SOLN
INTRAVENOUS | Status: DC | PRN
  Administered 2013-09-25: 100 mg via INTRAVENOUS

## 2013-09-25 MED ORDER — LACTATED RINGERS IV SOLN
INTRAVENOUS | Status: DC
  Administered 2013-09-25: 1000 mL via INTRAVENOUS

## 2013-09-25 MED ORDER — BUTAMBEN-TETRACAINE-BENZOCAINE 2-2-14 % EX AERO
INHALATION_SPRAY | CUTANEOUS | Status: DC | PRN
  Administered 2013-09-25: 2 via TOPICAL

## 2013-09-25 MED ORDER — MIDAZOLAM HCL 5 MG/5ML IJ SOLN
INTRAMUSCULAR | Status: DC | PRN
  Administered 2013-09-25 (×2): 2 mg via INTRAVENOUS

## 2013-09-25 MED ORDER — ONDANSETRON HCL 4 MG/2ML IJ SOLN
INTRAMUSCULAR | Status: DC | PRN
  Administered 2013-09-25: 4 mg via INTRAVENOUS

## 2013-09-25 NOTE — H&P (View-Only) (Signed)
Subjective:    Patient ID: [name], female    DOB: [birth year], 37 y.o.   MRN: [id number]  HPI 09/11/13 Patient is a 37 year old African American female who was recently hospitalized for rectal abscess. She underwent incision and drainage in the operating room. She was discharged from the hospital on September 5 with 7 days of Augmentin. She has done well since her hospital discharge until September 16.  Since that time she has developed worsening bilateral posterior pleurisy and shortness of breath it hurts deep inside her ribs. It hurts to take a deep breath in. She also complains of chest pain. She denies any fever although she has been coughing. She denies any hemoptysis. She denies any injury to the ribs. She has no pain with palpation of her ribs. There is no signs or symptoms of a rib fracture. Her past medical history is complicated by end-stage renal disease and Crohn's disease. Both of these require her to take chronic immunosuppressive therapy which puts her at high risk for hospital-acquired pneumonia. She is here today for an evaluation. On her exam she is standing due to the pain. She is breathing shallow. She is in his discomfort. She is not acutely short of breath and she is satting 98% on room air. At that time, my plan was to rule out PE.  Patient was referred to ED for VQ scan which was negative. CXR in ER revealed: The heart size and mediastinal contours are within normal limits.  Both lungs are clear. The visualized skeletal structures are  unremarkable.  IMPRESSION:  No active cardiopulmonary disease.  She returns today continuing to complain of pleurisy in the bilateral lower posterior ribs.  SHe denies shortness of breath.  She denies fevers, chills, hemoptysis. Past Medical History  Diagnosis Date  . Chronic anemia   . Panic disorder   . Crohn's disease   . Anxiety     OCD  . 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 Fracture 05/01/2013    Right foot, in cast  .  Hypothyroidism     parathroid runs low  . History of blood transfusion 2 years ago  . Complication of anesthesia     hard to sedate dr hung aware  . Cancer 2001    renal cell , breast cancer both breast 2011, cervical cancer 2013  . Renal disorder    Past Surgical History  Procedure Laterality Date  . Nephrectomy transplanted organ  1999 and 2011  . Parathyroidectomy  02-20-2001  . Knee surgery Left april 2003    arthroscopy  . Cystoscopy  02/13/2012    Procedure: CYSTOSCOPY;  Surgeon: Franchot Gallo, MD;  Location: [facility];  Service: Urology;  Laterality: N/A;  insertion of ureteral catheter , removed per Dr Diona Fanti   . Tubal ligation  02-19-2000  . Abdominal hysterectomy  02/13/2012    Procedure: HYSTERECTOMY ABDOMINAL;  Surgeon: Cyril Mourning, MD;  Location: [facility];  Service: Gynecology;  Laterality: N/A;  . Breast lumpectomy and reconstruction Bilateral 2011  . Esophageal manometry N/A 08/10/2013    Procedure: ESOPHAGEAL MANOMETRY (EM);  Surgeon: Beryle Beams, MD;  Location: [facility];  Service: Endoscopy;  Laterality: N/A;  . Incision and drainage abscess N/A 08/26/2013    Procedure: INCISION AND DRAINAGE PERIRECTAL ABSCESS;  Surgeon: Jamesetta So, MD;  Location: [facility];  Service: General;  Laterality: N/A;   Current Outpatient Prescriptions on File Prior to Visit  Medication Sig Dispense Refill  . atorvastatin (  LIPITOR) 20 MG tablet Take 20 mg by mouth at bedtime.      . busPIRone (BUSPAR) 15 MG tablet Take 15 mg by mouth 2 (two) times daily.       . calcitRIOL (ROCALTROL) 0.25 MCG capsule Take 1.75 mcg by mouth every morning.      . calcium carbonate (OS-CAL - DOSED IN MG OF ELEMENTAL CALCIUM) 1250 MG tablet Take 1 tablet by mouth 3 (three) times daily.      . cetirizine (ZYRTEC) 10 MG tablet Take 10 mg by mouth every evening.      . cilostazol (PLETAL) 50 MG tablet Take 50 mg by mouth 2 (two) times daily.      Marland Kitchen FLUoxetine (PROZAC) 20 MG capsule Take 60 mg by mouth  every morning.      Marland Kitchen HYDROcodone-acetaminophen (NORCO/VICODIN) 5-325 MG per tablet Take 2 tablets by mouth every 6 (six) hours as needed for pain.  30 tablet  0  . hydrOXYzine (ATARAX/VISTARIL) 25 MG tablet Take 50 mg by mouth at bedtime.       . insulin aspart (NOVOLOG) 100 UNIT/ML injection Inject 15 Units into the skin 3 (three) times daily before meals.  1 vial  12  . insulin glargine (LANTUS) 100 UNIT/ML injection Inject 45 Units into the skin at bedtime.       . Linaclotide (LINZESS) 145 MCG CAPS Take 145 mcg by mouth daily as needed (pain).       Marland Kitchen metoCLOPramide (REGLAN) 5 MG tablet Take 5 mg by mouth 3 (three) times daily. Frequency unspecified      . mycophenolate (MYFORTIC) 180 MG EC tablet Take 540 mg by mouth 2 (two) times daily.      Marland Kitchen NOVOLOG FLEXPEN 100 UNIT/ML SOPN FlexPen INJECT 18 UNITS INTO THE SKIN THREE TIMES DAILY BEFORE MEALS.  15 mL  5  . omeprazole (PRILOSEC) 40 MG capsule Take 40 mg by mouth 2 (two) times daily.       . Oxycodone HCl 10 MG TABS Take 1 tablet (10 mg total) by mouth 3 (three) times daily as needed. For pain  90 tablet  0  . oxyCODONE-acetaminophen (PERCOCET/ROXICET) 5-325 MG per tablet Take 1 tablet by mouth every 4 (four) hours as needed for pain.  15 tablet  0  . predniSONE (DELTASONE) 10 MG tablet Take 10 mg by mouth daily.      . tacrolimus (PROGRAF) 0.5 MG capsule Take 3.5 mg by mouth 2 (two) times daily. Take with 1mg  capsules for total of 3.5mg  total dose twice daily      . tacrolimus (PROGRAF) 1 MG capsule Take 3.5 mg by mouth 2 (two) times daily. Pt takes a total of 3.5 mg      . tiotropium (SPIRIVA) 18 MCG inhalation capsule Place 18 mcg into inhaler and inhale daily.       No current facility-administered medications on file prior to visit.   Allergies  Allergen Reactions  . Barium-Containing Compounds Shortness Of Breath  . Dilaudid [Hydromorphone Hcl] Anaphylaxis  . Hydromorphone Shortness Of Breath  . Ivp Dye [Iodinated Diagnostic  Agents] Anaphylaxis  . Shellfish Allergy Shortness Of Breath and Itching    Causes red, patchy areas to form  . Vancomycin Anaphylaxis  . Adhesive [Tape] Other (See Comments)    Dermatitis   . Infed [Iron Dextran] Itching  . Sulfa Antibiotics Hives  . Iopamidol Itching and Rash   History   Social History  . Marital Status: Single  Spouse Name: N/A    Number of Children: N/A  . Years of Education: N/A   Occupational History  . Not on file.   Social History Main Topics  . Smoking status: Never Smoker   . Smokeless tobacco: Never Used  . Alcohol Use: No  . Drug Use: No  . Sexual Activity: Not on file   Other Topics Concern  . Not on file   Social History Narrative  . No narrative on file       Review of Systems  All other systems reviewed and are negative.       Objective:   Physical Exam  Vitals reviewed. Constitutional: She is oriented to person, place, and time.  Neck: Neck supple. No JVD present. No thyromegaly present.  Cardiovascular: Normal rate and regular rhythm.  Exam reveals no friction rub.   Murmur heard. Pulmonary/Chest: Effort normal and breath sounds normal. No respiratory distress. She has no wheezes. She has no rales. She exhibits no tenderness.  Abdominal: Soft. Bowel sounds are normal. She exhibits no distension and no mass. There is no tenderness. There is no rebound and no guarding.  Musculoskeletal: She exhibits no edema.  Lymphadenopathy:    She has no cervical adenopathy.  Neurological: She is alert and oriented to person, place, and time.  Psychiatric: Her mood appears anxious.   EKG shows normal sinus rhythm at 90 beats per minute with a left axis deviation of -10. There is no ST segment elevation or PR depression consistent with pericarditis.        Assessment & Plan:  1. Pleurisy At this point I believe we have successfully ruled out other dangerous causes of pleurisy. She's had a VQ scan to rule out pulmonary embolism.  She's had a chest x-ray that was out pneumonia, pleural effusion, and malignancy. She's had an EKG which rules out pericarditis. Furthermore her exam is not consistent with pericarditis. The I believe her pain is due to the idiopathic inflammation of the pleura. Start with a prednisone taper and recheck the patient in one week. If her pain improves or resolves no further workup is necessary. If her pain continues I would next consider imaging of her kidneys with a renal ultrasound. - EKG 12-Lead - predniSONE (DELTASONE) 20 MG tablet; 3 tabs poqday 1-2, 2 tabs poqday 3-4, 1 poqday 5-6  Dispense: 12 tablet; Refill: 0

## 2013-09-25 NOTE — Op Note (Signed)
[facility] [place], [address]   OPERATIVE PROCEDURE REPORT  PATIENT: Erin, [name]  MR#: [id number] BIRTHDATE: 1976/07/18  GENDER: Female ENDOSCOPIST: Carol Ada, MD ASSISTANT:   Cherylynn Ridges, technician and Ward Chatters, RN, BSN PROCEDURE DATE: 09/25/2013 PROCEDURE:   EGD, diagnostic ASA CLASS:   Class III INDICATIONS:History of esophageal reflux. MEDICATIONS: MAC sedation, administered by CRNA TOPICAL ANESTHETIC:   Cetacaine Spray  DESCRIPTION OF PROCEDURE:   After the risks benefits and alternatives of the procedure were thoroughly explained, informed consent was obtained.  The [place] [id number]  endoscope was introduced through the mouth  and advanced to the second portion of the duodenum Without limitations.      The instrument was slowly withdrawn as the mucosa was fully examined.      FINDINGS: A 3 cm sliding hiatal hernia was identified.  No other abnormalities were noted int he upper GI tract, i.e., polyps, masses, inflammation, ulcerations, erosions, or vascular abnormalities.          The scope was then withdrawn from the patient and the procedure terminated.  COMPLICATIONS: There were no complications. IMPRESSION: 1) Sliding hiatal hernia.  RECOMMENDATIONS: 1) Continue with present medications.  _______________________________ Lorrin MaisCarol Ada, MD 09/25/2013 1:29 PM

## 2013-09-25 NOTE — Anesthesia Postprocedure Evaluation (Signed)
Anesthesia Post Note  Patient: [name]  Procedure(s) Performed: Procedure(s) (LRB): ESOPHAGOGASTRODUODENOSCOPY (EGD) (N/A) COLONOSCOPY (N/A)  Anesthesia type: MAC  Patient location: PACU  Post pain: Pain level controlled  Post assessment: Post-op Vital signs reviewed  Last Vitals: BP 133/43  Temp(Src) 36.9 C (Oral)  Resp 12  Ht 5\' 3"  (1.6 m)  Wt 258 lb (117.028 kg)  BMI 45.71 kg/m2  SpO2 96%  LMP 01/30/2012  Post vital signs: Reviewed  Level of consciousness: awake  Complications: No apparent anesthesia complications

## 2013-09-25 NOTE — Op Note (Signed)
[facility] [place], [address]   COLONOSCOPY PROCEDURE REPORT  PATIENT: Erin, [name]  MR#: [id number] BIRTHDATE: 15-Feb-1976 , 37  yrs. old GENDER: Female ENDOSCOPIST: Carol Ada, MD REFERRED BY: PROCEDURE DATE:  09/25/2013 PROCEDURE:   Colonoscopy with snare polypectomy ASA CLASS:   Class III INDICATIONS:S/p Renal transplant. MEDICATIONS: MAC sedation, administered by CRNA  DESCRIPTION OF PROCEDURE:   After the risks benefits and alternatives of the procedure were thoroughly explained, informed consent was obtained.  A digital rectal exam revealed no abnormalities of the rectum.   The     endoscope was introduced through the anus and advanced to the cecum, which was identified by both the appendix and ileocecal valve. No adverse events experienced.   The quality of the prep was good.  The instrument was then slowly withdrawn as the colon was fully examined.    FINDINGS: The colonoscopy was difficult to perform as she is resistant to sedation.  Additionally the patient required a supine position to allow for cecal intubation.  A ? polyp was identified in the transverse colon, which was removed with a cold sanre.  A few scattered left sided diverticula were found.  No other abnormalities noted.  Retroflexed views revealed internal/external hemorrhoids. The time to cecum=  .  Withdrawal time=  .  The scope was withdrawn and the procedure completed. COMPLICATIONS: There were no complications.  ENDOSCOPIC IMPRESSION: 1) ? Transverse colon polyp. 2) Diverticula. 3) Hemorrhoids.  RECOMMENDATIONS: 1) Await biopsy results. 2) Repeat the colonoscopy in 2 years per renal transplant protocol.  eSigned:  Carol Ada, MD 09/25/2013 1:26 PM   cc:

## 2013-09-25 NOTE — Anesthesia Preprocedure Evaluation (Addendum)
Anesthesia Evaluation  Patient identified by MRN, date of birth, ID band Patient awake    Reviewed: Allergy & Precautions, H&P , NPO status , Patient's Chart, lab work & pertinent test results  History of Anesthesia Complications (+) PONV  Airway Mallampati: II TM Distance: >3 FB Neck ROM: full    Dental  (+) Teeth Intact and Dental Advisory Given   Pulmonary asthma (Mostly seasonal; spring, breating wel, clear lungs, takes steroids for imunosuppression, not Asthma) , sleep apnea ,  breath sounds clear to auscultation        Cardiovascular hypertension, Pt. on medications negative cardio ROS  Rhythm:regular Rate:Normal     Neuro/Psych PSYCHIATRIC DISORDERS Anxiety Depression negative neurological ROS     GI/Hepatic negative GI ROS, Neg liver ROS,   Endo/Other  diabetes, Type 2, Oral Hypoglycemic AgentsHypothyroidism Morbid obesityWell controlled, am bs 121,   Renal/GU Renal disease     Musculoskeletal negative musculoskeletal ROS (+)   Abdominal   Peds  Hematology negative hematology ROS (+)   Anesthesia Other Findings       Reproductive/Obstetrics negative OB ROS                          Anesthesia Physical  Anesthesia Plan  ASA: III  Anesthesia Plan: MAC   Post-op Pain Management:    Induction:   Airway Management Planned:   Additional Equipment:   Intra-op Plan:   Post-operative Plan:   Informed Consent: I have reviewed the patients History and Physical, chart, labs and discussed the procedure including the risks, benefits and alternatives for the proposed anesthesia with the patient or authorized representative who has indicated his/her understanding and acceptance.     Plan Discussed with: CRNA  Anesthesia Plan Comments:         Anesthesia Quick Evaluation

## 2013-09-25 NOTE — Transfer of Care (Signed)
Immediate Anesthesia Transfer of Care Note  Patient: [name]  Procedure(s) Performed: Procedure(s) (LRB): ESOPHAGOGASTRODUODENOSCOPY (EGD) (N/A) COLONOSCOPY (N/A)  Patient Location: PACU  Anesthesia Type: MAC  Level of Consciousness: sedated, patient cooperative and responds to stimulation  Airway & Oxygen Therapy: Patient Spontanous Breathing and Patient connected to face mask oxgen  Post-op Assessment: Report given to PACU RN and Post -op Vital signs reviewed and stable  Post vital signs: Reviewed and stable  Complications: No apparent anesthesia complications

## 2013-09-25 NOTE — Interval H&P Note (Signed)
History and Physical Interval Note:  09/25/2013 12:43 PM  [name]  has presented today for surgery, with the diagnosis of personal history colon polyps/GERD  The various methods of treatment have been discussed with the patient and family. After consideration of risks, benefits and other options for treatment, the patient has consented to  Procedure(s): ESOPHAGOGASTRODUODENOSCOPY (EGD) (N/A) COLONOSCOPY (N/A) as a surgical intervention .  The patient's history has been reviewed, patient examined, no change in status, stable for surgery.  I have reviewed the patient's chart and labs.  Questions were answered to the patient's satisfaction.     Arelis Neumeier D

## 2013-09-25 NOTE — Preoperative (Signed)
Beta Blockers   Reason not to administer Beta Blockers:Not Applicable 

## 2013-09-28 ENCOUNTER — Encounter (HOSPITAL_COMMUNITY): Payer: Self-pay | Admitting: Gastroenterology

## 2013-10-05 ENCOUNTER — Ambulatory Visit (INDEPENDENT_AMBULATORY_CARE_PROVIDER_SITE_OTHER): Payer: Medicare HMO | Admitting: Family Medicine

## 2013-10-05 ENCOUNTER — Encounter: Payer: Self-pay | Admitting: Family Medicine

## 2013-10-05 VITALS — BP 128/74 | HR 72 | Temp 97.8°F | Resp 18 | Ht 63.0 in | Wt 259.0 lb

## 2013-10-05 DIAGNOSIS — IMO0001 Reserved for inherently not codable concepts without codable children: Secondary | ICD-10-CM

## 2013-10-05 DIAGNOSIS — E669 Obesity, unspecified: Secondary | ICD-10-CM

## 2013-10-05 DIAGNOSIS — R091 Pleurisy: Secondary | ICD-10-CM

## 2013-10-05 DIAGNOSIS — Z23 Encounter for immunization: Secondary | ICD-10-CM

## 2013-10-05 MED ORDER — OXYCODONE HCL 10 MG PO TABS: 10.0000 mg | ORAL_TABLET | Freq: Three times a day (TID) | ORAL | Status: DC | PRN

## 2013-10-05 MED ORDER — TOPIRAMATE 25 MG PO TABS: 100.0000 mg | ORAL_TABLET | Freq: Every day | ORAL | Status: DC

## 2013-10-05 MED ORDER — CYCLOBENZAPRINE HCL 10 MG PO TABS: 10.0000 mg | ORAL_TABLET | Freq: Three times a day (TID) | ORAL | Status: DC | PRN

## 2013-10-05 NOTE — Progress Notes (Signed)
Subjective:    Patient ID: [name], female    DOB: 14-Sep-1976, 37 y.o.   MRN: [id number]  HPI  Patient is here today to followup her pleurisy. Please see the last 2 office visits..  patient took prednisone the pain improved. She continues to have some mild pleurisy mainly in her lower back. It gets better when she is moving. He gets worse after standing for a long time or sitting for a long time.  Her nephrologist is ordering an MRI of her kidneys to rule out masses on the kidneys. Her urinalysis at her father's office was negative for any urinary tract infection. Present time she is requesting a refill on her oxycodone and something for muscle spasms. She agrees that she thinks is likely lower back muscle strain is causing her symptoms at this point. She presents today with her blood sugars. Presently she is on Lantus 45 units subcutaneous daily and NovoLog 15 units 3 times a day with meals. Her fasting blood sugars are less than 130. Her two-hour postprandial sugars are generally less than 160. She's not having hypoglycemic episodes. She is also interested in discussing medication for weight loss. Past Medical History  Diagnosis Date  . Chronic anemia   . Panic disorder   . Crohn's disease   . Anxiety     OCD  . 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 Fracture 05/01/2013    Right foot, in cast  . Hypothyroidism     parathroid runs low  . History of blood transfusion 2 years ago  . Cancer 2001    renal cell , breast cancer both breast 2011, cervical cancer 2013  . Renal disorder   . Complication of anesthesia     hard to sedate dr hung aware  . PONV (postoperative nausea and vomiting)    Past Surgical History  Procedure Laterality Date  . Nephrectomy transplanted organ  1999 and 2011  . Parathyroidectomy  02-20-2001  . Knee surgery Left april 2003    arthroscopy  . Cystoscopy  02/13/2012    Procedure: CYSTOSCOPY;  Surgeon: Franchot Gallo, MD;  Location: [facility];  Service:  Urology;  Laterality: N/A;  insertion of ureteral catheter , removed per Dr Diona Fanti   . Tubal ligation  02-19-2000  . Abdominal hysterectomy  02/13/2012    Procedure: HYSTERECTOMY ABDOMINAL;  Surgeon: Cyril Mourning, MD;  Location: [facility];  Service: Gynecology;  Laterality: N/A;  . Breast lumpectomy and reconstruction Bilateral 2011  . Esophageal manometry N/A 08/10/2013    Procedure: ESOPHAGEAL MANOMETRY (EM);  Surgeon: Beryle Beams, MD;  Location: [facility];  Service: Endoscopy;  Laterality: N/A;  . Incision and drainage abscess N/A 08/26/2013    Procedure: INCISION AND DRAINAGE PERIRECTAL ABSCESS;  Surgeon: Jamesetta So, MD;  Location: [facility];  Service: General;  Laterality: N/A;  . Esophagogastroduodenoscopy N/A 09/25/2013    Procedure: ESOPHAGOGASTRODUODENOSCOPY (EGD);  Surgeon: Beryle Beams, MD;  Location: [facility];  Service: Endoscopy;  Laterality: N/A;  . Colonoscopy N/A 09/25/2013    Procedure: COLONOSCOPY;  Surgeon: Beryle Beams, MD;  Location: [facility];  Service: Endoscopy;  Laterality: N/A;   Current Outpatient Prescriptions on File Prior to Visit  Medication Sig Dispense Refill  . atorvastatin (LIPITOR) 20 MG tablet Take 20 mg by mouth at bedtime.      . busPIRone (BUSPAR) 15 MG tablet Take 15 mg by mouth 2 (two) times daily.       . calcitRIOL (  ROCALTROL) 0.25 MCG capsule Take 1.75 mcg by mouth every morning.      . calcium carbonate (OS-CAL - DOSED IN MG OF ELEMENTAL CALCIUM) 1250 MG tablet Take 1 tablet by mouth 3 (three) times daily.      . cetirizine (ZYRTEC) 10 MG tablet Take 10 mg by mouth every evening.      . cilostazol (PLETAL) 50 MG tablet Take 50 mg by mouth 2 (two) times daily.      Marland Kitchen FLUoxetine (PROZAC) 20 MG capsule Take 60 mg by mouth every morning.      Marland Kitchen HYDROcodone-acetaminophen (NORCO/VICODIN) 5-325 MG per tablet Take 2 tablets by mouth every 6 (six) hours as needed for pain.  30 tablet  0  . hydrOXYzine (ATARAX/VISTARIL) 25 MG tablet Take 50  mg by mouth at bedtime.       . insulin aspart (NOVOLOG) 100 UNIT/ML injection Inject 15 Units into the skin 3 (three) times daily before meals.  1 vial  12  . insulin glargine (LANTUS) 100 UNIT/ML injection Inject 45 Units into the skin at bedtime.       . Linaclotide (LINZESS) 145 MCG CAPS Take 145 mcg by mouth daily as needed (pain).       Marland Kitchen metoCLOPramide (REGLAN) 5 MG tablet Take 5 mg by mouth 3 (three) times daily. Frequency unspecified      . mycophenolate (MYFORTIC) 180 MG EC tablet Take 540 mg by mouth 2 (two) times daily.      Marland Kitchen NOVOLOG FLEXPEN 100 UNIT/ML SOPN FlexPen INJECT 18 UNITS INTO THE SKIN THREE TIMES DAILY BEFORE MEALS.  15 mL  5  . omeprazole (PRILOSEC) 40 MG capsule Take 40 mg by mouth 2 (two) times daily.       Marland Kitchen oxyCODONE-acetaminophen (PERCOCET/ROXICET) 5-325 MG per tablet Take 1 tablet by mouth every 4 (four) hours as needed for pain.  15 tablet  0  . predniSONE (DELTASONE) 10 MG tablet Take 10 mg by mouth daily.      . predniSONE (DELTASONE) 20 MG tablet 3 tabs poqday 1-2, 2 tabs poqday 3-4, 1 poqday 5-6  12 tablet  0  . tacrolimus (PROGRAF) 0.5 MG capsule Take 3.5 mg by mouth 2 (two) times daily. Take with 1mg  capsules for total of 3.5mg  total dose twice daily      . tacrolimus (PROGRAF) 1 MG capsule Take 3.5 mg by mouth 2 (two) times daily. Pt takes a total of 3.5 mg      . tiotropium (SPIRIVA) 18 MCG inhalation capsule Place 18 mcg into inhaler and inhale daily.       No current facility-administered medications on file prior to visit.   Allergies  Allergen Reactions  . Barium-Containing Compounds Shortness Of Breath  . Dilaudid [Hydromorphone Hcl] Anaphylaxis  . Hydromorphone Shortness Of Breath  . Ivp Dye [Iodinated Diagnostic Agents] Anaphylaxis  . Shellfish Allergy Shortness Of Breath and Itching    Causes red, patchy areas to form  . Vancomycin Anaphylaxis  . Adhesive [Tape] Other (See Comments)    Dermatitis   . Infed [Iron Dextran] Itching  . Sulfa  Antibiotics Hives  . Iopamidol Itching and Rash   History   Social History  . Marital Status: Single    Spouse Name: N/A    Number of Children: N/A  . Years of Education: N/A   Occupational History  . Not on file.   Social History Main Topics  . Smoking status: Never Smoker   . Smokeless tobacco: Never Used  .  Alcohol Use: No  . Drug Use: No  . Sexual Activity: Not on file   Other Topics Concern  . Not on file   Social History Narrative  . No narrative on file    Review of Systems  All other systems reviewed and are negative.       Objective:   Physical Exam  Vitals reviewed. Cardiovascular: Normal rate, regular rhythm and normal heart sounds.   Pulmonary/Chest: Effort normal and breath sounds normal. No respiratory distress. She has no wheezes. She has no rales.  Abdominal: Soft. Bowel sounds are normal. She exhibits no distension. There is no tenderness. There is no rebound.          Assessment & Plan:  1. Pleurisy This is likely a muscle strain in her lower back. Gave her Flexeril 10 mg by mouth every 8 hours as needed for muscle spasms. I also recommended the patient followup with her nephrologist an MRI of her kidneys to visit possible causes of her Posey at the present time. She is scheduled to have MRI of her kidneys performed the first week in November - cyclobenzaprine (FLEXERIL) 10 MG tablet; Take 1 tablet (10 mg total) by mouth 3 (three) times daily as needed for muscle spasms.  Dispense: 60 tablet; Refill: 0  2. Obesity, unspecified We had a long discussion about orlistat, Topamax, adipex, and belviq.  After weighing the risk and benefits all his medicines, the patient elects to try Topamax and gradually increase her tablets to 50 mg by mouth twice a day recheck in one month..  I also encouraged healthy therapeutic lifestyle changes, diet, and exercise the - topiramate (TOPAMAX) 25 MG tablet; Take 4 tablets (100 mg total) by mouth daily.  Dispense: 120  tablet; Refill: 2  3. Type II or unspecified type diabetes mellitus without mention of complication, uncontrolled Blood sugars appear excellent I will check a hemoglobin A1c. - Hemoglobin A1c - COMPLETE METABOLIC PANEL WITH GFR - CBC with Differential  4. Need for prophylactic vaccination and inoculation against influenza Patient was given a flu shot. - Flu Vaccine QUAD 36+ mos IM

## 2013-10-06 LAB — CBC WITH DIFFERENTIAL/PLATELET
Basophils Absolute: 0 10*3/uL (ref 0.0–0.1)
Basophils Relative: 0 % (ref 0–1)
Eosinophils Absolute: 0.1 10*3/uL (ref 0.0–0.7)
Eosinophils Relative: 2 % (ref 0–5)
HCT: 39.1 % (ref 36.0–46.0)
Hemoglobin: 12.5 g/dL (ref 12.0–15.0)
Lymphocytes Relative: 29 % (ref 12–46)
Lymphs Abs: 1.9 10*3/uL (ref 0.7–4.0)
MCH: 26.2 pg (ref 26.0–34.0)
MCHC: 32 g/dL (ref 30.0–36.0)
MCV: 81.8 fL (ref 78.0–100.0)
Monocytes Absolute: 0.6 10*3/uL (ref 0.1–1.0)
Monocytes Relative: 9 % (ref 3–12)
Neutro Abs: 3.9 10*3/uL (ref 1.7–7.7)
Neutrophils Relative %: 60 % (ref 43–77)
Platelets: 342 10*3/uL (ref 150–400)
RBC: 4.78 MIL/uL (ref 3.87–5.11)
RDW: 15.2 % (ref 11.5–15.5)
WBC: 6.5 10*3/uL (ref 4.0–10.5)

## 2013-10-06 LAB — COMPLETE METABOLIC PANEL WITH GFR
ALT: <8 U/L (ref 0–35)
AST: 10 U/L (ref 0–37)
Albumin: 4.1 g/dL (ref 3.5–5.2)
Alkaline Phosphatase: 76 U/L (ref 39–117)
BUN: 17 mg/dL (ref 6–23)
CO2: 20 mEq/L (ref 19–32)
Calcium: 7.7 mg/dL — ABNORMAL LOW (ref 8.4–10.5)
Chloride: 102 mEq/L (ref 96–112)
Creat: 1.57 mg/dL — ABNORMAL HIGH (ref 0.50–1.10)
GFR, Est African American: 48 mL/min — ABNORMAL LOW
GFR, Est Non African American: 41 mL/min — ABNORMAL LOW
Glucose, Bld: 129 mg/dL — ABNORMAL HIGH (ref 70–99)
Potassium: 4.4 mEq/L (ref 3.5–5.3)
Sodium: 135 mEq/L (ref 135–145)
Total Bilirubin: 0.4 mg/dL (ref 0.3–1.2)
Total Protein: 7.1 g/dL (ref 6.0–8.3)

## 2013-10-06 LAB — HEMOGLOBIN A1C
Hgb A1c MFr Bld: 8.9 % — ABNORMAL HIGH (ref <5.7)
Mean Plasma Glucose: 209 mg/dL — ABNORMAL HIGH (ref <117)

## 2013-10-09 ENCOUNTER — Encounter (HOSPITAL_COMMUNITY)
Admission: EM | Disposition: A | Discharge status: Home / Self Care | Payer: Self-pay | Source: Home / Self Care | Attending: Emergency Medicine

## 2013-10-09 ENCOUNTER — Emergency Department (HOSPITAL_COMMUNITY)
Admission: EM | Admit: 2013-10-09 | Discharge: 2013-10-09 | Disposition: A | Discharge status: Home / Self Care | Payer: Medicare HMO | Attending: Emergency Medicine | Admitting: Emergency Medicine

## 2013-10-09 ENCOUNTER — Encounter (HOSPITAL_COMMUNITY): Payer: Medicare HMO | Admitting: Anesthesiology

## 2013-10-09 ENCOUNTER — Emergency Department (HOSPITAL_COMMUNITY): Payer: Medicare HMO | Admitting: Anesthesiology

## 2013-10-09 ENCOUNTER — Encounter (HOSPITAL_COMMUNITY): Payer: Self-pay | Admitting: Emergency Medicine

## 2013-10-09 DIAGNOSIS — Z85528 Personal history of other malignant neoplasm of kidney: Secondary | ICD-10-CM | POA: Insufficient documentation

## 2013-10-09 DIAGNOSIS — L678 Other hair color and hair shaft abnormalities: Secondary | ICD-10-CM | POA: Insufficient documentation

## 2013-10-09 DIAGNOSIS — I10 Essential (primary) hypertension: Secondary | ICD-10-CM | POA: Insufficient documentation

## 2013-10-09 DIAGNOSIS — L03319 Cellulitis of trunk, unspecified: Secondary | ICD-10-CM

## 2013-10-09 DIAGNOSIS — F410 Panic disorder [episodic paroxysmal anxiety] without agoraphobia: Secondary | ICD-10-CM | POA: Insufficient documentation

## 2013-10-09 DIAGNOSIS — L738 Other specified follicular disorders: Secondary | ICD-10-CM | POA: Insufficient documentation

## 2013-10-09 DIAGNOSIS — L02219 Cutaneous abscess of trunk, unspecified: Secondary | ICD-10-CM

## 2013-10-09 DIAGNOSIS — F429 Obsessive-compulsive disorder, unspecified: Secondary | ICD-10-CM | POA: Insufficient documentation

## 2013-10-09 DIAGNOSIS — Z6841 Body Mass Index (BMI) 40.0 and over, adult: Secondary | ICD-10-CM | POA: Insufficient documentation

## 2013-10-09 DIAGNOSIS — Z940 Kidney transplant status: Secondary | ICD-10-CM | POA: Insufficient documentation

## 2013-10-09 DIAGNOSIS — Z8541 Personal history of malignant neoplasm of cervix uteri: Secondary | ICD-10-CM | POA: Insufficient documentation

## 2013-10-09 DIAGNOSIS — Z79899 Other long term (current) drug therapy: Secondary | ICD-10-CM | POA: Insufficient documentation

## 2013-10-09 DIAGNOSIS — F411 Generalized anxiety disorder: Secondary | ICD-10-CM | POA: Insufficient documentation

## 2013-10-09 DIAGNOSIS — Z853 Personal history of malignant neoplasm of breast: Secondary | ICD-10-CM | POA: Insufficient documentation

## 2013-10-09 DIAGNOSIS — L02214 Cutaneous abscess of groin: Secondary | ICD-10-CM

## 2013-10-09 DIAGNOSIS — Z794 Long term (current) use of insulin: Secondary | ICD-10-CM | POA: Insufficient documentation

## 2013-10-09 DIAGNOSIS — K5090 Crohn's disease, unspecified, without complications: Secondary | ICD-10-CM | POA: Insufficient documentation

## 2013-10-09 DIAGNOSIS — E039 Hypothyroidism, unspecified: Secondary | ICD-10-CM | POA: Insufficient documentation

## 2013-10-09 DIAGNOSIS — E119 Type 2 diabetes mellitus without complications: Secondary | ICD-10-CM | POA: Insufficient documentation

## 2013-10-09 HISTORY — PX: GROIN DISSECTION: SHX5250

## 2013-10-09 LAB — CBC WITH DIFFERENTIAL/PLATELET
Basophils Absolute: 0 10*3/uL (ref 0.0–0.1)
Basophils Relative: 0 % (ref 0–1)
Eosinophils Absolute: 0.1 10*3/uL (ref 0.0–0.7)
Eosinophils Relative: 2 % (ref 0–5)
HCT: 42.7 % (ref 36.0–46.0)
Hemoglobin: 13.8 g/dL (ref 12.0–15.0)
Lymphocytes Relative: 25 % (ref 12–46)
Lymphs Abs: 1.5 10*3/uL (ref 0.7–4.0)
MCH: 26.5 pg (ref 26.0–34.0)
MCHC: 32.3 g/dL (ref 30.0–36.0)
MCV: 82.1 fL (ref 78.0–100.0)
Monocytes Absolute: 0.5 10*3/uL (ref 0.1–1.0)
Monocytes Relative: 8 % (ref 3–12)
Neutro Abs: 4 10*3/uL (ref 1.7–7.7)
Neutrophils Relative %: 65 % (ref 43–77)
Platelets: 310 10*3/uL (ref 150–400)
RBC: 5.2 MIL/uL — ABNORMAL HIGH (ref 3.87–5.11)
RDW: 14.1 % (ref 11.5–15.5)
WBC: 6.1 10*3/uL (ref 4.0–10.5)

## 2013-10-09 LAB — COMPREHENSIVE METABOLIC PANEL
ALT: 8 U/L (ref 0–35)
AST: 11 U/L (ref 0–37)
Albumin: 3.9 g/dL (ref 3.5–5.2)
Alkaline Phosphatase: 86 U/L (ref 39–117)
BUN: 23 mg/dL (ref 6–23)
CO2: 22 mEq/L (ref 19–32)
Calcium: 7.8 mg/dL — ABNORMAL LOW (ref 8.4–10.5)
Chloride: 100 mEq/L (ref 96–112)
Creatinine, Ser: 1.57 mg/dL — ABNORMAL HIGH (ref 0.50–1.10)
GFR calc Af Amer: 47 mL/min — ABNORMAL LOW (ref >90)
GFR calc non Af Amer: 41 mL/min — ABNORMAL LOW (ref >90)
Glucose, Bld: 138 mg/dL — ABNORMAL HIGH (ref 70–99)
Potassium: 4.1 mEq/L (ref 3.5–5.1)
Sodium: 134 mEq/L — ABNORMAL LOW (ref 135–145)
Total Bilirubin: 0.3 mg/dL (ref 0.3–1.2)
Total Protein: 7.9 g/dL (ref 6.0–8.3)

## 2013-10-09 SURGERY — EXPLORATION, INGUINAL REGION
Anesthesia: Monitor Anesthesia Care | Site: Groin | Laterality: Left | Wound class: Contaminated

## 2013-10-09 MED ORDER — KETAMINE HCL 50 MG/ML IJ SOLN
INTRAMUSCULAR | Status: DC | PRN
  Administered 2013-10-09 (×3): 25 mg via INTRAMUSCULAR

## 2013-10-09 MED ORDER — HYDROCODONE-ACETAMINOPHEN 5-325 MG PO TABS: 1.0000 | ORAL_TABLET | Freq: Four times a day (QID) | ORAL | Status: DC | PRN

## 2013-10-09 MED ORDER — LIDOCAINE-EPINEPHRINE (PF) 1 %-1:200000 IJ SOLN
INTRAMUSCULAR | Status: DC | PRN
  Administered 2013-10-09: 4.5 mL

## 2013-10-09 MED ORDER — BUPIVACAINE HCL (PF) 0.25 % IJ SOLN
INTRAMUSCULAR | Status: DC | PRN
  Administered 2013-10-09: 4.5 mL

## 2013-10-09 MED ORDER — AMOXICILLIN-POT CLAVULANATE 875-125 MG PO TABS: 1.0000 | ORAL_TABLET | Freq: Two times a day (BID) | ORAL | Status: DC

## 2013-10-09 MED ORDER — PROMETHAZINE HCL 25 MG/ML IJ SOLN: 6.2500 mg | INTRAMUSCULAR | Status: DC | PRN

## 2013-10-09 MED ORDER — HYDROCODONE-ACETAMINOPHEN 5-325 MG PO TABS
1.0000 | ORAL_TABLET | Freq: Four times a day (QID) | ORAL | Status: DC | PRN
  Administered 2013-10-09: 2 via ORAL
  Filled 2013-10-09: qty 2

## 2013-10-09 MED ORDER — PROPOFOL INFUSION 10 MG/ML OPTIME
INTRAVENOUS | Status: DC | PRN
  Administered 2013-10-09: 50 ug/kg/min via INTRAVENOUS

## 2013-10-09 MED ORDER — SODIUM CHLORIDE 0.9 % IR SOLN
Status: DC | PRN
  Administered 2013-10-09: 1000 mL

## 2013-10-09 MED ORDER — DIPHENHYDRAMINE HCL 50 MG/ML IJ SOLN
INTRAMUSCULAR | Status: DC | PRN
  Administered 2013-10-09 (×4): 25 mg via INTRAVENOUS

## 2013-10-09 MED ORDER — HYDROCORTISONE SOD SUCCINATE 100 MG IJ SOLR
INTRAMUSCULAR | Status: DC | PRN
  Administered 2013-10-09: 25 mg via INTRAVENOUS

## 2013-10-09 MED ORDER — FENTANYL CITRATE 0.05 MG/ML IJ SOLN
INTRAMUSCULAR | Status: DC | PRN
  Administered 2013-10-09: 100 ug via INTRAVENOUS

## 2013-10-09 MED ORDER — LIDOCAINE-EPINEPHRINE 1 %-1:100000 IJ SOLN
INTRAMUSCULAR | Status: AC
  Filled 2013-10-09: qty 1

## 2013-10-09 MED ORDER — CEFAZOLIN SODIUM-DEXTROSE 2-3 GM-% IV SOLR
2.0000 g | Freq: Once | INTRAVENOUS | Status: AC
  Administered 2013-10-09: 2 g via INTRAVENOUS

## 2013-10-09 MED ORDER — HYDROCORTISONE SOD SUCCINATE 100 MG IJ SOLR
INTRAMUSCULAR | Status: AC
  Filled 2013-10-09: qty 2

## 2013-10-09 MED ORDER — FENTANYL CITRATE 0.05 MG/ML IJ SOLN: 25.0000 ug | INTRAMUSCULAR | Status: DC | PRN

## 2013-10-09 MED ORDER — SODIUM CHLORIDE 0.9 % IV SOLN
INTRAVENOUS | Status: DC
  Administered 2013-10-09 (×2): via INTRAVENOUS

## 2013-10-09 MED ORDER — MIDAZOLAM HCL 5 MG/5ML IJ SOLN
INTRAMUSCULAR | Status: DC | PRN
  Administered 2013-10-09: 2 mg via INTRAVENOUS

## 2013-10-09 MED ORDER — BUPIVACAINE HCL (PF) 0.25 % IJ SOLN
INTRAMUSCULAR | Status: AC
  Filled 2013-10-09: qty 30

## 2013-10-09 MED ORDER — CEFAZOLIN SODIUM-DEXTROSE 2-3 GM-% IV SOLR
INTRAVENOUS | Status: AC
  Filled 2013-10-09: qty 50

## 2013-10-09 SURGICAL SUPPLY — 35 items
APL SKNCLS STERI-STRIP NONHPOA (GAUZE/BANDAGES/DRESSINGS)
BENZOIN TINCTURE PRP APPL 2/3 (GAUZE/BANDAGES/DRESSINGS) IMPLANT
BLADE HEX COATED 2.75 (ELECTRODE) ×1 IMPLANT
BLADE SURG SZ10 CARB STEEL (BLADE) ×3 IMPLANT
CANISTER SUCTION 2500CC (MISCELLANEOUS) ×2 IMPLANT
CLOTH BEACON ORANGE TIMEOUT ST (SAFETY) ×2 IMPLANT
DECANTER SPIKE VIAL GLASS SM (MISCELLANEOUS) IMPLANT
DRAPE LAPAROTOMY T 102X78X121 (DRAPES) IMPLANT
DRAPE LAPAROTOMY TRNSV 102X78 (DRAPE) IMPLANT
DRAPE LG THREE QUARTER DISP (DRAPES) IMPLANT
ELECT REM PT RETURN 9FT ADLT (ELECTROSURGICAL) ×2
ELECTRODE REM PT RTRN 9FT ADLT (ELECTROSURGICAL) ×1 IMPLANT
EVACUATOR SILICONE 100CC (DRAIN) IMPLANT
GLOVE BIOGEL PI IND STRL 7.0 (GLOVE) ×1 IMPLANT
GLOVE BIOGEL PI INDICATOR 7.0 (GLOVE) ×1
GLOVE SURG SIGNA 7.5 PF LTX (GLOVE) ×2 IMPLANT
GOWN PREVENTION PLUS LG XLONG (DISPOSABLE) ×2 IMPLANT
GOWN STRL REIN XL XLG (GOWN DISPOSABLE) ×4 IMPLANT
KIT BASIN OR (CUSTOM PROCEDURE TRAY) ×2 IMPLANT
MARKER SKIN DUAL TIP RULER LAB (MISCELLANEOUS) IMPLANT
NDL HYPO 25X1 1.5 SAFETY (NEEDLE) ×1 IMPLANT
NEEDLE HYPO 25X1 1.5 SAFETY (NEEDLE) ×2 IMPLANT
NS IRRIG 1000ML POUR BTL (IV SOLUTION) ×2 IMPLANT
PACK BASIC VI WITH GOWN DISP (CUSTOM PROCEDURE TRAY) ×2 IMPLANT
PENCIL BUTTON HOLSTER BLD 10FT (ELECTRODE) ×2 IMPLANT
SOL PREP POV-IOD 16OZ 10% (MISCELLANEOUS) ×2 IMPLANT
SPONGE GAUZE 4X4 12PLY (GAUZE/BANDAGES/DRESSINGS) ×2 IMPLANT
SPONGE LAP 4X18 X RAY DECT (DISPOSABLE) ×3 IMPLANT
STAPLER VISISTAT 35W (STAPLE) IMPLANT
STRIP CLOSURE SKIN 1/2X4 (GAUZE/BANDAGES/DRESSINGS) IMPLANT
SYR CONTROL 10ML LL (SYRINGE) ×2 IMPLANT
TAPE CLOTH SURG 4X10 WHT LF (GAUZE/BANDAGES/DRESSINGS) ×1 IMPLANT
TOWEL OR 17X26 10 PK STRL BLUE (TOWEL DISPOSABLE) ×2 IMPLANT
WATER STERILE IRR 1500ML POUR (IV SOLUTION) ×2 IMPLANT
YANKAUER SUCT BULB TIP 10FT TU (MISCELLANEOUS) IMPLANT

## 2013-10-09 NOTE — Anesthesia Preprocedure Evaluation (Addendum)
Anesthesia Evaluation  Patient identified by MRN, date of birth, ID band Patient awake    Reviewed: Allergy & Precautions, H&P , NPO status , Patient's Chart, lab work & pertinent test results  History of Anesthesia Complications (+) PONV and history of anesthetic complications  Airway Mallampati: II TM Distance: >3 FB Neck ROM: Full    Dental no notable dental hx.    Pulmonary sleep apnea ,  breath sounds clear to auscultation  Pulmonary exam normal       Cardiovascular hypertension, negative cardio ROS  Rhythm:Regular Rate:Normal     Neuro/Psych PSYCHIATRIC DISORDERS Anxiety Depression negative neurological ROS     GI/Hepatic negative GI ROS, Neg liver ROS,   Endo/Other  diabetes, Type 1, Insulin DependentHypothyroidism Morbid obesity  Renal/GU Renal diseaseS;P renal transplant.  negative genitourinary   Musculoskeletal negative musculoskeletal ROS (+)   Abdominal (+) + obese,   Peds negative pediatric ROS (+)  Hematology negative hematology ROS (+)   Anesthesia Other Findings   Reproductive/Obstetrics negative OB ROS                          Anesthesia Physical Anesthesia Plan  ASA: III  Anesthesia Plan: MAC   Post-op Pain Management:    Induction: Intravenous  Airway Management Planned: Oral ETT  Additional Equipment:   Intra-op Plan:   Post-operative Plan: Extubation in OR  Informed Consent: I have reviewed the patients History and Physical, chart, labs and discussed the procedure including the risks, benefits and alternatives for the proposed anesthesia with the patient or authorized representative who has indicated his/her understanding and acceptance.   Dental advisory given  Plan Discussed with: CRNA  Anesthesia Plan Comments: (MAC should work well per Dr. Lucia Gaskins. Patient consents.)       Anesthesia Quick Evaluation

## 2013-10-09 NOTE — ED Provider Notes (Signed)
CSN: [id number]     Arrival date & time 10/09/13  1008 History   First MD Initiated Contact with Patient 10/09/13 1017     Chief Complaint  Patient presents with  . mass on leg    (Consider location/radiation/quality/duration/timing/severity/associated sxs/prior Treatment) HPI  Past Medical History  Diagnosis Date  . Chronic anemia   . Panic disorder   . Crohn's disease   . Anxiety     OCD  . 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 Fracture 05/01/2013    Right foot, in cast  . Hypothyroidism     parathroid runs low  . History of blood transfusion 2 years ago  . Cancer 2001    renal cell , breast cancer both breast 2011, cervical cancer 2013  . Renal disorder   . Complication of anesthesia     hard to sedate dr hung aware  . PONV (postoperative nausea and vomiting)    Past Surgical History  Procedure Laterality Date  . Nephrectomy transplanted organ  1999 and 2011  . Parathyroidectomy  02-20-2001  . Knee surgery Left april 2003    arthroscopy  . Cystoscopy  02/13/2012    Procedure: CYSTOSCOPY;  Surgeon: Franchot Gallo, MD;  Location: [facility];  Service: Urology;  Laterality: N/A;  insertion of ureteral catheter , removed per Dr Diona Fanti   . Tubal ligation  02-19-2000  . Abdominal hysterectomy  02/13/2012    Procedure: HYSTERECTOMY ABDOMINAL;  Surgeon: Cyril Mourning, MD;  Location: [facility];  Service: Gynecology;  Laterality: N/A;  . Breast lumpectomy and reconstruction Bilateral 2011  . Esophageal manometry N/A 08/10/2013    Procedure: ESOPHAGEAL MANOMETRY (EM);  Surgeon: Beryle Beams, MD;  Location: [facility];  Service: Endoscopy;  Laterality: N/A;  . Incision and drainage abscess N/A 08/26/2013    Procedure: INCISION AND DRAINAGE PERIRECTAL ABSCESS;  Surgeon: Jamesetta So, MD;  Location: [facility];  Service: General;  Laterality: N/A;  . Esophagogastroduodenoscopy N/A 09/25/2013    Procedure: ESOPHAGOGASTRODUODENOSCOPY (EGD);  Surgeon: Beryle Beams, MD;  Location: [facility];  Service: Endoscopy;  Laterality: N/A;  . Colonoscopy N/A 09/25/2013    Procedure: COLONOSCOPY;  Surgeon: Beryle Beams, MD;  Location: [facility];  Service: Endoscopy;  Laterality: N/A;   History reviewed. No pertinent family history. History  Substance Use Topics  . Smoking status: Never Smoker   . Smokeless tobacco: Never Used  . Alcohol Use: No   OB History   Grav Para Term Preterm Abortions TAB SAB Ect Mult Living                 Review of Systems  Allergies  Barium-containing compounds; Dilaudid; Hydromorphone; Ivp dye; Shellfish allergy; Vancomycin; Adhesive; Infed; Sulfa antibiotics; and Iopamidol  Home Medications   Current Outpatient Rx  Name  Route  Sig  Dispense  Refill  . atorvastatin (LIPITOR) 20 MG tablet   Oral   Take 20 mg by mouth at bedtime.         . busPIRone (BUSPAR) 15 MG tablet   Oral   Take 15 mg by mouth 2 (two) times daily.          . calcitRIOL (ROCALTROL) 0.25 MCG capsule   Oral   Take 1.75 mcg by mouth every morning.         . calcium carbonate (OS-CAL - DOSED IN MG OF ELEMENTAL CALCIUM) 1250 MG tablet   Oral   Take 1 tablet by mouth 3 (three)  times daily.         . cetirizine (ZYRTEC) 10 MG tablet   Oral   Take 10 mg by mouth every evening.         . cilostazol (PLETAL) 50 MG tablet   Oral   Take 50 mg by mouth 2 (two) times daily.         . cyclobenzaprine (FLEXERIL) 10 MG tablet   Oral   Take 1 tablet (10 mg total) by mouth 3 (three) times daily as needed for muscle spasms.   60 tablet   0   . FLUoxetine (PROZAC) 20 MG capsule   Oral   Take 60 mg by mouth every morning.         Marland Kitchen HYDROcodone-acetaminophen (NORCO/VICODIN) 5-325 MG per tablet   Oral   Take 2 tablets by mouth every 6 (six) hours as needed for pain.   30 tablet   0   . hydrOXYzine (ATARAX/VISTARIL) 25 MG tablet   Oral   Take 50 mg by mouth at bedtime.          . insulin aspart (NOVOLOG) 100 UNIT/ML injection    Subcutaneous   Inject 15 Units into the skin 3 (three) times daily before meals.   1 vial   12   . insulin glargine (LANTUS) 100 UNIT/ML injection   Subcutaneous   Inject 45 Units into the skin at bedtime.          . Linaclotide (LINZESS) 145 MCG CAPS   Oral   Take 145 mcg by mouth daily as needed (pain).          Marland Kitchen metoCLOPramide (REGLAN) 5 MG tablet   Oral   Take 5 mg by mouth 3 (three) times daily. Frequency unspecified         . mycophenolate (MYFORTIC) 180 MG EC tablet   Oral   Take 540 mg by mouth 2 (two) times daily.         Marland Kitchen NOVOLOG FLEXPEN 100 UNIT/ML SOPN FlexPen      INJECT 18 UNITS INTO THE SKIN THREE TIMES DAILY BEFORE MEALS.   15 mL   5   . omeprazole (PRILOSEC) 40 MG capsule   Oral   Take 40 mg by mouth 2 (two) times daily.          . Oxycodone HCl 10 MG TABS   Oral   Take 1 tablet (10 mg total) by mouth 3 (three) times daily as needed. For pain   90 tablet   0   . oxyCODONE-acetaminophen (PERCOCET/ROXICET) 5-325 MG per tablet   Oral   Take 1 tablet by mouth every 4 (four) hours as needed for pain.   15 tablet   0   . predniSONE (DELTASONE) 10 MG tablet   Oral   Take 10 mg by mouth daily.         . tacrolimus (PROGRAF) 0.5 MG capsule   Oral   Take 3.5 mg by mouth 2 (two) times daily. Take with 1mg  capsules for total of 3.5mg  total dose twice daily         . tacrolimus (PROGRAF) 1 MG capsule   Oral   Take 3.5 mg by mouth 2 (two) times daily. Pt takes a total of 3.5 mg         . tiotropium (SPIRIVA) 18 MCG inhalation capsule   Inhalation   Place 18 mcg into inhaler and inhale daily.         Marland Kitchen topiramate (  TOPAMAX) 25 MG tablet   Oral   Take 4 tablets (100 mg total) by mouth daily.   120 tablet   2    BP 103/77  Pulse 80  Temp(Src) 98.5 F (36.9 C) (Oral)  Resp 18  SpO2 100%  LMP 01/30/2012 Physical Exam  ED Course  Procedures (including critical care time) Labs Review Labs Reviewed  CBC WITH DIFFERENTIAL -  Abnormal; Notable for the following:    RBC 5.20 (*)    All other components within normal limits  COMPREHENSIVE METABOLIC PANEL - Abnormal; Notable for the following:    Sodium 134 (*)    Glucose, Bld 138 (*)    Creatinine, Ser 1.57 (*)    Calcium 7.8 (*)    GFR calc non Af Amer 41 (*)    GFR calc Af Amer 47 (*)    All other components within normal limits  URINALYSIS, ROUTINE W REFLEX MICROSCOPIC   Imaging Review No results found.  EKG Interpretation   None       MDM  No diagnosis found. Duplicate note--please delete    Orlie Dakin, MD 10/10/13 [phone]

## 2013-10-09 NOTE — ED Notes (Signed)
Pt made aware of need for urine 

## 2013-10-09 NOTE — ED Provider Notes (Signed)
CSN: [id number]     Arrival date & time 10/09/13  1008 History   First MD Initiated Contact with Patient 10/09/13 1017     Chief Complaint  Patient presents with  . mass on leg    (Consider location/radiation/quality/duration/timing/severity/associated sxs/prior Treatment) The history is provided by the patient. No language interpreter was used.  [name] is a 37 y/o F with PMHx of chronic anemia, panic disorder, Crohn's disease, DM, blood transfusions, bilateral kidney replacement one in 1999 and the other in 2011, cancer, breast lumpectomy 2011, parathyroidectomy in 2002, presenting to the ED with growth to the left pelvic/groin region that has been ongoing for the past week. As per patient reported that the lesion has gotten progressively larger - reported that the lesions is starting to irritate her in that when she moves she has a pulling sensation and that it irritates her when she walks. Patient reported that she feels the discomfort deep inside the groin region. Reported that she has been having low grade fevers, approximately 99.69F as per patient. Denied chills, chest pain, shortness of breath, difficulty breathing, weakness, drainage from the site, neck pain, neck stiffness, fainting.  PCP Dr. Jenna Luo Nephrologist Dr. Erling Cruz Oncologist Dr. Paulla Dolly - she no longer sees  Past Medical History  Diagnosis Date  . Chronic anemia   . Panic disorder   . Crohn's disease   . Anxiety     OCD  . 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 Fracture 05/01/2013    Right foot, in cast  . Hypothyroidism     parathroid runs low  . History of blood transfusion 2 years ago  . Cancer 2001    renal cell , breast cancer both breast 2011, cervical cancer 2013  . Renal disorder   . Complication of anesthesia     hard to sedate dr hung aware  . PONV (postoperative nausea and vomiting)    Past Surgical History  Procedure Laterality Date  . Nephrectomy transplanted organ  1999  and 2011  . Parathyroidectomy  02-20-2001  . Knee surgery Left april 2003    arthroscopy  . Cystoscopy  02/13/2012    Procedure: CYSTOSCOPY;  Surgeon: Franchot Gallo, MD;  Location: [facility];  Service: Urology;  Laterality: N/A;  insertion of ureteral catheter , removed per Dr Diona Fanti   . Tubal ligation  02-19-2000  . Abdominal hysterectomy  02/13/2012    Procedure: HYSTERECTOMY ABDOMINAL;  Surgeon: Cyril Mourning, MD;  Location: [facility];  Service: Gynecology;  Laterality: N/A;  . Breast lumpectomy and reconstruction Bilateral 2011  . Esophageal manometry N/A 08/10/2013    Procedure: ESOPHAGEAL MANOMETRY (EM);  Surgeon: Beryle Beams, MD;  Location: [facility];  Service: Endoscopy;  Laterality: N/A;  . Incision and drainage abscess N/A 08/26/2013    Procedure: INCISION AND DRAINAGE PERIRECTAL ABSCESS;  Surgeon: Jamesetta So, MD;  Location: [facility];  Service: General;  Laterality: N/A;  . Esophagogastroduodenoscopy N/A 09/25/2013    Procedure: ESOPHAGOGASTRODUODENOSCOPY (EGD);  Surgeon: Beryle Beams, MD;  Location: [facility];  Service: Endoscopy;  Laterality: N/A;  . Colonoscopy N/A 09/25/2013    Procedure: COLONOSCOPY;  Surgeon: Beryle Beams, MD;  Location: [facility];  Service: Endoscopy;  Laterality: N/A;   History reviewed. No pertinent family history. History  Substance Use Topics  . Smoking status: Never Smoker   . Smokeless tobacco: Never Used  . Alcohol Use: No   OB History   Grav Para  Term Preterm Abortions TAB SAB Ect Mult Living                 Review of Systems  Constitutional: Positive for fever.  HENT: Negative for trouble swallowing.   Respiratory: Negative for chest tightness and shortness of breath.   Cardiovascular: Negative for chest pain.  Gastrointestinal: Negative for abdominal pain.  Genitourinary: Negative for decreased urine volume and difficulty urinating.  Musculoskeletal: Negative for neck pain and neck stiffness.  Skin:       Lesion to the  left lower pelvic region   Neurological: Negative for dizziness, weakness and headaches.  All other systems reviewed and are negative.    Allergies  Barium-containing compounds; Dilaudid; Hydromorphone; Ivp dye; Shellfish allergy; Vancomycin; Adhesive; Infed; Sulfa antibiotics; and Iopamidol  Home Medications   Current Outpatient Rx  Name  Route  Sig  Dispense  Refill  . atorvastatin (LIPITOR) 20 MG tablet   Oral   Take 20 mg by mouth at bedtime.         . busPIRone (BUSPAR) 15 MG tablet   Oral   Take 15 mg by mouth 2 (two) times daily.          . calcitRIOL (ROCALTROL) 0.25 MCG capsule   Oral   Take 1.75 mcg by mouth every morning.         . calcium carbonate (OS-CAL - DOSED IN MG OF ELEMENTAL CALCIUM) 1250 MG tablet   Oral   Take 1 tablet by mouth 3 (three) times daily.         . cetirizine (ZYRTEC) 10 MG tablet   Oral   Take 10 mg by mouth every evening.         . cilostazol (PLETAL) 50 MG tablet   Oral   Take 50 mg by mouth 2 (two) times daily.         . cyclobenzaprine (FLEXERIL) 10 MG tablet   Oral   Take 1 tablet (10 mg total) by mouth 3 (three) times daily as needed for muscle spasms.   60 tablet   0   . FLUoxetine (PROZAC) 20 MG capsule   Oral   Take 60 mg by mouth every morning.         Marland Kitchen HYDROcodone-acetaminophen (NORCO/VICODIN) 5-325 MG per tablet   Oral   Take 2 tablets by mouth every 6 (six) hours as needed for pain.   30 tablet   0   . hydrOXYzine (ATARAX/VISTARIL) 25 MG tablet   Oral   Take 50 mg by mouth at bedtime.          . insulin aspart (NOVOLOG) 100 UNIT/ML injection   Subcutaneous   Inject 15 Units into the skin 3 (three) times daily before meals.   1 vial   12   . insulin glargine (LANTUS) 100 UNIT/ML injection   Subcutaneous   Inject 45 Units into the skin at bedtime.          . Linaclotide (LINZESS) 145 MCG CAPS   Oral   Take 145 mcg by mouth daily as needed (pain).          Marland Kitchen metoCLOPramide (REGLAN)  5 MG tablet   Oral   Take 5 mg by mouth 3 (three) times daily. Frequency unspecified         . mycophenolate (MYFORTIC) 180 MG EC tablet   Oral   Take 540 mg by mouth 2 (two) times daily.         Marland Kitchen  NOVOLOG FLEXPEN 100 UNIT/ML SOPN FlexPen      INJECT 18 UNITS INTO THE SKIN THREE TIMES DAILY BEFORE MEALS.   15 mL   5   . omeprazole (PRILOSEC) 40 MG capsule   Oral   Take 40 mg by mouth 2 (two) times daily.          . Oxycodone HCl 10 MG TABS   Oral   Take 1 tablet (10 mg total) by mouth 3 (three) times daily as needed. For pain   90 tablet   0   . oxyCODONE-acetaminophen (PERCOCET/ROXICET) 5-325 MG per tablet   Oral   Take 1 tablet by mouth every 4 (four) hours as needed for pain.   15 tablet   0   . predniSONE (DELTASONE) 10 MG tablet   Oral   Take 10 mg by mouth daily.         . tacrolimus (PROGRAF) 0.5 MG capsule   Oral   Take 3.5 mg by mouth 2 (two) times daily. Take with 1mg  capsules for total of 3.5mg  total dose twice daily         . tacrolimus (PROGRAF) 1 MG capsule   Oral   Take 3.5 mg by mouth 2 (two) times daily. Pt takes a total of 3.5 mg         . tiotropium (SPIRIVA) 18 MCG inhalation capsule   Inhalation   Place 18 mcg into inhaler and inhale daily.         Marland Kitchen topiramate (TOPAMAX) 25 MG tablet   Oral   Take 4 tablets (100 mg total) by mouth daily.   120 tablet   2   . amoxicillin-clavulanate (AUGMENTIN) 875-125 MG per tablet   Oral   Take 1 tablet by mouth 2 (two) times daily.   14 tablet   0   . HYDROcodone-acetaminophen (NORCO/VICODIN) 5-325 MG per tablet   Oral   Take 1-2 tablets by mouth every 6 (six) hours as needed for pain.   30 tablet   0    BP 138/89  Pulse 76  Temp(Src) 98 F (36.7 C) (Oral)  Resp 18  SpO2 96%  LMP 01/30/2012 Physical Exam  Nursing note and vitals reviewed. Constitutional: She is oriented to person, place, and time. She appears well-developed and well-nourished. No distress.  HENT:  Head:  Normocephalic and atraumatic.  Neck: Normal range of motion. Neck supple.  Negative neck stiffness Negative nuchal rigidity Negative cervical lymphadenopathy  Cardiovascular: Normal rate, regular rhythm and normal heart sounds.  Exam reveals no friction rub.   No murmur heard. Pulmonary/Chest: Effort normal and breath sounds normal. No respiratory distress. She has no wheezes. She has no rales.  Genitourinary:  Negative inguinal lymphadenopathy  Musculoskeletal: Normal range of motion.  Lymphadenopathy:    She has no cervical adenopathy.  Neurological: She is alert and oriented to person, place, and time. She exhibits normal muscle tone. Coordination normal.  Strength 5+/5+ to lower extremities bilaterally with resistance applied, equal distribution identified.   Skin: Skin is dry. She is not diaphoretic.  Approximately 2 cm x 2 cm indurated lesion to the left lower pelvic region, just superior to the inguinal region. Discomfort upon palpation. Indurated. Negative mobility. Negative erythema, inflammation, swelling, bleeding, drainage to site.   Psychiatric: She has a normal mood and affect. Her behavior is normal. Thought content normal.    ED Course  [place] bedside Date/Time: 10/09/2013 10:49 AM Performed by: Jamse Mead Authorized by: Jamse Mead Consent: Verbal  consent obtained. written consent not obtained. Risks and benefits: risks, benefits and alternatives were discussed Consent given by: patient Patient understanding: patient states understanding of the procedure being performed Patient consent: the patient's understanding of the procedure matches consent given Patient identity confirmed: verbally with patient and arm band Time out: Immediately prior to procedure a "time out" was called to verify the correct patient, procedure, equipment, support staff and site/side marked as required. Local anesthesia used: no Patient sedated: no Patient tolerance: Patient tolerated  the procedure well with no immediate complications. Comments: Hyperechoic fluid present to lesion on the left pelvic region, just superior to the left inguinal region    (including critical care time)  10:47 AM Spoke with Dr. Winfred Leeds regarding the case and how the patient is insisting on surgery consult. Dr. Winfred Leeds saw and assessed patient. Dr. Winfred Leeds recommended that surgery be called since patient is having anxiety and is nervous about the issue.   11:06 AM Spoke with Creig Hines, PA-C from [facility] general surgery - recommended that patient get basic blood work to be performed. [facility] to see patient.    Labs Review Labs Reviewed  CBC WITH DIFFERENTIAL - Abnormal; Notable for the following:    RBC 5.20 (*)    All other components within normal limits  COMPREHENSIVE METABOLIC PANEL - Abnormal; Notable for the following:    Sodium 134 (*)    Glucose, Bld 138 (*)    Creatinine, Ser 1.57 (*)    Calcium 7.8 (*)    GFR calc non Af Amer 41 (*)    GFR calc Af Amer 47 (*)    All other components within normal limits  ANAEROBIC CULTURE  CULTURE, ROUTINE-ABSCESS   Imaging Review No results found.  EKG Interpretation     Ventricular Rate:    PR Interval:    QRS Duration:   QT Interval:    QTC Calculation:   R Axis:     Text Interpretation:              MDM   1. Abscess of groin, left     Patient presenting to the ED with growth to her left groin region x 1 week with progressive growth.  Approximately 2 cm x 2 cm indurated lesion to the left groin region, just superior to the inguinal aspect. Negative inguinal lymphadenopathy. Negative mobility, hard upon palpation - indurated. Painful upon palpation. Negative erythema, inflammation, swelling noted. Full ROM and strength intact to the lower extremities bilaterally. Pulses palpable. Bedside US performed that noted hyperechoic fluid. Patient seen and assessed by Dr. Lyn Hollingshead - recommended to consult [facility].  Creig Hines,  PA-C from [facility] to see patient, recommended basic blood work.  Suspicion to be possible abscess since increased in growth over one week, painful upon palpation. Still need to rule out possible lypoma, lymphoma since patient has strong cancer history - hyperechoic fluid on [place] noted.  CBC negative elevation in WBC with negative leukocytosis noted. Elevated Cr of 1.58 noted. Mildly low sodium of 134.  Patient admitted to the hospital and brought to the OR for drainage of abscess to be performed by Dr. Lucia Gaskins of [facility].     Jamse Mead, PA-C 10/10/13 0007

## 2013-10-09 NOTE — ED Provider Notes (Signed)
Complains of painful swollen area left suprapubic area for approximately past week. No other complaint. On exam alert nontoxic patient has tender reddened call pulseless area left suprapubic area not involving inguinal crease.  Orlie Dakin, MD 10/09/13 1051

## 2013-10-09 NOTE — Op Note (Signed)
10/09/2013  5:01 PM  PATIENT:  [name], 37 y.o., female, MRN: [id number]  PREOP DIAGNOSIS:  groin cyst left, infected  POSTOP DIAGNOSIS:   Left groin folliculitis with small abscess (2.5 cm)  PROCEDURE:   Procedure(s):  I&D of left groin abscess  SURGEON:   Alphonsa Overall, M.D.  ANESTHESIA:   IV sedation  Anesthesiologist: Salley Scarlet, MD CRNA: Lind Covert, CRNA  Choice  EBL:  minimal  ml  LOCAL MEDICATIONS USED:   10 cc of mixture of 1/4% marcaine and 1% xylocaine with epi  SPECIMEN:   Cultures obtained  COUNTS CORRECT:  YES  INDICATIONS FOR PROCEDURE:  [name] is a 37 y.o. (DOB: 12/29/1975) AA  female whose primary care physician is [facility], MD and comes for I&D of left groin abscess.   The indications and risks of the surgery were explained to the patient.  The risks include, but are not limited to, infection, bleeding, and nerve injury.  Procedure note:  [name] comes to the OR for I&D of left groin abscess.  We discussed trying to drain this in the ER, but the patient is adamant about draining it under anesthesia.  She understands the increased cost and risk.   A time out was held and the surgical check list run.   The left groin was prepped.  I infiltrated the left groin abscess area with   10 cc of mixture of 1/4% marcaine and 1% xylocaine with epi.  I made an incision into a 2 cm abscessed folliculitis.  I did a culture of the abscess.   I packed the wound with saline gauze and sterilely dressed the wound. She was transferred to the recovery room in good condition.  She woke up itching.  Alphonsa Overall, MD, [facility] Pager: [phone] Office phone:  [phone]

## 2013-10-09 NOTE — Transfer of Care (Signed)
Immediate Anesthesia Transfer of Care Note  Patient: [name]  Procedure(s) Performed: Procedure(s): INCISION AND DRAINAGE OF LEFT GROIN ABSCESS (Left)  Patient Location: PACU  Anesthesia Type:MAC  Level of Consciousness: sedated  Airway & Oxygen Therapy: Patient Spontanous Breathing and Patient connected to face mask oxygen  Post-op Assessment: Report given to PACU RN and Post -op Vital signs reviewed and stable  Post vital signs: Reviewed and stable  Complications: No apparent anesthesia complications

## 2013-10-09 NOTE — Progress Notes (Signed)
Patient is to shower on 10/10/13, remove packing, and redress her left groin wound. She has a hand held shower nozzle in shower. Instructed to shower on 10/10/13 and remove dressing. To wash hands before and after any wound care. To aim warm water from nozzle just above wound and clean wound. She is given sterile 2x2 gauze, paper tape (2 inch), and large stretch net mesh underwear. Instructed to use sterile gauze and stretch net mesh underwear to hold gauze in place. To use paper tape if needed. Patient is not to soak in a tub.

## 2013-10-09 NOTE — H&P (Signed)
Earnstine Regal, PA-C Physician Assistant Cosign Needed Surgery Consult Note Service date: 10/09/2013 11:55 AM  Reason for Consult: Left perineal growth Referring Physician: Orlie Dakin MD   [name] is an 37 y.o. female.   HPI: Complicated history with 2 renal transplants, on chronic immunosuppression, recently treated for pleurisy in September.  She was hospitalized in Sept with perirectal abscess drained by Dr. Arnoldo Morale in [place].   She noted an area left groin about 1 week ago that she described as about the size of the end of your pen.  Since then it has grown to about 2 cm.  She was seen by her PCPDr. Jenna Luo and Dr. Florene Glen, her nephrologist and was referred to surgery.  She cannot be seen till next week and is very  Anxious about it and presented to the ER here at [facility].  We are ask to see.    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  Crohn's disease       Diabetes mellitus  05/01/2013     IDDM       .  Renal disorder  atrophic left kidney, and s/p right nephrectomy   .  Anxiety/.  Panic disorder /OCD   .  Marland Kitchen  History of Cancer  2001 renal cell , breast cancer both breast 2011, cervical cancer 2013   Perirectal abscess  08/26/13  .  Hypothyroidism         parathroid runs low     Fracture  05/01/2013    Past Surgical History   Procedure  Laterality  Date   .  Nephrectomy transplanted organ    1999 and 2011   .  Parathyroidectomy    02-20-2001   .  Knee surgery  Left  april 2003       arthroscopy   .  Cystoscopy    02/13/2012       Procedure: CYSTOSCOPY;  Surgeon: Franchot Gallo, MD;  Location: [facility];  Service: Urology;  Laterality: N/A;  insertion of ureteral catheter , removed per Dr Diona Fanti    .  Tubal ligation    02-19-2000   .  Abdominal hysterectomy    02/13/2012       Procedure: HYSTERECTOMY ABDOMINAL;  Surgeon: Cyril Mourning, MD;  Location: [facility];  Service: Gynecology;   Laterality: N/A;   .  Breast lumpectomy and reconstruction  Bilateral  2011   .  Esophageal manometry  N/A  08/10/2013       Procedure: ESOPHAGEAL MANOMETRY (EM);  Surgeon: Beryle Beams, MD;  [facility]: [facility];  Service: Endoscopy;  Laterality: N/A;   .  Incision and drainage abscess  N/A  08/26/2013       Procedure: INCISION AND DRAINAGE PERIRECTAL ABSCESS;  Surgeon: Jamesetta So, MD;  Location: [facility];  Service: General;  Laterality: N/A;   .  Esophagogastroduodenoscopy  N/A  09/25/2013       Procedure: ESOPHAGOGASTRODUODENOSCOPY (EGD);  Surgeon: Beryle Beams, MD;  Location: [facility];  Service: Endoscopy;  Laterality: N/A;   .  Colonoscopy  N/A  09/25/2013       Procedure: COLONOSCOPY;  Surgeon: Beryle Beams, MD;  [facility];  Service: Endoscopy;  Laterality: N/A;      No family history on file.   Social History: reports that she has never smoked. She has never used  smokeless tobacco. She reports that she does not drink alcohol or use illicit drugs.   Allergies:   Allergies   Allergen  Reactions   .  Barium-Containing Compounds  Shortness Of Breath   .  Dilaudid [Hydromorphone Hcl]  Anaphylaxis   .  Hydromorphone  Shortness Of Breath   .  Ivp Dye [Iodinated Diagnostic Agents]  Anaphylaxis   .  Shellfish Allergy  Shortness Of Breath and Itching       Causes red, patchy areas to form   .  Vancomycin  Anaphylaxis   .  Adhesive [Tape]  Other (See Comments)       Dermatitis    .  Infed [Iron Dextran]  Itching   .  Sulfa Antibiotics  Hives   .  Iopamidol  Itching and Rash      Medications:   Prior to Admission medications    Medication  Sig  Start Date  End Date  Taking?  Authorizing Provider   atorvastatin (LIPITOR) 20 MG tablet  Take 20 mg by mouth at bedtime.      Yes  Historical Provider, MD   busPIRone (BUSPAR) 15 MG tablet  Take 15 mg by mouth 2 (two) times daily.   05/05/13    Yes  Elmarie Shiley, NP   calcitRIOL (ROCALTROL) 0.25 MCG capsule  Take 1.75  mcg by mouth every morning.  05/05/13    Yes  Elmarie Shiley, NP   calcium carbonate (OS-CAL - DOSED IN MG OF ELEMENTAL CALCIUM) 1250 MG tablet  Take 1 tablet by mouth 3 (three) times daily.  05/05/13    Yes  Elmarie Shiley, NP   cetirizine (ZYRTEC) 10 MG tablet  Take 10 mg by mouth every evening.  05/05/13    Yes  Elmarie Shiley, NP   cilostazol (PLETAL) 50 MG tablet  Take 50 mg by mouth 2 (two) times daily.  05/05/13    Yes  Elmarie Shiley, NP   cyclobenzaprine (FLEXERIL) 10 MG tablet  Take 1 tablet (10 mg total) by mouth 3 (three) times daily as needed for muscle spasms.  10/05/13    Yes  Susy Frizzle, MD   FLUoxetine (PROZAC) 20 MG capsule  Take 60 mg by mouth every morning.      Yes  Historical Provider, MD   HYDROcodone-acetaminophen (NORCO/VICODIN) 5-325 MG per tablet  Take 2 tablets by mouth every 6 (six) hours as needed for pain.  09/11/13    Yes  Charles B. Karle Starch, MD   hydrOXYzine (ATARAX/VISTARIL) 25 MG tablet  Take 50 mg by mouth at bedtime.       Yes  Historical Provider, MD   insulin aspart (NOVOLOG) 100 UNIT/ML injection  Inject 15 Units into the skin 3 (three) times daily before meals.  08/27/13    Yes  Marianne L York, PA-C   insulin glargine (LANTUS) 100 UNIT/ML injection  Inject 45 Units into the skin at bedtime.   05/15/13    Yes  Susy Frizzle, MD   Linaclotide [facility]) 145 MCG CAPS  Take 145 mcg by mouth daily as needed (pain).       Yes  Historical Provider, MD   metoCLOPramide (REGLAN) 5 MG tablet  Take 5 mg by mouth 3 (three) times daily. Frequency unspecified  05/05/13    Yes  Elmarie Shiley, NP   mycophenolate (MYFORTIC) 180 MG EC tablet  Take 540 mg by mouth 2 (two) times daily.  05/05/13    Yes  Elmarie Shiley, NP  NOVOLOG FLEXPEN 100 UNIT/ML SOPN FlexPen  INJECT 18 UNITS INTO THE SKIN THREE TIMES DAILY BEFORE MEALS.  09/07/13    Yes  Susy Frizzle, MD   omeprazole (PRILOSEC) 40 MG capsule  Take 40 mg by mouth 2 (two) times daily.       Yes  Historical Provider, MD   Oxycodone HCl 10 MG  TABS  Take 1 tablet (10 mg total) by mouth 3 (three) times daily as needed. For pain  10/05/13    Yes  Susy Frizzle, MD   oxyCODONE-acetaminophen (PERCOCET/ROXICET) 5-325 MG per tablet  Take 1 tablet by mouth every 4 (four) hours as needed for pain.  [id number]    Yes  Delora Fuel, MD   predniSONE (DELTASONE) 10 MG tablet  Take 10 mg by mouth daily.      Yes  Historical Provider, MD   tacrolimus (PROGRAF) 0.5 MG capsule  Take 3.5 mg by mouth 2 (two) times daily. Take with 1mg  capsules for total of 3.5mg  total dose twice daily  05/05/13    Yes  Elmarie Shiley, NP   tacrolimus (PROGRAF) 1 MG capsule  Take 3.5 mg by mouth 2 (two) times daily. Pt takes a total of 3.5 mg      Yes  Historical Provider, MD   tiotropium (SPIRIVA) 18 MCG inhalation capsule  Place 18 mcg into inhaler and inhale daily.      Yes  Historical Provider, MD   topiramate (TOPAMAX) 25 MG tablet  Take 4 tablets (100 mg total) by mouth daily.  10/05/13    Yes  Susy Frizzle, MD     Review of Systems  Constitutional: Positive for fever (low grade 99 range) and weight loss (says she lost 6 pound this week). Negative for chills, malaise/fatigue and diaphoresis.  HENT: Negative.   Eyes: Negative.   Respiratory: Negative.   Cardiovascular: Negative.   Gastrointestinal: Positive for heartburn (chronic with daily reflux), nausea (with reflux), abdominal pain and diarrhea (chronic with Crohn's ). Negative for vomiting, constipation, blood in stool and melena.  Genitourinary: Negative.   Musculoskeletal: Negative.         Rubbing on left side groin hurts walking now.  Skin:        Groin site started as a pin head size now larger over 1 week period.  Neurological: Negative.  Negative for weakness.  Endo/Heme/Allergies: Negative.   Psychiatric/Behavioral: The patient is nervous/anxious.   Blood pressure 103/77, pulse 80, temperature 98.5 F (36.9 C), temperature source Oral, resp. rate 18, last menstrual period 01/30/2012, SpO2  100.00%. Physical Exam  Constitutional: She is oriented to person, place, and time. She appears well-developed and well-nourished.  Overweight female in no acute distress BP 103/77  Pulse 80  Temp(Src) 98.5 F (36.9 C) (Oral)  Resp 18  SpO2 100%  LMP 01/30/2012  HENT:   Head: Normocephalic and atraumatic.  Eyes: Conjunctivae and EOM are normal. Pupils are equal, round, and reactive to light. Right eye exhibits no discharge. Left eye exhibits no discharge. No scleral icterus.  Neck: Normal range of motion. Neck supple. No JVD present. No tracheal deviation present. No thyromegaly present.  Cardiovascular: Normal rate, regular rhythm, normal heart sounds and intact distal pulses.  Exam reveals no gallop.    No murmur heard. You can hear her fistula over chest aortic position  Respiratory: Effort normal and breath sounds normal. No respiratory distress. She has no wheezes. She has no rales. She exhibits no tenderness.  GI: Soft.  Bowel sounds are normal. She exhibits no distension and no mass. There is no tenderness. There is no rebound and no guarding.  Scars noted from prior surgeries.  Genitourinary:  No other sites of infection note in the perineium, around the vagina, or the rectum.  Musculoskeletal: Normal range of motion. She exhibits no edema.  Lymphadenopathy:    She has no cervical adenopathy.  Neurological: She is alert and oriented to person, place, and time. No cranial nerve deficit.  Skin: Skin is warm and dry. No rash noted. No erythema. No pallor.     Psychiatric: She has a normal mood and affect. Her behavior is normal.   Assessment/Plan: 1. Possible abscess left groin      S/p perirectal abscess 08/26/13 with I&D 08/26/13 - Dr. Arnoldo Morale in [place]. 2.  S/p 2 renal transplants, on chronic immunosuppressive therapy. 3.  AODM 4.  Anxiety/panic disorder/OCD 5.  Crohn's disease 6.  Hypothyroid 7.  Hx of renal cell cancer, breast and cervical cancer 8.   EGD/colonoscopy/ polypectomy 09/25/13 -   Path was negative for malignancy. 9.  BMI 46  Plan:  Pt has been examined and Dr. Lucia Gaskins recommended bedside I&D.  Pt refused and wanted it done in the OR, so we plan to take her to the OR later this afternoon for I&D of left groin abscess.  JENNINGS,WILLARD 10/09/2013, 11:55 AM   I discussed the treatment plan with the patient.  This area in her left groin is small enough to easily handle under local anesthesia in the ER.  But the patient is adamant that this be done in the OR under anesthesia. I discussed the risks to the patient, but she will not let us try to do this in there ER. Jackelyn Knife, in room with patient.  Alphonsa Overall, MD, [facility] Surgery Pager: [phone] Office phone:  [phone]

## 2013-10-09 NOTE — Anesthesia Postprocedure Evaluation (Addendum)
  Anesthesia Post-op Note  Patient: [name]  Procedure(s) Performed: Procedure(s) (LRB): INCISION AND DRAINAGE OF LEFT GROIN ABSCESS (Left)  Patient Location: PACU  Anesthesia Type: MAC  Level of Consciousness: awake and alert   Airway and Oxygen Therapy: Patient Spontanous Breathing  Post-op Pain: mild  Post-op Assessment: Post-op Vital signs reviewed, Patient's Cardiovascular Status Stable, Respiratory Function Stable, Patent Airway and No signs of Nausea or vomiting  Last Vitals:  Filed Vitals:   10/09/13 1750  BP: 111/60  Pulse: 62  Temp: 36.7 C  Resp:     Post-op Vital Signs: stable   Complications: No apparent anesthesia complications. She had some itching at the end of the procedure upon awaking from the MAC. No significant welts, no SOB, no drop in blood pressure. Treated with Benadryl and hydrocortisone. Question reaction to ancef. Dr. Lucia Gaskins aware.

## 2013-10-09 NOTE — ED Notes (Signed)
Per pt-has mss on upper left thigh-saw PCP and was told to see surgeon-was told to come to ED if it got worse

## 2013-10-09 NOTE — Consult Note (Signed)
Reason for Consult: Left perineal growth Referring Physician: Orlie Dakin MD  [name] is an 37 y.o. female.  HPI: Complicated history with 2 renal transplants, on chronic immunosuppression, recently treated for pleurisy in September.  She was hospitalized in Sept with perirectal abscess drained by Dr. Arnoldo Morale in [place].   She noted an area left groin about 1 week ago that she described as about the size of the end of your pen.  Since then it has grown to about 2 cm.  She was seen by her PCPDr. Jenna Luo and Dr. Florene Glen, her nephrologist and was referred to surgery.  She cannot be seen till next week and is very  Anxious about it and presented to the ER here at [facility].  We are ask to see.  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 Crohn's disease    Diabetes mellitus 05/01/2013   IDDM      . Renal disorder  atrophic left kidney, and s/p right nephrectomy     . Anxiety/ . Panic disorder       OCD  . Marland Kitchen History of Cancer 2001    renal cell , breast cancer both breast 2011, cervical cancer 2013       Perirectal abscess  08/26/13   . Hypothyroidism     parathroid runs low   Fracture 05/01/2013               Past Surgical History  Procedure Laterality Date  . Nephrectomy transplanted organ  1999 and 2011  . Parathyroidectomy  02-20-2001  . Knee surgery Left april 2003    arthroscopy  . Cystoscopy  02/13/2012    Procedure: CYSTOSCOPY;  Surgeon: Franchot Gallo, MD;  Location: [facility];  Service: Urology;  Laterality: N/A;  insertion of ureteral catheter , removed per Dr Diona Fanti   . Tubal ligation  02-19-2000  . Abdominal hysterectomy  02/13/2012    Procedure: HYSTERECTOMY ABDOMINAL;  Surgeon: Cyril Mourning, MD;  Location: [facility];  Service: Gynecology;  Laterality: N/A;  . Breast lumpectomy and reconstruction Bilateral 2011  . Esophageal manometry N/A 08/10/2013    Procedure: ESOPHAGEAL MANOMETRY  (EM);  Surgeon: Beryle Beams, MD;  [facility];  Service: Endoscopy;  Laterality: N/A;  . Incision and drainage abscess N/A 08/26/2013    Procedure: INCISION AND DRAINAGE PERIRECTAL ABSCESS;  Surgeon: Jamesetta So, MD;  Location: [facility];  Service: General;  Laterality: N/A;  . Esophagogastroduodenoscopy N/A 09/25/2013    Procedure: ESOPHAGOGASTRODUODENOSCOPY (EGD);  Surgeon: Beryle Beams, MD;  Location: [facility];  Service: Endoscopy;  Laterality: N/A;  . Colonoscopy N/A 09/25/2013    Procedure: COLONOSCOPY;  Surgeon: Beryle Beams, MD;  [facility];  Service: Endoscopy;  Laterality: N/A;    No family history on file.  Social History:  reports that she has never smoked. She has never used smokeless tobacco. She reports that she does not drink alcohol or use illicit drugs.  Allergies:  Allergies  Allergen Reactions  . Barium-Containing Compounds Shortness Of Breath  . Dilaudid [Hydromorphone Hcl] Anaphylaxis  . Hydromorphone Shortness Of Breath  . Ivp Dye [Iodinated Diagnostic Agents] Anaphylaxis  . Shellfish Allergy Shortness Of Breath and Itching    Causes red, patchy areas to form  . Vancomycin Anaphylaxis  . Adhesive [Tape] Other (See Comments)    Dermatitis   . Infed [Iron Dextran] Itching  . Sulfa  Antibiotics Hives  . Iopamidol Itching and Rash    Medications:  Prior to Admission medications   Medication Sig Start Date End Date Taking? Authorizing Provider  atorvastatin (LIPITOR) 20 MG tablet Take 20 mg by mouth at bedtime.   Yes Historical Provider, MD  busPIRone (BUSPAR) 15 MG tablet Take 15 mg by mouth 2 (two) times daily.  05/05/13  Yes Elmarie Shiley, NP  calcitRIOL (ROCALTROL) 0.25 MCG capsule Take 1.75 mcg by mouth every morning. 05/05/13  Yes Elmarie Shiley, NP  calcium carbonate (OS-CAL - DOSED IN MG OF ELEMENTAL CALCIUM) 1250 MG tablet Take 1 tablet by mouth 3 (three) times daily. 05/05/13  Yes Elmarie Shiley, NP  cetirizine (ZYRTEC) 10 MG tablet  Take 10 mg by mouth every evening. 05/05/13  Yes Elmarie Shiley, NP  cilostazol (PLETAL) 50 MG tablet Take 50 mg by mouth 2 (two) times daily. 05/05/13  Yes Elmarie Shiley, NP  cyclobenzaprine (FLEXERIL) 10 MG tablet Take 1 tablet (10 mg total) by mouth 3 (three) times daily as needed for muscle spasms. 10/05/13  Yes Susy Frizzle, MD  FLUoxetine (PROZAC) 20 MG capsule Take 60 mg by mouth every morning.   Yes Historical Provider, MD  HYDROcodone-acetaminophen (NORCO/VICODIN) 5-325 MG per tablet Take 2 tablets by mouth every 6 (six) hours as needed for pain. 09/11/13  Yes Charles B. Karle Starch, MD  hydrOXYzine (ATARAX/VISTARIL) 25 MG tablet Take 50 mg by mouth at bedtime.    Yes Historical Provider, MD  insulin aspart (NOVOLOG) 100 UNIT/ML injection Inject 15 Units into the skin 3 (three) times daily before meals. 08/27/13  Yes Marianne L York, PA-C  insulin glargine (LANTUS) 100 UNIT/ML injection Inject 45 Units into the skin at bedtime.  05/15/13  Yes Susy Frizzle, MD  Linaclotide [facility]) 145 MCG CAPS Take 145 mcg by mouth daily as needed (pain).    Yes Historical Provider, MD  metoCLOPramide (REGLAN) 5 MG tablet Take 5 mg by mouth 3 (three) times daily. Frequency unspecified 05/05/13  Yes Elmarie Shiley, NP  mycophenolate (MYFORTIC) 180 MG EC tablet Take 540 mg by mouth 2 (two) times daily. 05/05/13  Yes Elmarie Shiley, NP  NOVOLOG FLEXPEN 100 UNIT/ML SOPN FlexPen INJECT 18 UNITS INTO THE SKIN THREE TIMES DAILY BEFORE MEALS. 09/07/13  Yes Susy Frizzle, MD  omeprazole (PRILOSEC) 40 MG capsule Take 40 mg by mouth 2 (two) times daily.    Yes Historical Provider, MD  Oxycodone HCl 10 MG TABS Take 1 tablet (10 mg total) by mouth 3 (three) times daily as needed. For pain 10/05/13  Yes Susy Frizzle, MD  oxyCODONE-acetaminophen (PERCOCET/ROXICET) 5-325 MG per tablet Take 1 tablet by mouth every 4 (four) hours as needed for pain. [id number]  Yes Delora Fuel, MD  predniSONE (DELTASONE) 10 MG tablet Take 10 mg by mouth  daily.   Yes Historical Provider, MD  tacrolimus (PROGRAF) 0.5 MG capsule Take 3.5 mg by mouth 2 (two) times daily. Take with 1mg  capsules for total of 3.5mg  total dose twice daily 05/05/13  Yes Elmarie Shiley, NP  tacrolimus (PROGRAF) 1 MG capsule Take 3.5 mg by mouth 2 (two) times daily. Pt takes a total of 3.5 mg   Yes Historical Provider, MD  tiotropium (SPIRIVA) 18 MCG inhalation capsule Place 18 mcg into inhaler and inhale daily.   Yes Historical Provider, MD  topiramate (TOPAMAX) 25 MG tablet Take 4 tablets (100 mg total) by mouth daily. 10/05/13  Yes Susy Frizzle, MD    Prior to  Admission:  (Not in a hospital admission) Scheduled: Continuous: PRN:   Anti-infectives   None      No results found for this or any previous visit (from the past 48 hour(s)).  No results found.  Review of Systems  Constitutional: Positive for fever (low grade 99 range) and weight loss (says she lost 6 pound this week). Negative for chills, malaise/fatigue and diaphoresis.  HENT: Negative.   Eyes: Negative.   Respiratory: Negative.   Cardiovascular: Negative.   Gastrointestinal: Positive for heartburn (chronic with daily reflux), nausea (with reflux), abdominal pain and diarrhea (chronic with Crohn's ). Negative for vomiting, constipation, blood in stool and melena.  Genitourinary: Negative.   Musculoskeletal: Negative.        Rubbing on left side groin hurts walking now.  Skin:       Groin site started as a pin head size now larger over 1 week period.  Neurological: Negative.  Negative for weakness.  Endo/Heme/Allergies: Negative.   Psychiatric/Behavioral: The patient is nervous/anxious.    Blood pressure 103/77, pulse 80, temperature 98.5 F (36.9 C), temperature source Oral, resp. rate 18, last menstrual period 01/30/2012, SpO2 100.00%. Physical Exam  Constitutional: She is oriented to person, place, and time. She appears well-developed and well-nourished.  Overweight female in no acute  distress BP 103/77  Pulse 80  Temp(Src) 98.5 F (36.9 C) (Oral)  Resp 18  SpO2 100%  LMP 01/30/2012   HENT:  Head: Normocephalic and atraumatic.  Eyes: Conjunctivae and EOM are normal. Pupils are equal, round, and reactive to light. Right eye exhibits no discharge. Left eye exhibits no discharge. No scleral icterus.  Neck: Normal range of motion. Neck supple. No JVD present. No tracheal deviation present. No thyromegaly present.  Cardiovascular: Normal rate, regular rhythm, normal heart sounds and intact distal pulses.  Exam reveals no gallop.   No murmur heard. You can hear her fistula over chest aortic position  Respiratory: Effort normal and breath sounds normal. No respiratory distress. She has no wheezes. She has no rales. She exhibits no tenderness.  GI: Soft. Bowel sounds are normal. She exhibits no distension and no mass. There is no tenderness. There is no rebound and no guarding.  Scars noted from prior surgeries.  Genitourinary:  No other sites of infection note in the perineium, around the vagina, or the rectum.  Musculoskeletal: Normal range of motion. She exhibits no edema.  Lymphadenopathy:    She has no cervical adenopathy.  Neurological: She is alert and oriented to person, place, and time. No cranial nerve deficit.  Skin: Skin is warm and dry. No rash noted. No erythema. No pallor.     Psychiatric: She has a normal mood and affect. Her behavior is normal. Judgment and thought content normal.    Assessment/Plan: 1. Possible abscess left groin      S/p perirectal abscess 08/26/13 with I&D 08/26/13. 2.  S/p 2 renal transplants, on chronic immunosuppressive therapy. 3.  AODM 4. Anxiety/panic disorder/OCD 5. Crohn's disease 6.  Hypothyroid 7.  Hx of renal cell cancer, breast and cervical cancer 8. EGD/colonoscopy/ polypectomy 09/25/13 -   Path was negative for malignancy. 9.  BMI 46  Plan:  Pt has been examined and Dr. Lucia Gaskins recommended bedside I&D.  Pt refused and  wanted it done in the OR, so we plan to take her to the OR later this afternoon for I&D of left groin abscess.  JENNINGS,WILLARD 10/09/2013, 11:55 AM   [Of note, I have already edited this  document - but Epic did not save my documentation.  Go figure.] I explained to the patient that this abscess could be I&Ded in the ER, would be quicker and safer, would avoid anesthesia, but the patient is adamant about having anesthesia for the procedure. Will proceed to take the patient to the OR.  Alphonsa Overall, MD, [facility] Surgery Pager: [phone] Office phone:  [phone]

## 2013-10-10 NOTE — ED Provider Notes (Signed)
Medical screening examination/treatment/procedure(s) were conducted as a shared visit with non-physician practitioner(s) and myself.  I personally evaluated the patient during the encounter  Orlie Dakin, MD 10/10/13 [phone]

## 2013-10-12 ENCOUNTER — Encounter (HOSPITAL_COMMUNITY): Payer: Self-pay | Admitting: Surgery

## 2013-10-12 LAB — GLUCOSE, CAPILLARY: Glucose-Capillary: 93 mg/dL (ref 70–99)

## 2013-10-13 LAB — CULTURE, ROUTINE-ABSCESS

## 2013-10-14 LAB — ANAEROBIC CULTURE

## 2013-10-18 ENCOUNTER — Encounter (HOSPITAL_COMMUNITY): Payer: Self-pay | Admitting: Emergency Medicine

## 2013-10-18 ENCOUNTER — Emergency Department (HOSPITAL_COMMUNITY): Payer: Medicare HMO

## 2013-10-18 ENCOUNTER — Emergency Department (HOSPITAL_COMMUNITY)
Admission: EM | Admit: 2013-10-18 | Discharge: 2013-10-18 | Disposition: A | Discharge status: Home / Self Care | Payer: Medicare HMO | Attending: Emergency Medicine | Admitting: Emergency Medicine

## 2013-10-18 DIAGNOSIS — Z79899 Other long term (current) drug therapy: Secondary | ICD-10-CM | POA: Insufficient documentation

## 2013-10-18 DIAGNOSIS — Z853 Personal history of malignant neoplasm of breast: Secondary | ICD-10-CM | POA: Insufficient documentation

## 2013-10-18 DIAGNOSIS — F410 Panic disorder [episodic paroxysmal anxiety] without agoraphobia: Secondary | ICD-10-CM | POA: Insufficient documentation

## 2013-10-18 DIAGNOSIS — Z87448 Personal history of other diseases of urinary system: Secondary | ICD-10-CM | POA: Insufficient documentation

## 2013-10-18 DIAGNOSIS — Z8541 Personal history of malignant neoplasm of cervix uteri: Secondary | ICD-10-CM | POA: Insufficient documentation

## 2013-10-18 DIAGNOSIS — Z794 Long term (current) use of insulin: Secondary | ICD-10-CM | POA: Insufficient documentation

## 2013-10-18 DIAGNOSIS — M722 Plantar fascial fibromatosis: Secondary | ICD-10-CM | POA: Insufficient documentation

## 2013-10-18 DIAGNOSIS — E119 Type 2 diabetes mellitus without complications: Secondary | ICD-10-CM | POA: Insufficient documentation

## 2013-10-18 DIAGNOSIS — IMO0002 Reserved for concepts with insufficient information to code with codable children: Secondary | ICD-10-CM | POA: Insufficient documentation

## 2013-10-18 DIAGNOSIS — Z862 Personal history of diseases of the blood and blood-forming organs and certain disorders involving the immune mechanism: Secondary | ICD-10-CM | POA: Insufficient documentation

## 2013-10-18 DIAGNOSIS — Z8781 Personal history of (healed) traumatic fracture: Secondary | ICD-10-CM | POA: Insufficient documentation

## 2013-10-18 DIAGNOSIS — Z85528 Personal history of other malignant neoplasm of kidney: Secondary | ICD-10-CM | POA: Insufficient documentation

## 2013-10-18 DIAGNOSIS — K5090 Crohn's disease, unspecified, without complications: Secondary | ICD-10-CM | POA: Insufficient documentation

## 2013-10-18 MED ORDER — MELOXICAM 7.5 MG PO TABS: 7.5000 mg | ORAL_TABLET | Freq: Every day | ORAL | Status: DC

## 2013-10-18 MED ORDER — OXYCODONE-ACETAMINOPHEN 5-325 MG PO TABS: 2.0000 | ORAL_TABLET | ORAL | Status: DC | PRN

## 2013-10-18 MED ORDER — HYDROCODONE-ACETAMINOPHEN 5-325 MG PO TABS: 1.0000 | ORAL_TABLET | ORAL | Status: DC | PRN

## 2013-10-18 NOTE — ED Provider Notes (Signed)
CSN: [id number]     Arrival date & time 10/18/13  2008 History   First MD Initiated Contact with Patient 10/18/13 2022     Chief Complaint  Patient presents with  . Foot Pain   (Consider location/radiation/quality/duration/timing/severity/associated sxs/prior Treatment) Patient is a 37 y.o. female presenting with lower extremity pain. The history is provided by the patient.  Foot Pain This is a new problem. The current episode started today. The problem occurs constantly. The problem has been gradually worsening.   [name] is a 37 y.o. female who presents to the ED with left foot pain. She woke approximately 5 am with severe pain that started in the bottom of her foot and went to the lateral aspect. She is concerned because she has brittle bones and has had spontaneous fractures in the past.    Past Medical History  Diagnosis Date  . Chronic anemia   . Panic disorder   . Crohn's disease   . Anxiety     OCD  . 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 Fracture 05/01/2013    Right foot, in cast  . Hypothyroidism     parathroid runs low  . History of blood transfusion 2 years ago  . Cancer 2001    renal cell , breast cancer both breast 2011, cervical cancer 2013  . Renal disorder   . Complication of anesthesia     hard to sedate dr hung aware  . PONV (postoperative nausea and vomiting)    Past Surgical History  Procedure Laterality Date  . Nephrectomy transplanted organ  1999 and 2011  . Parathyroidectomy  02-20-2001  . Knee surgery Left april 2003    arthroscopy  . Cystoscopy  02/13/2012    Procedure: CYSTOSCOPY;  Surgeon: Franchot Gallo, MD;  Location: [facility];  Service: Urology;  Laterality: N/A;  insertion of ureteral catheter , removed per Dr Diona Fanti   . Tubal ligation  02-19-2000  . Abdominal hysterectomy  02/13/2012    Procedure: HYSTERECTOMY ABDOMINAL;  Surgeon: Cyril Mourning, MD;  Location: [facility];  Service: Gynecology;  Laterality: N/A;  . Breast  lumpectomy and reconstruction Bilateral 2011  . Esophageal manometry N/A 08/10/2013    Procedure: ESOPHAGEAL MANOMETRY (EM);  Surgeon: Beryle Beams, MD;  Location: [facility];  Service: Endoscopy;  Laterality: N/A;  . Incision and drainage abscess N/A 08/26/2013    Procedure: INCISION AND DRAINAGE PERIRECTAL ABSCESS;  Surgeon: Jamesetta So, MD;  Location: [facility];  Service: General;  Laterality: N/A;  . Esophagogastroduodenoscopy N/A 09/25/2013    Procedure: ESOPHAGOGASTRODUODENOSCOPY (EGD);  Surgeon: Beryle Beams, MD;  Location: [facility];  Service: Endoscopy;  Laterality: N/A;  . Colonoscopy N/A 09/25/2013    Procedure: COLONOSCOPY;  Surgeon: Beryle Beams, MD;  Location: [facility];  Service: Endoscopy;  Laterality: N/A;  . Groin dissection Left 10/09/2013    Procedure: INCISION AND DRAINAGE OF LEFT GROIN ABSCESS;  Surgeon: Shann Medal, MD;  Location: [facility];  Service: General;  Laterality: Left;   History reviewed. No pertinent family history. History  Substance Use Topics  . Smoking status: Never Smoker   . Smokeless tobacco: Never Used  . Alcohol Use: No   OB History   Grav Para Term Preterm Abortions TAB SAB Ect Mult Living                 Review of Systems 10 systems reviewed and negative  MS system positive for left foot pain Allergies  Barium-containing compounds; Dilaudid; Hydromorphone; Ivp dye; Shellfish allergy; Vancomycin; Adhesive; Infed; Sulfa antibiotics; and Iopamidol  Home Medications   Current Outpatient Rx  Name  Route  Sig  Dispense  Refill  . atorvastatin (LIPITOR) 20 MG tablet   Oral   Take 20 mg by mouth at bedtime.         . busPIRone (BUSPAR) 15 MG tablet   Oral   Take 15 mg by mouth 2 (two) times daily.          . calcitRIOL (ROCALTROL) 0.25 MCG capsule   Oral   Take 1.75 mcg by mouth every morning.         . calcium carbonate (OS-CAL - DOSED IN MG OF ELEMENTAL CALCIUM) 1250 MG tablet   Oral   Take 1 tablet by mouth 3  (three) times daily.         . cetirizine (ZYRTEC) 10 MG tablet   Oral   Take 10 mg by mouth every evening.         . cilostazol (PLETAL) 50 MG tablet   Oral   Take 50 mg by mouth 2 (two) times daily.         . cyclobenzaprine (FLEXERIL) 10 MG tablet   Oral   Take 1 tablet (10 mg total) by mouth 3 (three) times daily as needed for muscle spasms.   60 tablet   0   . FLUoxetine (PROZAC) 20 MG capsule   Oral   Take 60 mg by mouth every morning.         . hydrOXYzine (ATARAX/VISTARIL) 25 MG tablet   Oral   Take 50 mg by mouth at bedtime.          . insulin aspart (NOVOLOG FLEXPEN) 100 UNIT/ML SOPN FlexPen   Subcutaneous   Inject 18 Units into the skin 3 (three) times daily with meals.         . insulin aspart (NOVOLOG) 100 UNIT/ML injection   Subcutaneous   Inject 15 Units into the skin 3 (three) times daily before meals.   1 vial   12   . insulin glargine (LANTUS) 100 UNIT/ML injection   Subcutaneous   Inject 45 Units into the skin at bedtime.          . Linaclotide (LINZESS) 145 MCG CAPS   Oral   Take 145 mcg by mouth daily as needed (pain).          Marland Kitchen metoCLOPramide (REGLAN) 5 MG tablet   Oral   Take 5 mg by mouth 3 (three) times daily. Frequency unspecified         . mycophenolate (MYFORTIC) 180 MG EC tablet   Oral   Take 540 mg by mouth 2 (two) times daily.         Marland Kitchen omeprazole (PRILOSEC) 40 MG capsule   Oral   Take 40 mg by mouth 2 (two) times daily.          . Oxycodone HCl 10 MG TABS   Oral   Take 1 tablet (10 mg total) by mouth 3 (three) times daily as needed. For pain   90 tablet   0   . predniSONE (DELTASONE) 10 MG tablet   Oral   Take 10 mg by mouth daily.         . tacrolimus (PROGRAF) 0.5 MG capsule   Oral   Take 3.5 mg by mouth 2 (two) times daily. Take with 1mg  capsules for total of 3.5mg  total dose  twice daily         . tacrolimus (PROGRAF) 1 MG capsule   Oral   Take 3.5 mg by mouth 2 (two) times daily. Pt  takes a total of 3.5 mg         . tiotropium (SPIRIVA) 18 MCG inhalation capsule   Inhalation   Place 18 mcg into inhaler and inhale daily.         Marland Kitchen topiramate (TOPAMAX) 25 MG tablet   Oral   Take 4 tablets (100 mg total) by mouth daily.   120 tablet   2    BP 146/93  Pulse 94  Temp(Src) 98.8 F (37.1 C) (Oral)  Resp 18  Ht 5\' 2"  (1.575 m)  Wt 260 lb (117.935 kg)  BMI 47.54 kg/m2  SpO2 96%  LMP 01/30/2012 Physical Exam  Nursing note and vitals reviewed. Constitutional: She is oriented to person, place, and time. She appears well-developed and well-nourished.  HENT:  Head: Normocephalic and atraumatic.  Eyes: EOM are normal.  Neck: Neck supple.  Cardiovascular: Normal rate.   Pulmonary/Chest: Effort normal.  Musculoskeletal:       Left foot: She exhibits tenderness and swelling. She exhibits normal range of motion, no deformity and no laceration.       Feet:  Pedal pulses equal, adequate circulation, good touch sensation. Pain with palpation of plantar aspect of left foot that extends to the lateral aspect of the foot.   Neurological: She is alert and oriented to person, place, and time. She has normal strength. No cranial nerve deficit or sensory deficit.  Skin: Skin is warm and dry.  Psychiatric: She has a normal mood and affect. Her behavior is normal.    ED Course  Procedures Dg Foot Complete Left  10/18/2013   CLINICAL DATA:  Left foot pain.  EXAM: LEFT FOOT - COMPLETE 3+ VIEW  COMPARISON:  April 06, 2011.  FINDINGS: Old right 4th metatarsal fracture is noted. Arterial calcifications are noted. No acute fracture or dislocation is noted. Joint spaces are intact.  IMPRESSION: No acute abnormality seen in the left foot.   Electronically Signed   By: Sabino Dick M.D.   On: 10/18/2013 21:11    MDM  37 y.o. female with left foot pain that started less than 24 hours ago. Will treat with NSAIDS. Patient states she does not want narcotic pain medication that she has  plenty at home. Wanted to know if she had broken a bone. Patient stable for discharge home without any immediate complications. She will follow up with her PCP or return here as needed.    [place], [place] 10/18/13 2145

## 2013-10-18 NOTE — ED Provider Notes (Signed)
Medical screening examination/treatment/procedure(s) were performed by non-physician practitioner and as supervising physician I was immediately available for consultation/collaboration.  EKG Interpretation   None         Ezequiel Essex, MD 10/18/13 2249

## 2013-10-18 NOTE — ED Notes (Signed)
Pt c/o left foot pain.

## 2013-10-19 MED FILL — Oxycodone w/ Acetaminophen Tab 5-325 MG: ORAL | Qty: 6 | Status: AC

## 2013-10-22 ENCOUNTER — Encounter: Payer: Self-pay | Admitting: Family Medicine

## 2013-10-22 NOTE — Telephone Encounter (Signed)
Patient was seen at [facility]  On 10-09-2013 . Where she had a procedure done . They are needing a referral for this. The was done in there [facility]. Phone # [phone]

## 2013-11-04 ENCOUNTER — Telehealth: Payer: Self-pay | Admitting: Family Medicine

## 2013-11-04 NOTE — Telephone Encounter (Signed)
She needs a refill on her Oxycodone- she also said Dr. Dennard Schaumann had mentioned increasing to 4 a day

## 2013-11-04 NOTE — Telephone Encounter (Signed)
I would tell the patient to Dr. Dennard Schaumann is not available at present. Tell her that his last office note does not indicate that he was going to increase the amount. Tell her we will keep it at the current amount. Okay to go ahead and refill for the #90.

## 2013-11-04 NOTE — Telephone Encounter (Signed)
??   I do not see any mention of increase in notes but she is due for refill???

## 2013-11-04 NOTE — Telephone Encounter (Signed)
LMTRC

## 2013-11-05 MED ORDER — OXYCODONE HCL 10 MG PO TABS: 10.0000 mg | ORAL_TABLET | Freq: Three times a day (TID) | ORAL | Status: DC | PRN

## 2013-11-05 NOTE — Telephone Encounter (Signed)
Patient returned your call.

## 2013-11-05 NOTE — Telephone Encounter (Signed)
Pt states that she was to try flexeril first and she did with no help so now she is wanting to increase her oxycodone to 4 a day. Told pt [facility] OOT and we could refill it for TID until he comes back and then we can do QID if [facility] approves.

## 2013-11-05 NOTE — Telephone Encounter (Signed)
Agree/Approved for TID.

## 2013-11-10 NOTE — Telephone Encounter (Signed)
I want to continue tid.  If we need to increase to QID I feel she needs a referral to a pain clinic.

## 2013-11-17 ENCOUNTER — Other Ambulatory Visit: Payer: Self-pay | Admitting: Gastroenterology

## 2013-11-18 ENCOUNTER — Encounter (HOSPITAL_COMMUNITY): Payer: Self-pay | Admitting: Pharmacy Technician

## 2013-11-18 ENCOUNTER — Encounter (HOSPITAL_COMMUNITY): Payer: Self-pay | Admitting: *Deleted

## 2013-11-18 NOTE — Telephone Encounter (Signed)
LMTRC

## 2013-11-18 NOTE — Telephone Encounter (Signed)
This encounter was created in error - please disregard.

## 2013-11-18 NOTE — Telephone Encounter (Signed)
Pt states that you said you would increase her dose to QID until we found out if the pain she was having was her kidney or not. She did see the kidney doctor and he has ordered a bunch of test on her mostly in December. She said she discussed this with you in OV. If she must go to a pain clinic she will but she would still like her med increased until we can get her into pain clinic.

## 2013-11-19 ENCOUNTER — Encounter (HOSPITAL_COMMUNITY): Payer: Self-pay | Admitting: Emergency Medicine

## 2013-11-19 ENCOUNTER — Observation Stay (HOSPITAL_COMMUNITY)
Admission: EM | Admit: 2013-11-19 | Discharge: 2013-11-20 | Disposition: A | Discharge status: Home / Self Care | Payer: Medicare HMO | Attending: Internal Medicine | Admitting: Internal Medicine

## 2013-11-19 DIAGNOSIS — N183 Chronic kidney disease, stage 3 unspecified: Secondary | ICD-10-CM | POA: Diagnosis present

## 2013-11-19 DIAGNOSIS — G4733 Obstructive sleep apnea (adult) (pediatric): Secondary | ICD-10-CM | POA: Diagnosis present

## 2013-11-19 DIAGNOSIS — Z940 Kidney transplant status: Secondary | ICD-10-CM

## 2013-11-19 DIAGNOSIS — N186 End stage renal disease: Secondary | ICD-10-CM | POA: Diagnosis present

## 2013-11-19 DIAGNOSIS — N390 Urinary tract infection, site not specified: Secondary | ICD-10-CM | POA: Diagnosis present

## 2013-11-19 DIAGNOSIS — D649 Anemia, unspecified: Secondary | ICD-10-CM

## 2013-11-19 DIAGNOSIS — I120 Hypertensive chronic kidney disease with stage 5 chronic kidney disease or end stage renal disease: Principal | ICD-10-CM | POA: Insufficient documentation

## 2013-11-19 DIAGNOSIS — K5090 Crohn's disease, unspecified, without complications: Secondary | ICD-10-CM | POA: Diagnosis present

## 2013-11-19 DIAGNOSIS — E119 Type 2 diabetes mellitus without complications: Secondary | ICD-10-CM | POA: Diagnosis present

## 2013-11-19 DIAGNOSIS — I10 Essential (primary) hypertension: Secondary | ICD-10-CM | POA: Diagnosis present

## 2013-11-19 MED ORDER — SODIUM CHLORIDE 0.9 % IV BOLUS (SEPSIS)
1000.0000 mL | Freq: Once | INTRAVENOUS | Status: AC
  Administered 2013-11-20: 1000 mL via INTRAVENOUS

## 2013-11-19 MED ORDER — FENTANYL CITRATE 0.05 MG/ML IJ SOLN
50.0000 ug | Freq: Once | INTRAMUSCULAR | Status: AC
  Administered 2013-11-20: 50 ug via INTRAVENOUS
  Filled 2013-11-19: qty 2

## 2013-11-19 MED ORDER — ONDANSETRON HCL 4 MG/2ML IJ SOLN
4.0000 mg | Freq: Once | INTRAMUSCULAR | Status: AC
  Administered 2013-11-20: 4 mg via INTRAVENOUS
  Filled 2013-11-19: qty 2

## 2013-11-19 NOTE — ED Notes (Signed)
Awoke this pm with pain left lower abdomen and generalized aches.  Had hemorrhoid banding 11/18/2013.  History of kidney transplant bilateral at [facility] ( 08/17/2010).

## 2013-11-19 NOTE — ED Notes (Signed)
Advised pt that we needed a urine specimen.

## 2013-11-19 NOTE — ED Provider Notes (Signed)
CSN: [id number]     Arrival date & time 11/19/13  2211 History  This chart was scribed for Erin Lower, MD by Roe Coombs, ED Scribe. The patient was seen in room APA11/APA11. Patient's care was started at 10:53 PM.      Chief Complaint  Patient presents with  . Abdominal Pain  . Fever    Hx Kidney transplant    Patient is a 37 y.o. female presenting with abdominal pain and fever. The history is provided by the patient. No language interpreter was used.  Abdominal Pain Pain location:  LLQ Pain radiates to:  Does not radiate Pain severity:  Mild Associated symptoms: chills and fever   Associated symptoms: no cough and no sore throat   Fever Temperature source: Temp at Triage 101.6. Onset quality:  Gradual Duration:  1 day Timing:  Constant Chronicity:  New Associated symptoms: chills and myalgias   Associated symptoms: no cough and no sore throat   Myalgias:    Location:  Generalized   HPI Comments: [name] is a 38 y.o. female with history of kidney transplant in 1999 and 2011 and Chron's disease presents to the Emergency Department complaining of fever onset earlier today. There is associated body aches and chills. She is also having mild, non-radiating left [name] abdominal pain at site of most recent kidney transplant. She is currently taking prednisone and Prograf. Patient further reports that she has nausea and vomiting at baseline secondary to Chron's, but none today. She says that she had a normal bowel movement earlier today without difficulty. She denies difficulty urinating, cough or sore throat. She had Tylenol 1500 mg about 2.5 hours ago prior to arrival. Patient states that she had a hemorrhoid banding on 11/18/13 and worries if this could have contributed to her current symtpoms. She has had no known recent sick contacts. She had had a flu shot this season.   Patient is also complaining a painful "bump" at the hairline of her groin. She says that it is  consistent in appearance with past abscesses. She has not noticed any drainage.  PCP - Jenna Luo, [facility]  Past Medical History  Diagnosis Date  . Chronic anemia   . Panic disorder   . Crohn's disease   . Anxiety     OCD  . 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 Fracture 05/01/2013    Right foot, in cast  . Hypothyroidism     parathroid runs low  . History of blood transfusion 2 years ago  . Cancer 2001    renal cell , breast cancer both breast 2011, cervical cancer 2013  . Complication of anesthesia     hard to sedate dr hung aware  . PONV (postoperative nausea and vomiting)   . Renal disorder     S/p '11-Kidney transplant-Dr. Florene Glen follows   Past Surgical History  Procedure Laterality Date  . Nephrectomy transplanted organ  1999 and 2011    last '11-[facility]  . Parathyroidectomy  02-20-2001  . Knee surgery Left april 2003    arthroscopy  . Cystoscopy  02/13/2012    Procedure: CYSTOSCOPY;  Surgeon: Franchot Gallo, MD;  Location: [facility];  Service: Urology;  Laterality: N/A;  insertion of ureteral catheter , removed per Dr Diona Fanti   . Tubal ligation  02-19-2000  . Abdominal hysterectomy  02/13/2012    Procedure: HYSTERECTOMY ABDOMINAL;  Surgeon: Cyril Mourning, MD;  Location: [facility];  Service: Gynecology;  Laterality: N/A;  .  Breast lumpectomy and reconstruction Bilateral 2011  . Esophageal manometry N/A 08/10/2013    Procedure: ESOPHAGEAL MANOMETRY (EM);  Surgeon: Beryle Beams, MD;  Location: [facility];  Service: Endoscopy;  Laterality: N/A;  . Incision and drainage abscess N/A 08/26/2013    Procedure: INCISION AND DRAINAGE PERIRECTAL ABSCESS;  Surgeon: Jamesetta So, MD;  Location: [facility];  Service: General;  Laterality: N/A;  . Esophagogastroduodenoscopy N/A 09/25/2013    Procedure: ESOPHAGOGASTRODUODENOSCOPY (EGD);  Surgeon: Beryle Beams, MD;  Location: [facility];  Service: Endoscopy;  Laterality: N/A;  . Colonoscopy N/A 09/25/2013     Procedure: COLONOSCOPY;  Surgeon: Beryle Beams, MD;  Location: [facility];  Service: Endoscopy;  Laterality: N/A;  . Groin dissection Left 10/09/2013    Procedure: INCISION AND DRAINAGE OF LEFT GROIN ABSCESS;  Surgeon: Shann Medal, MD;  Location: [facility];  Service: General;  Laterality: Left;  . Breast surgery      bilateral lumpectomy 4'11   No family history on file. History  Substance Use Topics  . Smoking status: Never Smoker   . Smokeless tobacco: Never Used  . Alcohol Use: No   OB History   Grav Para Term Preterm Abortions TAB SAB Ect Mult Living                 Review of Systems  Constitutional: Positive for fever and chills.  HENT: Negative for sore throat.   Respiratory: Negative for cough.   Gastrointestinal: Positive for abdominal pain.  Genitourinary: Negative for difficulty urinating.  Musculoskeletal: Positive for myalgias.  Skin:       Positive for boil to inguinal region consistent with forming abscess.  All other systems reviewed and are negative.    Allergies  Barium-containing compounds; Dilaudid; Hydromorphone; Ivp dye; Shellfish allergy; Vancomycin; Adhesive; Infed; Sulfa antibiotics; and Iopamidol  Home Medications   Current Outpatient Rx  Name  Route  Sig  Dispense  Refill  . atorvastatin (LIPITOR) 20 MG tablet   Oral   Take 20 mg by mouth at bedtime.         . busPIRone (BUSPAR) 15 MG tablet   Oral   Take 15 mg by mouth 2 (two) times daily.          . calcitRIOL (ROCALTROL) 0.25 MCG capsule   Oral   Take 1.75 mcg by mouth every morning.         . calcium carbonate (OS-CAL - DOSED IN MG OF ELEMENTAL CALCIUM) 1250 MG tablet   Oral   Take 1 tablet by mouth 3 (three) times daily.         . cetirizine (ZYRTEC) 10 MG tablet   Oral   Take 10 mg by mouth every evening.         . cilostazol (PLETAL) 50 MG tablet   Oral   Take 50 mg by mouth 2 (two) times daily.         . cyclobenzaprine (FLEXERIL) 10 MG tablet   Oral    Take 1 tablet (10 mg total) by mouth 3 (three) times daily as needed for muscle spasms.   60 tablet   0   . FLUoxetine (PROZAC) 20 MG capsule   Oral   Take 60 mg by mouth every morning.         . hydrOXYzine (ATARAX/VISTARIL) 25 MG tablet   Oral   Take 50 mg by mouth at bedtime.          . insulin aspart (NOVOLOG  FLEXPEN) 100 UNIT/ML SOPN FlexPen   Subcutaneous   Inject 18 Units into the skin 3 (three) times daily with meals.         . insulin aspart (NOVOLOG) 100 UNIT/ML injection   Subcutaneous   Inject 15 Units into the skin 3 (three) times daily before meals.   1 vial   12   . insulin glargine (LANTUS) 100 UNIT/ML injection   Subcutaneous   Inject 45 Units into the skin at bedtime.          . Linaclotide (LINZESS) 145 MCG CAPS   Oral   Take 145 mcg by mouth daily as needed (pain).          . meloxicam (MOBIC) 7.5 MG tablet   Oral   Take 1 tablet (7.5 mg total) by mouth daily.   10 tablet   0   . metoCLOPramide (REGLAN) 5 MG tablet   Oral   Take 5 mg by mouth 3 (three) times daily. Frequency unspecified         . mycophenolate (MYFORTIC) 180 MG EC tablet   Oral   Take 540 mg by mouth 2 (two) times daily.         Marland Kitchen omeprazole (PRILOSEC) 40 MG capsule   Oral   Take 40 mg by mouth 2 (two) times daily.          . Oxycodone HCl 10 MG TABS   Oral   Take 1 tablet (10 mg total) by mouth 3 (three) times daily as needed. For pain   90 tablet   0   . predniSONE (DELTASONE) 10 MG tablet   Oral   Take 10 mg by mouth daily.         . tacrolimus (PROGRAF) 0.5 MG capsule   Oral   Take 3.5 mg by mouth 2 (two) times daily. Take with 1mg  capsules for total of 3.5mg  total dose twice daily         . tiotropium (SPIRIVA) 18 MCG inhalation capsule   Inhalation   Place 18 mcg into inhaler and inhale daily.         Marland Kitchen topiramate (TOPAMAX) 25 MG tablet   Oral   Take 100 mg by mouth daily with breakfast.          Triage Vitals: BP 115/64  Pulse  121  Temp(Src) 101.6 F (38.7 C)  Resp 28  Ht 5\' 3"  (1.6 m)  Wt 250 lb (113.399 kg)  BMI 44.30 kg/m2  SpO2 97%  LMP 01/30/2012 Physical Exam  Nursing note and vitals reviewed. Constitutional: She is oriented to person, place, and time. She appears well-developed and well-nourished. No distress.  HENT:  Head: Normocephalic and atraumatic.  Mouth/Throat: Oropharynx is clear and moist. No oropharyngeal exudate.  Eyes: EOM are normal.  Neck: Normal range of motion. Neck supple. No tracheal deviation present.  No meningismus.   Cardiovascular: Normal rate.   Pulmonary/Chest: Effort normal and breath sounds normal. No respiratory distress. She exhibits no tenderness.  Abdominal: Soft. Bowel sounds are normal. She exhibits no distension.  Mild left [name] quadrant tenderness. No CVA tenderness.   Genitourinary:  In the hairline, there is one small mildly tender area consistent with early abscess. No significant fluctuance. No cellulitis.   Musculoskeletal: Normal range of motion. She exhibits no tenderness.  Neurological: She is alert and oriented to person, place, and time.  Skin: Skin is warm and dry. No rash noted.  Psychiatric: She has a normal mood and  affect. Her behavior is normal.    ED Course  Procedures (including critical care time) DIAGNOSTIC STUDIES: Oxygen Saturation is 97% on room air, adequate by my interpretation.    COORDINATION OF CARE: 11:07 PM- Patient informed of current plan for treatment and evaluation and agrees with plan at this time.     Labs Review Labs Reviewed  URINALYSIS, ROUTINE W REFLEX MICROSCOPIC - Abnormal; Notable for the following:    APPearance HAZY (*)    Hgb urine dipstick TRACE (*)    Ketones, ur TRACE (*)    Leukocytes, UA SMALL (*)    All other components within normal limits  CBC WITH DIFFERENTIAL - Abnormal; Notable for the following:    WBC 15.5 (*)    Neutrophils Relative % 87 (*)    Neutro Abs 13.4 (*)    Lymphocytes  Relative 7 (*)    All other components within normal limits  BASIC METABOLIC PANEL - Abnormal; Notable for the following:    Sodium 133 (*)    Glucose, Bld 213 (*)    Creatinine, Ser 2.04 (*)    GFR calc non Af Amer 30 (*)    GFR calc Af Amer 34 (*)    All other components within normal limits  URINE MICROSCOPIC-ADD ON - Abnormal; Notable for the following:    Squamous Epithelial / LPF MANY (*)    Bacteria, UA MANY (*)    All other components within normal limits  URINE CULTURE   IV fluids. IV antibiotics. IV fentanyl and Zofran Tylenol prior to arrival - 8PM take 1500 mg  12:42 AM on recheck still has body aches and chills. No acute abdomen on exam. Discussed with Dr. Megan Salon will admit.  MDM  Diagnosis: Fever, UTI, elevated creatinine kidney transplant patient  treated with IV fluids, IV antibiotics, IV narcotics Evaluated with urinalysis and labs reviewed as above   I personally performed the services described in this documentation, which was scribed in my presence. The recorded information has been reviewed and is accurate.     Erin Lower, MD 11/20/13 [phone]

## 2013-11-19 NOTE — ED Notes (Signed)
Advised EDP that pt was requesting pain meds.

## 2013-11-20 DIAGNOSIS — N183 Chronic kidney disease, stage 3 unspecified: Secondary | ICD-10-CM

## 2013-11-20 DIAGNOSIS — N390 Urinary tract infection, site not specified: Secondary | ICD-10-CM

## 2013-11-20 DIAGNOSIS — D649 Anemia, unspecified: Secondary | ICD-10-CM

## 2013-11-20 DIAGNOSIS — K5090 Crohn's disease, unspecified, without complications: Secondary | ICD-10-CM

## 2013-11-20 LAB — COMPREHENSIVE METABOLIC PANEL
ALT: 7 U/L (ref 0–35)
AST: 8 U/L (ref 0–37)
Albumin: 3.4 g/dL — ABNORMAL LOW (ref 3.5–5.2)
Alkaline Phosphatase: 103 U/L (ref 39–117)
BUN: 20 mg/dL (ref 6–23)
CO2: 23 mEq/L (ref 19–32)
Calcium: 8.6 mg/dL (ref 8.4–10.5)
Chloride: 99 mEq/L (ref 96–112)
Creatinine, Ser: 1.97 mg/dL — ABNORMAL HIGH (ref 0.50–1.10)
GFR calc Af Amer: 36 mL/min — ABNORMAL LOW (ref >90)
GFR calc non Af Amer: 31 mL/min — ABNORMAL LOW (ref >90)
Glucose, Bld: 174 mg/dL — ABNORMAL HIGH (ref 70–99)
Potassium: 4.3 mEq/L (ref 3.5–5.1)
Sodium: 134 mEq/L — ABNORMAL LOW (ref 135–145)
Total Bilirubin: 0.3 mg/dL (ref 0.3–1.2)
Total Protein: 7.3 g/dL (ref 6.0–8.3)

## 2013-11-20 LAB — URINALYSIS, ROUTINE W REFLEX MICROSCOPIC
Bilirubin Urine: NEGATIVE
Glucose, UA: NEGATIVE mg/dL
Nitrite: NEGATIVE
Protein, ur: NEGATIVE mg/dL
Specific Gravity, Urine: 1.02 (ref 1.005–1.030)
Urobilinogen, UA: 0.2 mg/dL (ref 0.0–1.0)
pH: 6 (ref 5.0–8.0)

## 2013-11-20 LAB — BASIC METABOLIC PANEL
BUN: 20 mg/dL (ref 6–23)
CO2: 22 mEq/L (ref 19–32)
Calcium: 9 mg/dL (ref 8.4–10.5)
Chloride: 96 mEq/L (ref 96–112)
Creatinine, Ser: 2.04 mg/dL — ABNORMAL HIGH (ref 0.50–1.10)
GFR calc Af Amer: 34 mL/min — ABNORMAL LOW (ref >90)
GFR calc non Af Amer: 30 mL/min — ABNORMAL LOW (ref >90)
Glucose, Bld: 213 mg/dL — ABNORMAL HIGH (ref 70–99)
Potassium: 4.3 mEq/L (ref 3.5–5.1)
Sodium: 133 mEq/L — ABNORMAL LOW (ref 135–145)

## 2013-11-20 LAB — CBC WITH DIFFERENTIAL/PLATELET
Basophils Absolute: 0 10*3/uL (ref 0.0–0.1)
Basophils Relative: 0 % (ref 0–1)
Eosinophils Absolute: 0.1 10*3/uL (ref 0.0–0.7)
Eosinophils Relative: 0 % (ref 0–5)
HCT: 40.4 % (ref 36.0–46.0)
Hemoglobin: 13.2 g/dL (ref 12.0–15.0)
Lymphocytes Relative: 7 % — ABNORMAL LOW (ref 12–46)
Lymphs Abs: 1.1 10*3/uL (ref 0.7–4.0)
MCH: 27.3 pg (ref 26.0–34.0)
MCHC: 32.7 g/dL (ref 30.0–36.0)
MCV: 83.5 fL (ref 78.0–100.0)
Monocytes Absolute: 1 10*3/uL (ref 0.1–1.0)
Monocytes Relative: 6 % (ref 3–12)
Neutro Abs: 13.4 10*3/uL — ABNORMAL HIGH (ref 1.7–7.7)
Neutrophils Relative %: 87 % — ABNORMAL HIGH (ref 43–77)
Platelets: 292 10*3/uL (ref 150–400)
RBC: 4.84 MIL/uL (ref 3.87–5.11)
RDW: 13.7 % (ref 11.5–15.5)
WBC: 15.5 10*3/uL — ABNORMAL HIGH (ref 4.0–10.5)

## 2013-11-20 LAB — CBC
HCT: 37.2 % (ref 36.0–46.0)
Hemoglobin: 12 g/dL (ref 12.0–15.0)
MCH: 27 pg (ref 26.0–34.0)
MCHC: 32.3 g/dL (ref 30.0–36.0)
MCV: 83.6 fL (ref 78.0–100.0)
Platelets: 272 10*3/uL (ref 150–400)
RBC: 4.45 MIL/uL (ref 3.87–5.11)
RDW: 13.9 % (ref 11.5–15.5)
WBC: 13.3 10*3/uL — ABNORMAL HIGH (ref 4.0–10.5)

## 2013-11-20 LAB — URINE MICROSCOPIC-ADD ON

## 2013-11-20 LAB — TSH: TSH: 0.837 u[IU]/mL (ref 0.350–4.500)

## 2013-11-20 LAB — HEMOGLOBIN A1C
Hgb A1c MFr Bld: 9.8 % — ABNORMAL HIGH (ref <5.7)
Mean Plasma Glucose: 235 mg/dL — ABNORMAL HIGH (ref <117)

## 2013-11-20 LAB — GLUCOSE, CAPILLARY: Glucose-Capillary: 149 mg/dL — ABNORMAL HIGH (ref 70–99)

## 2013-11-20 MED ORDER — SODIUM CHLORIDE 0.9 % IV SOLN: INTRAVENOUS | Status: DC

## 2013-11-20 MED ORDER — METOCLOPRAMIDE HCL 10 MG PO TABS
5.0000 mg | ORAL_TABLET | Freq: Three times a day (TID) | ORAL | Status: DC
  Filled 2013-11-20: qty 1

## 2013-11-20 MED ORDER — SODIUM CHLORIDE 0.9 % IV SOLN
INTRAVENOUS | Status: AC
  Administered 2013-11-20: 02:00:00 via INTRAVENOUS

## 2013-11-20 MED ORDER — BUSPIRONE HCL 5 MG PO TABS: 15.0000 mg | ORAL_TABLET | Freq: Two times a day (BID) | ORAL | Status: DC

## 2013-11-20 MED ORDER — SORBITOL 70 % SOLN: 30.0000 mL | Freq: Every day | Status: DC | PRN

## 2013-11-20 MED ORDER — CALCITRIOL 0.25 MCG PO CAPS: 1.7500 ug | ORAL_CAPSULE | Freq: Every morning | ORAL | Status: DC

## 2013-11-20 MED ORDER — CEFPODOXIME PROXETIL 200 MG PO TABS: 200.0000 mg | ORAL_TABLET | Freq: Two times a day (BID) | ORAL | Status: DC

## 2013-11-20 MED ORDER — LORATADINE 10 MG PO TABS: 10.0000 mg | ORAL_TABLET | Freq: Every day | ORAL | Status: DC

## 2013-11-20 MED ORDER — TRAZODONE HCL 50 MG PO TABS: 25.0000 mg | ORAL_TABLET | Freq: Every evening | ORAL | Status: DC | PRN

## 2013-11-20 MED ORDER — CYCLOBENZAPRINE HCL 10 MG PO TABS: 10.0000 mg | ORAL_TABLET | Freq: Three times a day (TID) | ORAL | Status: DC | PRN

## 2013-11-20 MED ORDER — INSULIN GLARGINE 100 UNIT/ML ~~LOC~~ SOLN
45.0000 [IU] | Freq: Every day | SUBCUTANEOUS | Status: DC
  Filled 2013-11-20 (×3): qty 0.45

## 2013-11-20 MED ORDER — MELOXICAM 7.5 MG PO TABS
7.5000 mg | ORAL_TABLET | Freq: Every day | ORAL | Status: DC
  Filled 2013-11-20 (×4): qty 1

## 2013-11-20 MED ORDER — TOPIRAMATE 100 MG PO TABS
100.0000 mg | ORAL_TABLET | Freq: Every day | ORAL | Status: DC
  Filled 2013-11-20 (×4): qty 1

## 2013-11-20 MED ORDER — LINACLOTIDE 145 MCG PO CAPS
145.0000 ug | ORAL_CAPSULE | Freq: Every day | ORAL | Status: DC | PRN
Start: 2013-11-20 — End: 2013-11-20
  Filled 2013-11-20: qty 1

## 2013-11-20 MED ORDER — TACROLIMUS 1 MG PO CAPS
3.5000 mg | ORAL_CAPSULE | Freq: Two times a day (BID) | ORAL | Status: DC
  Filled 2013-11-20 (×7): qty 1

## 2013-11-20 MED ORDER — HYDROXYZINE HCL 25 MG PO TABS: 50.0000 mg | ORAL_TABLET | Freq: Every day | ORAL | Status: DC

## 2013-11-20 MED ORDER — ATORVASTATIN CALCIUM 20 MG PO TABS: 20.0000 mg | ORAL_TABLET | Freq: Every day | ORAL | Status: DC

## 2013-11-20 MED ORDER — ENOXAPARIN SODIUM 30 MG/0.3ML ~~LOC~~ SOLN: 30.0000 mg | SUBCUTANEOUS | Status: DC

## 2013-11-20 MED ORDER — FLEET ENEMA 7-19 GM/118ML RE ENEM: 1.0000 | ENEMA | Freq: Once | RECTAL | Status: DC | PRN

## 2013-11-20 MED ORDER — DEXTROSE 5 % IV SOLN
1.0000 g | Freq: Every day | INTRAVENOUS | Status: DC
  Filled 2013-11-20 (×3): qty 10

## 2013-11-20 MED ORDER — CILOSTAZOL 100 MG PO TABS
50.0000 mg | ORAL_TABLET | Freq: Two times a day (BID) | ORAL | Status: DC
  Filled 2013-11-20 (×6): qty 0.5
  Filled 2013-11-20: qty 1

## 2013-11-20 MED ORDER — ONDANSETRON HCL 4 MG/2ML IJ SOLN: 4.0000 mg | INTRAMUSCULAR | Status: DC | PRN

## 2013-11-20 MED ORDER — FLUOXETINE HCL 20 MG PO CAPS: 60.0000 mg | ORAL_CAPSULE | Freq: Every morning | ORAL | Status: DC

## 2013-11-20 MED ORDER — ACETAMINOPHEN 325 MG PO TABS
650.0000 mg | ORAL_TABLET | ORAL | Status: DC | PRN
  Administered 2013-11-20: 650 mg via ORAL
  Filled 2013-11-20: qty 2

## 2013-11-20 MED ORDER — PREDNISONE 10 MG PO TABS
10.0000 mg | ORAL_TABLET | Freq: Every day | ORAL | Status: DC
  Filled 2013-11-20: qty 1

## 2013-11-20 MED ORDER — MYCOPHENOLATE SODIUM 180 MG PO TBEC
540.0000 mg | DELAYED_RELEASE_TABLET | Freq: Two times a day (BID) | ORAL | Status: DC
  Filled 2013-11-20 (×7): qty 3

## 2013-11-20 MED ORDER — ENOXAPARIN SODIUM 40 MG/0.4ML ~~LOC~~ SOLN: 40.0000 mg | Freq: Every day | SUBCUTANEOUS | Status: DC

## 2013-11-20 MED ORDER — OXYCODONE HCL 5 MG PO TABS: 5.0000 mg | ORAL_TABLET | ORAL | Status: DC | PRN

## 2013-11-20 MED ORDER — CALCIUM CARBONATE 1250 (500 CA) MG PO TABS
1.0000 | ORAL_TABLET | Freq: Three times a day (TID) | ORAL | Status: DC
  Filled 2013-11-20: qty 1

## 2013-11-20 MED ORDER — DEXTROSE 5 % IV SOLN
1.0000 g | Freq: Once | INTRAVENOUS | Status: AC
  Administered 2013-11-20: 1 g via INTRAVENOUS
  Filled 2013-11-20: qty 10

## 2013-11-20 MED ORDER — PANTOPRAZOLE SODIUM 40 MG PO TBEC: 80.0000 mg | DELAYED_RELEASE_TABLET | Freq: Two times a day (BID) | ORAL | Status: DC

## 2013-11-20 NOTE — Progress Notes (Signed)
Patient complained of pain from UTI and was given tylenol as ordered, an hour later patient was still complaining of pain I paged the on-call physician and followed the orders given for oxycodone. Patient refused the PO medicine and requested IV pain medicine. Paged the on-call physician will follow orders given and continue to monitor.

## 2013-11-20 NOTE — Progress Notes (Signed)
Discharge instructions and prescription given, verbalized understanding, out in stable condition with staff via w/c.

## 2013-11-20 NOTE — Consult Note (Signed)
Reason for Consult: Worsening of her renal failure Referring Physician: [name] is an 37 y.o. female.  HPI: She's the patient was history of Crohn's disease, chronic renal failure status post kidney transplant x2 presently came with complaints of fever, chills. Patient also was complaining of having some urgency. When she was evaluated she was found to have slightly elevated BUN and creatinine above her baseline and  also possibly  urine tract infection hence admitted to the hospital for further workup and management.. Presently patient says that she's feeling much better. Still she has pain mainly on the left lower abdomen and also some left flank pain. Presently she denies any urgency or frequency. Patient denies any previous history of urine tract infection. Patient doesn't have any nausea or vomiting.  Past Medical History  Diagnosis Date  . Chronic anemia   . Panic disorder   . Crohn's disease   . Anxiety     OCD  . 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 Fracture 05/01/2013    Right foot, in cast  . Hypothyroidism     parathroid runs low  . History of blood transfusion 2 years ago  . Cancer 2001    renal cell , breast cancer both breast 2011, cervical cancer 2013  . Complication of anesthesia     hard to sedate dr hung aware  . PONV (postoperative nausea and vomiting)   . Renal disorder     S/p '11-Kidney transplant-Dr. Florene Glen follows    Past Surgical History  Procedure Laterality Date  . Nephrectomy transplanted organ  1999 and 2011    last '11-[facility]  . Parathyroidectomy  02-20-2001  . Knee surgery Left april 2003    arthroscopy  . Cystoscopy  02/13/2012    Procedure: CYSTOSCOPY;  Surgeon: Franchot Gallo, MD;  Location: [facility];  Service: Urology;  Laterality: N/A;  insertion of ureteral catheter , removed per Dr Diona Fanti   . Tubal ligation  02-19-2000  . Abdominal hysterectomy  02/13/2012    Procedure: HYSTERECTOMY ABDOMINAL;  Surgeon: Cyril Mourning, MD;  Location: [facility];  Service: Gynecology;  Laterality: N/A;  . Breast lumpectomy and reconstruction Bilateral 2011  . Esophageal manometry N/A 08/10/2013    Procedure: ESOPHAGEAL MANOMETRY (EM);  Surgeon: Beryle Beams, MD;  Location: [facility];  Service: Endoscopy;  Laterality: N/A;  . Incision and drainage abscess N/A 08/26/2013    Procedure: INCISION AND DRAINAGE PERIRECTAL ABSCESS;  Surgeon: Jamesetta So, MD;  Location: [facility];  Service: General;  Laterality: N/A;  . Esophagogastroduodenoscopy N/A 09/25/2013    Procedure: ESOPHAGOGASTRODUODENOSCOPY (EGD);  Surgeon: Beryle Beams, MD;  Location: [facility];  Service: Endoscopy;  Laterality: N/A;  . Colonoscopy N/A 09/25/2013    Procedure: COLONOSCOPY;  Surgeon: Beryle Beams, MD;  Location: [facility];  Service: Endoscopy;  Laterality: N/A;  . Groin dissection Left 10/09/2013    Procedure: INCISION AND DRAINAGE OF LEFT GROIN ABSCESS;  Surgeon: Shann Medal, MD;  Location: [facility];  Service: General;  Laterality: Left;  . Breast surgery      bilateral lumpectomy 4'11    No family history on file.  Social History:  reports that she has never smoked. She has never used smokeless tobacco. She reports that she does not drink alcohol or use illicit drugs.  Allergies:  Allergies  Allergen Reactions  . Barium-Containing Compounds Shortness Of Breath  . Dilaudid [Hydromorphone Hcl] Anaphylaxis  . Hydromorphone Shortness Of Breath  .  Ivp Dye [Iodinated Diagnostic Agents] Anaphylaxis  . Shellfish Allergy Shortness Of Breath and Itching    Causes red, patchy areas to form  . Vancomycin Anaphylaxis  . Adhesive [Tape] Other (See Comments)    Dermatitis   . Infed [Iron Dextran] Itching  . Sulfa Antibiotics Hives  . Iopamidol Itching and Rash    Medications: I have reviewed the patient's current medications.  Results for orders placed during the hospital encounter of 11/19/13 (from the past 48 hour(s))  CBC WITH  DIFFERENTIAL     Status: Abnormal   Collection Time    11/19/13 11:00 PM      Result Value Range   WBC 15.5 (*) 4.0 - 10.5 K/uL   RBC 4.84  3.87 - 5.11 MIL/uL   Hemoglobin 13.2  12.0 - 15.0 g/dL   HCT 40.4  36.0 - 46.0 %   MCV 83.5  78.0 - 100.0 fL   MCH 27.3  26.0 - 34.0 pg   MCHC 32.7  30.0 - 36.0 g/dL   RDW 13.7  11.5 - 15.5 %   Platelets 292  150 - 400 K/uL   Neutrophils Relative % 87 (*) 43 - 77 %   Neutro Abs 13.4 (*) 1.7 - 7.7 K/uL   Lymphocytes Relative 7 (*) 12 - 46 %   Lymphs Abs 1.1  0.7 - 4.0 K/uL   Monocytes Relative 6  3 - 12 %   Monocytes Absolute 1.0  0.1 - 1.0 K/uL   Eosinophils Relative 0  0 - 5 %   Eosinophils Absolute 0.1  0.0 - 0.7 K/uL   Basophils Relative 0  0 - 1 %   Basophils Absolute 0.0  0.0 - 0.1 K/uL  BASIC METABOLIC PANEL     Status: Abnormal   Collection Time    11/19/13 11:00 PM      Result Value Range   Sodium 133 (*) 135 - 145 mEq/L   Potassium 4.3  3.5 - 5.1 mEq/L   Chloride 96  96 - 112 mEq/L   CO2 22  19 - 32 mEq/L   Glucose, Bld 213 (*) 70 - 99 mg/dL   BUN 20  6 - 23 mg/dL   Creatinine, Ser 2.04 (*) 0.50 - 1.10 mg/dL   Calcium 9.0  8.4 - 10.5 mg/dL   GFR calc non Af Amer 30 (*) >90 mL/min   GFR calc Af Amer 34 (*) >90 mL/min   Comment: (NOTE)     The eGFR has been calculated using the CKD EPI equation.     This calculation has not been validated in all clinical situations.     eGFR's persistently <90 mL/min signify possible Chronic Kidney     Disease.  URINALYSIS, ROUTINE W REFLEX MICROSCOPIC     Status: Abnormal   Collection Time    11/19/13 11:10 PM      Result Value Range   Color, Urine YELLOW  YELLOW   APPearance HAZY (*) CLEAR   Specific Gravity, Urine 1.020  1.005 - 1.030   pH 6.0  5.0 - 8.0   Glucose, UA NEGATIVE  NEGATIVE mg/dL   Hgb urine dipstick TRACE (*) NEGATIVE   Bilirubin Urine NEGATIVE  NEGATIVE   Ketones, ur TRACE (*) NEGATIVE mg/dL   Protein, ur NEGATIVE  NEGATIVE mg/dL   Urobilinogen, UA 0.2  0.0 - 1.0  mg/dL   Nitrite NEGATIVE  NEGATIVE   Leukocytes, UA SMALL (*) NEGATIVE  URINE MICROSCOPIC-ADD ON     Status:  Abnormal   Collection Time    11/19/13 11:10 PM      Result Value Range   Squamous Epithelial / LPF MANY (*) RARE   WBC, UA TOO NUMEROUS TO COUNT  <3 WBC/hpf   RBC / HPF 0-2  <3 RBC/hpf   Bacteria, UA MANY (*) RARE  CBC     Status: Abnormal   Collection Time    11/20/13  5:43 AM      Result Value Range   WBC 13.3 (*) 4.0 - 10.5 K/uL   RBC 4.45  3.87 - 5.11 MIL/uL   Hemoglobin 12.0  12.0 - 15.0 g/dL   HCT 37.2  36.0 - 46.0 %   MCV 83.6  78.0 - 100.0 fL   MCH 27.0  26.0 - 34.0 pg   MCHC 32.3  30.0 - 36.0 g/dL   RDW 13.9  11.5 - 15.5 %   Platelets 272  150 - 400 K/uL  COMPREHENSIVE METABOLIC PANEL     Status: Abnormal   Collection Time    11/20/13  5:43 AM      Result Value Range   Sodium 134 (*) 135 - 145 mEq/L   Potassium 4.3  3.5 - 5.1 mEq/L   Chloride 99  96 - 112 mEq/L   CO2 23  19 - 32 mEq/L   Glucose, Bld 174 (*) 70 - 99 mg/dL   BUN 20  6 - 23 mg/dL   Creatinine, Ser 1.97 (*) 0.50 - 1.10 mg/dL   Calcium 8.6  8.4 - 10.5 mg/dL   Total Protein 7.3  6.0 - 8.3 g/dL   Albumin 3.4 (*) 3.5 - 5.2 g/dL   AST 8  0 - 37 U/L   ALT 7  0 - 35 U/L   Alkaline Phosphatase 103  39 - 117 U/L   Total Bilirubin 0.3  0.3 - 1.2 mg/dL   GFR calc non Af Amer 31 (*) >90 mL/min   GFR calc Af Amer 36 (*) >90 mL/min   Comment: (NOTE)     The eGFR has been calculated using the CKD EPI equation.     This calculation has not been validated in all clinical situations.     eGFR's persistently <90 mL/min signify possible Chronic Kidney     Disease.  GLUCOSE, CAPILLARY     Status: Abnormal   Collection Time    11/20/13  8:12 AM      Result Value Range   Glucose-Capillary 149 (*) 70 - 99 mg/dL    No results found.  Review of Systems  Constitutional: Positive for fever and chills.  Respiratory: Negative for shortness of breath.   Cardiovascular: Negative for orthopnea and leg  swelling.  Gastrointestinal: Positive for abdominal pain. Negative for nausea and vomiting.  Genitourinary: Positive for flank pain. Negative for urgency and frequency.   Blood pressure 109/73, pulse 85, temperature 98.6 F (37 C), temperature source Oral, resp. rate 22, height 5\' 3"  (1.6 m), weight 117.935 kg (260 lb), last menstrual period 01/30/2012, SpO2 95.00%. Physical Exam  Constitutional: She is oriented to person, place, and time. No distress.  Eyes: No scleral icterus.  Neck: No JVD present.  Cardiovascular: Normal rate and regular rhythm.   No murmur heard. Respiratory: She has no wheezes. She has no rales.  GI: She exhibits no distension. There is tenderness. There is no rebound and no guarding.  Musculoskeletal: She exhibits no edema.  Neurological: She is alert and oriented to person, place, and time.  Assessment/Plan: Problem #1 acute kidney injury superimposed on chronic. Her BUN and creatinine has this moment seems to be higher than her baseline. Her creatinine was 1.57 on 11/13/2010 after her second kidney transplant. Her creatinine remains elevated , but in 12 was  ranging between 1.49 to about 1.5. Her creatinine in  2013  Increased to  1.84 hence stage III. The present increasing BUN and creatinine could be secondary to prerenal syndrome/ATN/Prograf. Her renal function is improving. Problem #2 type 2 diabetes. Problem #3 history of Crohn's disease Problem #4 history of obstructive sleep apnea Problem #5 history of kidney transplant x2. Problem #6 hypertension her blood pressure seems to be reasonably controlled. Problem #7 fever. This could be secondary to UTI and patient presently on antibiotics. Her white blood cell count is improving.  Plan: We'll continue his hydration.          We'll check her basic metabolic panel and phosphorus in the morning.  Evian Derringer S 11/20/2013, 9:11 AM

## 2013-11-20 NOTE — H&P (Signed)
[facility] History and Physical  [name]  [id number]  DOB: [birth year]   DOA: 11/20/2013   PCP:   Odette Fraction, MD  Nephrologist: Dr. Erling Cruz  Chief Complaint:  Fever chills and abdominal pain since today  HPI: [name] is a 37 y.o. female.   Obese African American lady status post second kidney transplant, most recent in left lower quadrant has been having left lower quadrant pain, feeling cold all day ,then developed a fever later in the evening. In the emergency room left lower quadrant abdominal tenderness was confirmed and urinalysis suggested urinary tract infection.  She reports a baseline creatinine of 1.5  She denies any other symptoms Denies a history of urinary tract infection    Past Medical History  Diagnosis Date  . Chronic anemia   . Panic disorder   . Crohn's disease   . Anxiety     OCD  . 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 Fracture 05/01/2013    Right foot, in cast  . Hypothyroidism     parathroid runs low  . History of blood transfusion 2 years ago  . Cancer 2001    renal cell , breast cancer both breast 2011, cervical cancer 2013  . Complication of anesthesia     hard to sedate dr hung aware  . PONV (postoperative nausea and vomiting)   . Renal disorder     S/p '11-Kidney transplant-Dr. Florene Glen follows    Past Surgical History  Procedure Laterality Date  . Nephrectomy transplanted organ  1999 and 2011    last '11-[facility]  . Parathyroidectomy  02-20-2001  . Knee surgery Left april 2003    arthroscopy  . Cystoscopy  02/13/2012    Procedure: CYSTOSCOPY;  Surgeon: Franchot Gallo, MD;  Location: [facility];  Service: Urology;  Laterality: N/A;  insertion of ureteral catheter , removed per Dr Diona Fanti   . Tubal ligation  02-19-2000  . Abdominal hysterectomy  02/13/2012    Procedure: HYSTERECTOMY ABDOMINAL;  Surgeon: Cyril Mourning, MD;  Location: [facility];  Service: Gynecology;  Laterality: N/A;  . Breast  lumpectomy and reconstruction Bilateral 2011  . Esophageal manometry N/A 08/10/2013    Procedure: ESOPHAGEAL MANOMETRY (EM);  Surgeon: Beryle Beams, MD;  Location: [facility];  Service: Endoscopy;  Laterality: N/A;  . Incision and drainage abscess N/A 08/26/2013    Procedure: INCISION AND DRAINAGE PERIRECTAL ABSCESS;  Surgeon: Jamesetta So, MD;  Location: [facility];  Service: General;  Laterality: N/A;  . Esophagogastroduodenoscopy N/A 09/25/2013    Procedure: ESOPHAGOGASTRODUODENOSCOPY (EGD);  Surgeon: Beryle Beams, MD;  Location: [facility];  Service: Endoscopy;  Laterality: N/A;  . Colonoscopy N/A 09/25/2013    Procedure: COLONOSCOPY;  Surgeon: Beryle Beams, MD;  Location: [facility];  Service: Endoscopy;  Laterality: N/A;  . Groin dissection Left 10/09/2013    Procedure: INCISION AND DRAINAGE OF LEFT GROIN ABSCESS;  Surgeon: Shann Medal, MD;  Location: [facility];  Service: General;  Laterality: Left;  . Breast surgery      bilateral lumpectomy 4'11    Medications:  HOME MEDS: Prior to Admission medications   Medication Sig Start Date End Date Taking? Authorizing Provider  atorvastatin (LIPITOR) 20 MG tablet Take 20 mg by mouth at bedtime.   Yes Historical Provider, MD  busPIRone (BUSPAR) 15 MG tablet Take 15 mg by mouth 2 (two) times daily.  05/05/13  Yes Elmarie Shiley, NP  calcitRIOL (ROCALTROL) 0.25 MCG capsule Take  1.75 mcg by mouth every morning. 05/05/13  Yes Elmarie Shiley, NP  calcium carbonate (OS-CAL - DOSED IN MG OF ELEMENTAL CALCIUM) 1250 MG tablet Take 1 tablet by mouth 3 (three) times daily. 05/05/13  Yes Elmarie Shiley, NP  cetirizine (ZYRTEC) 10 MG tablet Take 10 mg by mouth every evening. 05/05/13  Yes Elmarie Shiley, NP  cilostazol (PLETAL) 50 MG tablet Take 50 mg by mouth 2 (two) times daily. 05/05/13  Yes Elmarie Shiley, NP  cyclobenzaprine (FLEXERIL) 10 MG tablet Take 1 tablet (10 mg total) by mouth 3 (three) times daily as needed for muscle spasms. 10/05/13  Yes Susy Frizzle, MD  FLUoxetine (PROZAC) 20 MG capsule Take 60 mg by mouth every morning.   Yes Historical Provider, MD  hydrOXYzine (ATARAX/VISTARIL) 25 MG tablet Take 50 mg by mouth at bedtime.    Yes Historical Provider, MD  insulin aspart (NOVOLOG FLEXPEN) 100 UNIT/ML SOPN FlexPen Inject 18 Units into the skin 3 (three) times daily with meals.   Yes Historical Provider, MD  insulin aspart (NOVOLOG) 100 UNIT/ML injection Inject 15 Units into the skin 3 (three) times daily before meals. 08/27/13  Yes Marianne L York, PA-C  insulin glargine (LANTUS) 100 UNIT/ML injection Inject 45 Units into the skin at bedtime.  05/15/13  Yes Susy Frizzle, MD  Linaclotide [facility]) 145 MCG CAPS Take 145 mcg by mouth daily as needed (pain).    Yes Historical Provider, MD  meloxicam (MOBIC) 7.5 MG tablet Take 1 tablet (7.5 mg total) by mouth daily. 10/18/13  Yes [place], NP  metoCLOPramide (REGLAN) 5 MG tablet Take 5 mg by mouth 3 (three) times daily. Frequency unspecified 05/05/13  Yes Elmarie Shiley, NP  mycophenolate (MYFORTIC) 180 MG EC tablet Take 540 mg by mouth 2 (two) times daily. 05/05/13  Yes Elmarie Shiley, NP  omeprazole (PRILOSEC) 40 MG capsule Take 40 mg by mouth 2 (two) times daily.    Yes Historical Provider, MD  Oxycodone HCl 10 MG TABS Take 1 tablet (10 mg total) by mouth 3 (three) times daily as needed. For pain 11/05/13  Yes Orlena Sheldon, PA-C  predniSONE (DELTASONE) 10 MG tablet Take 10 mg by mouth daily.   Yes Historical Provider, MD  tacrolimus (PROGRAF) 0.5 MG capsule Take 3.5 mg by mouth 2 (two) times daily. Take with 1mg  capsules for total of 3.5mg  total dose twice daily 05/05/13  Yes Elmarie Shiley, NP  tiotropium (SPIRIVA) 18 MCG inhalation capsule Place 18 mcg into inhaler and inhale daily.   Yes Historical Provider, MD  topiramate (TOPAMAX) 25 MG tablet Take 100 mg by mouth daily with breakfast. 10/05/13  Yes Susy Frizzle, MD     Allergies:  Allergies  Allergen Reactions  . Barium-Containing  Compounds Shortness Of Breath  . Dilaudid [Hydromorphone Hcl] Anaphylaxis  . Hydromorphone Shortness Of Breath  . Ivp Dye [Iodinated Diagnostic Agents] Anaphylaxis  . Shellfish Allergy Shortness Of Breath and Itching    Causes red, patchy areas to form  . Vancomycin Anaphylaxis  . Adhesive [Tape] Other (See Comments)    Dermatitis   . Infed [Iron Dextran] Itching  . Sulfa Antibiotics Hives  . Iopamidol Itching and Rash    Social History:   reports that she has never smoked. She has never used smokeless tobacco. She reports that she does not drink alcohol or use illicit drugs.  Family History: No family history on file.   Physical Exam: Filed Vitals:   11/19/13 2215 11/20/13 0150  BP: 115/64 121/78  Pulse: 121 118  Temp: 101.6 F (38.7 C) 102.9 F (39.4 C)  TempSrc:  Oral  Resp: 28 24  Height: 5\' 3"  (1.6 m)   Weight: 113.399 kg (250 lb)   SpO2: 97% 96%   Blood pressure 121/78, pulse 118, temperature 102.9 F (39.4 C), temperature source Oral, resp. rate 24, height 5\' 3"  (1.6 m), weight 113.399 kg (250 lb), last menstrual period 01/30/2012, SpO2 96.00%. Body mass index is 44.3 kg/(m^2).   GEN:  Pleasant young African American lying bed in no acute distress; cooperative with exam PSYCH:  alert and oriented x4; neither anxious nor depressed; affect is appropriate. HEENT: Mucous membranes pink and anicteric; PERRLA; EOM intact; no cervical lymphadenopathy nor thyromegaly or carotid bruit; no JVD; Breasts:: Not examined CHEST WALL: No tenderness CHEST: Normal respiration, clear to auscultation bilaterally HEART: Regular rate and rhythm; 2/6 systolic murmur; 1/6 diastolic BACK: No kyphosis no scoliosis; no CVA tenderness ABDOMEN: Obese, soft tender left lower quadrant; no masses, no organomegaly, normal abdominal bowel sounds;  Rectal Exam: Not done EXTREMITIES: No bone or joint deformity;  no edema; no ulcerations. Genitalia: not examined PULSES: 2+ and symmetric SKIN:  Normal hydration no rash or ulceration CNS: Cranial nerves 2-12 grossly intact no focal lateralizing neurologic deficit   Labs on Admission:  Basic Metabolic Panel:  Recent Labs Lab 11/19/13 2300  NA 133*  K 4.3  CL 96  CO2 22  GLUCOSE 213*  BUN 20  CREATININE 2.04*  CALCIUM 9.0   Liver Function Tests: No results found for this basename: AST, ALT, ALKPHOS, BILITOT, PROT, ALBUMIN,  in the last 168 hours No results found for this basename: LIPASE, AMYLASE,  in the last 168 hours No results found for this basename: AMMONIA,  in the last 168 hours CBC:  Recent Labs Lab 11/19/13 2300  WBC 15.5*  NEUTROABS 13.4*  HGB 13.2  HCT 40.4  MCV 83.5  PLT 292   Cardiac Enzymes: No results found for this basename: CKTOTAL, CKMB, CKMBINDEX, TROPONINI,  in the last 168 hours BNP: No components found with this basename: POCBNP,  D-dimer: No components found with this basename: D-DIMER,  CBG: No results found for this basename: GLUCAP,  in the last 168 hours  Radiological Exams on Admission: No results found.    Assessment/Plan  Active Problems:   H/o ESRD (end stage renal disease)   Kidney transplant status, living unrelated donor   Type 2 diabetes mellitus   HTN (hypertension)   Crohn's disease   Obstructive sleep apnea, adult   CKD (chronic kidney disease) stage 3, GFR 30-59 ml/min   UTI (lower urinary tract infection) ?Pyelo    PLAN: Continue intravenous antibiotics, pending definitive identification by urine culture; IV fluids Consult nephrology    Other plans as per orders.   Meko Masterson Nocturnist [facility] Pager [phone]   11/20/2013, 2:21 AM

## 2013-11-20 NOTE — ED Notes (Signed)
Pt has taken 1500 mg of Tylenol today.

## 2013-11-20 NOTE — Progress Notes (Signed)
When I originally offered the patient PO pain medicine she said she could not wait for the PO pain medicine to work and needed something by IV. I paged the on-call physician but did not get any IV pain medicine ordered. I offered the patient the PO pain medicine once again because it would have taken affect by now, but the patient still refuses and wants IV pain medicine. Will continue to monitor and follow orders as given.

## 2013-11-20 NOTE — Discharge Summary (Signed)
Physician Discharge Summary  [name] [id number] DOB: 06/05/1976 DOA: 11/19/2013  PCP: Odette Fraction, MD  Admit date: 11/19/2013 Discharge date: 11/20/2013  Time spent: 35 minutes  Recommendations for Outpatient Follow-up:  1. Follow up with PCP in 1-2 weeks 2. Would follow up with Dr. Florene Glen within one week 3. Please repeat renal panel within one week  Discharge Diagnoses:  Active Problems:   ESRD (end stage renal disease)   Kidney transplant status, living unrelated donor   Type 2 diabetes mellitus   HTN (hypertension)   Crohn's disease   Obstructive sleep apnea, adult   CKD (chronic kidney disease) stage 3, GFR 30-59 ml/min   UTI (lower urinary tract infection)   Discharge Condition: Improved  Diet recommendation: Diabetic  Filed Weights   11/19/13 2215 11/20/13 0554  Weight: 113.399 kg (250 lb) 117.935 kg (260 lb)    History of present illness:  [name] is a 37 y.o. female. Obese African American lady status post second kidney transplant, most recent in left lower quadrant has been having left lower quadrant pain, feeling cold all day ,then developed a fever later in the evening. In the emergency room left lower quadrant abdominal tenderness was confirmed and urinalysis suggested urinary tract infection.  Hospital Course:  Kidney transplant status, living unrelated donor  - Nephrology consulted given ARF on presentation  - Cont on immunosuppressives Acute Renal failure - Renal function improved with IVF - Nephrology recs for close outpatient follow up Type 2 diabetes mellitus  - On lantus 45 units  - Cont on SSI  HTN (hypertension)  - Remained stable  -Cont meds  Crohn's disease  - Appears stable  - Cont meds  Obstructive sleep apnea, adult  - Stable  Consultations:  Nephrology  Discharge Exam: Filed Vitals:   11/19/13 2215 11/20/13 0150 11/20/13 0554 11/20/13 1000  BP: 115/64 121/78 109/73 107/71  Pulse: 121 118 85 90   Temp: 101.6 F (38.7 C) 102.9 F (39.4 C) 98.6 F (37 C) 98.6 F (37 C)  TempSrc:  Oral Oral Oral  Resp: 28 24 22 21   Height: 5\' 3"  (1.6 m)     Weight: 113.399 kg (250 lb)  117.935 kg (260 lb)   SpO2: 97% 96% 95% 96%   General: Awake, in nad Cardiovascular: regular, s1, s2 Respiratory: normal resp effort, no wheezing  Discharge Instructions       Future Appointments Provider Department Dept Phone   11/27/2013 2:00 PM Susy Frizzle, MD [place] [phone]       Medication List         atorvastatin 20 MG tablet  Commonly known as:  LIPITOR  Take 20 mg by mouth at bedtime.     busPIRone 15 MG tablet  Commonly known as:  BUSPAR  Take 15 mg by mouth 2 (two) times daily.     calcitRIOL 0.25 MCG capsule  Commonly known as:  ROCALTROL  Take 1.75 mcg by mouth every morning.     calcium carbonate 1250 MG tablet  Commonly known as:  OS-CAL - dosed in mg of elemental calcium  Take 1 tablet by mouth 3 (three) times daily.     cefpodoxime 200 MG tablet  Commonly known as:  VANTIN  Take 1 tablet (200 mg total) by mouth 2 (two) times daily.     cetirizine 10 MG tablet  Commonly known as:  ZYRTEC  Take 10 mg by mouth every evening.     cilostazol 50  MG tablet  Commonly known as:  PLETAL  Take 50 mg by mouth 2 (two) times daily.     cyclobenzaprine 10 MG tablet  Commonly known as:  FLEXERIL  Take 1 tablet (10 mg total) by mouth 3 (three) times daily as needed for muscle spasms.     FLUoxetine 20 MG capsule  Commonly known as:  PROZAC  Take 60 mg by mouth every morning.     hydrOXYzine 25 MG tablet  Commonly known as:  ATARAX/VISTARIL  Take 50 mg by mouth at bedtime.     insulin aspart 100 UNIT/ML injection  Commonly known as:  novoLOG  Inject 15 Units into the skin 3 (three) times daily before meals.     NOVOLOG FLEXPEN 100 UNIT/ML Sopn FlexPen  Generic drug:  insulin aspart  Inject 18 Units into the skin 3 (three) times daily with  meals.     insulin glargine 100 UNIT/ML injection  Commonly known as:  LANTUS  Inject 45 Units into the skin at bedtime.     LINZESS 145 MCG Caps capsule  Generic drug:  Linaclotide  Take 145 mcg by mouth daily as needed (pain).     meloxicam 7.5 MG tablet  Commonly known as:  MOBIC  Take 1 tablet (7.5 mg total) by mouth daily.     metoCLOPramide 5 MG tablet  Commonly known as:  REGLAN  Take 5 mg by mouth 3 (three) times daily. Frequency unspecified     mycophenolate 180 MG EC tablet  Commonly known as:  MYFORTIC  Take 540 mg by mouth 2 (two) times daily.     omeprazole 40 MG capsule  Commonly known as:  PRILOSEC  Take 40 mg by mouth 2 (two) times daily.     Oxycodone HCl 10 MG Tabs  Take 1 tablet (10 mg total) by mouth 3 (three) times daily as needed. For pain     predniSONE 10 MG tablet  Commonly known as:  DELTASONE  Take 10 mg by mouth daily.     tacrolimus 0.5 MG capsule  Commonly known as:  PROGRAF  Take 3.5 mg by mouth 2 (two) times daily. Take with 1mg  capsules for total of 3.5mg  total dose twice daily     tiotropium 18 MCG inhalation capsule  Commonly known as:  SPIRIVA  Place 18 mcg into inhaler and inhale daily.     topiramate 25 MG tablet  Commonly known as:  TOPAMAX  Take 100 mg by mouth daily with breakfast.       Allergies  Allergen Reactions  . Barium-Containing Compounds Shortness Of Breath  . Dilaudid [Hydromorphone Hcl] Anaphylaxis  . Hydromorphone Shortness Of Breath  . Ivp Dye [Iodinated Diagnostic Agents] Anaphylaxis  . Shellfish Allergy Shortness Of Breath and Itching    Causes red, patchy areas to form  . Vancomycin Anaphylaxis  . Adhesive [Tape] Other (See Comments)    Dermatitis   . Infed [Iron Dextran] Itching  . Sulfa Antibiotics Hives  . Iopamidol Itching and Rash   Follow-up Information   Follow up with [facility] T[facility], MD On 11/27/2013. (at 2pm)    Specialty:  Family Medicine   Contact information:   [address]  [place] [address] [phone]       Follow up with Estanislado Emms, MD On 11/27/2013. (The office will call you with an appointment)    Specialty:  Nephrology   Contact information:   [address]  [place] [address] [phone]        The results of significant diagnostics from this hospitalization (including imaging, microbiology, ancillary and laboratory) are listed below for reference.    Significant Diagnostic Studies: No results found.  Microbiology: No results found for this or any previous visit (from the past 240 hour(s)).   Labs: Basic Metabolic Panel:  Recent Labs Lab 11/19/13 2300 11/20/13 0543  NA 133* 134*  K 4.3 4.3  CL 96 99  CO2 22 23  GLUCOSE 213* 174*  BUN 20 20  CREATININE 2.04* 1.97*  CALCIUM 9.0 8.6   Liver Function Tests:  Recent Labs Lab 11/20/13 0543  AST 8  ALT 7  ALKPHOS 103  BILITOT 0.3  PROT 7.3  ALBUMIN 3.4*   No results found for this basename: LIPASE, AMYLASE,  in the last 168 hours No results found for this basename: AMMONIA,  in the last 168 hours CBC:  Recent Labs Lab 11/19/13 2300 11/20/13 0543  WBC 15.5* 13.3*  NEUTROABS 13.4*  --   HGB 13.2 12.0  HCT 40.4 37.2  MCV 83.5 83.6  PLT 292 272   Cardiac Enzymes: No results found for this basename: CKTOTAL, CKMB, CKMBINDEX, TROPONINI,  in the last 168 hours BNP: BNP (last 3 results)  Recent Labs  09/11/13 1442  PROBNP 131.8*   CBG:  Recent Labs Lab 11/20/13 0812  GLUCAP 149*   Signed:  Ilma Achee K  [facility] 11/20/2013, 11:09 AM

## 2013-11-21 LAB — URINE CULTURE: Colony Count: 40000

## 2013-11-27 ENCOUNTER — Encounter (HOSPITAL_COMMUNITY): Payer: Medicare HMO | Admitting: Anesthesiology

## 2013-11-27 ENCOUNTER — Encounter (HOSPITAL_COMMUNITY)
Admission: RE | Disposition: A | Discharge status: Home / Self Care | Payer: Self-pay | Source: Ambulatory Visit | Attending: Gastroenterology

## 2013-11-27 ENCOUNTER — Ambulatory Visit (HOSPITAL_COMMUNITY)
Admission: RE | Admit: 2013-11-27 | Discharge: 2013-11-27 | Disposition: A | Discharge status: Home / Self Care | Payer: Medicare HMO | Source: Ambulatory Visit | Attending: Gastroenterology | Admitting: Gastroenterology

## 2013-11-27 ENCOUNTER — Encounter (HOSPITAL_COMMUNITY): Payer: Self-pay | Admitting: *Deleted

## 2013-11-27 ENCOUNTER — Inpatient Hospital Stay: Payer: Self-pay | Admitting: Family Medicine

## 2013-11-27 ENCOUNTER — Ambulatory Visit (HOSPITAL_COMMUNITY): Payer: Medicare HMO | Admitting: Anesthesiology

## 2013-11-27 DIAGNOSIS — E119 Type 2 diabetes mellitus without complications: Secondary | ICD-10-CM | POA: Insufficient documentation

## 2013-11-27 DIAGNOSIS — Z8541 Personal history of malignant neoplasm of cervix uteri: Secondary | ICD-10-CM | POA: Insufficient documentation

## 2013-11-27 DIAGNOSIS — Z940 Kidney transplant status: Secondary | ICD-10-CM | POA: Insufficient documentation

## 2013-11-27 DIAGNOSIS — K5090 Crohn's disease, unspecified, without complications: Secondary | ICD-10-CM | POA: Insufficient documentation

## 2013-11-27 DIAGNOSIS — K449 Diaphragmatic hernia without obstruction or gangrene: Secondary | ICD-10-CM | POA: Insufficient documentation

## 2013-11-27 DIAGNOSIS — G4733 Obstructive sleep apnea (adult) (pediatric): Secondary | ICD-10-CM | POA: Insufficient documentation

## 2013-11-27 DIAGNOSIS — K219 Gastro-esophageal reflux disease without esophagitis: Secondary | ICD-10-CM | POA: Insufficient documentation

## 2013-11-27 DIAGNOSIS — Z85528 Personal history of other malignant neoplasm of kidney: Secondary | ICD-10-CM | POA: Insufficient documentation

## 2013-11-27 DIAGNOSIS — E039 Hypothyroidism, unspecified: Secondary | ICD-10-CM | POA: Insufficient documentation

## 2013-11-27 DIAGNOSIS — Z853 Personal history of malignant neoplasm of breast: Secondary | ICD-10-CM | POA: Insufficient documentation

## 2013-11-27 HISTORY — PX: BRAVO PH STUDY: SHX5421

## 2013-11-27 HISTORY — PX: ESOPHAGOGASTRODUODENOSCOPY: SHX5428

## 2013-11-27 SURGERY — EGD (ESOPHAGOGASTRODUODENOSCOPY)
Anesthesia: Monitor Anesthesia Care

## 2013-11-27 MED ORDER — LACTATED RINGERS IV SOLN
INTRAVENOUS | Status: DC | PRN
  Administered 2013-11-27: 12:00:00 via INTRAVENOUS

## 2013-11-27 MED ORDER — PROPOFOL 10 MG/ML IV BOLUS
INTRAVENOUS | Status: AC
  Filled 2013-11-27: qty 20

## 2013-11-27 MED ORDER — PROPOFOL 10 MG/ML IV BOLUS
INTRAVENOUS | Status: DC | PRN
  Administered 2013-11-27: 150 mg via INTRAVENOUS

## 2013-11-27 MED ORDER — DIPHENHYDRAMINE HCL 50 MG/ML IJ SOLN
50.0000 mg | Freq: Once | INTRAMUSCULAR | Status: DC
  Filled 2013-11-27: qty 1

## 2013-11-27 MED ORDER — LIDOCAINE HCL (CARDIAC) 20 MG/ML IV SOLN
INTRAVENOUS | Status: AC
  Filled 2013-11-27: qty 10

## 2013-11-27 MED ORDER — DIPHENHYDRAMINE HCL 50 MG/ML IJ SOLN
INTRAMUSCULAR | Status: AC
  Filled 2013-11-27: qty 1

## 2013-11-27 MED ORDER — MIDAZOLAM HCL 2 MG/2ML IJ SOLN
INTRAMUSCULAR | Status: AC
  Filled 2013-11-27: qty 2

## 2013-11-27 MED ORDER — MIDAZOLAM HCL 5 MG/5ML IJ SOLN
INTRAMUSCULAR | Status: DC | PRN
  Administered 2013-11-27 (×2): 2 mg via INTRAVENOUS

## 2013-11-27 MED ORDER — KETOROLAC TROMETHAMINE 30 MG/ML IJ SOLN
INTRAMUSCULAR | Status: AC
  Filled 2013-11-27: qty 1

## 2013-11-27 MED ORDER — SODIUM CHLORIDE 0.9 % IV SOLN: INTRAVENOUS | Status: DC

## 2013-11-27 MED ORDER — KETAMINE HCL 50 MG/ML IJ SOLN
INTRAMUSCULAR | Status: AC
  Filled 2013-11-27: qty 10

## 2013-11-27 MED ORDER — PROPOFOL INFUSION 10 MG/ML OPTIME
INTRAVENOUS | Status: DC | PRN
  Administered 2013-11-27: 200 ug/kg/min via INTRAVENOUS

## 2013-11-27 MED ORDER — HYDROXYZINE HCL 50 MG/ML IM SOLN
25.0000 mg | Freq: Four times a day (QID) | INTRAMUSCULAR | Status: DC | PRN
  Filled 2013-11-27: qty 0.5

## 2013-11-27 MED ORDER — HYDROXYZINE HCL 25 MG PO TABS
25.0000 mg | ORAL_TABLET | Freq: Four times a day (QID) | ORAL | Status: DC | PRN
  Filled 2013-11-27: qty 1

## 2013-11-27 MED ORDER — KETAMINE HCL 10 MG/ML IJ SOLN
INTRAMUSCULAR | Status: DC | PRN
  Administered 2013-11-27: 10 mg via INTRAVENOUS
  Administered 2013-11-27: 20 mg via INTRAVENOUS
  Administered 2013-11-27: 50 mg via INTRAVENOUS
  Administered 2013-11-27: 20 mg via INTRAVENOUS

## 2013-11-27 MED ORDER — METHYLPREDNISOLONE SODIUM SUCC 125 MG IJ SOLR
125.0000 mg | Freq: Once | INTRAMUSCULAR | Status: DC
  Filled 2013-11-27: qty 2

## 2013-11-27 NOTE — Transfer of Care (Signed)
Immediate Anesthesia Transfer of Care Note  Patient: [name]  Procedure(s) Performed: Procedure(s): ESOPHAGOGASTRODUODENOSCOPY (EGD) (N/A) BRAVO PH STUDY (N/A)  Patient Location: PACU  Anesthesia Type:MAC  Level of Consciousness: awake  Airway & Oxygen Therapy: Patient Spontanous Breathing and Patient connected to nasal cannula oxygen  Post-op Assessment: Report given to PACU RN and Post -op Vital signs reviewed and stable  Post vital signs: Reviewed and stable  Complications: No apparent anesthesia complications

## 2013-11-27 NOTE — Anesthesia Preprocedure Evaluation (Signed)
Anesthesia Evaluation  Patient identified by MRN, date of birth, ID band Patient awake    Reviewed: Allergy & Precautions, H&P , NPO status , Patient's Chart, lab work & pertinent test results  Airway Mallampati: II TM Distance: <3 FB Neck ROM: Full    Dental no notable dental hx.    Pulmonary sleep apnea ,  breath sounds clear to auscultation  + decreased breath sounds      Cardiovascular hypertension, Pt. on medications Rhythm:Regular Rate:Normal     Neuro/Psych negative neurological ROS  negative psych ROS   GI/Hepatic negative GI ROS, Neg liver ROS,   Endo/Other  negative endocrine ROSdiabetes, Insulin DependentHypothyroidism Morbid obesity  Renal/GU ESRFRenal disease/p '11-Kidney transplant-Dr. Florene Glen follows  negative genitourinary   Musculoskeletal negative musculoskeletal ROS (+)   Abdominal   Peds negative pediatric ROS (+)  Hematology negative hematology ROS (+)   Anesthesia Other Findings   Reproductive/Obstetrics negative OB ROS                           Anesthesia Physical Anesthesia Plan  ASA: III  Anesthesia Plan: MAC   Post-op Pain Management:    Induction: Intravenous  Airway Management Planned: Nasal Cannula  Additional Equipment:   Intra-op Plan:   Post-operative Plan:   Informed Consent: I have reviewed the patients History and Physical, chart, labs and discussed the procedure including the risks, benefits and alternatives for the proposed anesthesia with the patient or authorized representative who has indicated his/her understanding and acceptance.   Dental advisory given  Plan Discussed with: CRNA and Surgeon  Anesthesia Plan Comments:         Anesthesia Quick Evaluation

## 2013-11-27 NOTE — Interval H&P Note (Signed)
History and Physical Interval Note:  11/27/2013 12:15 PM  [name]  has presented today for surgery, with the diagnosis of GERD  The various methods of treatment have been discussed with the patient and family. After consideration of risks, benefits and other options for treatment, the patient has consented to  Procedure(s): ESOPHAGOGASTRODUODENOSCOPY (EGD) (N/A) BRAVO PH STUDY (N/A) as a surgical intervention .  The patient's history has been reviewed, patient examined, no change in status, stable for surgery.  I have reviewed the patient's chart and labs.  Questions were answered to the patient's satisfaction.     Schawn Byas D

## 2013-11-27 NOTE — H&P (View-Only) (Signed)
Reason for Consult: Worsening of her renal failure Referring Physician: [name] is an 37 y.o. female.  HPI: She's the patient was history of Crohn's disease, chronic renal failure status post kidney transplant x2 presently came with complaints of fever, chills. Patient also was complaining of having some urgency. When she was evaluated she was found to have slightly elevated BUN and creatinine above her baseline and  also possibly  urine tract infection hence admitted to the hospital for further workup and management.. Presently patient says that she's feeling much better. Still she has pain mainly on the left lower abdomen and also some left flank pain. Presently she denies any urgency or frequency. Patient denies any previous history of urine tract infection. Patient doesn't have any nausea or vomiting.  Past Medical History  Diagnosis Date  . Chronic anemia   . Panic disorder   . Crohn's disease   . Anxiety     OCD  . 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 Fracture 05/01/2013    Right foot, in cast  . Hypothyroidism     parathroid runs low  . History of blood transfusion 2 years ago  . Cancer 2001    renal cell , breast cancer both breast 2011, cervical cancer 2013  . Complication of anesthesia     hard to sedate dr hung aware  . PONV (postoperative nausea and vomiting)   . Renal disorder     S/p '11-Kidney transplant-Dr. Florene Glen follows    Past Surgical History  Procedure Laterality Date  . Nephrectomy transplanted organ  1999 and 2011    last '11-[facility]  . Parathyroidectomy  02-20-2001  . Knee surgery Left april 2003    arthroscopy  . Cystoscopy  02/13/2012    Procedure: CYSTOSCOPY;  Surgeon: Franchot Gallo, MD;  Location: [facility];  Service: Urology;  Laterality: N/A;  insertion of ureteral catheter , removed per Dr Diona Fanti   . Tubal ligation  02-19-2000  . Abdominal hysterectomy  02/13/2012    Procedure: HYSTERECTOMY ABDOMINAL;  Surgeon: Cyril Mourning, MD;  Location: [facility];  Service: Gynecology;  Laterality: N/A;  . Breast lumpectomy and reconstruction Bilateral 2011  . Esophageal manometry N/A 08/10/2013    Procedure: ESOPHAGEAL MANOMETRY (EM);  Surgeon: Beryle Beams, MD;  Location: [facility];  Service: Endoscopy;  Laterality: N/A;  . Incision and drainage abscess N/A 08/26/2013    Procedure: INCISION AND DRAINAGE PERIRECTAL ABSCESS;  Surgeon: Jamesetta So, MD;  Location: [facility];  Service: General;  Laterality: N/A;  . Esophagogastroduodenoscopy N/A 09/25/2013    Procedure: ESOPHAGOGASTRODUODENOSCOPY (EGD);  Surgeon: Beryle Beams, MD;  Location: [facility];  Service: Endoscopy;  Laterality: N/A;  . Colonoscopy N/A 09/25/2013    Procedure: COLONOSCOPY;  Surgeon: Beryle Beams, MD;  Location: [facility];  Service: Endoscopy;  Laterality: N/A;  . Groin dissection Left 10/09/2013    Procedure: INCISION AND DRAINAGE OF LEFT GROIN ABSCESS;  Surgeon: Shann Medal, MD;  Location: [facility];  Service: General;  Laterality: Left;  . Breast surgery      bilateral lumpectomy 4'11    No family history on file.  Social History:  reports that she has never smoked. She has never used smokeless tobacco. She reports that she does not drink alcohol or use illicit drugs.  Allergies:  Allergies  Allergen Reactions  . Barium-Containing Compounds Shortness Of Breath  . Dilaudid [Hydromorphone Hcl] Anaphylaxis  . Hydromorphone Shortness Of Breath  .  Ivp Dye [Iodinated Diagnostic Agents] Anaphylaxis  . Shellfish Allergy Shortness Of Breath and Itching    Causes red, patchy areas to form  . Vancomycin Anaphylaxis  . Adhesive [Tape] Other (See Comments)    Dermatitis   . Infed [Iron Dextran] Itching  . Sulfa Antibiotics Hives  . Iopamidol Itching and Rash    Medications: I have reviewed the patient's current medications.  Results for orders placed during the hospital encounter of 11/19/13 (from the past 48 hour(s))  CBC WITH  DIFFERENTIAL     Status: Abnormal   Collection Time    11/19/13 11:00 PM      Result Value Range   WBC 15.5 (*) 4.0 - 10.5 K/uL   RBC 4.84  3.87 - 5.11 MIL/uL   Hemoglobin 13.2  12.0 - 15.0 g/dL   HCT 40.4  36.0 - 46.0 %   MCV 83.5  78.0 - 100.0 fL   MCH 27.3  26.0 - 34.0 pg   MCHC 32.7  30.0 - 36.0 g/dL   RDW 13.7  11.5 - 15.5 %   Platelets 292  150 - 400 K/uL   Neutrophils Relative % 87 (*) 43 - 77 %   Neutro Abs 13.4 (*) 1.7 - 7.7 K/uL   Lymphocytes Relative 7 (*) 12 - 46 %   Lymphs Abs 1.1  0.7 - 4.0 K/uL   Monocytes Relative 6  3 - 12 %   Monocytes Absolute 1.0  0.1 - 1.0 K/uL   Eosinophils Relative 0  0 - 5 %   Eosinophils Absolute 0.1  0.0 - 0.7 K/uL   Basophils Relative 0  0 - 1 %   Basophils Absolute 0.0  0.0 - 0.1 K/uL  BASIC METABOLIC PANEL     Status: Abnormal   Collection Time    11/19/13 11:00 PM      Result Value Range   Sodium 133 (*) 135 - 145 mEq/L   Potassium 4.3  3.5 - 5.1 mEq/L   Chloride 96  96 - 112 mEq/L   CO2 22  19 - 32 mEq/L   Glucose, Bld 213 (*) 70 - 99 mg/dL   BUN 20  6 - 23 mg/dL   Creatinine, Ser 2.04 (*) 0.50 - 1.10 mg/dL   Calcium 9.0  8.4 - 10.5 mg/dL   GFR calc non Af Amer 30 (*) >90 mL/min   GFR calc Af Amer 34 (*) >90 mL/min   Comment: (NOTE)     The eGFR has been calculated using the CKD EPI equation.     This calculation has not been validated in all clinical situations.     eGFR's persistently <90 mL/min signify possible Chronic Kidney     Disease.  URINALYSIS, ROUTINE W REFLEX MICROSCOPIC     Status: Abnormal   Collection Time    11/19/13 11:10 PM      Result Value Range   Color, Urine YELLOW  YELLOW   APPearance HAZY (*) CLEAR   Specific Gravity, Urine 1.020  1.005 - 1.030   pH 6.0  5.0 - 8.0   Glucose, UA NEGATIVE  NEGATIVE mg/dL   Hgb urine dipstick TRACE (*) NEGATIVE   Bilirubin Urine NEGATIVE  NEGATIVE   Ketones, ur TRACE (*) NEGATIVE mg/dL   Protein, ur NEGATIVE  NEGATIVE mg/dL   Urobilinogen, UA 0.2  0.0 - 1.0  mg/dL   Nitrite NEGATIVE  NEGATIVE   Leukocytes, UA SMALL (*) NEGATIVE  URINE MICROSCOPIC-ADD ON     Status:  Abnormal   Collection Time    11/19/13 11:10 PM      Result Value Range   Squamous Epithelial / LPF MANY (*) RARE   WBC, UA TOO NUMEROUS TO COUNT  <3 WBC/hpf   RBC / HPF 0-2  <3 RBC/hpf   Bacteria, UA MANY (*) RARE  CBC     Status: Abnormal   Collection Time    11/20/13  5:43 AM      Result Value Range   WBC 13.3 (*) 4.0 - 10.5 K/uL   RBC 4.45  3.87 - 5.11 MIL/uL   Hemoglobin 12.0  12.0 - 15.0 g/dL   HCT 37.2  36.0 - 46.0 %   MCV 83.6  78.0 - 100.0 fL   MCH 27.0  26.0 - 34.0 pg   MCHC 32.3  30.0 - 36.0 g/dL   RDW 13.9  11.5 - 15.5 %   Platelets 272  150 - 400 K/uL  COMPREHENSIVE METABOLIC PANEL     Status: Abnormal   Collection Time    11/20/13  5:43 AM      Result Value Range   Sodium 134 (*) 135 - 145 mEq/L   Potassium 4.3  3.5 - 5.1 mEq/L   Chloride 99  96 - 112 mEq/L   CO2 23  19 - 32 mEq/L   Glucose, Bld 174 (*) 70 - 99 mg/dL   BUN 20  6 - 23 mg/dL   Creatinine, Ser 1.97 (*) 0.50 - 1.10 mg/dL   Calcium 8.6  8.4 - 10.5 mg/dL   Total Protein 7.3  6.0 - 8.3 g/dL   Albumin 3.4 (*) 3.5 - 5.2 g/dL   AST 8  0 - 37 U/L   ALT 7  0 - 35 U/L   Alkaline Phosphatase 103  39 - 117 U/L   Total Bilirubin 0.3  0.3 - 1.2 mg/dL   GFR calc non Af Amer 31 (*) >90 mL/min   GFR calc Af Amer 36 (*) >90 mL/min   Comment: (NOTE)     The eGFR has been calculated using the CKD EPI equation.     This calculation has not been validated in all clinical situations.     eGFR's persistently <90 mL/min signify possible Chronic Kidney     Disease.  GLUCOSE, CAPILLARY     Status: Abnormal   Collection Time    11/20/13  8:12 AM      Result Value Range   Glucose-Capillary 149 (*) 70 - 99 mg/dL    No results found.  Review of Systems  Constitutional: Positive for fever and chills.  Respiratory: Negative for shortness of breath.   Cardiovascular: Negative for orthopnea and leg  swelling.  Gastrointestinal: Positive for abdominal pain. Negative for nausea and vomiting.  Genitourinary: Positive for flank pain. Negative for urgency and frequency.   Blood pressure 109/73, pulse 85, temperature 98.6 F (37 C), temperature source Oral, resp. rate 22, height 5\' 3"  (1.6 m), weight 117.935 kg (260 lb), last menstrual period 01/30/2012, SpO2 95.00%. Physical Exam  Constitutional: She is oriented to person, place, and time. No distress.  Eyes: No scleral icterus.  Neck: No JVD present.  Cardiovascular: Normal rate and regular rhythm.   No murmur heard. Respiratory: She has no wheezes. She has no rales.  GI: She exhibits no distension. There is tenderness. There is no rebound and no guarding.  Musculoskeletal: She exhibits no edema.  Neurological: She is alert and oriented to person, place, and time.  Assessment/Plan: Problem #1 acute kidney injury superimposed on chronic. Her BUN and creatinine has this moment seems to be higher than her baseline. Her creatinine was 1.57 on 11/13/2010 after her second kidney transplant. Her creatinine remains elevated , but in 12 was  ranging between 1.49 to about 1.5. Her creatinine in  2013  Increased to  1.84 hence stage III. The present increasing BUN and creatinine could be secondary to prerenal syndrome/ATN/Prograf. Her renal function is improving. Problem #2 type 2 diabetes. Problem #3 history of Crohn's disease Problem #4 history of obstructive sleep apnea Problem #5 history of kidney transplant x2. Problem #6 hypertension her blood pressure seems to be reasonably controlled. Problem #7 fever. This could be secondary to UTI and patient presently on antibiotics. Her white blood cell count is improving.  Plan: We'll continue his hydration.          We'll check her basic metabolic panel and phosphorus in the morning.  [name] S 11/20/2013, 9:11 AM

## 2013-11-27 NOTE — Preoperative (Signed)
Beta Blockers   Reason not to administer Beta Blockers:Not Applicable 

## 2013-11-27 NOTE — Op Note (Signed)
[facility] [place], [address]   OPERATIVE PROCEDURE REPORT  PATIENT: [name], [name]  MR#: [id number] BIRTHDATE: July 15, 1976  GENDER: Female ENDOSCOPIST: Carol Ada, MD ASSISTANT:   Mahala Menghini, technician and Luanne Bras, RN CGRN PROCEDURE DATE: 11/27/2013 PROCEDURE:   EGD w/ Bravo capsule placement ASA CLASS:   Class III INDICATIONS: GERD MEDICATIONS: MAC sedation, administered by CRNA TOPICAL ANESTHETIC:   none  DESCRIPTION OF PROCEDURE:   After the risks benefits and alternatives of the procedure were thoroughly explained, informed consent was obtained.  The [place] [id number]  endoscope was introduced through the mouth  and advanced to the second portion of the duodenum Without limitations.      The instrument was slowly withdrawn as the mucosa was fully examined.      FINDINGS: The esophagus demonstrated a 4 cm hiatal hernia.  An adequate image was not obtained as sedation was very difficult.  No other abnormalities were found in the upper GI tract.  The Z-line was measured to be at 36 cm and the BRAVO probe was subsequently placed at 30 cm without complication.          The scope was then withdrawn from the patient and the procedure terminated.  COMPLICATIONS: There were no complications. IMPRESSION: 1) 4 cm hiatal hernia.  RECOMMENDATIONS: 1) Await BRAVO result.  _______________________________ eSignedCarol Ada, MD 11/27/2013 1:16 PM

## 2013-11-27 NOTE — Anesthesia Postprocedure Evaluation (Signed)
  Anesthesia Post-op Note  Patient: [name]  Procedure(s) Performed: Procedure(s) (LRB): ESOPHAGOGASTRODUODENOSCOPY (EGD) (N/A) BRAVO PH STUDY (N/A)  Patient Location: PACU  Anesthesia Type: MAC  Level of Consciousness: awake and alert   Airway and Oxygen Therapy: Patient Spontanous Breathing  Post-op Pain: mild  Post-op Assessment: Post-op Vital signs reviewed, Patient's Cardiovascular Status Stable, Respiratory Function Stable, Patent Airway and No signs of Nausea or vomiting  Last Vitals:  Filed Vitals:   11/27/13 1056  BP: 131/80  Temp: 36.7 C  Resp: 10    Post-op Vital Signs: stable   Complications: No apparent anesthesia complications

## 2013-11-27 NOTE — Progress Notes (Addendum)
Pt appears very anxiuos stating ,"I am itching all over and I can not see. Pt stated she has obessive compulsions and itching is one of her problems. Pt is fixated on small things. Pt was given 25mg  of benedryl IV and 125mg  of solumedrol IV per Dr. Benson Norway.Vital signs remain stable. Pt does not appear in respiratory distress. Sat's are between 97-98%. Pt remains on 4 liters nasal canula. No facial edema and no tongue swelling noted. Pt c/o pain with swallowing. MD made aware.

## 2013-11-30 ENCOUNTER — Encounter (HOSPITAL_COMMUNITY): Payer: Self-pay | Admitting: Gastroenterology

## 2013-12-02 ENCOUNTER — Encounter (HOSPITAL_COMMUNITY): Payer: Self-pay | Admitting: *Deleted

## 2013-12-02 ENCOUNTER — Telehealth: Payer: Self-pay | Admitting: Family Medicine

## 2013-12-02 ENCOUNTER — Other Ambulatory Visit: Payer: Self-pay | Admitting: Gastroenterology

## 2013-12-02 NOTE — Telephone Encounter (Signed)
?   OK to Refill  

## 2013-12-02 NOTE — Telephone Encounter (Signed)
Needs Oxycodone RX

## 2013-12-03 ENCOUNTER — Other Ambulatory Visit: Payer: Self-pay | Admitting: Family Medicine

## 2013-12-03 ENCOUNTER — Encounter (HOSPITAL_COMMUNITY): Payer: Self-pay | Admitting: Pharmacy Technician

## 2013-12-03 NOTE — Telephone Encounter (Signed)
[place] for TID oxycodone not QID.

## 2013-12-04 MED ORDER — OXYCODONE HCL 10 MG PO TABS: 10.0000 mg | ORAL_TABLET | Freq: Three times a day (TID) | ORAL | Status: DC | PRN

## 2013-12-04 NOTE — Telephone Encounter (Signed)
RX printed, left up front and patient aware to pick up  

## 2013-12-11 ENCOUNTER — Ambulatory Visit (HOSPITAL_COMMUNITY)
Admission: RE | Admit: 2013-12-11 | Discharge: 2013-12-11 | Disposition: A | Discharge status: Home / Self Care | Payer: Medicare HMO | Source: Ambulatory Visit | Attending: Gastroenterology | Admitting: Gastroenterology

## 2013-12-11 ENCOUNTER — Encounter (HOSPITAL_COMMUNITY)
Admission: RE | Disposition: A | Discharge status: Home / Self Care | Payer: Self-pay | Source: Ambulatory Visit | Attending: Gastroenterology

## 2013-12-11 ENCOUNTER — Encounter (HOSPITAL_COMMUNITY): Payer: Self-pay

## 2013-12-11 ENCOUNTER — Encounter (HOSPITAL_COMMUNITY): Payer: Medicare HMO | Admitting: Anesthesiology

## 2013-12-11 ENCOUNTER — Ambulatory Visit (HOSPITAL_COMMUNITY): Payer: Medicare HMO | Admitting: Anesthesiology

## 2013-12-11 DIAGNOSIS — E119 Type 2 diabetes mellitus without complications: Secondary | ICD-10-CM | POA: Insufficient documentation

## 2013-12-11 DIAGNOSIS — IMO0002 Reserved for concepts with insufficient information to code with codable children: Secondary | ICD-10-CM | POA: Insufficient documentation

## 2013-12-11 DIAGNOSIS — Z794 Long term (current) use of insulin: Secondary | ICD-10-CM | POA: Insufficient documentation

## 2013-12-11 DIAGNOSIS — K449 Diaphragmatic hernia without obstruction or gangrene: Secondary | ICD-10-CM | POA: Insufficient documentation

## 2013-12-11 DIAGNOSIS — K219 Gastro-esophageal reflux disease without esophagitis: Secondary | ICD-10-CM | POA: Insufficient documentation

## 2013-12-11 DIAGNOSIS — L299 Pruritus, unspecified: Secondary | ICD-10-CM | POA: Insufficient documentation

## 2013-12-11 DIAGNOSIS — Z940 Kidney transplant status: Secondary | ICD-10-CM | POA: Insufficient documentation

## 2013-12-11 DIAGNOSIS — G4730 Sleep apnea, unspecified: Secondary | ICD-10-CM | POA: Insufficient documentation

## 2013-12-11 DIAGNOSIS — I10 Essential (primary) hypertension: Secondary | ICD-10-CM | POA: Insufficient documentation

## 2013-12-11 HISTORY — PX: ESOPHAGOGASTRODUODENOSCOPY: SHX5428

## 2013-12-11 HISTORY — PX: BRAVO PH STUDY: SHX5421

## 2013-12-11 SURGERY — EGD (ESOPHAGOGASTRODUODENOSCOPY)
Anesthesia: Monitor Anesthesia Care

## 2013-12-11 MED ORDER — MIDAZOLAM HCL 5 MG/5ML IJ SOLN
INTRAMUSCULAR | Status: DC | PRN
  Administered 2013-12-11: 2 mg via INTRAVENOUS

## 2013-12-11 MED ORDER — PROPOFOL 10 MG/ML IV BOLUS
INTRAVENOUS | Status: AC
  Filled 2013-12-11: qty 20

## 2013-12-11 MED ORDER — KETAMINE HCL 10 MG/ML IJ SOLN
INTRAMUSCULAR | Status: DC | PRN
  Administered 2013-12-11 (×2): 20 mg via INTRAVENOUS
  Administered 2013-12-11: 10 mg via INTRAVENOUS

## 2013-12-11 MED ORDER — DIPHENHYDRAMINE HCL 50 MG/ML IJ SOLN
INTRAMUSCULAR | Status: AC
  Filled 2013-12-11: qty 1

## 2013-12-11 MED ORDER — PROPOFOL INFUSION 10 MG/ML OPTIME
INTRAVENOUS | Status: DC | PRN
  Administered 2013-12-11: 160 ug/kg/min via INTRAVENOUS

## 2013-12-11 MED ORDER — SODIUM CHLORIDE 0.9 % IV SOLN: INTRAVENOUS | Status: DC

## 2013-12-11 MED ORDER — LIDOCAINE HCL 1 % IJ SOLN
INTRAMUSCULAR | Status: AC
  Filled 2013-12-11: qty 20

## 2013-12-11 MED ORDER — LACTATED RINGERS IV SOLN
INTRAVENOUS | Status: DC | PRN
  Administered 2013-12-11: 10:00:00 via INTRAVENOUS

## 2013-12-11 MED ORDER — PROMETHAZINE HCL 25 MG/ML IJ SOLN: 6.2500 mg | INTRAMUSCULAR | Status: DC | PRN

## 2013-12-11 MED ORDER — KETAMINE HCL 50 MG/ML IJ SOLN
INTRAMUSCULAR | Status: AC
  Filled 2013-12-11: qty 10

## 2013-12-11 MED ORDER — MIDAZOLAM HCL 2 MG/2ML IJ SOLN
INTRAMUSCULAR | Status: AC
  Filled 2013-12-11: qty 2

## 2013-12-11 MED ORDER — DIPHENHYDRAMINE HCL 50 MG/ML IJ SOLN
50.0000 mg | Freq: Once | INTRAMUSCULAR | Status: AC
  Administered 2013-12-11: 50 mg via INTRAVENOUS

## 2013-12-11 NOTE — Anesthesia Postprocedure Evaluation (Signed)
  Anesthesia Post-op Note  Patient: [name]  Procedure(s) Performed: Procedure(s) (LRB): ESOPHAGOGASTRODUODENOSCOPY (EGD) (N/A) BRAVO PH STUDY (N/A)  Patient Location: PACU  Anesthesia Type: MAC  Level of Consciousness: awake and alert   Airway and Oxygen Therapy: Patient Spontanous Breathing  Post-op Pain: mild  Post-op Assessment: Post-op Vital signs reviewed, Patient's Cardiovascular Status Stable, Respiratory Function Stable, Patent Airway and No signs of Nausea or vomiting  Last Vitals:  Filed Vitals:   12/11/13 1027  BP: 115/45  Pulse:   Temp: 36.8 C  Resp: 15    Post-op Vital Signs: stable   Complications: No apparent anesthesia complications

## 2013-12-11 NOTE — H&P (View-Only) (Signed)
Pt appears very anxiuos stating ,"I am itching all over and I can not see. Pt stated she has obessive compulsions and itching is one of her problems. Pt is fixated on small things. Pt was given 25mg  of benedryl IV and 125mg  of solumedrol IV per Dr. Benson [place].Vital signs remain stable. Pt does not appear in respiratory distress. Sat's are between 97-98%. Pt remains on 4 liters nasal canula. No facial edema and no tongue swelling noted. Pt c/o pain with swallowing. MD made aware.

## 2013-12-11 NOTE — Anesthesia Preprocedure Evaluation (Addendum)
Anesthesia Evaluation  Patient identified by MRN, date of birth, ID band Patient awake    Reviewed: Allergy & Precautions, H&P , NPO status , Patient's Chart, lab work & pertinent test results  History of Anesthesia Complications (+) PONV  Airway Mallampati: III TM Distance: >3 FB Neck ROM: full    Dental no notable dental hx. (+) Teeth Intact and Dental Advisory Given   Pulmonary sleep apnea ,  breath sounds clear to auscultation  Pulmonary exam normal       Cardiovascular Exercise Tolerance: Good hypertension, Pt. on medications Rhythm:regular Rate:Normal     Neuro/Psych Anxiety Depression negative neurological ROS  negative psych ROS   GI/Hepatic negative GI ROS, Neg liver ROS,   Endo/Other  diabetes, Well Controlled, Type 2, Insulin DependentHypothyroidism Morbid obesity  Renal/GU Renal diseaseKidney transplant with stage 3 kidney disease  negative genitourinary   Musculoskeletal   Abdominal (+) + obese,   Peds  Hematology negative hematology ROS (+)   Anesthesia Other Findings Chronic steroid use  Reproductive/Obstetrics negative OB ROS                         Anesthesia Physical Anesthesia Plan  ASA: III  Anesthesia Plan: MAC   Post-op Pain Management:    Induction:   Airway Management Planned:   Additional Equipment:   Intra-op Plan:   Post-operative Plan:   Informed Consent: I have reviewed the patients History and Physical, chart, labs and discussed the procedure including the risks, benefits and alternatives for the proposed anesthesia with the patient or authorized representative who has indicated his/her understanding and acceptance.   Dental Advisory Given  Plan Discussed with: CRNA and Surgeon  Anesthesia Plan Comments:         Anesthesia Quick Evaluation

## 2013-12-11 NOTE — Transfer of Care (Signed)
Immediate Anesthesia Transfer of Care Note  Patient: [name]  Procedure(s) Performed: Procedure(s): ESOPHAGOGASTRODUODENOSCOPY (EGD) (N/A) BRAVO PH STUDY (N/A)  Patient Location: PACU and Endoscopy Unit  Anesthesia Type:MAC  Level of Consciousness: awake, alert  and patient cooperative  Airway & Oxygen Therapy: Patient Spontanous Breathing and Patient connected to nasal cannula oxygen  Post-op Assessment: Report given to PACU RN, Post -op Vital signs reviewed and stable and Patient moving all extremities X 4  Post vital signs: Reviewed and stable  Complications: No apparent anesthesia complications

## 2013-12-11 NOTE — Progress Notes (Signed)
Patient was complaining of itching in her eyes and feet. MD notified and 50mg  of Benadryl IV ordered and administered. Will re-evaluate effectiveness. Patient was originally hallucinating following procedure but now has become more oriented and aware of situation. Answering appropriately.

## 2013-12-11 NOTE — Interval H&P Note (Signed)
History and Physical Interval Note:  12/11/2013 9:38 AM  [name]  has presented today for surgery, with the diagnosis of GERD  The various methods of treatment have been discussed with the patient and family. After consideration of risks, benefits and other options for treatment, the patient has consented to  Procedure(s): ESOPHAGOGASTRODUODENOSCOPY (EGD) (N/A) BRAVO PH STUDY (N/A) as a surgical intervention .  The patient's history has been reviewed, patient examined, no change in status, stable for surgery.  I have reviewed the patient's chart and labs.  Questions were answered to the patient's satisfaction.     Sayvion Vigen D

## 2013-12-11 NOTE — Op Note (Signed)
[place], [address]   OPERATIVE PROCEDURE REPORT  PATIENT: Schronda, Blicharz  MR#: [id number] BIRTHDATE: 1976-04-12  GENDER: Female ENDOSCOPIST: Carol Ada, MD ASSISTANT:   Sharon Mt, Endo Technician, Ward Chatters, RN, BSN, and Corliss Parish, technician PROCEDURE DATE: 12/11/2013 PROCEDURE:   EGD with Bravo placement ASA CLASS:   Class III INDICATIONS:GERD MEDICATIONS: MAC, CRNA TOPICAL ANESTHETIC:   None  DESCRIPTION OF PROCEDURE:   After the risks benefits and alternatives of the procedure were thoroughly explained, informed consent was obtained.  The     endoscope was introduced through the mouth  and advanced to the second portion of the duodenum Without limitations.      The instrument was slowly withdrawn as the mucosa was fully examined.      FINDINGS: The 4 cm hiatal hernia was again identified.  No other upper GI abnormalities noted.  Successful BRAVO placement was achieved at 30 cm.  The Z-line was measured at 36 cm.  This second BRAVO was placed as technical failures precluded obtaining results..          The scope was then withdrawn from the patient and the procedure terminated.  COMPLICATIONS: There were no complications.  IMPRESSION: 1) 4 cm hiatal hernia.  RECOMMENDATIONS: 1) Await BRAVO results.  _______________________________ eSignedCarol Ada, MD 12/11/2013 10:25 AM

## 2013-12-14 ENCOUNTER — Encounter (HOSPITAL_COMMUNITY): Payer: Self-pay | Admitting: Gastroenterology

## 2013-12-25 ENCOUNTER — Other Ambulatory Visit: Payer: Self-pay | Admitting: *Deleted

## 2013-12-25 MED ORDER — CILOSTAZOL 50 MG PO TABS: 50.0000 mg | ORAL_TABLET | Freq: Two times a day (BID) | ORAL | Status: DC

## 2013-12-25 NOTE — Telephone Encounter (Signed)
Rx was sent to pharmacy electronically. 

## 2013-12-31 ENCOUNTER — Telehealth: Payer: Self-pay | Admitting: Family Medicine

## 2013-12-31 NOTE — Telephone Encounter (Signed)
?   OK to Refill  

## 2013-12-31 NOTE — Telephone Encounter (Signed)
Pt is needing oxycodone refilled  Call back [phone]

## 2014-01-01 NOTE — Telephone Encounter (Signed)
Denied, NTBS, has dnka'd last 2 OV.

## 2014-01-06 NOTE — Telephone Encounter (Signed)
Has appt 01/07/14

## 2014-01-07 ENCOUNTER — Ambulatory Visit (INDEPENDENT_AMBULATORY_CARE_PROVIDER_SITE_OTHER): Payer: Medicare HMO | Admitting: Family Medicine

## 2014-01-07 ENCOUNTER — Encounter: Payer: Self-pay | Admitting: Family Medicine

## 2014-01-07 VITALS — BP 100/60 | HR 80 | Temp 99.1°F | Resp 18 | Ht 63.0 in | Wt 258.0 lb

## 2014-01-07 DIAGNOSIS — Z23 Encounter for immunization: Secondary | ICD-10-CM

## 2014-01-07 DIAGNOSIS — Z7189 Other specified counseling: Secondary | ICD-10-CM

## 2014-01-07 DIAGNOSIS — G8929 Other chronic pain: Secondary | ICD-10-CM

## 2014-01-07 MED ORDER — OXYCODONE HCL 10 MG PO TABS: 10.0000 mg | ORAL_TABLET | Freq: Three times a day (TID) | ORAL | Status: DC | PRN

## 2014-01-07 NOTE — Addendum Note (Signed)
Addended by: Sharmon Revere on: 01/07/2014 04:18 PM   Modules accepted: Orders

## 2014-01-07 NOTE — Progress Notes (Signed)
Subjective:    Patient ID: [name], female    DOB: 1976/06/13, 38 y.o.   MRN: [id number]  HPI Patient currently takes oxycodone 10 mg by mouth 3 times a day for chronic low back pain, chronic pain in her feet from neuropathy and previous surgeries on her toes, pain due to osteoarthritis in the knee.  She is been on 10 mg of oxycodone for several years. There is no evidence of abuse or diversion. She is due today for a urine drug screen. She is also due for Prevnar 13. She has a history of Pneumovax 23 2 years ago. She is a high-risk patient due to her history of kidney transplant, chronic kidney disease, and diabetes. Currently her diabetes management her and her cardiologist. She is on Lantus 45 units subcutaneous daily along with NovoLog 15 units with meals correction factor. Her blood pressure today is well controlled. Past Medical History  Diagnosis Date  . Chronic anemia   . Panic disorder   . Crohn's disease   . Anxiety     OCD  . 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 Fracture 05/01/2013    Right foot, in cast  . Hypothyroidism     parathroid runs low  . History of blood transfusion 2 years ago  . Cancer 2001    renal cell , breast cancer both breast 2011, cervical cancer 2013  . Complication of anesthesia     hard to sedate dr hung aware  . PONV (postoperative nausea and vomiting)   . Renal disorder     S/p '11-Kidney transplant-Dr. Florene Glen follows   Current Outpatient Prescriptions on File Prior to Visit  Medication Sig Dispense Refill  . atorvastatin (LIPITOR) 20 MG tablet Take 20 mg by mouth at bedtime.      . busPIRone (BUSPAR) 15 MG tablet Take 15 mg by mouth 2 (two) times daily.       . calcitRIOL (ROCALTROL) 0.25 MCG capsule Take 1.75 mcg by mouth every morning.      . calcium carbonate (OS-CAL - DOSED IN MG OF ELEMENTAL CALCIUM) 1250 MG tablet Take 1 tablet by mouth 3 (three) times daily.      . cetirizine (ZYRTEC) 10 MG tablet Take 10 mg by mouth every  evening.      . cilostazol (PLETAL) 50 MG tablet Take 1 tablet (50 mg total) by mouth 2 (two) times daily.  60 tablet  0  . cyclobenzaprine (FLEXERIL) 10 MG tablet Take 1 tablet (10 mg total) by mouth 3 (three) times daily as needed for muscle spasms.  60 tablet  0  . FLUoxetine (PROZAC) 20 MG capsule Take 60 mg by mouth every morning.      . hydrOXYzine (ATARAX/VISTARIL) 25 MG tablet Take 50 mg by mouth at bedtime.       . insulin aspart (NOVOLOG FLEXPEN) 100 UNIT/ML SOPN FlexPen Inject 15 Units into the skin 3 (three) times daily with meals.       . insulin glargine (LANTUS) 100 UNIT/ML injection Inject 45 Units into the skin at bedtime.       . metoCLOPramide (REGLAN) 5 MG tablet Take 5 mg by mouth 3 (three) times daily. Frequency unspecified      . mycophenolate (MYFORTIC) 180 MG EC tablet Take 540 mg by mouth 2 (two) times daily.      Marland Kitchen omeprazole (PRILOSEC) 40 MG capsule Take 40 mg by mouth 2 (two) times daily.       Marland Kitchen  predniSONE (DELTASONE) 10 MG tablet Take 10 mg by mouth daily with breakfast.       . tacrolimus (PROGRAF) 0.5 MG capsule Take 3.5 mg by mouth 2 (two) times daily. Take with 1mg  capsules for total of 3.5mg  total dose twice daily      . tiotropium (SPIRIVA) 18 MCG inhalation capsule Place 18 mcg into inhaler and inhale daily.       No current facility-administered medications on file prior to visit.   Allergies  Allergen Reactions  . Barium-Containing Compounds Shortness Of Breath  . Dilaudid [Hydromorphone Hcl] Anaphylaxis  . Hydromorphone Shortness Of Breath  . Ivp Dye [Iodinated Diagnostic Agents] Anaphylaxis  . Shellfish Allergy Shortness Of Breath and Itching    Causes red, patchy areas to form  . Vancomycin Anaphylaxis  . Adhesive [Tape] Other (See Comments)    Dermatitis   . Infed [Iron Dextran] Itching  . Sulfa Antibiotics Hives  . Iopamidol Itching and Rash   History   Social History  . Marital Status: Single    Spouse Name: N/A    Number of Children:  N/A  . Years of Education: N/A   Occupational History  . Not on file.   Social History Main Topics  . Smoking status: Never Smoker   . Smokeless tobacco: Never Used  . Alcohol Use: No  . Drug Use: No  . Sexual Activity: Not Currently   Other Topics Concern  . Not on file   Social History Narrative  . No narrative on file      Review of Systems  All other systems reviewed and are negative.       Objective:   Physical Exam  Vitals reviewed. Neck: Neck supple. No JVD present. No thyromegaly present.  Cardiovascular: Normal rate, regular rhythm and normal heart sounds.  Exam reveals no gallop and no friction rub.   No murmur heard. Pulmonary/Chest: Effort normal and breath sounds normal. No respiratory distress. She has no wheezes. She has no rales.  Abdominal: Soft. Bowel sounds are normal. She exhibits no distension. There is no tenderness. There is no rebound and no guarding.  Musculoskeletal: She exhibits no edema.  Lymphadenopathy:    She has no cervical adenopathy.          Assessment & Plan:  Encounter for chronic pain management  Refill patient's oxycodone 10 mg by mouth 3 times a day. I will obtain a urine drug screen today to confirm compliance and rule out any signs of abuse.  I also gave the patient prevnar 13.

## 2014-01-08 LAB — PRESCRIPTION MONITORING PROFILE (13 PANEL)
Amphetamine/Meth: NEGATIVE ng/mL
Barbiturate Screen, Urine: NEGATIVE ng/mL
Benzodiazepine Screen, Urine: NEGATIVE ng/mL
Buprenorphine, Urine: NEGATIVE ng/mL
Cannabinoid Scrn, Ur: NEGATIVE ng/mL
Cocaine Metabolites: NEGATIVE ng/mL
Creatinine, Urine: 253.29 mg/dL (ref >20.0)
Fentanyl, Ur: NEGATIVE ng/mL
Meperidine, Ur: NEGATIVE ng/mL
Methadone Screen, Urine: NEGATIVE ng/mL
Nitrites, Initial: NEGATIVE ug/mL
Opiate Screen, Urine: NEGATIVE ng/mL
Oxycodone Screen, Ur: NEGATIVE ng/mL
Propoxyphene: NEGATIVE ng/mL
Tramadol Scrn, Ur: NEGATIVE ng/mL
pH, Initial: 5.5 pH (ref 4.5–8.9)

## 2014-01-08 NOTE — Addendum Note (Signed)
Addended by: Shary Decamp B on: 01/08/2014 08:47 AM   Modules accepted: Orders

## 2014-01-22 ENCOUNTER — Telehealth: Payer: Self-pay | Admitting: Family Medicine

## 2014-01-22 NOTE — Telephone Encounter (Signed)
Has had a rash for several days and cyst under her arm.  Appt given for her to see provider next week at time that was convenient for her.

## 2014-01-26 ENCOUNTER — Ambulatory Visit (INDEPENDENT_AMBULATORY_CARE_PROVIDER_SITE_OTHER): Payer: Medicare HMO | Admitting: Family Medicine

## 2014-01-26 ENCOUNTER — Encounter: Payer: Self-pay | Admitting: Family Medicine

## 2014-01-26 VITALS — BP 118/74 | HR 80 | Temp 97.4°F | Resp 18 | Ht 63.0 in | Wt 257.0 lb

## 2014-01-26 DIAGNOSIS — G43901 Migraine, unspecified, not intractable, with status migrainosus: Secondary | ICD-10-CM

## 2014-01-26 DIAGNOSIS — L732 Hidradenitis suppurativa: Secondary | ICD-10-CM

## 2014-01-26 MED ORDER — SUMATRIPTAN SUCCINATE 50 MG PO TABS: ORAL_TABLET | ORAL | Status: DC

## 2014-01-26 NOTE — Progress Notes (Signed)
Subjective:    Patient ID: [name], female    DOB: 1976-12-08, 38 y.o.   MRN: [id number]  HPI  Patient has a family history of migraines. She reports 2 weeks of episodic right sided headache is located behind and above her right eye. It is pounding in nature. It is associated with photophobia. It is also in her right temple. It is also associated with phonophobia. It causes nausea.  She also has had a swollen area in her left axilla. Last week it was red and inflamed. This week it seems to be improving. It is a 1.5 cm subcutaneous lump in the left axilla consistent with hidradenitis upper teeth are. There is no evidence of infection or abscess at the present time. There is no erythema or warmth. He seems to be gradually resolving on its own. Past Medical History  Diagnosis Date  . Chronic anemia   . Panic disorder   . Crohn's disease   . Anxiety     OCD  . 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 Fracture 05/01/2013    Right foot, in cast  . Hypothyroidism     parathroid runs low  . History of blood transfusion 2 years ago  . Cancer 2001    renal cell , breast cancer both breast 2011, cervical cancer 2013  . Complication of anesthesia     hard to sedate dr hung aware  . PONV (postoperative nausea and vomiting)   . Renal disorder     S/p '11-Kidney transplant-Dr. Florene Glen follows   Current Outpatient Prescriptions on File Prior to Visit  Medication Sig Dispense Refill  . atorvastatin (LIPITOR) 20 MG tablet Take 20 mg by mouth at bedtime.      . busPIRone (BUSPAR) 15 MG tablet Take 15 mg by mouth 2 (two) times daily.       . calcitRIOL (ROCALTROL) 0.25 MCG capsule Take 1.75 mcg by mouth every morning.      . calcium carbonate (OS-CAL - DOSED IN MG OF ELEMENTAL CALCIUM) 1250 MG tablet Take 1 tablet by mouth 3 (three) times daily.      . cetirizine (ZYRTEC) 10 MG tablet Take 10 mg by mouth every evening.      . cilostazol (PLETAL) 50 MG tablet Take 1 tablet (50 mg total) by  mouth 2 (two) times daily.  60 tablet  0  . cyclobenzaprine (FLEXERIL) 10 MG tablet Take 1 tablet (10 mg total) by mouth 3 (three) times daily as needed for muscle spasms.  60 tablet  0  . FLUoxetine (PROZAC) 20 MG capsule Take 60 mg by mouth every morning.      . hydrOXYzine (ATARAX/VISTARIL) 25 MG tablet Take 50 mg by mouth at bedtime.       . insulin aspart (NOVOLOG FLEXPEN) 100 UNIT/ML SOPN FlexPen Inject 15 Units into the skin 3 (three) times daily with meals.       . insulin glargine (LANTUS) 100 UNIT/ML injection Inject 45 Units into the skin at bedtime.       . metoCLOPramide (REGLAN) 5 MG tablet Take 5 mg by mouth 3 (three) times daily. Frequency unspecified      . mycophenolate (MYFORTIC) 180 MG EC tablet Take 540 mg by mouth 2 (two) times daily.      Marland Kitchen omeprazole (PRILOSEC) 40 MG capsule Take 40 mg by mouth 2 (two) times daily.       . Oxycodone HCl 10 MG TABS Take 1 tablet (10 mg  total) by mouth 3 (three) times daily as needed (pain). For pain  90 tablet  0  . predniSONE (DELTASONE) 10 MG tablet Take 10 mg by mouth daily with breakfast.       . tacrolimus (PROGRAF) 0.5 MG capsule Take 3.5 mg by mouth 2 (two) times daily. Take with 1mg  capsules for total of 3.5mg  total dose twice daily      . tiotropium (SPIRIVA) 18 MCG inhalation capsule Place 18 mcg into inhaler and inhale daily.       No current facility-administered medications on file prior to visit.   Allergies  Allergen Reactions  . Barium-Containing Compounds Shortness Of Breath  . Dilaudid [Hydromorphone Hcl] Anaphylaxis  . Hydromorphone Shortness Of Breath  . Ivp Dye [Iodinated Diagnostic Agents] Anaphylaxis  . Shellfish Allergy Shortness Of Breath and Itching    Causes red, patchy areas to form  . Vancomycin Anaphylaxis  . Adhesive [Tape] Other (See Comments)    Dermatitis   . Infed [Iron Dextran] Itching  . Sulfa Antibiotics Hives  . Iopamidol Itching and Rash   History   Social History  . Marital Status:  Single    Spouse Name: N/A    Number of Children: N/A  . Years of Education: N/A   Occupational History  . Not on file.   Social History Main Topics  . Smoking status: Never Smoker   . Smokeless tobacco: Never Used  . Alcohol Use: No  . Drug Use: No  . Sexual Activity: Not Currently   Other Topics Concern  . Not on file   Social History Narrative  . No narrative on file     Review of Systems  All other systems reviewed and are negative.       Objective:   Physical Exam  Vitals reviewed. Constitutional: She is oriented to person, place, and time.  Cardiovascular: Normal rate and regular rhythm.   Pulmonary/Chest: Effort normal and breath sounds normal.  Neurological: She is alert and oriented to person, place, and time. She has normal reflexes. She displays normal reflexes. No cranial nerve deficit. She exhibits normal muscle tone. Coordination normal.   1.5 cm subcutaneous nodule in the left axilla. There is no warmth. There is no erythema. He is not tender to the touch.        Assessment & Plan:  1. Migraine with status migrainosus Imitrex 50 mg by mouth x1. Patient may repeat in 2 hours if headache persists.  2. Hidradenitis suppurativa of left axilla I recommended warm compresses 3 times a day. This seems to be resolving on its own. In the future I recommended that she decrease the frequency in which she shaves her axilla. She can use products such as Nare.  I recommended that she take a bath once a week a capful of Clorox added to try to keep the bacterial counts suppressed on her skin. In the future if the area becomes erythematous, tender, or swollen she'll need incision and drainage.

## 2014-01-29 ENCOUNTER — Ambulatory Visit: Payer: Medicare HMO | Admitting: Cardiovascular Disease

## 2014-02-02 ENCOUNTER — Telehealth: Payer: Self-pay | Admitting: Family Medicine

## 2014-02-02 NOTE — Telephone Encounter (Signed)
Call back number is [phone] Pt is needing a refill on her Oxycodone HCl 10 MG TABS The pt needs to also talk about her Headaches

## 2014-02-03 NOTE — Telephone Encounter (Signed)
?   OK to Refill  

## 2014-02-03 NOTE — Telephone Encounter (Signed)
ok 

## 2014-02-05 MED ORDER — OXYCODONE HCL 10 MG PO TABS: 10.0000 mg | ORAL_TABLET | Freq: Three times a day (TID) | ORAL | Status: DC | PRN

## 2014-02-05 NOTE — Telephone Encounter (Signed)
RX printed, left up front and patient aware to pick up  

## 2014-02-08 ENCOUNTER — Telehealth: Payer: Self-pay | Admitting: Family Medicine

## 2014-02-08 MED ORDER — AMOXICILLIN 875 MG PO TABS: 875.0000 mg | ORAL_TABLET | Freq: Two times a day (BID) | ORAL | Status: DC

## 2014-02-08 NOTE — Telephone Encounter (Signed)
Amoxicillin 875 mg pobid for 10 day

## 2014-02-08 NOTE — Telephone Encounter (Signed)
Med sent to pharm and pt aware 

## 2014-02-08 NOTE — Telephone Encounter (Signed)
Sore throat and ear ache pain over teeth and pt does not know what to do, what can she do Call back number is [phone]

## 2014-02-24 ENCOUNTER — Other Ambulatory Visit: Payer: Self-pay

## 2014-02-24 MED ORDER — CILOSTAZOL 50 MG PO TABS: 50.0000 mg | ORAL_TABLET | Freq: Two times a day (BID) | ORAL | Status: DC

## 2014-02-24 NOTE — Telephone Encounter (Signed)
Rx was sent to pharmacy electronically. 

## 2014-02-26 ENCOUNTER — Ambulatory Visit: Payer: Medicare HMO | Admitting: Family Medicine

## 2014-03-02 ENCOUNTER — Encounter: Payer: Self-pay | Admitting: Family Medicine

## 2014-03-02 ENCOUNTER — Ambulatory Visit (INDEPENDENT_AMBULATORY_CARE_PROVIDER_SITE_OTHER): Payer: BC Managed Care – PPO | Admitting: Family Medicine

## 2014-03-02 ENCOUNTER — Telehealth: Payer: Self-pay | Admitting: *Deleted

## 2014-03-02 VITALS — BP 132/74 | HR 62 | Temp 98.3°F | Resp 14 | Ht 63.0 in | Wt 253.0 lb

## 2014-03-02 DIAGNOSIS — R509 Fever, unspecified: Secondary | ICD-10-CM

## 2014-03-02 DIAGNOSIS — J019 Acute sinusitis, unspecified: Secondary | ICD-10-CM

## 2014-03-02 LAB — CBC WITH DIFFERENTIAL/PLATELET
Basophils Absolute: 0 10*3/uL (ref 0.0–0.1)
Basophils Relative: 0 % (ref 0–1)
Eosinophils Absolute: 0.2 10*3/uL (ref 0.0–0.7)
Eosinophils Relative: 2 % (ref 0–5)
HCT: 38.2 % (ref 36.0–46.0)
Hemoglobin: 12.7 g/dL (ref 12.0–15.0)
Lymphocytes Relative: 26 % (ref 12–46)
Lymphs Abs: 2.1 10*3/uL (ref 0.7–4.0)
MCH: 26.7 pg (ref 26.0–34.0)
MCHC: 33.2 g/dL (ref 30.0–36.0)
MCV: 80.4 fL (ref 78.0–100.0)
Monocytes Absolute: 0.5 10*3/uL (ref 0.1–1.0)
Monocytes Relative: 6 % (ref 3–12)
Neutro Abs: 5.3 10*3/uL (ref 1.7–7.7)
Neutrophils Relative %: 66 % (ref 43–77)
Platelets: 355 10*3/uL (ref 150–400)
RBC: 4.75 MIL/uL (ref 3.87–5.11)
RDW: 14.1 % (ref 11.5–15.5)
WBC: 8 10*3/uL (ref 4.0–10.5)

## 2014-03-02 LAB — COMPREHENSIVE METABOLIC PANEL
ALT: 9 U/L (ref 0–35)
AST: 10 U/L (ref 0–37)
Albumin: 4.2 g/dL (ref 3.5–5.2)
Alkaline Phosphatase: 106 U/L (ref 39–117)
BUN: 20 mg/dL (ref 6–23)
CO2: 19 mEq/L (ref 19–32)
Calcium: 7.7 mg/dL — ABNORMAL LOW (ref 8.4–10.5)
Chloride: 102 mEq/L (ref 96–112)
Creat: 1.67 mg/dL — ABNORMAL HIGH (ref 0.50–1.10)
Glucose, Bld: 216 mg/dL — ABNORMAL HIGH (ref 70–99)
Potassium: 3.9 mEq/L (ref 3.5–5.3)
Sodium: 134 mEq/L — ABNORMAL LOW (ref 135–145)
Total Bilirubin: 0.6 mg/dL (ref 0.2–1.2)
Total Protein: 7.1 g/dL (ref 6.0–8.3)

## 2014-03-02 MED ORDER — ANTIPYRINE-BENZOCAINE 5.4-1.4 % OT SOLN: 3.0000 [drp] | OTIC | Status: DC | PRN

## 2014-03-02 MED ORDER — OXYCODONE HCL 10 MG PO TABS: 10.0000 mg | ORAL_TABLET | Freq: Three times a day (TID) | ORAL | Status: DC | PRN

## 2014-03-02 MED ORDER — DOXYCYCLINE HYCLATE 100 MG PO TABS: 100.0000 mg | ORAL_TABLET | Freq: Two times a day (BID) | ORAL | Status: DC

## 2014-03-02 NOTE — Progress Notes (Signed)
Patient ID: [name], female   DOB: 08-06-1976, 38 y.o.   MRN: [id number]     Subjective:    Patient ID: [name], female    DOB: December 15, 1976, 38 y.o.   MRN: [id number]  Patient presents for Illness  patient here with sinus pressure and drainage sore throat ear pain for the past 3 weeks. She was started on amoxicillin about 2-1/2 weeks ago she completed a ten-day course but now has worsening sinus pressure and drainage with green discharge. She's also been running a low-grade fever at 90. She is immunocompromised status post kidney transplant he was in the hospital couple months ago secondary to urinary tract infection. She has very minimal cough mostly from postnasal drip. She's not had any UTI symptoms or chest discomfort. She does work as a Air cabin crew:  GEN- denies fatigue,+ fever, weight loss,weakness, recent illness HEENT- denies eye drainage, change in vision,+ nasal discharge, CVS- denies chest pain, palpitations RESP- denies SOB, +cough, wheeze ABD- denies N/V, change in stools, abd pain GU- denies dysuria, hematuria, dribbling, incontinence MSK- denies joint pain, muscle aches, injury Neuro- denies headache, dizziness, syncope, seizure activity       Objective:    BP 132/74  Pulse 62  Temp(Src) 98.3 F (36.8 C)  Resp 14  Ht 5\' 3"  (1.6 m)  Wt 253 lb (114.76 kg)  BMI 44.83 kg/m2  LMP 01/30/2012 GEN- NAD, alert and oriented x3 HEENT- PERRL, EOMI, non injected sclera, pink conjunctiva, MMM, oropharynx mild injection, TM clear bilat no effusion,  + maxillary sinus tenderness, inflammed turbinates,  Nasal drainage  Neck- Supple, shotty anterior cervical LAD CVS- RRR, 2/6 SEM  RESP-CTAB EXT- No edema Pulses- Radial 2+          Assessment & Plan:      Problem List Items Addressed This Visit   None    Visit Diagnoses   Acute sinusitis    -  Primary    No red flags, check CBC w diff, change to doxycycline, add flonase,  continue mucinex    Relevant Medications       DOXYCYCLINE HYCLATE 100 MG PO TABS    Fever, unspecified        Relevant Orders       CBC with Differential (Completed)       Comprehensive metabolic panel (Completed)       Note: This dictation was prepared with Dragon dictation along with smaller phrase technology. Any transcriptional errors that result from this process are unintentional.

## 2014-03-02 NOTE — Telephone Encounter (Signed)
RX printed, left up front and given to pt

## 2014-03-02 NOTE — Patient Instructions (Signed)
Take a probiotic due to the amount of antibiotics you have been on, or eat activia daily for next 2-3 weeks Start doxycycline Continue mucinex sinus AB otic given for ear pain F/U as needed

## 2014-03-02 NOTE — Telephone Encounter (Signed)
Ok's by [facility] and rx given to pt

## 2014-03-02 NOTE — Telephone Encounter (Signed)
Ok to refill Oxycodone??  Last office visit 01/26/2014.  Last refill 02/05/2014.

## 2014-03-02 NOTE — Telephone Encounter (Signed)
Ok to refill Oxycodone??   Last office visit 01/26/2014.   Last refill 02/05/2014.

## 2014-03-16 ENCOUNTER — Ambulatory Visit (INDEPENDENT_AMBULATORY_CARE_PROVIDER_SITE_OTHER): Payer: BC Managed Care – PPO | Admitting: Family Medicine

## 2014-03-16 ENCOUNTER — Encounter: Payer: Self-pay | Admitting: Family Medicine

## 2014-03-16 VITALS — BP 104/60 | HR 74 | Temp 98.5°F | Resp 18 | Ht 63.0 in | Wt 250.0 lb

## 2014-03-16 DIAGNOSIS — J329 Chronic sinusitis, unspecified: Secondary | ICD-10-CM

## 2014-03-16 DIAGNOSIS — J310 Chronic rhinitis: Secondary | ICD-10-CM

## 2014-03-16 MED ORDER — FLUCONAZOLE 150 MG PO TABS: 150.0000 mg | ORAL_TABLET | Freq: Once | ORAL | Status: DC

## 2014-03-16 MED ORDER — CEFDINIR 300 MG PO CAPS: 300.0000 mg | ORAL_CAPSULE | Freq: Two times a day (BID) | ORAL | Status: DC

## 2014-03-16 MED ORDER — PREDNISONE 20 MG PO TABS: ORAL_TABLET | ORAL | Status: DC

## 2014-03-16 NOTE — Progress Notes (Signed)
Subjective:    Patient ID: [name], female    DOB: 1976-08-04, 38 y.o.   MRN: [id number]  HPI Patient has been having a sinus infection for the last 6-8 weeks. She's been treated with amoxicillin and then later with doxycycline without any improvement. She continues to have pain and pressure in her left ear. She is having pain and pressure in her left and right maxillary sinus.  She is having daily headaches and sinus pressure. It hurts severely when she leans forward. She is having facial pain and pain in her teeth. She denies any fevers or chills. She is currently taking Nasonex without any relief. Past Medical History  Diagnosis Date  . Chronic anemia   . Panic disorder   . Crohn's disease   . Anxiety     OCD  . 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 Fracture 05/01/2013    Right foot, in cast  . Hypothyroidism     parathroid runs low  . History of blood transfusion 2 years ago  . Cancer 2001    renal cell , breast cancer both breast 2011, cervical cancer 2013  . Complication of anesthesia     hard to sedate dr hung aware  . PONV (postoperative nausea and vomiting)   . Renal disorder     S/p '11-Kidney transplant-Dr. Florene Glen follows  . Hyperlipidemia    Current Outpatient Prescriptions on File Prior to Visit  Medication Sig Dispense Refill  . antipyrine-benzocaine (AURALGAN) otic solution Place 3-4 drops into the right ear every 2 (two) hours as needed for ear pain.  10 mL  0  . atorvastatin (LIPITOR) 20 MG tablet Take 20 mg by mouth at bedtime.      . busPIRone (BUSPAR) 15 MG tablet Take 15 mg by mouth 2 (two) times daily.       . calcitRIOL (ROCALTROL) 0.25 MCG capsule Take 1.75 mcg by mouth every morning.      . calcium carbonate (OS-CAL - DOSED IN MG OF ELEMENTAL CALCIUM) 1250 MG tablet Take 1 tablet by mouth 3 (three) times daily.      . cetirizine (ZYRTEC) 10 MG tablet Take 10 mg by mouth every evening.      . cilostazol (PLETAL) 50 MG tablet Take 1 tablet (50 mg  total) by mouth 2 (two) times daily.  30 tablet  0  . cyclobenzaprine (FLEXERIL) 10 MG tablet Take 1 tablet (10 mg total) by mouth 3 (three) times daily as needed for muscle spasms.  60 tablet  0  . FLUoxetine (PROZAC) 20 MG capsule Take 60 mg by mouth every morning.      . hydrOXYzine (ATARAX/VISTARIL) 25 MG tablet Take 50 mg by mouth at bedtime.       . insulin aspart (NOVOLOG FLEXPEN) 100 UNIT/ML SOPN FlexPen Inject 15 Units into the skin 3 (three) times daily with meals.       . insulin glargine (LANTUS) 100 UNIT/ML injection Inject 45 Units into the skin at bedtime.       . metoCLOPramide (REGLAN) 5 MG tablet Take 5 mg by mouth 3 (three) times daily. Frequency unspecified      . mycophenolate (MYFORTIC) 180 MG EC tablet Take 540 mg by mouth 2 (two) times daily.      Marland Kitchen omeprazole (PRILOSEC) 40 MG capsule Take 40 mg by mouth 2 (two) times daily.       . Oxycodone HCl 10 MG TABS Take 1 tablet (10 mg total)  by mouth 3 (three) times daily as needed (pain). For pain  90 tablet  0  . predniSONE (DELTASONE) 10 MG tablet Take 10 mg by mouth daily with breakfast.       . SUMAtriptan (IMITREX) 50 MG tablet May repeat in 2 hours if headache persists or recurs x 1 dose  10 tablet  0  . tacrolimus (PROGRAF) 0.5 MG capsule Take 3.5 mg by mouth 2 (two) times daily. Take with 1mg  capsules for total of 3.5mg  total dose twice daily      . tiotropium (SPIRIVA) 18 MCG inhalation capsule Place 18 mcg into inhaler and inhale daily.       No current facility-administered medications on file prior to visit.   Allergies  Allergen Reactions  . Barium-Containing Compounds Shortness Of Breath  . Dilaudid [Hydromorphone Hcl] Anaphylaxis  . Hydromorphone Shortness Of Breath  . Ivp Dye [Iodinated Diagnostic Agents] Anaphylaxis  . Shellfish Allergy Shortness Of Breath and Itching    Causes red, patchy areas to form  . Vancomycin Anaphylaxis  . Adhesive [Tape] Other (See Comments)    Dermatitis   . Infed [Iron  Dextran] Itching  . Sulfa Antibiotics Hives  . Iopamidol Itching and Rash   History   Social History  . Marital Status: Single    Spouse Name: N/A    Number of Children: N/A  . Years of Education: N/A   Occupational History  . Not on file.   Social History Main Topics  . Smoking status: Never Smoker   . Smokeless tobacco: Never Used  . Alcohol Use: No  . Drug Use: No  . Sexual Activity: Not Currently   Other Topics Concern  . Not on file   Social History Narrative  . No narrative on file      Review of Systems  All other systems reviewed and are negative.       Objective:   Physical Exam  Vitals reviewed. HENT:  Head: Normocephalic and atraumatic.  Right Ear: Tympanic membrane, external ear and ear canal normal. Tympanic membrane is not injected, not scarred, not perforated, not erythematous, not retracted and not bulging.  Left Ear: Tympanic membrane, external ear and ear canal normal. Tympanic membrane is not injected, not scarred, not perforated, not erythematous, not retracted and not bulging.  Nose: Mucosal edema and rhinorrhea present. Right sinus exhibits maxillary sinus tenderness and frontal sinus tenderness. Left sinus exhibits maxillary sinus tenderness.  Mouth/Throat: Oropharynx is clear and moist. No oropharyngeal exudate.  Eyes: Conjunctivae and EOM are normal. Pupils are equal, round, and reactive to light.  Neck: Neck supple. No thyromegaly present.  Cardiovascular: Normal rate, regular rhythm and normal heart sounds.   Pulmonary/Chest: Effort normal and breath sounds normal. No respiratory distress. She has no wheezes. She has no rales.  Lymphadenopathy:    She has no cervical adenopathy.          Assessment & Plan:  1. Chronic rhinosinusitis Recheck in 2 weeks. If no better at that time I will recommend a CT scan of the sinuses or ENT consult. - cefdinir (OMNICEF) 300 MG capsule; Take 1 capsule (300 mg total) by mouth 2 (two) times daily.   Dispense: 20 capsule; Refill: 0 - predniSONE (DELTASONE) 20 MG tablet; 3 tabs poqday 1-2, 2 tabs poqday 3-4, 1 tab poqday 5-6  Dispense: 12 tablet; Refill: 0

## 2014-03-21 ENCOUNTER — Emergency Department (HOSPITAL_COMMUNITY)
Admission: EM | Admit: 2014-03-21 | Discharge: 2014-03-21 | Disposition: A | Discharge status: Home / Self Care | Payer: BC Managed Care – PPO | Attending: Emergency Medicine | Admitting: Emergency Medicine

## 2014-03-21 ENCOUNTER — Encounter (HOSPITAL_COMMUNITY): Payer: Self-pay | Admitting: Emergency Medicine

## 2014-03-21 DIAGNOSIS — Z940 Kidney transplant status: Secondary | ICD-10-CM | POA: Insufficient documentation

## 2014-03-21 DIAGNOSIS — Z853 Personal history of malignant neoplasm of breast: Secondary | ICD-10-CM | POA: Insufficient documentation

## 2014-03-21 DIAGNOSIS — K5090 Crohn's disease, unspecified, without complications: Secondary | ICD-10-CM | POA: Insufficient documentation

## 2014-03-21 DIAGNOSIS — Z90710 Acquired absence of both cervix and uterus: Secondary | ICD-10-CM | POA: Insufficient documentation

## 2014-03-21 DIAGNOSIS — H9209 Otalgia, unspecified ear: Secondary | ICD-10-CM | POA: Insufficient documentation

## 2014-03-21 DIAGNOSIS — E785 Hyperlipidemia, unspecified: Secondary | ICD-10-CM | POA: Insufficient documentation

## 2014-03-21 DIAGNOSIS — Z8781 Personal history of (healed) traumatic fracture: Secondary | ICD-10-CM | POA: Insufficient documentation

## 2014-03-21 DIAGNOSIS — Z794 Long term (current) use of insulin: Secondary | ICD-10-CM | POA: Insufficient documentation

## 2014-03-21 DIAGNOSIS — Z85528 Personal history of other malignant neoplasm of kidney: Secondary | ICD-10-CM | POA: Insufficient documentation

## 2014-03-21 DIAGNOSIS — Z792 Long term (current) use of antibiotics: Secondary | ICD-10-CM | POA: Insufficient documentation

## 2014-03-21 DIAGNOSIS — H9202 Otalgia, left ear: Secondary | ICD-10-CM

## 2014-03-21 DIAGNOSIS — Z9851 Tubal ligation status: Secondary | ICD-10-CM | POA: Insufficient documentation

## 2014-03-21 DIAGNOSIS — IMO0002 Reserved for concepts with insufficient information to code with codable children: Secondary | ICD-10-CM | POA: Insufficient documentation

## 2014-03-21 DIAGNOSIS — Z79899 Other long term (current) drug therapy: Secondary | ICD-10-CM | POA: Insufficient documentation

## 2014-03-21 DIAGNOSIS — F410 Panic disorder [episodic paroxysmal anxiety] without agoraphobia: Secondary | ICD-10-CM | POA: Insufficient documentation

## 2014-03-21 DIAGNOSIS — Z862 Personal history of diseases of the blood and blood-forming organs and certain disorders involving the immune mechanism: Secondary | ICD-10-CM | POA: Insufficient documentation

## 2014-03-21 DIAGNOSIS — K219 Gastro-esophageal reflux disease without esophagitis: Secondary | ICD-10-CM | POA: Insufficient documentation

## 2014-03-21 DIAGNOSIS — E119 Type 2 diabetes mellitus without complications: Secondary | ICD-10-CM | POA: Insufficient documentation

## 2014-03-21 DIAGNOSIS — Z8541 Personal history of malignant neoplasm of cervix uteri: Secondary | ICD-10-CM | POA: Insufficient documentation

## 2014-03-21 MED ORDER — HYDROCODONE-ACETAMINOPHEN 5-325 MG PO TABS: 1.0000 | ORAL_TABLET | ORAL | Status: DC | PRN

## 2014-03-21 MED ORDER — HYDROCODONE-ACETAMINOPHEN 5-325 MG PO TABS: 2.0000 | ORAL_TABLET | ORAL | Status: DC | PRN

## 2014-03-21 MED ORDER — GI COCKTAIL ~~LOC~~
30.0000 mL | Freq: Once | ORAL | Status: AC
  Administered 2014-03-21: 30 mL via ORAL
  Filled 2014-03-21: qty 30

## 2014-03-21 NOTE — ED Notes (Signed)
Pt reporting recently being treated for sinus infection.  Reports continued pain and pressure in left ear.

## 2014-03-21 NOTE — ED Provider Notes (Signed)
CSN: [id number]     Arrival date & time 03/21/14  0223 History   First MD Initiated Contact with Patient 03/21/14 0255         The history is provided by the patient.   Patient reports left ear pain has been worsening over the past several days.  She has been on antibiotics in the past several weeks for a possible sinus infection.  She states she's had no fevers or chills.  She continues to describe some nasal congestion.  She states the majority of the nasal pressure is better.  She did have a right otitis media which she says is much better.  She now reports increasing left ear pain.  She also has some discomfort in that side of her anterior neck.  No masses abdomen palpated by the patient.  She reports mild sore throat.  No fevers or chills.  No difficulty breathing or swallowing.  No drainage from her left ear.  No recent injury or trauma.  Patient is the recipient of a transplanted kidney.  She's been compliant with her medications.  Patient also reports transient "heartburn" this evening that's been constant since 7 that worsens when she lies flat.  She has somewhat of a bad taste in her mouth.  She is on Prilosec twice a day.    Past Medical History  Diagnosis Date  . Chronic anemia   . Panic disorder   . Crohn's disease   . Anxiety     OCD  . 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 Fracture 05/01/2013    Right foot, in cast  . Hypothyroidism     parathroid runs low  . History of blood transfusion 2 years ago  . Cancer 2001    renal cell , breast cancer both breast 2011, cervical cancer 2013  . Complication of anesthesia     hard to sedate dr hung aware  . PONV (postoperative nausea and vomiting)   . Renal disorder     S/p '11-Kidney transplant-Dr. Florene Glen follows  . Hyperlipidemia    Past Surgical History  Procedure Laterality Date  . Nephrectomy transplanted organ  1999 and 2011    last '11-[facility]  . Parathyroidectomy  02-20-2001  . Knee surgery Left april 2003   arthroscopy  . Cystoscopy  02/13/2012    Procedure: CYSTOSCOPY;  Surgeon: Franchot Gallo, MD;  Location: [facility];  Service: Urology;  Laterality: N/A;  insertion of ureteral catheter , removed per Dr Diona Fanti   . Tubal ligation  02-19-2000  . Abdominal hysterectomy  02/13/2012    Procedure: HYSTERECTOMY ABDOMINAL;  Surgeon: Cyril Mourning, MD;  Location: [facility];  Service: Gynecology;  Laterality: N/A;  . Breast lumpectomy and reconstruction Bilateral 2011  . Esophageal manometry N/A 08/10/2013    Procedure: ESOPHAGEAL MANOMETRY (EM);  Surgeon: Beryle Beams, MD;  Location: [facility];  Service: Endoscopy;  Laterality: N/A;  . Incision and drainage abscess N/A 08/26/2013    Procedure: INCISION AND DRAINAGE PERIRECTAL ABSCESS;  Surgeon: Jamesetta So, MD;  Location: [facility];  Service: General;  Laterality: N/A;  . Esophagogastroduodenoscopy N/A 09/25/2013    Procedure: ESOPHAGOGASTRODUODENOSCOPY (EGD);  Surgeon: Beryle Beams, MD;  Location: [facility];  Service: Endoscopy;  Laterality: N/A;  . Colonoscopy N/A 09/25/2013    Procedure: COLONOSCOPY;  Surgeon: Beryle Beams, MD;  Location: [facility];  Service: Endoscopy;  Laterality: N/A;  . Groin dissection Left 10/09/2013    Procedure: INCISION AND DRAINAGE OF LEFT  GROIN ABSCESS;  Surgeon: Shann Medal, MD;  Location: [facility];  Service: General;  Laterality: Left;  . Breast surgery      bilateral lumpectomy 4'11  . Esophagogastroduodenoscopy N/A 11/27/2013    Procedure: ESOPHAGOGASTRODUODENOSCOPY (EGD);  Surgeon: Beryle Beams, MD;  Location: [facility];  Service: Endoscopy;  Laterality: N/A;  . Bravo ph study N/A 11/27/2013    Procedure: BRAVO [place] STUDY;  Surgeon: Beryle Beams, MD;  Location: [facility];  Service: Endoscopy;  Laterality: N/A;  . Esophagogastroduodenoscopy N/A 12/11/2013    Procedure: ESOPHAGOGASTRODUODENOSCOPY (EGD);  Surgeon: Beryle Beams, MD;  Location: [facility];  Service: Endoscopy;  Laterality: N/A;  .  Bravo ph study N/A 12/11/2013    Procedure: BRAVO [place] STUDY;  Surgeon: Beryle Beams, MD;  Location: [facility];  Service: Endoscopy;  Laterality: N/A;  . Lower extremity arterial dopllers  05/06/2012    Bilat ABIs were attempted-not accessible due to calcified veins. Bilat TBIs demonstrated no obtainable flow through both great toes. Bilat PVR ankle regions demonstrated moderately abnormal pulsatile flow. Lft SFA demonstrated a 50-69% diameter reduction. Lft runoff demonstrated occlusive diseasewith reconstitution of flow noted distally.   Family History  Problem Relation Age of Onset  . Diabetes Mother   . Hypertension Mother   . Diabetes Father   . Hypertension Father   . Cancer Father     Prostate cancer  . Obesity Sister   . Stroke Maternal Grandmother   . Heart attack Maternal Grandfather   . Polycystic ovary syndrome Sister    History  Substance Use Topics  . Smoking status: Never Smoker   . Smokeless tobacco: Never Used  . Alcohol Use: No   OB History   Grav Para Term Preterm Abortions TAB SAB Ect Mult Living                 Review of Systems  All other systems reviewed and are negative.      Allergies  Barium-containing compounds; Dilaudid; Hydromorphone; Ivp dye; Shellfish allergy; Vancomycin; Adhesive; Infed; Sulfa antibiotics; and Iopamidol  Home Medications   Current Outpatient Rx  Name  Route  Sig  Dispense  Refill  . antipyrine-benzocaine (AURALGAN) otic solution   Right Ear   Place 3-4 drops into the right ear every 2 (two) hours as needed for ear pain.   10 mL   0   . atorvastatin (LIPITOR) 20 MG tablet   Oral   Take 20 mg by mouth at bedtime.         . busPIRone (BUSPAR) 15 MG tablet   Oral   Take 15 mg by mouth 2 (two) times daily.          . calcitRIOL (ROCALTROL) 0.25 MCG capsule   Oral   Take 1.75 mcg by mouth every morning.         . calcium carbonate (OS-CAL - DOSED IN MG OF ELEMENTAL CALCIUM) 1250 MG tablet   Oral   Take 1  tablet by mouth 3 (three) times daily.         . cefdinir (OMNICEF) 300 MG capsule   Oral   Take 1 capsule (300 mg total) by mouth 2 (two) times daily.   20 capsule   0   . cetirizine (ZYRTEC) 10 MG tablet   Oral   Take 10 mg by mouth every evening.         . cilostazol (PLETAL) 50 MG tablet   Oral   Take 1  tablet (50 mg total) by mouth 2 (two) times daily.   30 tablet   0     PATIENT NEEDS AN APPOINTMENT FOR FUTURE REFILLS. P ...   . cyclobenzaprine (FLEXERIL) 10 MG tablet   Oral   Take 1 tablet (10 mg total) by mouth 3 (three) times daily as needed for muscle spasms.   60 tablet   0   . fluconazole (DIFLUCAN) 150 MG tablet   Oral   Take 1 tablet (150 mg total) by mouth once.   1 tablet   0   . FLUoxetine (PROZAC) 20 MG capsule   Oral   Take 60 mg by mouth every morning.         Marland Kitchen HYDROcodone-acetaminophen (NORCO/VICODIN) 5-325 MG per tablet   Oral   Take 2 tablets by mouth every 4 (four) hours as needed.   6 tablet   0   . HYDROcodone-acetaminophen (NORCO/VICODIN) 5-325 MG per tablet   Oral   Take 1 tablet by mouth every 4 (four) hours as needed for moderate pain.   12 tablet   0   . hydrOXYzine (ATARAX/VISTARIL) 25 MG tablet   Oral   Take 50 mg by mouth at bedtime.          . insulin aspart (NOVOLOG FLEXPEN) 100 UNIT/ML SOPN FlexPen   Subcutaneous   Inject 15 Units into the skin 3 (three) times daily with meals.          . insulin glargine (LANTUS) 100 UNIT/ML injection   Subcutaneous   Inject 45 Units into the skin at bedtime.          . metoCLOPramide (REGLAN) 5 MG tablet   Oral   Take 5 mg by mouth 3 (three) times daily. Frequency unspecified         . mycophenolate (MYFORTIC) 180 MG EC tablet   Oral   Take 540 mg by mouth 2 (two) times daily.         Marland Kitchen omeprazole (PRILOSEC) 40 MG capsule   Oral   Take 40 mg by mouth 2 (two) times daily.          . Oxycodone HCl 10 MG TABS   Oral   Take 1 tablet (10 mg total) by mouth 3  (three) times daily as needed (pain). For pain   90 tablet   0     Do not fill 03/05/14   . predniSONE (DELTASONE) 10 MG tablet   Oral   Take 10 mg by mouth daily with breakfast.          . predniSONE (DELTASONE) 20 MG tablet      3 tabs poqday 1-2, 2 tabs poqday 3-4, 1 tab poqday 5-6   12 tablet   0   . SUMAtriptan (IMITREX) 50 MG tablet      May repeat in 2 hours if headache persists or recurs x 1 dose   10 tablet   0   . tacrolimus (PROGRAF) 0.5 MG capsule   Oral   Take 3.5 mg by mouth 2 (two) times daily. Take with 1mg  capsules for total of 3.5mg  total dose twice daily         . tiotropium (SPIRIVA) 18 MCG inhalation capsule   Inhalation   Place 18 mcg into inhaler and inhale daily.          BP 120/72  Pulse 83  Temp(Src) 98.7 F (37.1 C) (Oral)  Resp 18  Ht 5\' 3"  (1.6 m)  Wt 250 lb (113.399 kg)  BMI 44.30 kg/m2  SpO2 98%  LMP 01/30/2012 Physical Exam  Nursing note and vitals reviewed. Constitutional: She is oriented to person, place, and time. She appears well-developed and well-nourished. No distress.  HENT:  Head: Normocephalic and atraumatic.  Right Ear: Tympanic membrane, external ear and ear canal normal.  Left Ear: Tympanic membrane, external ear and ear canal normal. No swelling or tenderness. No foreign bodies. Tympanic membrane is not injected.  No middle ear effusion. No hemotympanum.  Nose: Nose normal.  Mouth/Throat: Uvula is midline, oropharynx is clear and moist and mucous membranes are normal. No oropharyngeal exudate or posterior oropharyngeal edema.  Eyes: EOM are normal.  Neck: Normal range of motion. Neck supple. No tracheal deviation present. No thyromegaly present.  Cardiovascular: Normal rate, regular rhythm and normal heart sounds.   Pulmonary/Chest: Effort normal and breath sounds normal.  Abdominal: Soft.  Musculoskeletal: Normal range of motion.  Lymphadenopathy:    She has no cervical adenopathy.  Neurological: She is  alert and oriented to person, place, and time.  Skin: Skin is warm and dry.  Psychiatric: She has a normal mood and affect. Judgment normal.    ED Course  Procedures (including critical care time) Labs Review Labs Reviewed - No data to display Imaging Review No results found.  ECG interpretation   Date: 03/21/2014  Rate: 69  Rhythm: normal sinus rhythm  QRS Axis: normal  Intervals: normal  ST/T Wave abnormalities: normal  Conduction Disutrbances: none  Narrative Interpretation:   Old EKG Reviewed: No significant changes noted      MDM   Final diagnoses:  Otalgia of left ear  GERD (gastroesophageal reflux disease)    Pacesetter current symptoms for several weeks.  Patient had tympanostomy tubes 8 years ago.  Patient be referred back to your nose and throat surgery for further evaluation.  She may benefit from additional testing including the possibility of nasopharyngoscopy.  No indication for labs her CT imaging today.  No signs of infection.  Chest discomfort sounds like gastroesophageal reflux disease.  Resolved after GI cocktail.  EKG normal.  Doubt ACS.    Hoy Morn, MD 03/21/14 [phone]

## 2014-03-23 MED FILL — Hydrocodone-Acetaminophen Tab 5-325 MG: ORAL | Qty: 6 | Status: AC

## 2014-04-06 ENCOUNTER — Telehealth: Payer: Self-pay | Admitting: Family Medicine

## 2014-04-06 NOTE — Telephone Encounter (Signed)
Message copied by Alyson Locket on Tue Apr 06, 2014  4:59 PM ------      Message from: Devoria Glassing      Created: Tue Apr 06, 2014  4:24 PM       Patient is calling to see if she can get refill on her oxydodone       [phone] ------

## 2014-04-06 NOTE — Telephone Encounter (Signed)
ok 

## 2014-04-06 NOTE — Telephone Encounter (Signed)
?   OK to Refill  

## 2014-04-07 MED ORDER — OXYCODONE HCL 10 MG PO TABS: 10.0000 mg | ORAL_TABLET | Freq: Three times a day (TID) | ORAL | Status: DC | PRN

## 2014-04-07 NOTE — Telephone Encounter (Signed)
RX printed for provider signature

## 2014-04-23 ENCOUNTER — Emergency Department (HOSPITAL_COMMUNITY)
Admission: EM | Admit: 2014-04-23 | Discharge: 2014-04-23 | Disposition: A | Discharge status: Home / Self Care | Payer: Medicare HMO | Attending: Emergency Medicine | Admitting: Emergency Medicine

## 2014-04-23 ENCOUNTER — Encounter (HOSPITAL_COMMUNITY): Payer: Self-pay | Admitting: Emergency Medicine

## 2014-04-23 DIAGNOSIS — E785 Hyperlipidemia, unspecified: Secondary | ICD-10-CM | POA: Insufficient documentation

## 2014-04-23 DIAGNOSIS — Z8781 Personal history of (healed) traumatic fracture: Secondary | ICD-10-CM | POA: Insufficient documentation

## 2014-04-23 DIAGNOSIS — G8929 Other chronic pain: Secondary | ICD-10-CM | POA: Insufficient documentation

## 2014-04-23 DIAGNOSIS — Z862 Personal history of diseases of the blood and blood-forming organs and certain disorders involving the immune mechanism: Secondary | ICD-10-CM | POA: Insufficient documentation

## 2014-04-23 DIAGNOSIS — Z853 Personal history of malignant neoplasm of breast: Secondary | ICD-10-CM | POA: Insufficient documentation

## 2014-04-23 DIAGNOSIS — Z794 Long term (current) use of insulin: Secondary | ICD-10-CM | POA: Insufficient documentation

## 2014-04-23 DIAGNOSIS — Z8742 Personal history of other diseases of the female genital tract: Secondary | ICD-10-CM | POA: Insufficient documentation

## 2014-04-23 DIAGNOSIS — Z79899 Other long term (current) drug therapy: Secondary | ICD-10-CM | POA: Insufficient documentation

## 2014-04-23 DIAGNOSIS — Z8719 Personal history of other diseases of the digestive system: Secondary | ICD-10-CM | POA: Insufficient documentation

## 2014-04-23 DIAGNOSIS — M542 Cervicalgia: Secondary | ICD-10-CM | POA: Insufficient documentation

## 2014-04-23 DIAGNOSIS — Z8541 Personal history of malignant neoplasm of cervix uteri: Secondary | ICD-10-CM | POA: Insufficient documentation

## 2014-04-23 DIAGNOSIS — H9209 Otalgia, unspecified ear: Secondary | ICD-10-CM | POA: Insufficient documentation

## 2014-04-23 DIAGNOSIS — F410 Panic disorder [episodic paroxysmal anxiety] without agoraphobia: Secondary | ICD-10-CM | POA: Insufficient documentation

## 2014-04-23 DIAGNOSIS — IMO0002 Reserved for concepts with insufficient information to code with codable children: Secondary | ICD-10-CM | POA: Insufficient documentation

## 2014-04-23 DIAGNOSIS — E119 Type 2 diabetes mellitus without complications: Secondary | ICD-10-CM | POA: Insufficient documentation

## 2014-04-23 HISTORY — DX: Other chronic pain: G89.29

## 2014-04-23 NOTE — ED Notes (Signed)
Pt alert & oriented x4, stable gait. Patient  given discharge instructions, paperwork & prescription(s). Patient verbalized understanding. Pt left department w/ no further questions. 

## 2014-04-23 NOTE — Discharge Instructions (Signed)
You have neck pain, sore throat, and bilateral ear pain of uncertain cause. SEEK IMMEDIATE MEDICAL ATTENTION IF: You develop difficulties swallowing or breathing. You develop fever, severe headache, drooling, unable to swallow, neck swelling, or voice change.  You develop shortness of breath or chest pain. You have new or worse numbness, weakness, tingling, or movement problems in your arms or legs.  You develop increasing or severe pain.  You have change in bowel or bladder function, or other concerns.

## 2014-04-23 NOTE — ED Provider Notes (Signed)
CSN: [id number]     Arrival date & time 04/23/14  2106 History  This chart was scribed for Babette Relic, MD by Marcha Dutton, ED Scribe. This patient was seen in room APA06/APA06 and the patient's care was started at 9:47 PM.    Chief Complaint  Patient presents with  . Facial Pain  . Otalgia    The history is provided by the patient. No language interpreter was used.    HPI Comments: [name] is a 38 y.o. female who presents to the Emergency Department complaining of constant, waxing and waning pain in her face, sinus congestion, throat and ears for three months. She states the facial pain has resolved now she has pain in both ears and her throat. She also reports sinus congestion and drainage but without pain. She has been on at least 3 different antibiotics and steroids with no improvement. She states she finished her antibiotics a few weeks ago. Pt states she went to the dentist last week who said her teeth and TMJs were okay. Pt denies fever, confusion, cough, SOB, nausea, and vomiting. She reports her sugars have been going lower lately. Pt has been referred to ENT and appointment in June. She states she takes zyrtec regularly for her seasonal allergies.    Past Medical History  Diagnosis Date  . Chronic anemia   . Panic disorder   . Crohn's disease   . Anxiety     OCD  . 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 Fracture 05/01/2013    Right foot, in cast  . Hypothyroidism     parathroid runs low  . History of blood transfusion 2 years ago  . Cancer 2001    renal cell , breast cancer both breast 2011, cervical cancer 2013  . Complication of anesthesia     hard to sedate dr hung aware  . PONV (postoperative nausea and vomiting)   . Renal disorder     S/p '11-Kidney transplant-Dr. Florene Glen follows  . Hyperlipidemia   . Chronic pain     Past Surgical History  Procedure Laterality Date  . Nephrectomy transplanted organ  1999 and 2011    last '11-[facility]  .  Parathyroidectomy  02-20-2001  . Knee surgery Left april 2003    arthroscopy  . Cystoscopy  02/13/2012    Procedure: CYSTOSCOPY;  Surgeon: Franchot Gallo, MD;  Location: [facility];  Service: Urology;  Laterality: N/A;  insertion of ureteral catheter , removed per Dr Diona Fanti   . Tubal ligation  02-19-2000  . Abdominal hysterectomy  02/13/2012    Procedure: HYSTERECTOMY ABDOMINAL;  Surgeon: Cyril Mourning, MD;  Location: [facility];  Service: Gynecology;  Laterality: N/A;  . Breast lumpectomy and reconstruction Bilateral 2011  . Esophageal manometry N/A 08/10/2013    Procedure: ESOPHAGEAL MANOMETRY (EM);  Surgeon: Beryle Beams, MD;  [facility]: [facility];  Service: Endoscopy;  Laterality: N/A;  . Incision and drainage abscess N/A 08/26/2013    Procedure: INCISION AND DRAINAGE PERIRECTAL ABSCESS;  Surgeon: Jamesetta So, MD;  Location: [facility];  Service: General;  Laterality: N/A;  . Esophagogastroduodenoscopy N/A 09/25/2013    Procedure: ESOPHAGOGASTRODUODENOSCOPY (EGD);  Surgeon: Beryle Beams, MD;  Location: [facility];  Service: Endoscopy;  Laterality: N/A;  . Colonoscopy N/A 09/25/2013    Procedure: COLONOSCOPY;  Surgeon: Beryle Beams, MD;  [facility]: [facility];  Service: Endoscopy;  Laterality: N/A;  . Groin dissection Left 10/09/2013    Procedure: INCISION AND  DRAINAGE OF LEFT GROIN ABSCESS;  Surgeon: Shann Medal, MD;  Location: [facility];  Service: General;  Laterality: Left;  . Breast surgery      bilateral lumpectomy 4'11  . Esophagogastroduodenoscopy N/A 11/27/2013    Procedure: ESOPHAGOGASTRODUODENOSCOPY (EGD);  Surgeon: Beryle Beams, MD;  Location: [facility];  Service: Endoscopy;  Laterality: N/A;  . Bravo ph study N/A 11/27/2013    Procedure: BRAVO [place] STUDY;  Surgeon: Beryle Beams, MD;  [facility];  Service: Endoscopy;  Laterality: N/A;  . Esophagogastroduodenoscopy N/A 12/11/2013    Procedure: ESOPHAGOGASTRODUODENOSCOPY (EGD);  Surgeon: Beryle Beams, MD;   Location: [facility];  Service: Endoscopy;  Laterality: N/A;  . Bravo ph study N/A 12/11/2013    Procedure: BRAVO [place] STUDY;  Surgeon: Beryle Beams, MD;  [facility];  Service: Endoscopy;  Laterality: N/A;  . Lower extremity arterial dopllers  05/06/2012    Bilat ABIs were attempted-not accessible due to calcified veins. Bilat TBIs demonstrated no obtainable flow through both great toes. Bilat PVR ankle regions demonstrated moderately abnormal pulsatile flow. Lft SFA demonstrated a 50-69% diameter reduction. Lft runoff demonstrated occlusive diseasewith reconstitution of flow noted distally.    Family History  Problem Relation Age of Onset  . Diabetes Mother   . Hypertension Mother   . Diabetes Father   . Hypertension Father   . Cancer Father     Prostate cancer  . Obesity Sister   . Stroke Maternal Grandmother   . Heart attack Maternal Grandfather   . Polycystic ovary syndrome Sister     History  Substance Use Topics  . Smoking status: Never Smoker   . Smokeless tobacco: Never Used  . Alcohol Use: No    OB History   Grav Para Term Preterm Abortions TAB SAB Ect Mult Living                  Review of Systems 10 Systems reviewed and all are negative for acute change except as noted in the HPI.   Allergies  Barium-containing compounds; Dilaudid; Hydromorphone; Ivp dye; Shellfish allergy; Vancomycin; Adhesive; Infed; Sulfa antibiotics; and Iopamidol  Home Medications    Prior to Admission medications   Medication Sig Start Date End Date Taking? Authorizing Provider  atorvastatin (LIPITOR) 20 MG tablet Take 20 mg by mouth at bedtime.    Historical Provider, MD  busPIRone (BUSPAR) 15 MG tablet Take 15 mg by mouth 2 (two) times daily.  05/05/13   Elmarie Shiley, NP  calcitRIOL (ROCALTROL) 0.25 MCG capsule Take 1.75 mcg by mouth every morning. 05/05/13   Elmarie Shiley, NP  calcium carbonate (OS-CAL - DOSED IN MG OF ELEMENTAL CALCIUM) 1250 MG tablet Take 1 tablet by  mouth 3 (three) times daily. 05/05/13   Elmarie Shiley, NP  cetirizine (ZYRTEC) 10 MG tablet Take 10 mg by mouth every evening. 05/05/13   Elmarie Shiley, NP  cilostazol (PLETAL) 50 MG tablet Take 1 tablet (50 mg total) by mouth 2 (two) times daily. 02/24/14   Lorretta Harp, MD  cyclobenzaprine (FLEXERIL) 10 MG tablet Take 1 tablet (10 mg total) by mouth 3 (three) times daily as needed for muscle spasms. 10/05/13   Susy Frizzle, MD  FLUoxetine (PROZAC) 20 MG capsule Take 60 mg by mouth every morning.    Historical Provider, MD  hydrOXYzine (ATARAX/VISTARIL) 25 MG tablet Take 50 mg by mouth at bedtime.     Historical Provider, MD  insulin aspart (NOVOLOG FLEXPEN) 100 UNIT/ML SOPN  FlexPen Inject 15 Units into the skin 3 (three) times daily with meals.     Historical Provider, MD  insulin glargine (LANTUS) 100 UNIT/ML injection Inject 45 Units into the skin at bedtime.  05/15/13   Susy Frizzle, MD  metoCLOPramide (REGLAN) 5 MG tablet Take 5 mg by mouth 3 (three) times daily. Frequency unspecified 05/05/13   Elmarie Shiley, NP  mycophenolate (MYFORTIC) 180 MG EC tablet Take 540 mg by mouth 2 (two) times daily. 05/05/13   Elmarie Shiley, NP  omeprazole (PRILOSEC) 40 MG capsule Take 40 mg by mouth 2 (two) times daily.     Historical Provider, MD  Oxycodone HCl 10 MG TABS Take 1 tablet (10 mg total) by mouth 3 (three) times daily as needed (pain). For pain 04/07/14   Susy Frizzle, MD  predniSONE (DELTASONE) 10 MG tablet Take 10 mg by mouth daily with breakfast.     Historical Provider, MD  SUMAtriptan (IMITREX) 50 MG tablet May repeat in 2 hours if headache persists or recurs x 1 dose 01/26/14   Susy Frizzle, MD  tacrolimus (PROGRAF) 0.5 MG capsule Take 3.5 mg by mouth 2 (two) times daily. Take with 1mg  capsules for total of 3.5mg  total dose twice daily 05/05/13   Elmarie Shiley, NP  tiotropium (SPIRIVA) 18 MCG inhalation capsule Place 18 mcg into inhaler and inhale daily.    Historical Provider, MD    Triage  Vitals: BP 118/72  Pulse 83  Temp(Src) 98.4 F (36.9 C) (Oral)  Resp 20  Ht 5\' 3"  (1.6 m)  Wt 250 lb (113.399 kg)  BMI 44.30 kg/m2  SpO2 98%  LMP 01/30/2012   Physical Exam  Nursing note and vitals reviewed. Constitutional: She is oriented to person, place, and time. She appears well-developed and well-nourished. No distress.  Awake, alert, nontoxic appearance.  HENT:  Head: Atraumatic.  Right Ear: Hearing, tympanic membrane, external ear and ear canal normal. No swelling. Tympanic membrane is not erythematous.  Left Ear: Hearing, tympanic membrane, external ear and ear canal normal. No swelling. Tympanic membrane is not erythematous.  Mouth/Throat: Uvula is midline and oropharynx is clear and moist. No oropharyngeal exudate, posterior oropharyngeal edema, posterior oropharyngeal erythema or tonsillar abscesses.  Mild nasal congestion both sides, both open, nares are patent with no discharge currently, No mastoid tenderness   Eyes: Right eye exhibits no discharge. Left eye exhibits no discharge.  Conjunctiva clear  Neck: Normal range of motion. Neck supple. Muscular tenderness present. No edema and no erythema present.  Bilateral SCM tenderness without swelling erythema or lymphadenopathy, no stridor, drooling, trismus, or neck swelling  Cardiovascular: Normal rate, regular rhythm and normal heart sounds.  Exam reveals no gallop and no friction rub.   No murmur heard. Pulmonary/Chest: Effort normal and breath sounds normal. No stridor. No respiratory distress. She has no wheezes. She has no rales. She exhibits no tenderness.  Abdominal: Soft. Bowel sounds are normal. There is no tenderness. There is no rebound.  Musculoskeletal: She exhibits no tenderness.  Baseline ROM, no obvious new focal weakness.  Lymphadenopathy:    She has no cervical adenopathy.  Neurological: She is alert and oriented to person, place, and time.  Mental status and motor strength appears baseline for  patient and situation.  Skin: No rash noted. She is not diaphoretic.  Psychiatric: She has a normal mood and affect.    ED Course  Procedures (including critical care time)  DIAGNOSTIC STUDIES: Oxygen Saturation is 98% on RA, normal by  my interpretation.    COORDINATION OF CARE: 10:02 PM - Patient informed of clinical course, understand medical decision-making process, and agree with plan.   Labs Review Labs Reviewed - No data to display  Imaging Review No results found.   EKG Interpretation None      MDM  I doubt any other EMC precluding discharge at this time including, but not necessarily limited to the following:SBI.  Final diagnoses:  Chronic neck pain      I personally performed the services described in this documentation, which was scribed in my presence. The recorded information has been reviewed and is accurate.    Babette Relic, MD 04/28/14 [phone]

## 2014-04-23 NOTE — ED Notes (Signed)
Pt reports on going problems w/ sinuses, ears hurting & back aching. Pt has been treated for sinus infection, has appt for ENT in June.

## 2014-05-03 ENCOUNTER — Other Ambulatory Visit: Payer: Self-pay | Admitting: Family Medicine

## 2014-05-03 ENCOUNTER — Telehealth: Payer: Self-pay | Admitting: Family Medicine

## 2014-05-03 NOTE — Telephone Encounter (Signed)
Patient is calling to get refill on her oxycodone please call her back at [phone]

## 2014-05-04 MED ORDER — OXYCODONE HCL 10 MG PO TABS: 10.0000 mg | ORAL_TABLET | Freq: Three times a day (TID) | ORAL | Status: DC | PRN

## 2014-05-04 NOTE — Telephone Encounter (Signed)
Prescription printed and patient made aware to come to office to pick up.  

## 2014-05-04 NOTE — Telephone Encounter (Signed)
ok 

## 2014-05-04 NOTE — Telephone Encounter (Signed)
Medication refilled per protocol. 

## 2014-05-04 NOTE — Telephone Encounter (Signed)
?   OK to Refill  

## 2014-05-05 ENCOUNTER — Other Ambulatory Visit: Payer: Self-pay

## 2014-05-05 NOTE — Telephone Encounter (Signed)
Patient needs an appointment. She hasn't been seen since June 2013 and has had several warnings. She did schedule an appointment but cancelled.  Refills request for Pletal has been denied.

## 2014-06-02 ENCOUNTER — Telehealth: Payer: Self-pay | Admitting: Family Medicine

## 2014-06-02 NOTE — Telephone Encounter (Signed)
?   OK to Refill  

## 2014-06-02 NOTE — Telephone Encounter (Signed)
[phone] Patient is calling for refill on her oxycodone

## 2014-06-03 MED ORDER — OXYCODONE HCL 10 MG PO TABS: 10.0000 mg | ORAL_TABLET | Freq: Three times a day (TID) | ORAL | Status: DC | PRN

## 2014-06-03 NOTE — Telephone Encounter (Signed)
ok 

## 2014-06-03 NOTE — Telephone Encounter (Signed)
RX printed, left up front and patient aware to pick up per vm 

## 2014-07-01 ENCOUNTER — Telehealth: Payer: Self-pay | Admitting: Family Medicine

## 2014-07-01 NOTE — Telephone Encounter (Signed)
Patient is calling to get rx for oxycodone  [phone] when ready

## 2014-07-02 MED ORDER — OXYCODONE HCL 10 MG PO TABS: 10.0000 mg | ORAL_TABLET | Freq: Three times a day (TID) | ORAL | Status: DC | PRN

## 2014-07-02 NOTE — Telephone Encounter (Signed)
ok 

## 2014-07-02 NOTE — Telephone Encounter (Signed)
?   OK to Refill  

## 2014-07-02 NOTE — Telephone Encounter (Signed)
RX printed, left up front and patient aware to pick up  

## 2014-07-16 ENCOUNTER — Telehealth: Payer: Self-pay | Admitting: *Deleted

## 2014-07-16 NOTE — Telephone Encounter (Signed)
Received call from nurse at [facility] stating pt is have a gastric sleeve done and needs [employer] referral from PCP, I submitted referral thru acuity connect with authorization pending..once received will fax to [facility] at [phone].

## 2014-07-21 NOTE — Telephone Encounter (Signed)
Received fax from [facility] care mgmt stating that pt has been admitted to acute care hospital at [facility] for diagnosis of morbid obesity admitting [place].

## 2014-07-30 ENCOUNTER — Inpatient Hospital Stay: Payer: Medicare HMO | Admitting: Family Medicine

## 2014-08-04 ENCOUNTER — Telehealth: Payer: Self-pay | Admitting: Family Medicine

## 2014-08-04 NOTE — Telephone Encounter (Signed)
?   OK to Refill  

## 2014-08-04 NOTE — Telephone Encounter (Signed)
Patient callling for rx for her oxycodone  [phone]

## 2014-08-05 NOTE — Telephone Encounter (Signed)
ok 

## 2014-08-06 MED ORDER — OXYCODONE HCL 10 MG PO TABS: 10.0000 mg | ORAL_TABLET | Freq: Three times a day (TID) | ORAL | Status: DC | PRN

## 2014-08-06 NOTE — Telephone Encounter (Signed)
RX printed, left up front and patient aware to pick up per vm 

## 2014-08-09 ENCOUNTER — Telehealth: Payer: Self-pay | Admitting: Family Medicine

## 2014-08-09 NOTE — Telephone Encounter (Signed)
Patient missed her appointment on the 7th with dr pickard, would like to know if she still needs to come in  [phone]

## 2014-08-10 NOTE — Telephone Encounter (Signed)
Pt aware and appt made.

## 2014-08-10 NOTE — Telephone Encounter (Signed)
Last seen 01/07/14 for pain management. Pt has been in since then but only for sick visits.

## 2014-08-10 NOTE — Telephone Encounter (Signed)
yes

## 2014-08-17 ENCOUNTER — Encounter: Payer: Self-pay | Admitting: Family Medicine

## 2014-08-17 ENCOUNTER — Ambulatory Visit (INDEPENDENT_AMBULATORY_CARE_PROVIDER_SITE_OTHER): Payer: Commercial Managed Care - HMO | Admitting: Family Medicine

## 2014-08-17 VITALS — BP 110/68 | HR 78 | Temp 98.4°F | Resp 18 | Ht 63.0 in | Wt 221.0 lb

## 2014-08-17 DIAGNOSIS — IMO0001 Reserved for inherently not codable concepts without codable children: Secondary | ICD-10-CM

## 2014-08-17 DIAGNOSIS — E1165 Type 2 diabetes mellitus with hyperglycemia: Principal | ICD-10-CM

## 2014-08-17 NOTE — Progress Notes (Signed)
Subjective:    Patient ID: [name], female    DOB: [birth year], 38 y.o.   MRN: [id number]  HPI Patient is a 38 year old African American female who underwent sleeve gastroplasty July 28 at [facility] for  recalcitrant GERD and hiatal hernia.  Patient's symptoms of reflux have improved. The patient has lost 14 pounds since the surgery. She has just been cleared to eat solid foods. Because of the surgery she has temporarily discontinued NovoLog which she was taking 15 units 3 times a day meals previously.  She is still currently on Lantus 35 units subcutaneous daily. She states her fasting blood sugars are 70-100. She denies any hypoglycemia. She denies any numbness or tingling in the feet. She denies any blurry vision. She denies any chest pain shortness of breath or dyspnea on exertion. She continues to take oxycodone 10 mg by mouth 3 times a day for chronic low back pain, knee pain.  Her blood pressures currently well controlled at 110/68. She recently had a renal function tested in the hospital found to have a creatinine of 1.3 which is much improved from the 1.9 she had in early May. Past Medical History  Diagnosis Date  . Chronic anemia   . Panic disorder   . Crohn's disease   . Anxiety     OCD  . 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 Fracture 05/01/2013    Right foot, in cast  . Hypothyroidism     parathroid runs low  . History of blood transfusion 2 years ago  . Cancer 2001    renal cell , breast cancer both breast 2011, cervical cancer 2013  . Complication of anesthesia     hard to sedate dr hung aware  . PONV (postoperative nausea and vomiting)   . Renal disorder     S/p '11-Kidney transplant-Dr. Florene Glen follows  . Hyperlipidemia   . Chronic pain    Past Surgical History  Procedure Laterality Date  . Nephrectomy transplanted organ  1999 and 2011    last '11-[facility]  . Parathyroidectomy  02-20-2001  . Knee surgery Left april 2003    arthroscopy  . Cystoscopy   02/13/2012    Procedure: CYSTOSCOPY;  Surgeon: Franchot Gallo, MD;  Location: [facility];  Service: Urology;  Laterality: N/A;  insertion of ureteral catheter , removed per Dr Diona Fanti   . Tubal ligation  02-19-2000  . Abdominal hysterectomy  02/13/2012    Procedure: HYSTERECTOMY ABDOMINAL;  Surgeon: Cyril Mourning, MD;  Location: [facility] [place] [facility];  Service: Gynecology;  Laterality: N/A;  . Breast lumpectomy and reconstruction Bilateral 2011  . Esophageal manometry N/A 08/10/2013    Procedure: ESOPHAGEAL MANOMETRY (EM);  Surgeon: Beryle Beams, MD;  [facility];  Service: Endoscopy;  Laterality: N/A;  . Incision and drainage abscess N/A 08/26/2013    Procedure: INCISION AND DRAINAGE PERIRECTAL ABSCESS;  Surgeon: Jamesetta So, MD;  Location: [facility];  Service: General;  Laterality: N/A;  . Esophagogastroduodenoscopy N/A 09/25/2013    Procedure: ESOPHAGOGASTRODUODENOSCOPY (EGD);  Surgeon: Beryle Beams, MD;  Location: [facility];  Service: Endoscopy;  Laterality: N/A;  . Colonoscopy N/A 09/25/2013    Procedure: COLONOSCOPY;  Surgeon: Beryle Beams, MD;  [facility];  Service: Endoscopy;  Laterality: N/A;  . Groin dissection Left 10/09/2013    Procedure: INCISION AND DRAINAGE OF LEFT GROIN ABSCESS;  Surgeon: Shann Medal, MD;  [facility];  Service: General;  Laterality: Left;  . Breast  surgery      bilateral lumpectomy 4'11  . Esophagogastroduodenoscopy N/A 11/27/2013    Procedure: ESOPHAGOGASTRODUODENOSCOPY (EGD);  Surgeon: Beryle Beams, MD;  Location: [facility];  Service: Endoscopy;  Laterality: N/A;  . Bravo ph study N/A 11/27/2013    Procedure: BRAVO [place] STUDY;  Surgeon: Beryle Beams, MD;  [facility];  Service: Endoscopy;  Laterality: N/A;  . Esophagogastroduodenoscopy N/A 12/11/2013    Procedure: ESOPHAGOGASTRODUODENOSCOPY (EGD);  Surgeon: Beryle Beams, MD;  Location: [facility];  Service: Endoscopy;  Laterality: N/A;  . Bravo ph study N/A 12/11/2013     Procedure: BRAVO [place] STUDY;  Surgeon: Beryle Beams, MD;  [facility];  Service: Endoscopy;  Laterality: N/A;  . Lower extremity arterial dopllers  05/06/2012    Bilat ABIs were attempted-not accessible due to calcified veins. Bilat TBIs demonstrated no obtainable flow through both great toes. Bilat PVR ankle regions demonstrated moderately abnormal pulsatile flow. Lft SFA demonstrated a 50-69% diameter reduction. Lft runoff demonstrated occlusive diseasewith reconstitution of flow noted distally.  . Sleeve gastroplasty      for GERD, hiatal hernia   Current Outpatient Prescriptions on File Prior to Visit  Medication Sig Dispense Refill  . atorvastatin (LIPITOR) 20 MG tablet Take 20 mg by mouth at bedtime.      . busPIRone (BUSPAR) 15 MG tablet Take 15 mg by mouth 3 (three) times daily.       . calcitRIOL (ROCALTROL) 0.25 MCG capsule Take 1.75 mcg by mouth every morning. Patient states that she takes 6 total      . calcium carbonate (OS-CAL - DOSED IN MG OF ELEMENTAL CALCIUM) 1250 MG tablet Take 1 tablet by mouth 3 (three) times daily.      . calcium carbonate (TUMS - DOSED IN MG ELEMENTAL CALCIUM) 500 MG chewable tablet Chew 1 tablet by mouth 3 (three) times daily.      . cetirizine (ZYRTEC) 10 MG tablet Take 10 mg by mouth every evening.      . cilostazol (PLETAL) 50 MG tablet Take 1 tablet (50 mg total) by mouth 2 (two) times daily.  30 tablet  0  . cyclobenzaprine (FLEXERIL) 10 MG tablet Take 1 tablet (10 mg total) by mouth 3 (three) times daily as needed for muscle spasms.  60 tablet  0  . FLUoxetine (PROZAC) 20 MG capsule Take 60 mg by mouth every morning.      . hydrOXYzine (ATARAX/VISTARIL) 25 MG tablet Take 25 mg by mouth at bedtime.       . insulin glargine (LANTUS) 100 UNIT/ML injection Inject 45 Units into the skin at bedtime.       Marland Kitchen LINZESS 145 MCG CAPS capsule TAKE ONE CAPSULE BY MOUTH EVERY DAY.  30 capsule  5  . metoCLOPramide (REGLAN) 5 MG tablet Take 5 mg by mouth  3 (three) times daily. Frequency unspecified      . mycophenolate (MYFORTIC) 180 MG EC tablet Take 540 mg by mouth 2 (two) times daily.      Marland Kitchen omeprazole (PRILOSEC) 40 MG capsule Take 40 mg by mouth 2 (two) times daily.       . Oxycodone HCl 10 MG TABS Take 1 tablet (10 mg total) by mouth 3 (three) times daily as needed (pain). For pain  90 tablet  0  . predniSONE (DELTASONE) 10 MG tablet Take 10 mg by mouth daily with breakfast.       . SUMAtriptan (IMITREX) 50 MG tablet May repeat in  2 hours if headache persists or recurs x 1 dose  10 tablet  0  . tacrolimus (PROGRAF) 1 MG capsule Take 3 mg by mouth 2 (two) times daily.      Marland Kitchen tiotropium (SPIRIVA) 18 MCG inhalation capsule Place 18 mcg into inhaler and inhale daily.      . insulin aspart (NOVOLOG FLEXPEN) 100 UNIT/ML SOPN FlexPen Inject 15 Units into the skin 3 (three) times daily with meals.        No current facility-administered medications on file prior to visit.   Allergies  Allergen Reactions  . Barium-Containing Compounds Shortness Of Breath  . Dilaudid [Hydromorphone Hcl] Anaphylaxis  . Hydromorphone Shortness Of Breath  . Ivp Dye [Iodinated Diagnostic Agents] Anaphylaxis  . Shellfish Allergy Shortness Of Breath and Itching    Causes red, patchy areas to form  . Vancomycin Anaphylaxis  . Adhesive [Tape] Other (See Comments)    Dermatitis   . Infed [Iron Dextran] Itching  . Sulfa Antibiotics Hives  . Iopamidol Itching and Rash   History   Social History  . Marital Status: Single    Spouse Name: N/A    Number of Children: N/A  . Years of Education: N/A   Occupational History  . Not on file.   Social History Main Topics  . Smoking status: Never Smoker   . Smokeless tobacco: Never Used  . Alcohol Use: No  . Drug Use: No  . Sexual Activity: Not Currently   Other Topics Concern  . Not on file   Social History Narrative  . No narrative on file      Review of Systems  All other systems reviewed and are  negative.      Objective:   Physical Exam  Vitals reviewed. Neck: Neck supple. No JVD present. No thyromegaly present.  Cardiovascular: Normal rate, regular rhythm and normal heart sounds.   No murmur heard. Pulmonary/Chest: Effort normal and breath sounds normal. No respiratory distress. She has no wheezes. She has no rales.  Abdominal: Soft. Bowel sounds are normal. She exhibits no distension. There is no tenderness. There is no rebound and no guarding.  Musculoskeletal: She exhibits no edema.  Lymphadenopathy:    She has no cervical adenopathy.          Assessment & Plan:  Type II or unspecified type diabetes mellitus without mention of complication, uncontrolled - Plan: COMPLETE METABOLIC PANEL WITH GFR, Hemoglobin A1c, CBC with Differential  Check the patient's hemoglobin A1c. I also check a CBC. [place] hemoglobin A1c is less than 7. Patient is seeing her nephrologist regularly. Her diabetic eye exam is up-to-date. I did recommend she begin taking an aspirin 81 mg by mouth daily cardiovascular protection. Diabetic foot exam is performed today. She is chronic postsurgical changes on the tip of her left great toe. Otherwise she is neurovascularly intact in the foot with no evidence of neuropathy and peripheral vascular disease.

## 2014-08-18 LAB — COMPLETE METABOLIC PANEL WITH GFR
ALT: 9 U/L (ref 0–35)
AST: 13 U/L (ref 0–37)
Albumin: 4.7 g/dL (ref 3.5–5.2)
Alkaline Phosphatase: 81 U/L (ref 39–117)
BUN: 13 mg/dL (ref 6–23)
CO2: 16 mEq/L — ABNORMAL LOW (ref 19–32)
Calcium: 7.6 mg/dL — ABNORMAL LOW (ref 8.4–10.5)
Chloride: 103 mEq/L (ref 96–112)
Creat: 1.54 mg/dL — ABNORMAL HIGH (ref 0.50–1.10)
GFR, Est African American: 48 mL/min — ABNORMAL LOW
GFR, Est Non African American: 42 mL/min — ABNORMAL LOW
Glucose, Bld: 112 mg/dL — ABNORMAL HIGH (ref 70–99)
Potassium: 3.9 mEq/L (ref 3.5–5.3)
Sodium: 137 mEq/L (ref 135–145)
Total Bilirubin: 0.7 mg/dL (ref 0.2–1.2)
Total Protein: 7.5 g/dL (ref 6.0–8.3)

## 2014-08-18 LAB — HEMOGLOBIN A1C
Hgb A1c MFr Bld: 7.5 % — ABNORMAL HIGH (ref <5.7)
Mean Plasma Glucose: 169 mg/dL — ABNORMAL HIGH (ref <117)

## 2014-08-18 LAB — CBC WITH DIFFERENTIAL/PLATELET
Basophils Absolute: 0 10*3/uL (ref 0.0–0.1)
Basophils Relative: 0 % (ref 0–1)
Eosinophils Absolute: 0.1 10*3/uL (ref 0.0–0.7)
Eosinophils Relative: 2 % (ref 0–5)
HCT: 37.1 % (ref 36.0–46.0)
Hemoglobin: 12.5 g/dL (ref 12.0–15.0)
Lymphocytes Relative: 33 % (ref 12–46)
Lymphs Abs: 1.7 10*3/uL (ref 0.7–4.0)
MCH: 27.2 pg (ref 26.0–34.0)
MCHC: 33.7 g/dL (ref 30.0–36.0)
MCV: 80.7 fL (ref 78.0–100.0)
Monocytes Absolute: 0.6 10*3/uL (ref 0.1–1.0)
Monocytes Relative: 11 % (ref 3–12)
Neutro Abs: 2.8 10*3/uL (ref 1.7–7.7)
Neutrophils Relative %: 54 % (ref 43–77)
Platelets: 365 10*3/uL (ref 150–400)
RBC: 4.6 MIL/uL (ref 3.87–5.11)
RDW: 14.8 % (ref 11.5–15.5)
WBC: 5.1 10*3/uL (ref 4.0–10.5)

## 2014-08-19 ENCOUNTER — Encounter: Payer: Self-pay | Admitting: Family Medicine

## 2014-09-06 ENCOUNTER — Telehealth: Payer: Self-pay | Admitting: Family Medicine

## 2014-09-06 MED ORDER — OXYCODONE HCL 10 MG PO TABS: 10.0000 mg | ORAL_TABLET | Freq: Three times a day (TID) | ORAL | Status: DC | PRN

## 2014-09-06 NOTE — Telephone Encounter (Signed)
It is Oxycodone - wrong medication requested - RX printed, left up front and patient aware to pick up via vm

## 2014-09-06 NOTE — Telephone Encounter (Signed)
Patient is calling to get rx for her hydrocodone  [phone]

## 2014-09-06 NOTE — Telephone Encounter (Signed)
Ok but I thought she was on oxycodone

## 2014-09-06 NOTE — Telephone Encounter (Signed)
?   OK to Refill  

## 2014-09-24 ENCOUNTER — Emergency Department (HOSPITAL_COMMUNITY)
Admission: EM | Admit: 2014-09-24 | Discharge: 2014-09-24 | Discharge status: Against Medical Advice | Payer: Medicare HMO | Attending: Emergency Medicine | Admitting: Emergency Medicine

## 2014-09-24 ENCOUNTER — Encounter (HOSPITAL_COMMUNITY): Payer: Self-pay | Admitting: Emergency Medicine

## 2014-09-24 DIAGNOSIS — R110 Nausea: Secondary | ICD-10-CM | POA: Diagnosis not present

## 2014-09-24 DIAGNOSIS — E119 Type 2 diabetes mellitus without complications: Secondary | ICD-10-CM | POA: Insufficient documentation

## 2014-09-24 DIAGNOSIS — R42 Dizziness and giddiness: Secondary | ICD-10-CM | POA: Diagnosis not present

## 2014-09-24 NOTE — ED Notes (Signed)
Pt decided to leave w/o being seen after triage.  Pt reported that she had "to leave to pick-up the kids, because the school will call the police, if I am not there."  Pt understands the risks of leaving and sts that she is planning on returning later.

## 2014-09-24 NOTE — ED Notes (Signed)
Pt has hx of feeling nauseated and being dizzy.  DX unknown.  Pt states that at about 2pm, same symptoms occurred.  Normally lies down and resolves.  Continued nausea at this time.  No pain.

## 2014-09-26 NOTE — ED Provider Notes (Signed)
[facility]  Debby Freiberg, MD 09/26/14 [phone]

## 2014-10-04 ENCOUNTER — Telehealth: Payer: Self-pay | Admitting: Physician Assistant

## 2014-10-04 MED ORDER — OXYCODONE HCL 10 MG PO TABS: 10.0000 mg | ORAL_TABLET | Freq: Three times a day (TID) | ORAL | Status: DC | PRN

## 2014-10-04 NOTE — Telephone Encounter (Signed)
Ok to refill??  Last office visit 08/17/2014.  Last refill 09/06/2014.

## 2014-10-04 NOTE — Telephone Encounter (Signed)
ok 

## 2014-10-04 NOTE — Telephone Encounter (Signed)
Prescription printed and patient made aware to come to office to pick up.  

## 2014-10-04 NOTE — Telephone Encounter (Signed)
Patient needs rx for her oxycodone  Please call [phone] when ready

## 2014-11-07 ENCOUNTER — Emergency Department (HOSPITAL_COMMUNITY)
Admission: EM | Admit: 2014-11-07 | Discharge: 2014-11-07 | Disposition: A | Discharge status: Home / Self Care | Payer: Medicare HMO | Attending: Emergency Medicine | Admitting: Emergency Medicine

## 2014-11-07 ENCOUNTER — Encounter (HOSPITAL_COMMUNITY): Payer: Self-pay | Admitting: Emergency Medicine

## 2014-11-07 ENCOUNTER — Emergency Department (HOSPITAL_COMMUNITY): Payer: Medicare HMO

## 2014-11-07 DIAGNOSIS — R0789 Other chest pain: Secondary | ICD-10-CM | POA: Diagnosis not present

## 2014-11-07 DIAGNOSIS — Z794 Long term (current) use of insulin: Secondary | ICD-10-CM | POA: Diagnosis not present

## 2014-11-07 DIAGNOSIS — Z862 Personal history of diseases of the blood and blood-forming organs and certain disorders involving the immune mechanism: Secondary | ICD-10-CM | POA: Insufficient documentation

## 2014-11-07 DIAGNOSIS — E785 Hyperlipidemia, unspecified: Secondary | ICD-10-CM | POA: Diagnosis not present

## 2014-11-07 DIAGNOSIS — E119 Type 2 diabetes mellitus without complications: Secondary | ICD-10-CM | POA: Diagnosis not present

## 2014-11-07 DIAGNOSIS — I868 Varicose veins of other specified sites: Secondary | ICD-10-CM | POA: Diagnosis not present

## 2014-11-07 DIAGNOSIS — Z7952 Long term (current) use of systemic steroids: Secondary | ICD-10-CM | POA: Diagnosis not present

## 2014-11-07 DIAGNOSIS — Z8781 Personal history of (healed) traumatic fracture: Secondary | ICD-10-CM | POA: Diagnosis not present

## 2014-11-07 DIAGNOSIS — R609 Edema, unspecified: Secondary | ICD-10-CM

## 2014-11-07 DIAGNOSIS — G8929 Other chronic pain: Secondary | ICD-10-CM | POA: Diagnosis not present

## 2014-11-07 DIAGNOSIS — R112 Nausea with vomiting, unspecified: Secondary | ICD-10-CM | POA: Insufficient documentation

## 2014-11-07 DIAGNOSIS — Z8719 Personal history of other diseases of the digestive system: Secondary | ICD-10-CM | POA: Diagnosis not present

## 2014-11-07 DIAGNOSIS — I8390 Asymptomatic varicose veins of unspecified lower extremity: Secondary | ICD-10-CM

## 2014-11-07 DIAGNOSIS — Z853 Personal history of malignant neoplasm of breast: Secondary | ICD-10-CM | POA: Diagnosis not present

## 2014-11-07 DIAGNOSIS — F419 Anxiety disorder, unspecified: Secondary | ICD-10-CM | POA: Diagnosis not present

## 2014-11-07 DIAGNOSIS — R079 Chest pain, unspecified: Secondary | ICD-10-CM | POA: Diagnosis present

## 2014-11-07 DIAGNOSIS — Z79899 Other long term (current) drug therapy: Secondary | ICD-10-CM | POA: Diagnosis not present

## 2014-11-07 DIAGNOSIS — Z8742 Personal history of other diseases of the female genital tract: Secondary | ICD-10-CM | POA: Diagnosis not present

## 2014-11-07 LAB — CBC
HCT: 41.9 % (ref 36.0–46.0)
Hemoglobin: 13.2 g/dL (ref 12.0–15.0)
MCH: 26.9 pg (ref 26.0–34.0)
MCHC: 31.5 g/dL (ref 30.0–36.0)
MCV: 85.5 fL (ref 78.0–100.0)
Platelets: 263 10*3/uL (ref 150–400)
RBC: 4.9 MIL/uL (ref 3.87–5.11)
RDW: 15.7 % — ABNORMAL HIGH (ref 11.5–15.5)
WBC: 3.3 10*3/uL — ABNORMAL LOW (ref 4.0–10.5)

## 2014-11-07 LAB — I-STAT TROPONIN, ED: Troponin i, poc: 0 ng/mL (ref 0.00–0.08)

## 2014-11-07 LAB — BASIC METABOLIC PANEL
Anion gap: 15 (ref 5–15)
BUN: 13 mg/dL (ref 6–23)
CO2: 21 mEq/L (ref 19–32)
Calcium: 7.6 mg/dL — ABNORMAL LOW (ref 8.4–10.5)
Chloride: 102 mEq/L (ref 96–112)
Creatinine, Ser: 1.33 mg/dL — ABNORMAL HIGH (ref 0.50–1.10)
GFR calc Af Amer: 57 mL/min — ABNORMAL LOW (ref >90)
GFR calc non Af Amer: 49 mL/min — ABNORMAL LOW (ref >90)
Glucose, Bld: 178 mg/dL — ABNORMAL HIGH (ref 70–99)
Potassium: 3.9 mEq/L (ref 3.7–5.3)
Sodium: 138 mEq/L (ref 137–147)

## 2014-11-07 LAB — D-DIMER, QUANTITATIVE: D-Dimer, Quant: 0.44 ug/mL-FEU (ref 0.00–0.48)

## 2014-11-07 MED ORDER — NITROGLYCERIN 0.4 MG SL SUBL
0.4000 mg | SUBLINGUAL_TABLET | Freq: Once | SUBLINGUAL | Status: AC
  Administered 2014-11-07: 0.4 mg via SUBLINGUAL
  Filled 2014-11-07: qty 1

## 2014-11-07 MED ORDER — GI COCKTAIL ~~LOC~~
30.0000 mL | Freq: Once | ORAL | Status: AC
  Administered 2014-11-07: 30 mL via ORAL
  Filled 2014-11-07: qty 30

## 2014-11-07 MED ORDER — NITROGLYCERIN 0.4 MG SL SUBL: 0.4000 mg | SUBLINGUAL_TABLET | SUBLINGUAL | Status: DC | PRN

## 2014-11-07 NOTE — ED Notes (Signed)
Pt c/o chest pain this morning, states it initially was similar to heartburn and in the mid chest, now throbbing and around ribs bilaterally, states pain in bilateral upper and lower extremities, patient with nausea and vomiting.

## 2014-11-07 NOTE — Discharge Instructions (Signed)
Chest Wall Pain Chest wall pain is pain in or around the bones and muscles of your chest. It may take up to 6 weeks to get better. It may take longer if you must stay physically active in your work and activities.  CAUSES  Chest wall pain may happen on its own. However, it may be caused by:  A viral illness like the flu.  Injury.  Coughing.  Exercise.  Arthritis.  Fibromyalgia.  Shingles. HOME CARE INSTRUCTIONS   Avoid overtiring physical activity. Try not to strain or perform activities that cause pain. This includes any activities using your chest or your abdominal and side muscles, especially if heavy weights are used.  Put ice on the sore area.  Put ice in a plastic bag.  Place a towel between your skin and the bag.  Leave the ice on for 15-20 minutes per hour while awake for the first 2 days.  Only take over-the-counter or prescription medicines for pain, discomfort, or fever as directed by your caregiver. SEEK IMMEDIATE MEDICAL CARE IF:   Your pain increases, or you are very uncomfortable.  You have a fever.  Your chest pain becomes worse.  You have new, unexplained symptoms.  You have nausea or vomiting.  You feel sweaty or lightheaded.  You have a cough with phlegm (sputum), or you cough up blood. MAKE SURE YOU:   Understand these instructions.  Will watch your condition.  Will get help right away if you are not doing well or get worse. Document Released: 12/10/2005 Document Revised: 03/03/2012 Document Reviewed: 08/06/2011 [facility] Patient Information 2015 [place], [place]. This information is not intended to replace advice given to you by your health care provider. Make sure you discuss any questions you have with your health care provider.  Chest Pain (Nonspecific) It is often hard to give a specific diagnosis for the cause of chest pain. There is always a chance that your pain could be related to something serious, such as a heart attack or a blood  clot in the lungs. You need to follow up with your health care provider for further evaluation. CAUSES   Heartburn.  Pneumonia or bronchitis.  Anxiety or stress.  Inflammation around your heart (pericarditis) or lung (pleuritis or pleurisy).  A blood clot in the lung.  A collapsed lung (pneumothorax). It can develop suddenly on its own (spontaneous pneumothorax) or from trauma to the chest.  Shingles infection (herpes zoster virus). The chest wall is composed of bones, muscles, and cartilage. Any of these can be the source of the pain.  The bones can be bruised by injury.  The muscles or cartilage can be strained by coughing or overwork.  The cartilage can be affected by inflammation and become sore (costochondritis). DIAGNOSIS  Lab tests or other studies may be needed to find the cause of your pain. Your health care provider may have you take a test called an ambulatory electrocardiogram (ECG). An ECG records your heartbeat patterns over a 24-hour period. You may also have other tests, such as:  Transthoracic echocardiogram (TTE). During echocardiography, sound waves are used to evaluate how blood flows through your heart.  Transesophageal echocardiogram (TEE).  Cardiac monitoring. This allows your health care provider to monitor your heart rate and rhythm in real time.  Holter monitor. This is a portable device that records your heartbeat and can help diagnose heart arrhythmias. It allows your health care provider to track your heart activity for several days, if needed.  Stress tests by  exercise or by giving medicine that makes the heart beat faster. TREATMENT   Treatment depends on what may be causing your chest pain. Treatment may include:  Acid blockers for heartburn.  Anti-inflammatory medicine.  Pain medicine for inflammatory conditions.  Antibiotics if an infection is present.  You may be advised to change lifestyle habits. This includes stopping smoking and  avoiding alcohol, caffeine, and chocolate.  You may be advised to keep your head raised (elevated) when sleeping. This reduces the chance of acid going backward from your stomach into your esophagus. Most of the time, nonspecific chest pain will improve within 2-3 days with rest and mild pain medicine.  HOME CARE INSTRUCTIONS   If antibiotics were prescribed, take them as directed. Finish them even if you start to feel better.  For the next few days, avoid physical activities that bring on chest pain. Continue physical activities as directed.  Do not use any tobacco products, including cigarettes, chewing tobacco, or electronic cigarettes.  Avoid drinking alcohol.  Only take medicine as directed by your health care provider.  Follow your health care provider's suggestions for further testing if your chest pain does not go away.  Keep any follow-up appointments you made. If you do not go to an appointment, you could develop lasting (chronic) problems with pain. If there is any problem keeping an appointment, call to reschedule. SEEK MEDICAL CARE IF:   Your chest pain does not go away, even after treatment.  You have a rash with blisters on your chest.  You have a fever. SEEK IMMEDIATE MEDICAL CARE IF:   You have increased chest pain or pain that spreads to your arm, neck, jaw, back, or abdomen.  You have shortness of breath.  You have an increasing cough, or you cough up blood.  You have severe back or abdominal pain.  You feel nauseous or vomit.  You have severe weakness.  You faint.  You have chills. This is an emergency. Do not wait to see if the pain will go away. Get medical help at once. Call your local emergency services (911 in [place] Do not drive yourself to the hospital. MAKE SURE YOU:   Understand these instructions.  Will watch your condition.  Will get help right away if you are not doing well or get worse. Document Released: 09/19/2005 Document Revised:  12/15/2013 Document Reviewed: 07/15/2008 [facility] Patient Information 2015 [place], [place]. This information is not intended to replace advice given to you by your health care provider. Make sure you discuss any questions you have with your health care provider.  Varicose Veins Varicose veins are veins that have become enlarged and twisted. CAUSES This condition is the result of valves in the veins not working properly. Valves in the veins help return blood from the leg to the heart. If these valves are damaged, blood flows backwards and backs up into the veins in the leg near the skin. This causes the veins to become larger. People who are on their feet a lot, who are pregnant, or who are overweight are more likely to develop varicose veins. SYMPTOMS   Bulging, twisted-appearing, bluish veins, most commonly found on the legs.  Leg pain or a feeling of heaviness. These symptoms may be worse at the end of the day.  Leg swelling.  Skin color changes. DIAGNOSIS  Varicose veins can usually be diagnosed with an exam of your legs by your caregiver. He or she may recommend an ultrasound of your leg veins. TREATMENT  Most varicose veins can be treated at home.However, other treatments are available for people who have persistent symptoms or who want to treat the cosmetic appearance of the varicose veins. These include:  Laser treatment of very small varicose veins.  Medicine that is shot (injected) into the vein. This medicine hardens the walls of the vein and closes off the vein. This treatment is called sclerotherapy. Afterwards, you may need to wear clothing or bandages that apply pressure.  Surgery. HOME CARE INSTRUCTIONS   Do not stand or sit in one position for long periods of time. Do not sit with your legs crossed. Rest with your legs raised during the day.  Wear elastic stockings or support hose. Do not wear other tight, encircling garments around the legs, pelvis, or waist.  Walk  as much as possible to increase blood flow.  Raise the foot of your bed at night with 2-inch blocks.  If you get a cut in the skin over the vein and the vein bleeds, lie down with your leg raised and press on it with a clean cloth until the bleeding stops. Then place a bandage (dressing) on the cut. See your caregiver if it continues to bleed or needs stitches. SEEK MEDICAL CARE IF:   The skin around your ankle starts to break down.  You have pain, redness, tenderness, or hard swelling developing in your leg over a vein.  You are uncomfortable due to leg pain. Document Released: 09/19/2005 Document Revised: 03/03/2012 Document Reviewed: 02/05/2011 [facility] Patient Information 2015 [place], [place]. This information is not intended to replace advice given to you by your health care provider. Make sure you discuss any questions you have with your health care provider.

## 2014-11-07 NOTE — Progress Notes (Signed)
VASCULAR LAB PRELIMINARY  PRELIMINARY  PRELIMINARY  PRELIMINARY  Bilateral lower extremity venous Dopplers completed.    Preliminary report:  There is no DVT or SVT noted in the bilateral lower extremities.  Kaydense Rizo, RVT 11/07/2014, 6:46 PM

## 2014-11-07 NOTE — ED Notes (Addendum)
Explained d/c instructions and clinical findings in depth with pt however she refused to accept anything this writer told her.  Pt also asked for a printout of her lab work and I informed her of how to access mychart as well as the number to the help desk should she be unable to access it as she reports has been the situation in the past.  Pt very angry that i would not print out lab results for her, explained that it is against policy and gave pt the number to medical records.  Pt refusing to e-sign d/c, [place], [place] informed.  Per Bailey Mech, [place] she reports that she also went over clinical finding in depth w/ pt.  Dr. Regenia Skeeter went to pt's room to reiterate clinical finding however pt had already left leaving behind d/c instructions and number to medical records.

## 2014-11-07 NOTE — ED Provider Notes (Signed)
CSN: [id number]     Arrival date & time 11/07/14  1704 History   First MD Initiated Contact with Patient 11/07/14 1717     Chief Complaint  Patient presents with  . Chest Pain  . Emesis     (Consider location/radiation/quality/duration/timing/severity/associated sxs/prior Treatment) HPI Comments: Patient is a 38 year old female with a past medical history of chronic anemia, chronic pain, panic disorder, Crohn's disease, anxiety, diabetes, hypothyroidism, breast cancer in remission, hyperlipidemia and renal disorder (s/p renal transplant) who presents to the emergency department complaining of midsternal chest pain beginning around 4:00 AM today while she was reading. Initially patient thought she had heartburn, however states the pain remained throughout the day and worsened, no longer feeling like her heartburn. Currently states her chest is now throbbing, radiating down both of her arms. Admits to associated nausea with one episode of vomiting. Throughout the day today, she started to experience bilateral lower extremity pain, right greater than left with associated appearance of large veins which she has never had before. Denies history of blood clots. States she has a positive family history of heart disease before the age of 13 and her mother, father and grandmother. No recent long travel or surgeries. Nonsmoker.  Patient is a 38 y.o. female presenting with chest pain and vomiting. The history is provided by the patient.  Chest Pain Associated symptoms: vomiting   Emesis   Past Medical History  Diagnosis Date  . Chronic anemia   . Panic disorder   . Crohn's disease   . Anxiety     OCD  . 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 Fracture 05/01/2013    Right foot, in cast  . Hypothyroidism     parathroid runs low  . History of blood transfusion 2 years ago  . Cancer 2001    renal cell , breast cancer both breast 2011, cervical cancer 2013  . Complication of anesthesia     hard to  sedate dr hung aware  . PONV (postoperative nausea and vomiting)   . Renal disorder     S/p '11-Kidney transplant-Dr. Florene Glen follows  . Hyperlipidemia   . Chronic pain    Past Surgical History  Procedure Laterality Date  . Nephrectomy transplanted organ  1999 and 2011    last '11-[facility]  . Parathyroidectomy  02-20-2001  . Knee surgery Left april 2003    arthroscopy  . Cystoscopy  02/13/2012    Procedure: CYSTOSCOPY;  Surgeon: Franchot Gallo, MD;  Location: [facility];  Service: Urology;  Laterality: N/A;  insertion of ureteral catheter , removed per Dr Diona Fanti   . Tubal ligation  02-19-2000  . Abdominal hysterectomy  02/13/2012    Procedure: HYSTERECTOMY ABDOMINAL;  Surgeon: Cyril Mourning, MD;  Location: [facility];  Service: Gynecology;  Laterality: N/A;  . Breast lumpectomy and reconstruction Bilateral 2011  . Esophageal manometry N/A 08/10/2013    Procedure: ESOPHAGEAL MANOMETRY (EM);  Surgeon: Beryle Beams, MD;  Location: [facility];  Service: Endoscopy;  Laterality: N/A;  . Incision and drainage abscess N/A 08/26/2013    Procedure: INCISION AND DRAINAGE PERIRECTAL ABSCESS;  Surgeon: Jamesetta So, MD;  Location: [facility];  Service: General;  Laterality: N/A;  . Esophagogastroduodenoscopy N/A 09/25/2013    Procedure: ESOPHAGOGASTRODUODENOSCOPY (EGD);  Surgeon: Beryle Beams, MD;  Location: [facility];  Service: Endoscopy;  Laterality: N/A;  . Colonoscopy N/A 09/25/2013    Procedure: COLONOSCOPY;  Surgeon: Beryle Beams, MD;  Location: [facility];  Service:  Endoscopy;  Laterality: N/A;  . Groin dissection Left 10/09/2013    Procedure: INCISION AND DRAINAGE OF LEFT GROIN ABSCESS;  Surgeon: Shann Medal, MD;  Location: [facility];  Service: General;  Laterality: Left;  . Breast surgery      bilateral lumpectomy 4'11  . Esophagogastroduodenoscopy N/A 11/27/2013    Procedure: ESOPHAGOGASTRODUODENOSCOPY (EGD);  Surgeon: Beryle Beams, MD;  Location: [facility];  Service: Endoscopy;   Laterality: N/A;  . Bravo ph study N/A 11/27/2013    Procedure: BRAVO [place] STUDY;  Surgeon: Beryle Beams, MD;  Location: [facility];  Service: Endoscopy;  Laterality: N/A;  . Esophagogastroduodenoscopy N/A 12/11/2013    Procedure: ESOPHAGOGASTRODUODENOSCOPY (EGD);  Surgeon: Beryle Beams, MD;  Location: [facility];  Service: Endoscopy;  Laterality: N/A;  . Bravo ph study N/A 12/11/2013    Procedure: BRAVO [place] STUDY;  Surgeon: Beryle Beams, MD;  Location: [facility];  Service: Endoscopy;  Laterality: N/A;  . Lower extremity arterial dopllers  05/06/2012    Bilat ABIs were attempted-not accessible due to calcified veins. Bilat TBIs demonstrated no obtainable flow through both great toes. Bilat PVR ankle regions demonstrated moderately abnormal pulsatile flow. Lft SFA demonstrated a 50-69% diameter reduction. Lft runoff demonstrated occlusive diseasewith reconstitution of flow noted distally.  . Sleeve gastroplasty      for GERD, hiatal hernia   Family History  Problem Relation Age of Onset  . Diabetes Mother   . Hypertension Mother   . Diabetes Father   . Hypertension Father   . Cancer Father     Prostate cancer  . Obesity Sister   . Stroke Maternal Grandmother   . Heart attack Maternal Grandfather   . Polycystic ovary syndrome Sister    History  Substance Use Topics  . Smoking status: Never Smoker   . Smokeless tobacco: Never Used  . Alcohol Use: No   OB History    No data available     Review of Systems 10 Systems reviewed and are negative for acute change except as noted in the HPI.   Allergies  Barium-containing compounds; Dilaudid; Hydromorphone; Ivp dye; Shellfish allergy; Vancomycin; Adhesive; Infed; Sulfa antibiotics; and Iopamidol  Home Medications   Prior to Admission medications   Medication Sig Start Date End Date Taking? Authorizing Provider  atorvastatin (LIPITOR) 20 MG tablet Take 20 mg by mouth at bedtime.   Yes Historical Provider, MD  busPIRone  (BUSPAR) 15 MG tablet Take 15 mg by mouth 3 (three) times daily.  05/05/13  Yes Elmarie Shiley, NP  calcitRIOL (ROCALTROL) 0.25 MCG capsule Take 1.75 mcg by mouth every morning. Patient states that she takes 6 total 05/05/13  Yes Elmarie Shiley, NP  calcium carbonate (OS-CAL - DOSED IN MG OF ELEMENTAL CALCIUM) 1250 MG tablet Take 1 tablet by mouth 3 (three) times daily. 05/05/13  Yes Elmarie Shiley, NP  calcium carbonate (TUMS - DOSED IN MG ELEMENTAL CALCIUM) 500 MG chewable tablet Chew 1 tablet by mouth 3 (three) times daily.   Yes Historical Provider, MD  cetirizine (ZYRTEC) 10 MG tablet Take 10 mg by mouth every evening. 05/05/13  Yes Elmarie Shiley, NP  cilostazol (PLETAL) 50 MG tablet Take 1 tablet (50 mg total) by mouth 2 (two) times daily. 02/24/14  Yes Lorretta Harp, MD  Cyanocobalamin (VITAMIN B-12 SL) Place 1 each under the tongue daily.   Yes Historical Provider, MD  cyclobenzaprine (FLEXERIL) 10 MG tablet Take 1 tablet (10 mg total) by mouth 3 (three) times daily  as needed for muscle spasms. 10/05/13  Yes Susy Frizzle, MD  fluocinonide cream (LIDEX) [id number] % Apply 1 application topically daily as needed (flare up as needed).   Yes Historical Provider, MD  FLUoxetine (PROZAC) 20 MG capsule Take 60 mg by mouth every morning.   Yes Historical Provider, MD  hydrOXYzine (ATARAX/VISTARIL) 25 MG tablet Take 25 mg by mouth at bedtime.    Yes Historical Provider, MD  metoCLOPramide (REGLAN) 5 MG tablet Take 5 mg by mouth 3 (three) times daily. Frequency unspecified 05/05/13  Yes Elmarie Shiley, NP  mycophenolate (MYFORTIC) 180 MG EC tablet Take 540 mg by mouth 2 (two) times daily. 05/05/13  Yes Elmarie Shiley, NP  omeprazole (PRILOSEC) 40 MG capsule Take 40 mg by mouth 2 (two) times daily.    Yes Historical Provider, MD  ondansetron (ZOFRAN) 8 MG tablet Take 8 mg by mouth 2 (two) times daily as needed for nausea or vomiting.   Yes Historical Provider, MD  Oxycodone HCl 10 MG TABS Take 1 tablet (10 mg total) by mouth 3  (three) times daily as needed (pain). For pain 10/04/14  Yes Susy Frizzle, MD  predniSONE (DELTASONE) 10 MG tablet Take 10 mg by mouth daily with breakfast.    Yes Historical Provider, MD  promethazine (PHENERGAN) 50 MG tablet Take 50 mg by mouth every 6 (six) hours as needed for nausea or vomiting.   Yes Historical Provider, MD  SUMAtriptan (IMITREX) 50 MG tablet May repeat in 2 hours if headache persists or recurs x 1 dose 01/26/14  Yes Susy Frizzle, MD  tacrolimus (PROGRAF) 1 MG capsule Take 3 mg by mouth 2 (two) times daily.   Yes Historical Provider, MD  tiotropium (SPIRIVA) 18 MCG inhalation capsule Place 18 mcg into inhaler and inhale daily.   Yes Historical Provider, MD  insulin aspart (NOVOLOG FLEXPEN) 100 UNIT/ML SOPN FlexPen Inject 15 Units into the skin 3 (three) times daily with meals.     Historical Provider, MD  LINZESS 145 MCG CAPS capsule TAKE ONE CAPSULE BY MOUTH EVERY DAY.    Susy Frizzle, MD   BP 105/64 mmHg  Pulse 58  Temp(Src) 98.1 F (36.7 C) (Oral)  Resp 18  SpO2 99%  LMP 01/30/2012 Physical Exam  Constitutional: She is oriented to person, place, and time. She appears well-developed and well-nourished. No distress.  HENT:  Head: Normocephalic and atraumatic.  Mouth/Throat: Oropharynx is clear and moist.  Eyes: Conjunctivae and EOM are normal. Pupils are equal, round, and reactive to light.  Neck: Normal range of motion. Neck supple. No JVD present.  Cardiovascular: Normal rate, regular rhythm, normal heart sounds and intact distal pulses.   No extremity edema. Varicosities noted right lower extremity. Tender. Tenderness to right calf.  Pulmonary/Chest: Effort normal and breath sounds normal. No respiratory distress.  Abdominal: Soft. Bowel sounds are normal. There is no tenderness.  Musculoskeletal: Normal range of motion. She exhibits no edema.  Neurological: She is alert and oriented to person, place, and time. She has normal strength. No sensory  deficit.  Speech fluent, goal oriented. Moves limbs without ataxia. Equal grip strength bilateral.  Skin: Skin is warm and dry. She is not diaphoretic.  Psychiatric: She has a normal mood and affect. Her behavior is normal.  Nursing note and vitals reviewed.   ED Course  Procedures (including critical care time)  Expand All Collapse All   VASCULAR LAB PRELIMINARY PRELIMINARY PRELIMINARY PRELIMINARY  Bilateral lower extremity venous Dopplers completed.  Preliminary report: There is no DVT or SVT noted in the bilateral lower extremities.  KANADY, CANDACE, RVT 11/07/2014, 6:46 PM       Labs Review Labs Reviewed  CBC - Abnormal; Notable for the following:    WBC 3.3 (*)    RDW 15.7 (*)    All other components within normal limits  BASIC METABOLIC PANEL - Abnormal; Notable for the following:    Glucose, Bld 178 (*)    Creatinine, Ser 1.33 (*)    Calcium 7.6 (*)    GFR calc non Af Amer 49 (*)    GFR calc Af Amer 57 (*)    All other components within normal limits  D-DIMER, QUANTITATIVE  I-STAT TROPOININ, ED    Imaging Review Dg Chest 2 View  11/07/2014   CLINICAL DATA:  Chest pain this morning. Nausea and vomiting. Initial encounter.  EXAM: CHEST  2 VIEW  COMPARISON:  09/11/2013; 04/13/2013  FINDINGS: Grossly unchanged cardiac silhouette and mediastinal contours with atherosclerotic plaque within the thoracic aorta. Improved aeration of the lungs with persistent minimal bilateral infrahilar heterogeneous opacities favored to represent atelectasis. No new focal airspace opacities. No pleural effusion or pneumothorax. No evidence of edema. No acute osseus abnormalities. Surgical clips overlie the right thoracic inlet and upper abdomen bilaterally.  IMPRESSION: No acute cardiopulmonary disease.   Electronically Signed   By: Sandi Mariscal M.D.   On: 11/07/2014 20:19     EKG Interpretation   Date/Time:  Sunday November 07 2014 17:44:43 EST Ventricular Rate:  74 PR  Interval:  184 QRS Duration: 84 QT Interval:  450 QTC Calculation: 499 R Axis:   25 Text Interpretation:  Sinus rhythm Low voltage, extremity and precordial  leads Consider anterior infarct No significant change since last tracing  Confirmed by GOLDSTON  MD, SCOTT ([id number]) on 11/07/2014 9:28:46 PM      MDM   Final diagnoses:  Chest pain  Chest wall pain  Varicose veins   Pt presenting with multiple complaints. She is in NAD. AFVSS. CP reproducible. Given LE edema and calf tenderness, along with CP, cannot r/o PE, however low risk. D-dimer WNL. No hypoxia or tachycardia. Doubt PE. LE venous duplex negative for DVT. Cardiac workup negative. Doubt cardiac. HEART score 3. No relief with SL nitro or GI cocktail. Most likely MSS chest pain given tenderness. She is stable for d/c. Return precautions given. At d/c, pt upset that "nothing was done for her" and that she could not be given a copy of her blood work. She was advised to call medical records. F/u with PCP.  Discussed with attending Dr. Regenia Skeeter who agrees with plan of care.  HEART score 3.  Carman Ching, PA-C 11/08/14 0021  Ephraim Hamburger, MD 11/15/14 [id number]

## 2014-11-08 ENCOUNTER — Telehealth: Payer: Self-pay | Admitting: Physician Assistant

## 2014-11-08 ENCOUNTER — Telehealth: Payer: Self-pay | Admitting: Cardiovascular Disease

## 2014-11-08 MED ORDER — OXYCODONE HCL 10 MG PO TABS: 10.0000 mg | ORAL_TABLET | Freq: Three times a day (TID) | ORAL | Status: DC | PRN

## 2014-11-08 NOTE — Telephone Encounter (Signed)
Left message for pt to call.

## 2014-11-08 NOTE — Telephone Encounter (Signed)
Pt states varicose veins popping out on leg yesterday.  Has never had problem before.  THey suggested she see Dr. Gwenlyn Found.  States things are worse - legs swelling and very painful as are her arms.   States she is dizzy and sob.

## 2014-11-08 NOTE — Telephone Encounter (Signed)
RX printed, left up front and patient aware to pick up  

## 2014-11-08 NOTE — Telephone Encounter (Signed)
Patient is calling to get rx on hydrocodone  [phone] please call when ready

## 2014-11-08 NOTE — Telephone Encounter (Signed)
Refill approved.

## 2014-11-10 ENCOUNTER — Other Ambulatory Visit: Payer: Self-pay | Admitting: Family Medicine

## 2014-11-10 NOTE — Telephone Encounter (Signed)
Please call pt and see why she is requesting this, has not had in 1 year,  If she is on Medicare they likley will not cover and may need to be put on Zanaflex

## 2014-11-10 NOTE — Telephone Encounter (Signed)
Ok to refill??  Last office visit 08/17/2014.  Last refill 10/05/2013.

## 2014-11-11 NOTE — Telephone Encounter (Signed)
Spoke to pt and she only takes it as needed and her last rx had refills on it. She is not sure what insurance covers but she just pays for it out of pocket and i did inform her that it was on [facility]'s 4 dollar list. Ok to refill?

## 2014-11-12 NOTE — Telephone Encounter (Signed)
Okay to refill? 

## 2014-11-15 ENCOUNTER — Telehealth: Payer: Self-pay | Admitting: *Deleted

## 2014-11-15 NOTE — Telephone Encounter (Signed)
Received call from [facility] stating needing [employer] referral on this pt. Submitted referral thru [facility] for Dr. Erling Cruz, authorization number is [id number].

## 2014-12-09 ENCOUNTER — Telehealth: Payer: Self-pay | Admitting: Family Medicine

## 2014-12-09 MED ORDER — OXYCODONE HCL 10 MG PO TABS: 10.0000 mg | ORAL_TABLET | Freq: Three times a day (TID) | ORAL | Status: DC | PRN

## 2014-12-09 NOTE — Telephone Encounter (Signed)
Patient needs rx written for her oxycodone  [phone] when ready

## 2014-12-09 NOTE — Telephone Encounter (Signed)
ok 

## 2014-12-09 NOTE — Telephone Encounter (Signed)
RX printed, left up front and patient aware to pick up via vm 

## 2014-12-09 NOTE — Telephone Encounter (Signed)
?   OK to Refill  

## 2014-12-22 ENCOUNTER — Encounter: Payer: Self-pay | Admitting: Physician Assistant

## 2014-12-22 ENCOUNTER — Ambulatory Visit (INDEPENDENT_AMBULATORY_CARE_PROVIDER_SITE_OTHER): Payer: Medicare HMO | Admitting: Physician Assistant

## 2014-12-22 VITALS — BP 108/60 | HR 76 | Temp 98.3°F | Resp 18 | Wt 179.0 lb

## 2014-12-22 DIAGNOSIS — J029 Acute pharyngitis, unspecified: Secondary | ICD-10-CM

## 2014-12-22 LAB — RAPID STREP SCREEN (MED CTR MEBANE ONLY): Streptococcus, Group A Screen (Direct): NEGATIVE

## 2014-12-22 NOTE — Progress Notes (Signed)
Patient ID: HARVETTA SOHMER MRN: [id number], DOB: 01-29-1976, 38 y.o. Date of Encounter: 12/22/2014, 12:03 PM    Chief Complaint:  Chief Complaint  Patient presents with  . sick x 2 days    sore throat, ear ache. sore in mouth     HPI: 38 y.o. year old AA female says that she has been sick just a couple of days. Says that she's had minimal runny nose or nasal congestion a couple days ago but now really isn't having any. Had minimal cough and says that's mostly been just because of scratchy throat. No chest congestion or deep cough. Has had some low-grade fever. Around 99. I asked about ear ache-- she points to the area from her left throat to her left ear and says that has been achy.     Home Meds:   Outpatient Prescriptions Prior to Visit  Medication Sig Dispense Refill  . atorvastatin (LIPITOR) 20 MG tablet Take 20 mg by mouth at bedtime.    . calcitRIOL (ROCALTROL) 0.25 MCG capsule Take 1.75 mcg by mouth every morning. Patient states that she takes 6 total    . calcium carbonate (OS-CAL - DOSED IN MG OF ELEMENTAL CALCIUM) 1250 MG tablet Take 1 tablet by mouth 3 (three) times daily.    . calcium carbonate (TUMS - DOSED IN MG ELEMENTAL CALCIUM) 500 MG chewable tablet Chew 1 tablet by mouth 3 (three) times daily.    . cetirizine (ZYRTEC) 10 MG tablet Take 10 mg by mouth every evening.    . Cyanocobalamin (VITAMIN B-12 SL) Place 1 each under the tongue daily.    . cyclobenzaprine (FLEXERIL) 10 MG tablet TAKE ONE TABLET BY MOUTH THREE TIMES DAILY AS NEEDED FOR MUSCLE SPASMS. 90 tablet 1  . fluocinonide cream (LIDEX) [id number] % Apply 1 application topically daily as needed (flare up as needed).    Marland Kitchen FLUoxetine (PROZAC) 20 MG capsule Take 60 mg by mouth every morning.    . hydrOXYzine (ATARAX/VISTARIL) 25 MG tablet Take 25 mg by mouth at bedtime.     Marland Kitchen LINZESS 145 MCG CAPS capsule TAKE ONE CAPSULE BY MOUTH EVERY DAY. 30 capsule 5  . metoCLOPramide (REGLAN) 5 MG tablet Take 5 mg by mouth  3 (three) times daily. Frequency unspecified    . mycophenolate (MYFORTIC) 180 MG EC tablet Take 540 mg by mouth 2 (two) times daily.    Marland Kitchen omeprazole (PRILOSEC) 40 MG capsule Take 40 mg by mouth 2 (two) times daily.     . ondansetron (ZOFRAN) 8 MG tablet Take 8 mg by mouth 2 (two) times daily as needed for nausea or vomiting.    . Oxycodone HCl 10 MG TABS Take 1 tablet (10 mg total) by mouth 3 (three) times daily as needed (pain). For pain 90 tablet 0  . predniSONE (DELTASONE) 10 MG tablet Take 10 mg by mouth daily with breakfast.     . promethazine (PHENERGAN) 50 MG tablet Take 50 mg by mouth every 6 (six) hours as needed for nausea or vomiting.    . tacrolimus (PROGRAF) 1 MG capsule Take 3 mg by mouth 2 (two) times daily.    Marland Kitchen tiotropium (SPIRIVA) 18 MCG inhalation capsule Place 18 mcg into inhaler and inhale daily.    . busPIRone (BUSPAR) 15 MG tablet Take 15 mg by mouth 3 (three) times daily.     . cilostazol (PLETAL) 50 MG tablet Take 1 tablet (50 mg total) by mouth 2 (two) times daily. (Patient not  taking: Reported on 12/22/2014) 30 tablet 0  . insulin aspart (NOVOLOG FLEXPEN) 100 UNIT/ML SOPN FlexPen Inject 15 Units into the skin 3 (three) times daily with meals.     . SUMAtriptan (IMITREX) 50 MG tablet May repeat in 2 hours if headache persists or recurs x 1 dose (Patient not taking: Reported on 12/22/2014) 10 tablet 0   No facility-administered medications prior to visit.    Allergies:  Allergies  Allergen Reactions  . Barium-Containing Compounds Shortness Of Breath  . Dilaudid [Hydromorphone Hcl] Anaphylaxis  . Hydromorphone Shortness Of Breath  . Ivp Dye [Iodinated Diagnostic Agents] Anaphylaxis  . Shellfish Allergy Shortness Of Breath and Itching    Causes red, patchy areas to form  . Vancomycin Anaphylaxis  . Adhesive [Tape] Other (See Comments)    Dermatitis   . Infed [Iron Dextran] Itching  . Sulfa Antibiotics Hives  . Iopamidol Itching and Rash      Review of  Systems: See HPI for pertinent ROS. All other ROS negative.    Physical Exam: Blood pressure 108/60, pulse 76, temperature 98.3 F (36.8 C), temperature source Oral, resp. rate 18, weight 179 lb (81.194 kg), last menstrual period 01/30/2012., Body mass index is 31.72 kg/(m^2). General: WNWD AAF.  Appears in no acute distress. HEENT: Normocephalic, atraumatic, eyes without discharge, sclera non-icteric, nares are without discharge. Bilateral auditory canals clear, TM's are without perforation, pearly grey and translucent with reflective cone of light bilaterally. Oral cavity moist, posterior pharynx without exudate or peritonsillar abscess. There is mild erythema.  Neck: Supple. No thyromegaly. She reports tenderness with palpation of left cervical lymph nodes but I feel no enlarged nodes. No tenderness or enlargement of the right lymph nodes. Lungs: Clear bilaterally to auscultation without wheezes, rales, or rhonchi. Breathing is unlabored. Heart: Regular rhythm. No murmurs, rubs, or gallops. Msk:  Strength and tone normal for age. Extremities/Skin: Warm and dry.  No rashes. Neuro: Alert and oriented X 3. Moves all extremities spontaneously. Gait is normal. CNII-XII grossly in tact. Psych:  Responds to questions appropriately with a normal affect.   Results for orders placed or performed in visit on 12/22/14  Rapid strep screen  Result Value Ref Range   Source THROAT    Streptococcus, Group A Screen (Direct) NEG NEGATIVE     ASSESSMENT AND PLAN:  38 y.o. year old female with   1. Viral pharyngitis Can use over-the-counter lozenges or spray to ease the pain of the sore throat. Follow-up if fever increases or if symptoms worsen or are not resolving in one week.  2. Sorethroat - Rapid strep screen---Negative   Signed, [address] [place], [place], [facility] 12/22/2014 12:03 PM

## 2015-01-18 ENCOUNTER — Telehealth: Payer: Self-pay | Admitting: Family Medicine

## 2015-01-18 MED ORDER — OXYCODONE HCL 10 MG PO TABS: 10.0000 mg | ORAL_TABLET | Freq: Three times a day (TID) | ORAL | Status: DC | PRN

## 2015-01-18 NOTE — Telephone Encounter (Signed)
ok 

## 2015-01-18 NOTE — Telephone Encounter (Signed)
[phone] PT is needing a refill on Oxycodone HCl 10 MG TABS

## 2015-01-18 NOTE — Telephone Encounter (Signed)
RX printed, left up front and patient aware to pick up  

## 2015-01-18 NOTE — Telephone Encounter (Signed)
?   OK to Refill  

## 2015-01-25 ENCOUNTER — Telehealth: Payer: Self-pay | Admitting: Family Medicine

## 2015-01-25 ENCOUNTER — Emergency Department (HOSPITAL_COMMUNITY)
Admission: EM | Admit: 2015-01-25 | Discharge: 2015-01-25 | Disposition: A | Discharge status: Home / Self Care | Payer: Medicare HMO | Attending: Emergency Medicine | Admitting: Emergency Medicine

## 2015-01-25 ENCOUNTER — Encounter (HOSPITAL_COMMUNITY): Payer: Self-pay

## 2015-01-25 DIAGNOSIS — Z794 Long term (current) use of insulin: Secondary | ICD-10-CM | POA: Insufficient documentation

## 2015-01-25 DIAGNOSIS — Z8719 Personal history of other diseases of the digestive system: Secondary | ICD-10-CM | POA: Insufficient documentation

## 2015-01-25 DIAGNOSIS — Z87448 Personal history of other diseases of urinary system: Secondary | ICD-10-CM | POA: Diagnosis not present

## 2015-01-25 DIAGNOSIS — Z853 Personal history of malignant neoplasm of breast: Secondary | ICD-10-CM | POA: Diagnosis not present

## 2015-01-25 DIAGNOSIS — Z79899 Other long term (current) drug therapy: Secondary | ICD-10-CM | POA: Diagnosis not present

## 2015-01-25 DIAGNOSIS — G8929 Other chronic pain: Secondary | ICD-10-CM | POA: Insufficient documentation

## 2015-01-25 DIAGNOSIS — Z8781 Personal history of (healed) traumatic fracture: Secondary | ICD-10-CM | POA: Diagnosis not present

## 2015-01-25 DIAGNOSIS — D649 Anemia, unspecified: Secondary | ICD-10-CM | POA: Diagnosis not present

## 2015-01-25 DIAGNOSIS — Z85528 Personal history of other malignant neoplasm of kidney: Secondary | ICD-10-CM | POA: Insufficient documentation

## 2015-01-25 DIAGNOSIS — F419 Anxiety disorder, unspecified: Secondary | ICD-10-CM | POA: Insufficient documentation

## 2015-01-25 DIAGNOSIS — E119 Type 2 diabetes mellitus without complications: Secondary | ICD-10-CM | POA: Diagnosis not present

## 2015-01-25 DIAGNOSIS — E785 Hyperlipidemia, unspecified: Secondary | ICD-10-CM | POA: Insufficient documentation

## 2015-01-25 DIAGNOSIS — N750 Cyst of Bartholin's gland: Secondary | ICD-10-CM | POA: Diagnosis not present

## 2015-01-25 LAB — CBC
HCT: 37.8 % (ref 36.0–46.0)
Hemoglobin: 11.8 g/dL — ABNORMAL LOW (ref 12.0–15.0)
MCH: 27.4 pg (ref 26.0–34.0)
MCHC: 31.2 g/dL (ref 30.0–36.0)
MCV: 87.9 fL (ref 78.0–100.0)
Platelets: 289 10*3/uL (ref 150–400)
RBC: 4.3 MIL/uL (ref 3.87–5.11)
RDW: 15.2 % (ref 11.5–15.5)
WBC: 8.1 10*3/uL (ref 4.0–10.5)

## 2015-01-25 LAB — COMPREHENSIVE METABOLIC PANEL
ALT: 8 U/L (ref 0–35)
AST: 14 U/L (ref 0–37)
Albumin: 4.1 g/dL (ref 3.5–5.2)
Alkaline Phosphatase: 75 U/L (ref 39–117)
Anion gap: 9 (ref 5–15)
BUN: 18 mg/dL (ref 6–23)
CO2: 24 mmol/L (ref 19–32)
Calcium: 8.1 mg/dL — ABNORMAL LOW (ref 8.4–10.5)
Chloride: 106 mmol/L (ref 96–112)
Creatinine, Ser: 1.39 mg/dL — ABNORMAL HIGH (ref 0.50–1.10)
GFR calc Af Amer: 54 mL/min — ABNORMAL LOW (ref >90)
GFR calc non Af Amer: 47 mL/min — ABNORMAL LOW (ref >90)
Glucose, Bld: 94 mg/dL (ref 70–99)
Potassium: 3.6 mmol/L (ref 3.5–5.1)
Sodium: 139 mmol/L (ref 135–145)
Total Bilirubin: 0.7 mg/dL (ref 0.3–1.2)
Total Protein: 7.7 g/dL (ref 6.0–8.3)

## 2015-01-25 MED ORDER — OXYCODONE-ACETAMINOPHEN 5-325 MG PO TABS
2.0000 | ORAL_TABLET | Freq: Once | ORAL | Status: AC
  Administered 2015-01-25: 2 via ORAL
  Filled 2015-01-25: qty 2

## 2015-01-25 MED ORDER — LIDOCAINE-EPINEPHRINE-TETRACAINE (LET) TOPICAL GEL
3.0000 mL | Freq: Once | TOPICAL | Status: DC
Start: 2015-01-25 — End: 2015-01-25

## 2015-01-25 MED ORDER — LIDOCAINE HCL 2 % IJ SOLN
10.0000 mL | Freq: Once | INTRAMUSCULAR | Status: DC
  Filled 2015-01-25: qty 20

## 2015-01-25 MED ORDER — LIDOCAINE-EPINEPHRINE-TETRACAINE (LET) SOLUTION
3.0000 mL | Freq: Once | NASAL | Status: AC
  Administered 2015-01-25: 3 mL via TOPICAL
  Filled 2015-01-25: qty 3

## 2015-01-25 MED ORDER — OXYCODONE-ACETAMINOPHEN 5-325 MG PO TABS: 1.0000 | ORAL_TABLET | Freq: Four times a day (QID) | ORAL | Status: DC | PRN

## 2015-01-25 MED ORDER — LORAZEPAM 1 MG PO TABS
1.0000 mg | ORAL_TABLET | Freq: Once | ORAL | Status: AC
  Administered 2015-01-25: 1 mg via ORAL
  Filled 2015-01-25: qty 1

## 2015-01-25 NOTE — ED Notes (Signed)
Pt has bartholin cyst.  Has been treating since last Tuesday.  Getting bigger and worse.  Noted in triage low bp.  Pt states this is normal for her.

## 2015-01-25 NOTE — Discharge Instructions (Signed)
Bartholin's Cyst or Abscess °Bartholin's glands are small glands located within the folds of skin (labia) along the sides of the lower opening of the vagina (birth canal). A cyst may develop when the duct of the gland becomes blocked. When this happens, fluid that accumulates within the cyst can become infected. This is known as an abscess. The Bartholin gland produces a mucous fluid to lubricate the outside of the vagina during sexual intercourse. °SYMPTOMS  °· Patients with a small cyst may not have any symptoms. °· Mild discomfort to severe pain depending on the size of the cyst and if it is infected (abscess). °· Pain, redness, and swelling around the lower opening of the vagina. °· Painful intercourse. °· Pressure in the perineal area. °· Swelling of the lips of the vagina (labia). °· The cyst or abscess can be on one side or both sides of the vagina. °DIAGNOSIS  °· A large swelling is seen in the lower vagina area by your caregiver. °· Painful to touch. °· Redness and pain, if it is an abscess. °TREATMENT  °· Sometimes the cyst will go away on its own. °· Apply warm wet compresses to the area or take hot sitz baths several times a day. °· An incision to drain the cyst or abscess with local anesthesia. °· Culture the pus, if it is an abscess. °· Antibiotic treatment, if it is an abscess. °· Cut open the gland and suture the edges to make the opening of the gland bigger (marsupialization). °· Remove the whole gland if the cyst or abscess returns. °PREVENTION  °· Practice good hygiene. °· Clean the vaginal area with a mild soap and soft cloth when bathing. °· Do not rub hard in the vaginal area when bathing. °· Protect the crotch area with a padded cushion if you take long bike rides or ride horses. °· Be sure you are well lubricated when you have sexual intercourse. °HOME CARE INSTRUCTIONS  °· If your cyst or abscess was opened, a small piece of gauze, or a drain, may have been placed in the wound to allow  drainage. Do not remove this gauze or drain unless directed by your caregiver. °· Wear feminine pads, not tampons, as needed for any drainage or bleeding. °· If antibiotics were prescribed, take them exactly as directed. Finish the entire course. °· Only take over-the-counter or prescription medicines for pain, discomfort, or fever as directed by your caregiver. °SEEK IMMEDIATE MEDICAL CARE IF:  °· You have an increase in pain, redness, swelling, or drainage. °· You have bleeding from the wound which results in the use of more than the number of pads suggested by your caregiver in 24 hours. °· You have chills. °· You have a fever. °· You develop any new problems (symptoms) or aggravation of your existing condition. °MAKE SURE YOU:  °· Understand these instructions. °· Will watch your condition. °· Will get help right away if you are not doing well or get worse. °Document Released: 12/10/2005 Document Revised: 03/03/2012 Document Reviewed: 07/28/2008 °ExitCare® Patient Information ©2015 [employer]. This information is not intended to replace advice given to you by your health care provider. Make sure you discuss any questions you have with your health care provider. ° °

## 2015-01-25 NOTE — Telephone Encounter (Signed)
I agree 100%

## 2015-01-25 NOTE — Telephone Encounter (Signed)
Pt seen in ED today. Given Rx for Percocet 5/325 #10.  Pt already on Oxycodone 10 mg TID prn pain. (just had this refilled on 1/28)  Pharmacist does not want to fill and wants OK from [place] to do so.  Pt does not need this in addition to her already prescribed pain medication.  She can purchase OTC Acetaminophen if she wishes to add that to her Oxycodone.  Pt does not need to take 15 mg of Oxycodone.

## 2015-01-25 NOTE — ED Notes (Signed)
MD at bedside. 

## 2015-01-25 NOTE — ED Notes (Signed)
Bed: WA07 Expected date:  Expected time:  Means of arrival:  Comments: 

## 2015-01-25 NOTE — ED Provider Notes (Signed)
CSN: [id number]     Arrival date & time 01/25/15  0811 History   First MD Initiated Contact with Patient 01/25/15 [phone]     Chief Complaint  Patient presents with  . Bartholin's Cyst     (Consider location/radiation/quality/duration/timing/severity/associated sxs/prior Treatment) HPI Comments: Saw her GYN yesterday who wanted to pursue I&D of her Bartholin's cyst, but patient refused. Stated fever this morning and severe pain.  Patient is a 40 y.o. female presenting with female genitourinary complaint. The history is provided by the patient.  Female GU Problem This is a recurrent problem. The current episode started more than 1 week ago. The problem occurs constantly. The problem has been gradually worsening. Pertinent negatives include no shortness of breath. Nothing aggravates the symptoms. Nothing relieves the symptoms.    Past Medical History  Diagnosis Date  . Chronic anemia   . Panic disorder   . Crohn's disease   . Anxiety     OCD  . 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 Fracture 05/01/2013    Right foot, in cast  . Hypothyroidism     parathroid runs low  . History of blood transfusion 2 years ago  . Cancer 2001    renal cell , breast cancer both breast 2011, cervical cancer 2013  . Complication of anesthesia     hard to sedate dr hung aware  . PONV (postoperative nausea and vomiting)   . Renal disorder     S/p '11-Kidney transplant-Dr. Florene Glen follows  . Hyperlipidemia   . Chronic pain    Past Surgical History  Procedure Laterality Date  . Nephrectomy transplanted organ  1999 and 2011    last '11-[facility]  . Parathyroidectomy  02-20-2001  . Knee surgery Left april 2003    arthroscopy  . Cystoscopy  02/13/2012    Procedure: CYSTOSCOPY;  Surgeon: Franchot Gallo, MD;  Location: [facility];  Service: Urology;  Laterality: N/A;  insertion of ureteral catheter , removed per Dr Diona Fanti   . Tubal ligation  02-19-2000  . Abdominal hysterectomy  02/13/2012    Procedure:  HYSTERECTOMY ABDOMINAL;  Surgeon: Cyril Mourning, MD;  Location: [facility];  Service: Gynecology;  Laterality: N/A;  . Breast lumpectomy and reconstruction Bilateral 2011  . Esophageal manometry N/A 08/10/2013    Procedure: ESOPHAGEAL MANOMETRY (EM);  Surgeon: Beryle Beams, MD;  Location: [facility];  Service: Endoscopy;  Laterality: N/A;  . Incision and drainage abscess N/A 08/26/2013    Procedure: INCISION AND DRAINAGE PERIRECTAL ABSCESS;  Surgeon: Jamesetta So, MD;  Location: [facility];  Service: General;  Laterality: N/A;  . Esophagogastroduodenoscopy N/A 09/25/2013    Procedure: ESOPHAGOGASTRODUODENOSCOPY (EGD);  Surgeon: Beryle Beams, MD;  Location: [facility];  Service: Endoscopy;  Laterality: N/A;  . Colonoscopy N/A 09/25/2013    Procedure: COLONOSCOPY;  Surgeon: Beryle Beams, MD;  Location: [facility];  Service: Endoscopy;  Laterality: N/A;  . Groin dissection Left 10/09/2013    Procedure: INCISION AND DRAINAGE OF LEFT GROIN ABSCESS;  Surgeon: Shann Medal, MD;  Location: [facility];  Service: General;  Laterality: Left;  . Breast surgery      bilateral lumpectomy 4'11  . Esophagogastroduodenoscopy N/A 11/27/2013    Procedure: ESOPHAGOGASTRODUODENOSCOPY (EGD);  Surgeon: Beryle Beams, MD;  Location: [facility];  Service: Endoscopy;  Laterality: N/A;  . Bravo ph study N/A 11/27/2013    Procedure: BRAVO [place] STUDY;  Surgeon: Beryle Beams, MD;  Location: [facility];  Service: Endoscopy;  Laterality: N/A;  . Esophagogastroduodenoscopy N/A 12/11/2013    Procedure: ESOPHAGOGASTRODUODENOSCOPY (EGD);  Surgeon: Beryle Beams, MD;  Location: [facility];  Service: Endoscopy;  Laterality: N/A;  . Bravo ph study N/A 12/11/2013    Procedure: BRAVO [place] STUDY;  Surgeon: Beryle Beams, MD;  Location: [facility];  Service: Endoscopy;  Laterality: N/A;  . Lower extremity arterial dopllers  05/06/2012    Bilat ABIs were attempted-not accessible due to calcified veins. Bilat TBIs demonstrated  no obtainable flow through both great toes. Bilat PVR ankle regions demonstrated moderately abnormal pulsatile flow. Lft SFA demonstrated a 50-69% diameter reduction. Lft runoff demonstrated occlusive diseasewith reconstitution of flow noted distally.  . Sleeve gastroplasty      for GERD, hiatal hernia   Family History  Problem Relation Age of Onset  . Diabetes Mother   . Hypertension Mother   . Diabetes Father   . Hypertension Father   . Cancer Father     Prostate cancer  . Obesity Sister   . Stroke Maternal Grandmother   . Heart attack Maternal Grandfather   . Polycystic ovary syndrome Sister    History  Substance Use Topics  . Smoking status: Never Smoker   . Smokeless tobacco: Never Used  . Alcohol Use: No   OB History    No data available     Review of Systems  Constitutional: Positive for fever (101 this morning).  Respiratory: Negative for cough and shortness of breath.   Gastrointestinal: Negative for vomiting.  All other systems reviewed and are negative.     Allergies  Barium-containing compounds; Dilaudid; Hydromorphone; Ivp dye; Shellfish allergy; Vancomycin; Adhesive; Infed; Sulfa antibiotics; and Iopamidol  Home Medications   Prior to Admission medications   Medication Sig Start Date End Date Taking? Authorizing Provider  atorvastatin (LIPITOR) 20 MG tablet Take 20 mg by mouth at bedtime.    Historical Provider, MD  busPIRone (BUSPAR) 15 MG tablet Take 15 mg by mouth 3 (three) times daily.  05/05/13   Elmarie Shiley, NP  calcitRIOL (ROCALTROL) 0.25 MCG capsule Take 1.75 mcg by mouth every morning. Patient states that she takes 6 total 05/05/13   Elmarie Shiley, NP  calcium carbonate (OS-CAL - DOSED IN MG OF ELEMENTAL CALCIUM) 1250 MG tablet Take 1 tablet by mouth 3 (three) times daily. 05/05/13   Elmarie Shiley, NP  calcium carbonate (TUMS - DOSED IN MG ELEMENTAL CALCIUM) 500 MG chewable tablet Chew 1 tablet by mouth 3 (three) times daily.    Historical Provider, MD    cetirizine (ZYRTEC) 10 MG tablet Take 10 mg by mouth every evening. 05/05/13   Elmarie Shiley, NP  cilostazol (PLETAL) 50 MG tablet Take 1 tablet (50 mg total) by mouth 2 (two) times daily. Patient not taking: Reported on 12/22/2014 02/24/14   Lorretta Harp, MD  Cyanocobalamin (VITAMIN B-12 SL) Place 1 each under the tongue daily.    Historical Provider, MD  cyclobenzaprine (FLEXERIL) 10 MG tablet TAKE ONE TABLET BY MOUTH THREE TIMES DAILY AS NEEDED FOR MUSCLE SPASMS. 11/12/14   Alycia Rossetti, MD  fluocinonide cream (LIDEX) [id number] % Apply 1 application topically daily as needed (flare up as needed).    Historical Provider, MD  FLUoxetine (PROZAC) 20 MG capsule Take 60 mg by mouth every morning.    Historical Provider, MD  hydrOXYzine (ATARAX/VISTARIL) 25 MG tablet Take 25 mg by mouth at bedtime.     Historical Provider, MD  insulin aspart (NOVOLOG FLEXPEN) 100 UNIT/ML SOPN FlexPen  Inject 15 Units into the skin 3 (three) times daily with meals.     Historical Provider, MD  LINZESS 145 MCG CAPS capsule TAKE ONE CAPSULE BY MOUTH EVERY DAY.    Susy Frizzle, MD  metoCLOPramide (REGLAN) 5 MG tablet Take 5 mg by mouth 3 (three) times daily. Frequency unspecified 05/05/13   Elmarie Shiley, NP  mycophenolate (MYFORTIC) 180 MG EC tablet Take 540 mg by mouth 2 (two) times daily. 05/05/13   Elmarie Shiley, NP  omeprazole (PRILOSEC) 40 MG capsule Take 40 mg by mouth 2 (two) times daily.     Historical Provider, MD  ondansetron (ZOFRAN) 8 MG tablet Take 8 mg by mouth 2 (two) times daily as needed for nausea or vomiting.    Historical Provider, MD  Oxycodone HCl 10 MG TABS Take 1 tablet (10 mg total) by mouth 3 (three) times daily as needed (pain). For pain 01/18/15   Susy Frizzle, MD  predniSONE (DELTASONE) 10 MG tablet Take 10 mg by mouth daily with breakfast.     Historical Provider, MD  promethazine (PHENERGAN) 50 MG tablet Take 50 mg by mouth every 6 (six) hours as needed for nausea or vomiting.    Historical  Provider, MD  SUMAtriptan (IMITREX) 50 MG tablet May repeat in 2 hours if headache persists or recurs x 1 dose Patient not taking: Reported on 12/22/2014 01/26/14   Susy Frizzle, MD  tacrolimus (PROGRAF) 1 MG capsule Take 3 mg by mouth 2 (two) times daily.    Historical Provider, MD  tiotropium (SPIRIVA) 18 MCG inhalation capsule Place 18 mcg into inhaler and inhale daily.    Historical Provider, MD   BP 87/66 mmHg  Pulse 85  Temp(Src) 98.4 F (36.9 C) (Oral)  Resp 18  SpO2 100%  LMP 01/30/2012 Physical Exam  Constitutional: She is oriented to person, place, and time. She appears well-developed and well-nourished. No distress.  HENT:  Head: Normocephalic and atraumatic.  Mouth/Throat: Oropharynx is clear and moist.  Eyes: EOM are normal. Pupils are equal, round, and reactive to light.  Neck: Normal range of motion. Neck supple.  Cardiovascular: Normal rate and regular rhythm.  Exam reveals no friction rub.   No murmur heard. Pulmonary/Chest: Effort normal and breath sounds normal. No respiratory distress. She has no wheezes. She has no rales.  Abdominal: Soft. She exhibits no distension. There is no tenderness. There is no rebound.  Genitourinary:     Musculoskeletal: Normal range of motion. She exhibits no edema.  Lymphadenopathy:       Left: Inguinal (tender) adenopathy present.  Neurological: She is alert and oriented to person, place, and time. No cranial nerve deficit. She exhibits normal muscle tone. Coordination normal.  Skin: Skin is warm. No rash noted. She is not diaphoretic.  Nursing note and vitals reviewed.   ED Course  INCISION AND DRAINAGE Date/Time: 01/25/2015 1:54 PM Performed by: Evelina Bucy Authorized by: Evelina Bucy Consent: Verbal consent obtained. Type: abscess Body area: anogenital Location details: Bartholin's gland Anesthesia: local infiltration Local anesthetic: lidocaine 1% with epinephrine Anesthetic total: 5 ml Scalpel size:  11 Incision type: single straight Complexity: simple Drainage: purulent and  bloody Drainage amount: copious Wound treatment: wound left open (unable to place drain) Patient tolerance: Patient tolerated the procedure well with no immediate complications   (including critical care time) Labs Review Labs Reviewed - No data to display  Imaging Review No results found.   EKG Interpretation None      MDM  Final diagnoses:  Bartholin's cyst    26F with hx of renal transplant presents with vaginal pain. She has L sided bartholin's cyst. Had a fever this morning. Patient here with L sided bartholin's. Also has tender L inguinal lymphadenopathy. She is amenable to I&D here, given pain and anxiety meds. I&D performed as above. Unable to place Word catheter. Stable for discharge.   Evelina Bucy, MD 01/25/15 1355

## 2015-01-28 ENCOUNTER — Telehealth: Payer: Self-pay | Admitting: *Deleted

## 2015-01-28 NOTE — Telephone Encounter (Signed)
Submitted [employer] referral thru [facility] connect for authorization to [facility] Dr. Delila Pereyra, MD with authorization number [id number]   Dx:N75.0-Cyst of Bartholin's Gland  Number of visits: 6  Start Date: 01/24/15 end date: 07/715  Hard copy sent to [facility] for records

## 2015-02-14 ENCOUNTER — Telehealth: Payer: Self-pay | Admitting: Family Medicine

## 2015-02-14 NOTE — Telephone Encounter (Signed)
[phone] Pt is needing a refill on oxyCODONE-acetaminophen (PERCOCET) 5-325 MG per tablet

## 2015-02-14 NOTE — Telephone Encounter (Signed)
ok 

## 2015-02-14 NOTE — Telephone Encounter (Signed)
Pt is not due until 02/23/15 will do refill at that time

## 2015-02-14 NOTE — Telephone Encounter (Signed)
?   OK to Refill  

## 2015-02-16 ENCOUNTER — Telehealth: Payer: Self-pay | Admitting: *Deleted

## 2015-02-16 MED ORDER — OXYCODONE HCL 10 MG PO TABS: 10.0000 mg | ORAL_TABLET | Freq: Three times a day (TID) | ORAL | Status: DC | PRN

## 2015-02-16 NOTE — Telephone Encounter (Signed)
Pt called to see if prescribtion was ready, informed pt that she had received prescription for oxycodone 5-325mg  from hospital but never had gottten it filled, she is calling to get refill on oxycodone 10mg  and was refilled on 01/18/15. Please advise!

## 2015-02-16 NOTE — Telephone Encounter (Signed)
Pt was needing her Oxycodone 10 mg not percocet - see phone note for 02/16/15 - rx was printed and pt aware of pu

## 2015-02-16 NOTE — Telephone Encounter (Signed)
Per [facility] ok to do - RX printed, left up front and patient aware to pick up

## 2015-03-14 ENCOUNTER — Other Ambulatory Visit: Payer: Self-pay | Admitting: Family Medicine

## 2015-03-14 NOTE — Telephone Encounter (Signed)
[place] with refill due for ov.

## 2015-03-14 NOTE — Telephone Encounter (Signed)
Patient calling to get refill on oxycodone  [phone]

## 2015-03-14 NOTE — Telephone Encounter (Signed)
?   OK to Refill  

## 2015-03-15 MED ORDER — OXYCODONE HCL 10 MG PO TABS: 10.0000 mg | ORAL_TABLET | Freq: Three times a day (TID) | ORAL | Status: DC | PRN

## 2015-03-15 NOTE — Telephone Encounter (Signed)
Script printed and ready for provider signature

## 2015-04-15 ENCOUNTER — Telehealth: Payer: Self-pay | Admitting: Family Medicine

## 2015-04-15 NOTE — Telephone Encounter (Signed)
Patient is calling to get refill on her oxycodone  [phone]

## 2015-04-18 NOTE — Telephone Encounter (Signed)
?   OK to Refill  

## 2015-04-18 NOTE — Telephone Encounter (Signed)
Patient has not been seen in 8 months denied

## 2015-04-18 NOTE — Telephone Encounter (Signed)
Pt made an appt for this Friday - can we give her a month rx? I did inform her that i would ask but if she does not keep this appt then she would not get anymore until appt.

## 2015-04-19 MED ORDER — OXYCODONE HCL 10 MG PO TABS: 10.0000 mg | ORAL_TABLET | Freq: Three times a day (TID) | ORAL | Status: DC | PRN

## 2015-04-19 NOTE — Telephone Encounter (Signed)
Ok with refill 

## 2015-04-19 NOTE — Telephone Encounter (Signed)
RX printed, left up front and patient aware to pick up via vm 

## 2015-04-22 ENCOUNTER — Ambulatory Visit (INDEPENDENT_AMBULATORY_CARE_PROVIDER_SITE_OTHER): Payer: Commercial Managed Care - HMO | Admitting: Family Medicine

## 2015-04-22 ENCOUNTER — Encounter: Payer: Self-pay | Admitting: Family Medicine

## 2015-04-22 VITALS — BP 98/54 | HR 64 | Temp 98.2°F | Resp 14 | Ht 63.0 in | Wt 175.0 lb

## 2015-04-22 DIAGNOSIS — G8929 Other chronic pain: Secondary | ICD-10-CM

## 2015-04-22 DIAGNOSIS — M79672 Pain in left foot: Secondary | ICD-10-CM | POA: Diagnosis not present

## 2015-04-22 NOTE — Progress Notes (Signed)
Subjective:    Patient ID: [name], female    DOB: Apr 12, 1976, 39 y.o.   MRN: [id number]  HPI Since I last saw the patient in August, the patient has lost almost 100 pounds. She is essentially cured her diabetes. She's been off insulin now for several months and her most recent hemoglobin A1c checked at [facility] was 6.2. Her LDL cholesterol was 128 but they did reduce her Lipitor to 20 mg a day. Her mammogram was just performed in April and was normal. Her Pap smear was also performed in April. She is not due for colonoscopy. Pneumovax 23 and Prevnar up-to-date. I asked the patient to come in for drug monitoring. She is currently on oxycodone. She takes this twice a day for pain in her feet. The primary pain is severe pain in her left foot particularly the great toe. She also has neuropathic pain in the left foot. In the right foot she has pain in the midfoot.  She also has chronic low back pain. There's been no evidence of abuse or diversion. She states that she is using the medication now twice a day therefore 60 tablet should last a month. She consents to urine drug screen. Past Medical History  Diagnosis Date  . Chronic anemia   . Panic disorder   . Crohn's disease   . Anxiety     OCD  . 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 Fracture 05/01/2013    Right foot, in cast  . Hypothyroidism     parathroid runs low  . History of blood transfusion 2 years ago  . Cancer 2001    renal cell , breast cancer both breast 2011, cervical cancer 2013  . Complication of anesthesia     hard to sedate dr hung aware  . PONV (postoperative nausea and vomiting)   . Renal disorder     S/p '11-Kidney transplant-Dr. Florene Glen follows  . Hyperlipidemia   . Chronic pain    Past Surgical History  Procedure Laterality Date  . Nephrectomy transplanted organ  1999 and 2011    last '11-[facility]  . Parathyroidectomy  02-20-2001  . Knee surgery Left april 2003    arthroscopy  . Cystoscopy  02/13/2012   Procedure: CYSTOSCOPY;  Surgeon: Franchot Gallo, MD;  Location: [facility];  Service: Urology;  Laterality: N/A;  insertion of ureteral catheter , removed per Dr Diona Fanti   . Tubal ligation  02-19-2000  . Abdominal hysterectomy  02/13/2012    Procedure: HYSTERECTOMY ABDOMINAL;  Surgeon: Cyril Mourning, MD;  Location: [facility];  Service: Gynecology;  Laterality: N/A;  . Breast lumpectomy and reconstruction Bilateral 2011  . Esophageal manometry N/A 08/10/2013    Procedure: ESOPHAGEAL MANOMETRY (EM);  Surgeon: Beryle Beams, MD;  [facility]: [facility];  Service: Endoscopy;  Laterality: N/A;  . Incision and drainage abscess N/A 08/26/2013    Procedure: INCISION AND DRAINAGE PERIRECTAL ABSCESS;  Surgeon: Jamesetta So, MD;  Location: [facility];  Service: General;  Laterality: N/A;  . Esophagogastroduodenoscopy N/A 09/25/2013    Procedure: ESOPHAGOGASTRODUODENOSCOPY (EGD);  Surgeon: Beryle Beams, MD;  Location: [facility];  Service: Endoscopy;  Laterality: N/A;  . Colonoscopy N/A 09/25/2013    Procedure: COLONOSCOPY;  Surgeon: Beryle Beams, MD;  [facility]: [facility];  Service: Endoscopy;  Laterality: N/A;  . Groin dissection Left 10/09/2013    Procedure: INCISION AND DRAINAGE OF LEFT GROIN ABSCESS;  Surgeon: Shann Medal, MD;  Location: [facility];  Service:  General;  Laterality: Left;  . Breast surgery      bilateral lumpectomy 4'11  . Esophagogastroduodenoscopy N/A 11/27/2013    Procedure: ESOPHAGOGASTRODUODENOSCOPY (EGD);  Surgeon: Beryle Beams, MD;  Location: [facility];  Service: Endoscopy;  Laterality: N/A;  . Bravo ph study N/A 11/27/2013    Procedure: BRAVO [place] STUDY;  Surgeon: Beryle Beams, MD;  [facility];  Service: Endoscopy;  Laterality: N/A;  . Esophagogastroduodenoscopy N/A 12/11/2013    Procedure: ESOPHAGOGASTRODUODENOSCOPY (EGD);  Surgeon: Beryle Beams, MD;  Location: [facility];  Service: Endoscopy;  Laterality: N/A;  . Bravo ph study N/A 12/11/2013     Procedure: BRAVO [place] STUDY;  Surgeon: Beryle Beams, MD;  [facility];  Service: Endoscopy;  Laterality: N/A;  . Lower extremity arterial dopllers  05/06/2012    Bilat ABIs were attempted-not accessible due to calcified veins. Bilat TBIs demonstrated no obtainable flow through both great toes. Bilat PVR ankle regions demonstrated moderately abnormal pulsatile flow. Lft SFA demonstrated a 50-69% diameter reduction. Lft runoff demonstrated occlusive diseasewith reconstitution of flow noted distally.  . Sleeve gastroplasty      for GERD, hiatal hernia   Current Outpatient Prescriptions on File Prior to Visit  Medication Sig Dispense Refill  . atorvastatin (LIPITOR) 20 MG tablet Take 20 mg by mouth at bedtime.    . calcitRIOL (ROCALTROL) 0.25 MCG capsule Take 1.75 mcg by mouth every morning. Patient states that she takes 6 total    . calcium carbonate (OS-CAL - DOSED IN MG OF ELEMENTAL CALCIUM) 1250 MG tablet Take 1 tablet by mouth 3 (three) times daily.    . cetirizine (ZYRTEC) 10 MG tablet Take 10 mg by mouth every evening.    . cyclobenzaprine (FLEXERIL) 10 MG tablet TAKE ONE TABLET BY MOUTH THREE TIMES DAILY AS NEEDED FOR MUSCLE SPASMS. 90 tablet 1  . fluocinonide cream (LIDEX) [id number] % Apply 1 application topically daily as needed (flare up as needed).    . mycophenolate (MYFORTIC) 180 MG EC tablet Take 540 mg by mouth 2 (two) times daily.    Marland Kitchen omeprazole (PRILOSEC) 40 MG capsule Take 40 mg by mouth 2 (two) times daily.     . ondansetron (ZOFRAN) 8 MG tablet Take 8 mg by mouth 2 (two) times daily as needed for nausea or vomiting.    . Oxycodone HCl 10 MG TABS Take 1 tablet (10 mg total) by mouth 3 (three) times daily as needed (pain). For pain 90 tablet 0  . predniSONE (DELTASONE) 10 MG tablet Take 10 mg by mouth daily with breakfast.     . promethazine (PHENERGAN) 50 MG tablet Take 50 mg by mouth every 6 (six) hours as needed for nausea or vomiting.    . tacrolimus (PROGRAF) 1 MG  capsule Take 3 mg by mouth 2 (two) times daily.     No current facility-administered medications on file prior to visit.   Allergies  Allergen Reactions  . Barium-Containing Compounds Shortness Of Breath  . Dilaudid [Hydromorphone Hcl] Anaphylaxis  . Hydromorphone Shortness Of Breath  . Ivp Dye [Iodinated Diagnostic Agents] Anaphylaxis  . Shellfish Allergy Shortness Of Breath and Itching    Causes red, patchy areas to form  . Vancomycin Anaphylaxis  . Adhesive [Tape] Other (See Comments)    Dermatitis   . Infed [Iron Dextran] Itching  . Sulfa Antibiotics Hives  . Iopamidol Itching and Rash   History   Social History  . Marital Status: Single    Spouse  Name: N/A  . Number of Children: N/A  . Years of Education: N/A   Occupational History  . Not on file.   Social History Main Topics  . Smoking status: Never Smoker   . Smokeless tobacco: Never Used  . Alcohol Use: No  . Drug Use: No  . Sexual Activity: Not Currently   Other Topics Concern  . Not on file   Social History Narrative      Review of Systems  All other systems reviewed and are negative.      Objective:   Physical Exam  Constitutional: She appears well-developed and well-nourished.  Cardiovascular: Normal rate, regular rhythm and normal heart sounds.   Pulmonary/Chest: Effort normal and breath sounds normal. No respiratory distress. She has no wheezes. She has no rales.  Abdominal: Soft. Bowel sounds are normal. She exhibits no distension. There is no tenderness. There is no rebound.  Vitals reviewed.         Assessment & Plan:  Chronic foot pain, left - Plan: Drug Screen, Urine  I will continue oxycodone but I will decrease to 10 mg twice daily as this is what the patient reports that she is taking. I will also check a urine drug screen. This is to confirm compliance and rule out abuse of illicit substances. The remainder of her preventative care is also been performed at [facility] under  the care of her specialist. However her blood pressure is well controlled. Her diabetes is now essentially cured. Her cholesterol is excellent. Her liver test and renal function have been recently monitored. Her creatinine was 1.3 and her liver function tests were normal.

## 2015-04-23 LAB — DRUG SCREEN, URINE
Amphetamine Screen, Ur: NEGATIVE
Barbiturate Quant, Ur: NEGATIVE
Benzodiazepines.: NEGATIVE
Cocaine Metabolites: NEGATIVE
Creatinine,U: 138.42 mg/dL
Marijuana Metabolite: NEGATIVE
Methadone: NEGATIVE
Opiates: NEGATIVE
Phencyclidine (PCP): NEGATIVE
Propoxyphene: NEGATIVE

## 2015-05-04 ENCOUNTER — Encounter: Payer: Self-pay | Admitting: Family Medicine

## 2015-06-15 ENCOUNTER — Other Ambulatory Visit: Payer: Self-pay | Admitting: Family Medicine

## 2015-06-15 NOTE — Telephone Encounter (Signed)
LRF 11/12/14 #90 + 1.  LOV 04/22/15  OK refill?

## 2015-06-16 ENCOUNTER — Other Ambulatory Visit: Payer: Self-pay | Admitting: Family Medicine

## 2015-06-16 NOTE — Telephone Encounter (Signed)
Medication refilled per protocol. 

## 2015-06-16 NOTE — Telephone Encounter (Signed)
ok 

## 2015-06-20 ENCOUNTER — Other Ambulatory Visit: Payer: Self-pay | Admitting: Gastroenterology

## 2015-07-05 NOTE — Patient Outreach (Signed)
[facility]) Care Management  07/05/2015  BRYANAH NURMI Jan 09, 1976 [id number]   Referral from [facility] Tier 4 list, assigned to [name], RN for patient outreach.  Jadien [facility] Care Management Assistant [phone]

## 2015-07-22 ENCOUNTER — Encounter (HOSPITAL_COMMUNITY): Payer: Self-pay | Admitting: *Deleted

## 2015-07-26 ENCOUNTER — Other Ambulatory Visit: Payer: Self-pay | Admitting: *Deleted

## 2015-07-26 NOTE — Patient Outreach (Signed)
[facility]) Care Management  07/26/2015  [name] 07/16/1976 [id number]   [place] Tier 4 referral:  Telephone call to patient; left message on voice mail requesting call back.  Plan: will follow up. [name], RN BSN [place] Management Coordinator [facility] Care Management  [phone]

## 2015-07-28 ENCOUNTER — Encounter: Payer: Self-pay | Admitting: *Deleted

## 2015-07-28 ENCOUNTER — Other Ambulatory Visit: Payer: Self-pay | Admitting: *Deleted

## 2015-07-28 NOTE — H&P (Signed)
[name] HPI: She is there for her routine post-transplant surveillance colonoscopy.  No interval changes.  Past Medical History  Diagnosis Date  . Chronic anemia   . Panic disorder   . Crohn's disease   . Anxiety     OCD  . 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 Fracture 05/01/2013    Right foot, in cast  . Hypothyroidism     parathroid runs low  . History of blood transfusion 2 years ago  . Cancer 2001    renal cell , breast cancer both breast 2011, cervical cancer 2013  . Complication of anesthesia     hard to sedate dr Dimas Scheck aware  . PONV (postoperative nausea and vomiting)   . Renal disorder     S/p '11-Kidney transplant-Dr. Florene Glen follows  . Hyperlipidemia   . Chronic pain     Past Surgical History  Procedure Laterality Date  . Nephrectomy transplanted organ  1999 and 2011    last '11-[facility]  . Parathyroidectomy  02-20-2001  . Knee surgery Left april 2003    arthroscopy  . Cystoscopy  02/13/2012    Procedure: CYSTOSCOPY;  Surgeon: Franchot Gallo, MD;  Location: [facility];  Service: Urology;  Laterality: N/A;  insertion of ureteral catheter , removed per Dr Diona Fanti   . Tubal ligation  02-19-2000  . Abdominal hysterectomy  02/13/2012    Procedure: HYSTERECTOMY ABDOMINAL;  Surgeon: Cyril Mourning, MD;  Location: [facility];  Service: Gynecology;  Laterality: N/A;  . Breast lumpectomy and reconstruction Bilateral 2011  . Esophageal manometry N/A 08/10/2013    Procedure: ESOPHAGEAL MANOMETRY (EM);  Surgeon: Beryle Beams, MD;  [facility];  Service: Endoscopy;  Laterality: N/A;  . Incision and drainage abscess N/A 08/26/2013    Procedure: INCISION AND DRAINAGE PERIRECTAL ABSCESS;  Surgeon: Jamesetta So, MD;  Location: [facility];  Service: General;  Laterality: N/A;  . Esophagogastroduodenoscopy N/A 09/25/2013    Procedure: ESOPHAGOGASTRODUODENOSCOPY (EGD);  Surgeon: Beryle Beams, MD;  Location: [facility];  Service: Endoscopy;  Laterality: N/A;  .  Colonoscopy N/A 09/25/2013    Procedure: COLONOSCOPY;  Surgeon: Beryle Beams, MD;  [facility]: [facility];  Service: Endoscopy;  Laterality: N/A;  . Groin dissection Left 10/09/2013    Procedure: INCISION AND DRAINAGE OF LEFT GROIN ABSCESS;  Surgeon: Shann Medal, MD;  Location: [facility];  Service: General;  Laterality: Left;  . Breast surgery      bilateral lumpectomy 4'11  . Esophagogastroduodenoscopy N/A 11/27/2013    Procedure: ESOPHAGOGASTRODUODENOSCOPY (EGD);  Surgeon: Beryle Beams, MD;  Location: [facility];  Service: Endoscopy;  Laterality: N/A;  . Bravo ph study N/A 11/27/2013    Procedure: BRAVO [place] STUDY;  Surgeon: Beryle Beams, MD;  [facility]: [facility];  Service: Endoscopy;  Laterality: N/A;  . Esophagogastroduodenoscopy N/A 12/11/2013    Procedure: ESOPHAGOGASTRODUODENOSCOPY (EGD);  Surgeon: Beryle Beams, MD;  Location: [facility];  Service: Endoscopy;  Laterality: N/A;  . Bravo ph study N/A 12/11/2013    Procedure: BRAVO [place] STUDY;  Surgeon: Beryle Beams, MD;  [facility];  Service: Endoscopy;  Laterality: N/A;  . Lower extremity arterial dopllers  05/06/2012    Bilat ABIs were attempted-not accessible due to calcified veins. Bilat TBIs demonstrated no obtainable flow through both great toes. Bilat PVR ankle regions demonstrated moderately abnormal pulsatile flow. Lft SFA demonstrated a 50-69% diameter reduction. Lft runoff demonstrated occlusive diseasewith reconstitution of flow noted distally.  Marland Kitchen  Sleeve gastroplasty      for GERD, hiatal hernia    Family History  Problem Relation Age of Onset  . Diabetes Mother   . Hypertension Mother   . Diabetes Father   . Hypertension Father   . Cancer Father     Prostate cancer  . Obesity Sister   . Stroke Maternal Grandmother   . Heart attack Maternal Grandfather   . Polycystic ovary syndrome Sister     Social History:  reports that she has never smoked. She has never used smokeless tobacco. She reports  that she does not drink alcohol or use illicit drugs.  Allergies:  Allergies  Allergen Reactions  . Barium-Containing Compounds Shortness Of Breath  . Dilaudid [Hydromorphone Hcl] Anaphylaxis  . Hydromorphone Shortness Of Breath  . Ivp Dye [Iodinated Diagnostic Agents] Anaphylaxis  . Shellfish Allergy Shortness Of Breath and Itching    Causes red, patchy areas to form  . Vancomycin Anaphylaxis  . Adhesive [Tape] Other (See Comments)    Dermatitis   . Infed [Iron Dextran] Itching  . Sulfa Antibiotics Hives  . Iopamidol Itching and Rash    Medications: Scheduled: Continuous:  No results found for this or any previous visit (from the past 24 hour(s)).   No results found.  ROS:  As stated above in the HPI otherwise negative.  Last menstrual period 01/30/2012.    PE: Gen: NAD, Alert and Oriented HEENT:  [place]/AT, EOMI Neck: Supple, no LAD Lungs: CTA Bilaterally CV: RRR without M/G/R ABM: Soft, NTND, +BS Ext: No C/C/E  Assessment/Plan: 1) Screening colonoscopy.  Emonte Dieujuste D 07/28/2015, 12:17 PM

## 2015-07-28 NOTE — Patient Outreach (Signed)
  [facility]) Care Management  07/28/2015  [name] Apr 20, 1976 [id number]  Telephone call to patient who was advised of reason for call & [facility] care management services. Patient voices that she is satisfied with the management of her health currently and does not need case management services. States she does not wish to be called in the future regarding case management services.  Plan: Will close case; MD closure letter sent.  [name], RN BSN [place] Management Coordinator [facility] Care Management  [phone]

## 2015-07-29 ENCOUNTER — Ambulatory Visit (HOSPITAL_COMMUNITY)
Admission: RE | Admit: 2015-07-29 | Discharge: 2015-07-29 | Disposition: A | Discharge status: Home / Self Care | Payer: BLUE CROSS/BLUE SHIELD | Source: Ambulatory Visit | Attending: Gastroenterology | Admitting: Gastroenterology

## 2015-07-29 ENCOUNTER — Ambulatory Visit (HOSPITAL_COMMUNITY): Payer: BLUE CROSS/BLUE SHIELD | Admitting: Certified Registered Nurse Anesthetist

## 2015-07-29 ENCOUNTER — Encounter (HOSPITAL_COMMUNITY): Payer: Self-pay

## 2015-07-29 ENCOUNTER — Encounter (HOSPITAL_COMMUNITY)
Admission: RE | Disposition: A | Discharge status: Home / Self Care | Payer: Self-pay | Source: Ambulatory Visit | Attending: Gastroenterology

## 2015-07-29 DIAGNOSIS — I129 Hypertensive chronic kidney disease with stage 1 through stage 4 chronic kidney disease, or unspecified chronic kidney disease: Secondary | ICD-10-CM | POA: Diagnosis not present

## 2015-07-29 DIAGNOSIS — Z1211 Encounter for screening for malignant neoplasm of colon: Secondary | ICD-10-CM | POA: Diagnosis not present

## 2015-07-29 DIAGNOSIS — Z940 Kidney transplant status: Secondary | ICD-10-CM | POA: Diagnosis not present

## 2015-07-29 DIAGNOSIS — K5090 Crohn's disease, unspecified, without complications: Secondary | ICD-10-CM | POA: Insufficient documentation

## 2015-07-29 DIAGNOSIS — N183 Chronic kidney disease, stage 3 (moderate): Secondary | ICD-10-CM | POA: Diagnosis not present

## 2015-07-29 DIAGNOSIS — Z7952 Long term (current) use of systemic steroids: Secondary | ICD-10-CM | POA: Insufficient documentation

## 2015-07-29 DIAGNOSIS — E119 Type 2 diabetes mellitus without complications: Secondary | ICD-10-CM | POA: Diagnosis not present

## 2015-07-29 HISTORY — PX: COLONOSCOPY WITH PROPOFOL: SHX5780

## 2015-07-29 SURGERY — COLONOSCOPY WITH PROPOFOL
Anesthesia: Monitor Anesthesia Care

## 2015-07-29 MED ORDER — SODIUM CHLORIDE 0.9 % IV SOLN
INTRAVENOUS | Status: DC
Start: 2015-07-29 — End: 2015-07-29

## 2015-07-29 MED ORDER — LIDOCAINE HCL (CARDIAC) 20 MG/ML IV SOLN
INTRAVENOUS | Status: DC | PRN
Start: 2015-07-29 — End: 2015-07-29
  Administered 2015-07-29: 50 mg via INTRAVENOUS

## 2015-07-29 MED ORDER — MIDAZOLAM HCL 2 MG/2ML IJ SOLN
INTRAMUSCULAR | Status: AC
  Filled 2015-07-29: qty 4

## 2015-07-29 MED ORDER — PROPOFOL 10 MG/ML IV BOLUS
INTRAVENOUS | Status: DC | PRN
  Administered 2015-07-29 (×6): 75 mg via INTRAVENOUS
  Administered 2015-07-29: 50 mg via INTRAVENOUS

## 2015-07-29 MED ORDER — PROPOFOL 10 MG/ML IV BOLUS
INTRAVENOUS | Status: AC
  Filled 2015-07-29: qty 20

## 2015-07-29 MED ORDER — LIDOCAINE HCL (CARDIAC) 20 MG/ML IV SOLN
INTRAVENOUS | Status: AC
  Filled 2015-07-29: qty 5

## 2015-07-29 MED ORDER — LACTATED RINGERS IV SOLN
INTRAVENOUS | Status: DC | PRN
  Administered 2015-07-29: 08:00:00 via INTRAVENOUS

## 2015-07-29 SURGICAL SUPPLY — 22 items

## 2015-07-29 NOTE — Anesthesia Preprocedure Evaluation (Addendum)
Anesthesia Evaluation  Patient identified by MRN, date of birth, ID band Patient awake    Reviewed: Allergy & Precautions, H&P , NPO status , Patient's Chart, lab work & pertinent test results  History of Anesthesia Complications (+) PONV and history of anesthetic complications  Airway Mallampati: III  TM Distance: >3 FB Neck ROM: full    Dental no notable dental hx. (+) Teeth Intact, Dental Advisory Given   Pulmonary sleep apnea ,  breath sounds clear to auscultation  Pulmonary exam normal       Cardiovascular Exercise Tolerance: Good hypertension, Pt. on medications Normal cardiovascular examRhythm:regular Rate:Normal     Neuro/Psych Anxiety Depression negative neurological ROS     GI/Hepatic Neg liver ROS, Crohns disease   Endo/Other  diabetes, Well Controlled, Type 2, Insulin DependentHypothyroidism   Renal/GU Renal diseaseKidney transplant with stage 3 kidney disease  negative genitourinary   Musculoskeletal   Abdominal (+) + obese,   Peds  Hematology negative hematology ROS (+)   Anesthesia Other Findings Chronic steroid use  NPO appropriate, allergies reviewed Denies active cardiac or pulmonary symptoms, METS > 4 No recent congestive cough or symptoms of upper respiratory infection Meds - prednisone last taken yesterday, tacrolimus   Reproductive/Obstetrics negative OB ROS                           Anesthesia Physical  Anesthesia Plan  ASA: III  Anesthesia Plan: MAC   Post-op Pain Management:    Induction:   Airway Management Planned: Natural Airway  Additional Equipment:   Intra-op Plan:   Post-operative Plan:   Informed Consent: I have reviewed the patients History and Physical, chart, labs and discussed the procedure including the risks, benefits and alternatives for the proposed anesthesia with the patient or authorized representative who has indicated his/her  understanding and acceptance.     Plan Discussed with: CRNA, Surgeon and Anesthesiologist  Anesthesia Plan Comments:         Anesthesia Quick Evaluation

## 2015-07-29 NOTE — Discharge Instructions (Signed)

## 2015-07-29 NOTE — Transfer of Care (Signed)
Immediate Anesthesia Transfer of Care Note  Patient: [name]  Procedure(s) Performed: Procedure(s): COLONOSCOPY WITH PROPOFOL (N/A)  Patient Location: PACU  Anesthesia Type:MAC  Level of Consciousness: awake, alert  and oriented  Airway & Oxygen Therapy: Patient Spontanous Breathing and Patient connected to face mask oxygen  Post-op Assessment: Report given to RN and Post -op Vital signs reviewed and stable  Post vital signs: Reviewed and stable  Last Vitals:  Filed Vitals:   07/29/15 0740  BP: 100/58  Pulse: 65  Temp: 36.6 C  Resp: 14    Complications: No apparent anesthesia complications

## 2015-07-29 NOTE — Anesthesia Postprocedure Evaluation (Signed)
  Anesthesia Post-op Note  Patient: [name]  Procedure(s) Performed: Procedure(s) (LRB): COLONOSCOPY WITH PROPOFOL (N/A)  Patient Location: PACU  Anesthesia Type: MAC  Level of Consciousness: awake and alert   Airway and Oxygen Therapy: Patient Spontanous Breathing  Post-op Pain: mild  Post-op Assessment: Post-op Vital signs reviewed, Patient's Cardiovascular Status Stable, Respiratory Function Stable, Patent Airway and No signs of Nausea or vomiting  Last Vitals:  Filed Vitals:   07/29/15 0740  BP: 100/58  Pulse: 65  Temp: 36.6 C  Resp: 14    Post-op Vital Signs: stable   Complications: No apparent anesthesia complications

## 2015-07-29 NOTE — Op Note (Signed)
[facility] [place], [address]   COLONOSCOPY PROCEDURE REPORT  PATIENT: [name], [name]  MR#: [id number] BIRTHDATE: 01/31/1976 , 39  yrs. old GENDER: female ENDOSCOPIST: Carol Ada, MD REFERRED BY: PROCEDURE DATE:  2015-08-18 PROCEDURE:   Colonoscopy, diagnostic ASA CLASS:   Class III INDICATIONS: Screening MEDICATIONS: Monitored anesthesia care  DESCRIPTION OF PROCEDURE:   After the risks and benefits and of the procedure were explained, informed consent was obtained.  revealed no abnormalities of the rectum.    The Pentax Ped Colon [id number] endoscope was introduced through the anus and advanced to the cecum, which was identified by both the appendix and ileocecal valve .  The quality of the prep was good. .  The instrument was then slowly withdrawn as the colon was fully examined. Estimated blood loss is zero unless otherwise noted in this procedure report.    FINDINGS: A normal appearing cecum, ileocecal valve, and appendiceal orifice were identified.  The ascending, transverse, descending, sigmoid colon, and rectum appeared unremarkable for any polyps, masses, or inflammation.  In the left side of the colon a small diverticulum was identified.     Retroflexed views revealed no abnormalities.     The scope was then withdrawn from the patient and the procedure completed.  WITHDRAWAL TIME: 14 minutes  COMPLICATIONS: There were no immediate complications. ENDOSCOPIC IMPRESSION: 1) Diverticulum otherwise normal colonoscopy.  RECOMMENDATIONS: 1) Repeat the colonoscopy in 2 years.  REPEAT EXAM:  cc:  _______________________________ eSignedCarol Ada, MD 08-18-2015 9:42 AM   CPT CODES: ICD CODES:  The ICD and CPT codes recommended by this software are interpretations from the data that the clinical staff has captured with the software.  The verification of the translation of this report to the ICD and CPT codes and  modifiers is the sole responsibility of the health care institution and practicing physician where this report was generated.  [employer] will not be held responsible for the validity of the ICD and CPT codes included on this report.  AMA assumes no liability for data contained or not contained herein. CPT is a Designer, television/film set of the [employer].

## 2015-07-31 ENCOUNTER — Emergency Department (HOSPITAL_COMMUNITY): Payer: BLUE CROSS/BLUE SHIELD

## 2015-07-31 ENCOUNTER — Encounter (HOSPITAL_COMMUNITY): Payer: Self-pay | Admitting: *Deleted

## 2015-07-31 ENCOUNTER — Emergency Department (HOSPITAL_COMMUNITY)
Admission: EM | Admit: 2015-07-31 | Discharge: 2015-07-31 | Disposition: A | Discharge status: Home / Self Care | Payer: BLUE CROSS/BLUE SHIELD | Attending: Emergency Medicine | Admitting: Emergency Medicine

## 2015-07-31 DIAGNOSIS — E785 Hyperlipidemia, unspecified: Secondary | ICD-10-CM | POA: Diagnosis not present

## 2015-07-31 DIAGNOSIS — Z8781 Personal history of (healed) traumatic fracture: Secondary | ICD-10-CM | POA: Insufficient documentation

## 2015-07-31 DIAGNOSIS — R52 Pain, unspecified: Secondary | ICD-10-CM

## 2015-07-31 DIAGNOSIS — Z9851 Tubal ligation status: Secondary | ICD-10-CM | POA: Diagnosis not present

## 2015-07-31 DIAGNOSIS — IMO0001 Reserved for inherently not codable concepts without codable children: Secondary | ICD-10-CM

## 2015-07-31 DIAGNOSIS — Z90710 Acquired absence of both cervix and uterus: Secondary | ICD-10-CM | POA: Insufficient documentation

## 2015-07-31 DIAGNOSIS — R079 Chest pain, unspecified: Secondary | ICD-10-CM | POA: Diagnosis not present

## 2015-07-31 DIAGNOSIS — Z79899 Other long term (current) drug therapy: Secondary | ICD-10-CM | POA: Insufficient documentation

## 2015-07-31 DIAGNOSIS — Z85528 Personal history of other malignant neoplasm of kidney: Secondary | ICD-10-CM | POA: Diagnosis not present

## 2015-07-31 DIAGNOSIS — F419 Anxiety disorder, unspecified: Secondary | ICD-10-CM | POA: Insufficient documentation

## 2015-07-31 DIAGNOSIS — K449 Diaphragmatic hernia without obstruction or gangrene: Secondary | ICD-10-CM

## 2015-07-31 DIAGNOSIS — K226 Gastro-esophageal laceration-hemorrhage syndrome: Secondary | ICD-10-CM | POA: Diagnosis not present

## 2015-07-31 DIAGNOSIS — R1013 Epigastric pain: Secondary | ICD-10-CM | POA: Diagnosis present

## 2015-07-31 DIAGNOSIS — Z87448 Personal history of other diseases of urinary system: Secondary | ICD-10-CM | POA: Diagnosis not present

## 2015-07-31 DIAGNOSIS — G8929 Other chronic pain: Secondary | ICD-10-CM | POA: Insufficient documentation

## 2015-07-31 DIAGNOSIS — F410 Panic disorder [episodic paroxysmal anxiety] without agoraphobia: Secondary | ICD-10-CM | POA: Insufficient documentation

## 2015-07-31 DIAGNOSIS — Z7952 Long term (current) use of systemic steroids: Secondary | ICD-10-CM | POA: Insufficient documentation

## 2015-07-31 LAB — CBC
HCT: 35.6 % — ABNORMAL LOW (ref 36.0–46.0)
Hemoglobin: 11.5 g/dL — ABNORMAL LOW (ref 12.0–15.0)
MCH: 27.9 pg (ref 26.0–34.0)
MCHC: 32.3 g/dL (ref 30.0–36.0)
MCV: 86.4 fL (ref 78.0–100.0)
Platelets: 188 10*3/uL (ref 150–400)
RBC: 4.12 MIL/uL (ref 3.87–5.11)
RDW: 13.9 % (ref 11.5–15.5)
WBC: 9.9 10*3/uL (ref 4.0–10.5)

## 2015-07-31 LAB — BASIC METABOLIC PANEL
Anion gap: 10 (ref 5–15)
BUN: 20 mg/dL (ref 6–20)
CO2: 21 mmol/L — ABNORMAL LOW (ref 22–32)
Calcium: 8.1 mg/dL — ABNORMAL LOW (ref 8.9–10.3)
Chloride: 108 mmol/L (ref 101–111)
Creatinine, Ser: 1.37 mg/dL — ABNORMAL HIGH (ref 0.44–1.00)
GFR calc Af Amer: 55 mL/min — ABNORMAL LOW (ref >60)
GFR calc non Af Amer: 47 mL/min — ABNORMAL LOW (ref >60)
Glucose, Bld: 89 mg/dL (ref 65–99)
Potassium: 4.1 mmol/L (ref 3.5–5.1)
Sodium: 139 mmol/L (ref 135–145)

## 2015-07-31 LAB — I-STAT TROPONIN, ED: Troponin i, poc: 0 ng/mL (ref 0.00–0.08)

## 2015-07-31 LAB — LIPASE, BLOOD: Lipase: 36 U/L (ref 22–51)

## 2015-07-31 MED ORDER — DICYCLOMINE HCL 10 MG/ML IM SOLN
20.0000 mg | Freq: Once | INTRAMUSCULAR | Status: AC
  Administered 2015-07-31: 20 mg via INTRAMUSCULAR
  Filled 2015-07-31: qty 2

## 2015-07-31 MED ORDER — HYOSCYAMINE SULFATE 0.125 MG SL SUBL: 0.1250 mg | SUBLINGUAL_TABLET | SUBLINGUAL | Status: DC | PRN

## 2015-07-31 MED ORDER — ONDANSETRON 8 MG PO TBDP: ORAL_TABLET | ORAL | Status: DC

## 2015-07-31 MED ORDER — SUCRALFATE 1 G PO TABS
1.0000 g | ORAL_TABLET | Freq: Once | ORAL | Status: DC
  Filled 2015-07-31: qty 1

## 2015-07-31 MED ORDER — GI COCKTAIL ~~LOC~~
30.0000 mL | Freq: Once | ORAL | Status: AC
Start: 2015-07-31 — End: 2015-07-31
  Administered 2015-07-31: 30 mL via ORAL
  Filled 2015-07-31: qty 30

## 2015-07-31 MED ORDER — OXYCODONE-ACETAMINOPHEN 5-325 MG PO TABS
2.0000 | ORAL_TABLET | Freq: Once | ORAL | Status: AC
  Administered 2015-07-31: 2 via ORAL
  Filled 2015-07-31: qty 2

## 2015-07-31 MED ORDER — PANTOPRAZOLE SODIUM 40 MG IV SOLR
40.0000 mg | Freq: Once | INTRAVENOUS | Status: AC
  Administered 2015-07-31: 40 mg via INTRAVENOUS
  Filled 2015-07-31: qty 40

## 2015-07-31 MED ORDER — KETOROLAC TROMETHAMINE 30 MG/ML IJ SOLN
30.0000 mg | Freq: Once | INTRAMUSCULAR | Status: AC
  Administered 2015-07-31: 30 mg via INTRAVENOUS
  Filled 2015-07-31: qty 1

## 2015-07-31 MED ORDER — SUCRALFATE 1 GM/10ML PO SUSP: 1.0000 g | Freq: Three times a day (TID) | ORAL | Status: DC

## 2015-07-31 NOTE — ED Notes (Signed)
Pt states that she had an EGD / colonoscopy yesterday; pt states that she has felt bloated and uncomfortable since then; pt states that she began top have mid chest pain tonight that radiates to left chest and left shoulder; pt c/o Nausea and vomiting; pt reports that last episode of vomiting was blood tinged; pt reports 3 episodes of vomiting today

## 2015-07-31 NOTE — ED Provider Notes (Signed)
CSN: [id number]     Arrival date & time 07/31/15  0009 History  This chart was scribed for Rashaad Hallstrom, MD by Hansel Feinstein, ED Scribe. This patient was seen in room WA14/WA14 and the patient's care was started at 1:15 AM.     Chief Complaint  Patient presents with  . Chest Pain  . Abdominal Pain   Patient is a 39 y.o. female presenting with chest pain and abdominal pain. The history is provided by the patient. No language interpreter was used.  Chest Pain Pain location:  Epigastric Pain quality comment:  Cramping Pain severity:  Moderate Onset quality:  Sudden Duration:  8 hours Timing:  Constant Progression:  Unchanged Chronicity:  New Context comment:  Attributes CP to vomiting  Relieved by:  Nothing Worsened by:  Nothing tried Associated symptoms: abdominal pain and vomiting   Associated symptoms: no fever, no palpitations and no shortness of breath   Risk factors: no aortic disease   Abdominal Pain Pain location:  Generalized Pain quality: bloating and stabbing   Pain radiates to:  Does not radiate Pain severity:  Moderate Onset quality:  Gradual Duration:  1 day Timing:  Intermittent Progression:  Unchanged Chronicity:  New Context comment:  Recent colonoscopy Relieved by:  Antacids Associated symptoms: chest pain and vomiting   Associated symptoms: no constipation, no diarrhea, no fever and no shortness of breath     HPI Comments: JAZSMINE ROUSSE is a 39 y.o. female with Hx of anemia, chron's disease, hypothyroidism, cancer, HLD, renal disorder who presents to the Emergency Department complaining of moderate, intermittent, stabbing abdominal pain onset yesterday. She states associated CP (attributed to vomiting), emesis, hematemesis (onset the 3rd episode tonight). Pt notes that she has had pain and bloating since her colonoscopy by Carol Ada yesterday. She states no endoscopy was done. Pt was awake during the procedure and is not able to verbalize the amount of gas  she passed post procedure. She went home after the procedure. Pt states that she has similar symptoms with these procedures in the past, but not this severe. She took Zofran, Phenergan, Flexeril with mild relief. She states that she has not followed up with Dr. Benson Norway.  Allergic to Barium-containing compounds; Dilaudid; Hydromorphone; Ivp dye; Shellfish allergy; Vancomycin; Adhesive; Infed; Sulfa antibiotics; and Iopamidol. She denies dialysis.  Past Medical History  Diagnosis Date  . Chronic anemia   . Panic disorder   . Crohn's disease   . Anxiety     OCD  . Fracture 05/01/2013    Right foot, in cast  . Hypothyroidism     parathroid runs low  . History of blood transfusion 2 years ago  . Cancer 2001    renal cell , breast cancer both breast 2011, cervical cancer 2013  . Hyperlipidemia   . Chronic pain   . Complication of anesthesia     hard to sedate dr hung aware  . PONV (postoperative nausea and vomiting)   . Renal disorder     S/p '11-Kidney transplant-Dr. Florene Glen follows   Past Surgical History  Procedure Laterality Date  . Nephrectomy transplanted organ  1999 and 2011    last '11-[facility]  . Parathyroidectomy  02-20-2001  . Knee surgery Left Fidencio Duddy 2003    arthroscopy  . Cystoscopy  02/13/2012    Procedure: CYSTOSCOPY;  Surgeon: Franchot Gallo, MD;  Location: [facility];  Service: Urology;  Laterality: N/A;  insertion of ureteral catheter , removed per Dr Diona Fanti   . Tubal ligation  02-19-2000  . Abdominal hysterectomy  02/13/2012    Procedure: HYSTERECTOMY ABDOMINAL;  Surgeon: Cyril Mourning, MD;  Location: [facility];  Service: Gynecology;  Laterality: N/A;  . Breast lumpectomy and reconstruction Bilateral 2011  . Esophageal manometry N/A 08/10/2013    Procedure: ESOPHAGEAL MANOMETRY (EM);  Surgeon: Beryle Beams, MD;  Location: [facility];  Service: Endoscopy;  Laterality: N/A;  . Incision and drainage abscess N/A 08/26/2013    Procedure: INCISION AND DRAINAGE PERIRECTAL  ABSCESS;  Surgeon: Jamesetta So, MD;  Location: [facility];  Service: General;  Laterality: N/A;  . Esophagogastroduodenoscopy N/A 09/25/2013    Procedure: ESOPHAGOGASTRODUODENOSCOPY (EGD);  Surgeon: Beryle Beams, MD;  Location: [facility];  Service: Endoscopy;  Laterality: N/A;  . Colonoscopy N/A 09/25/2013    Procedure: COLONOSCOPY;  Surgeon: Beryle Beams, MD;  Location: [facility];  Service: Endoscopy;  Laterality: N/A;  . Groin dissection Left 10/09/2013    Procedure: INCISION AND DRAINAGE OF LEFT GROIN ABSCESS;  Surgeon: Shann Medal, MD;  Location: [facility];  Service: General;  Laterality: Left;  . Breast surgery      bilateral lumpectomy 4'11  . Esophagogastroduodenoscopy N/A 11/27/2013    Procedure: ESOPHAGOGASTRODUODENOSCOPY (EGD);  Surgeon: Beryle Beams, MD;  Location: [facility];  Service: Endoscopy;  Laterality: N/A;  . Bravo ph study N/A 11/27/2013    Procedure: BRAVO [place] STUDY;  Surgeon: Beryle Beams, MD;  Location: [facility];  Service: Endoscopy;  Laterality: N/A;  . Esophagogastroduodenoscopy N/A 12/11/2013    Procedure: ESOPHAGOGASTRODUODENOSCOPY (EGD);  Surgeon: Beryle Beams, MD;  Location: [facility];  Service: Endoscopy;  Laterality: N/A;  . Bravo ph study N/A 12/11/2013    Procedure: BRAVO [place] STUDY;  Surgeon: Beryle Beams, MD;  Location: [facility];  Service: Endoscopy;  Laterality: N/A;  . Lower extremity arterial dopllers  05/06/2012    Bilat ABIs were attempted-not accessible due to calcified veins. Bilat TBIs demonstrated no obtainable flow through both great toes. Bilat PVR ankle regions demonstrated moderately abnormal pulsatile flow. Lft SFA demonstrated a 50-69% diameter reduction. Lft runoff demonstrated occlusive diseasewith reconstitution of flow noted distally.  . Sleeve gastroplasty      for GERD, hiatal hernia   Family History  Problem Relation Age of Onset  . Diabetes Mother   . Hypertension Mother   . Diabetes Father   . Hypertension  Father   . Cancer Father     Prostate cancer  . Obesity Sister   . Stroke Maternal Grandmother   . Heart attack Maternal Grandfather   . Polycystic ovary syndrome Sister    History  Substance Use Topics  . Smoking status: Never Smoker   . Smokeless tobacco: Never Used  . Alcohol Use: No   OB History    No data available     Review of Systems  Constitutional: Negative for fever.  Respiratory: Negative for chest tightness and shortness of breath.   Cardiovascular: Positive for chest pain. Negative for palpitations and leg swelling.  Gastrointestinal: Positive for vomiting and abdominal pain. Negative for diarrhea and constipation.  All other systems reviewed and are negative.  Allergies  Barium-containing compounds; Dilaudid; Hydromorphone; Ivp dye; Shellfish allergy; Vancomycin; Adhesive; Infed; Sulfa antibiotics; and Iopamidol  Home Medications   Prior to Admission medications   Medication Sig Start Date End Date Taking? Authorizing Provider  atorvastatin (LIPITOR) 20 MG tablet Take 20 mg by mouth at bedtime.   Yes Historical Provider, MD  calcitRIOL (ROCALTROL) 0.25 MCG  capsule Take 1.75 mcg by mouth every morning. Patient states that she takes 6 total 05/05/13  Yes Niel Hummer, NP  calcium carbonate (OS-CAL - DOSED IN MG OF ELEMENTAL CALCIUM) 1250 MG tablet Take 1 tablet by mouth 3 (three) times daily. 05/05/13  Yes Niel Hummer, NP  cetirizine (ZYRTEC) 10 MG tablet Take 10 mg by mouth every evening. 05/05/13  Yes Niel Hummer, NP  cyclobenzaprine (FLEXERIL) 10 MG tablet Take 10 mg by mouth 3 (three) times daily as needed for muscle spasms.   Yes Historical Provider, MD  mycophenolate (MYFORTIC) 180 MG EC tablet Take 540 mg by mouth 2 (two) times daily. 05/05/13  Yes Niel Hummer, NP  omeprazole (PRILOSEC) 20 MG capsule Take 20 mg by mouth 2 (two) times daily.   Yes Historical Provider, MD  ondansetron (ZOFRAN) 8 MG tablet Take 8 mg by mouth 2 (two) times daily as needed for  nausea or vomiting.   Yes Historical Provider, MD  Oxycodone HCl 10 MG TABS Take 1 tablet (10 mg total) by mouth 3 (three) times daily as needed (pain). For pain 04/19/15  Yes Susy Frizzle, MD  predniSONE (DELTASONE) 10 MG tablet Take 10 mg by mouth daily with breakfast.    Yes Historical Provider, MD  promethazine (PHENERGAN) 25 MG tablet Take 1 tablet (25 mg total) by mouth every 6 (six) hours as needed for nausea or vomiting. 06/16/15  Yes Susy Frizzle, MD  tacrolimus (PROGRAF) 1 MG capsule Take 3 mg by mouth 2 (two) times daily.   Yes Historical Provider, MD  cyclobenzaprine (FLEXERIL) 10 MG tablet TAKE ONE TABLET BY MOUTH THREE TIMES DAILY AS NEEDED FOR MUSCLE SPASMS. Patient not taking: Reported on 07/31/2015 06/16/15   Susy Frizzle, MD  fluocinonide cream (LIDEX) [id number] % Apply 1 application topically daily as needed (flare up as needed).    Historical Provider, MD  LINZESS 145 MCG CAPS capsule TAKE ONE CAPSULE BY MOUTH EVERY DAY. Patient taking differently: TAKE ONE CAPSULE BY MOUTH EVERY DAY PRN CONSTIPATION 06/15/15   Susy Frizzle, MD   BP 111/68 mmHg  Pulse 78  Temp(Src) 98.2 F (36.8 C) (Oral)  Resp 13  Ht 5\' 2"  (1.575 m)  Wt 170 lb (77.111 kg)  BMI 31.09 kg/m2  SpO2 98%  LMP 01/30/2012 Physical Exam  Constitutional: She is oriented to person, place, and time. She appears well-developed and well-nourished.  HENT:  Head: Normocephalic and atraumatic.  Mouth/Throat: Oropharynx is clear and moist.  Eyes: Conjunctivae and EOM are normal. Pupils are equal, round, and reactive to light.  Neck: Normal range of motion. Neck supple.  Cardiovascular: Normal rate, regular rhythm and intact distal pulses.   Pulmonary/Chest: Effort normal. No respiratory distress. She has no wheezes. She has no rales.  Abdominal: Soft. She exhibits no distension and no mass. Bowel sounds are increased. There is no tenderness. There is no rigidity, no rebound, no guarding, no tenderness at  McBurney's point and negative Murphy's sign.  Hyperactive bowel sounds into the thoracic cavity and in the abdomen throughout.   Musculoskeletal: Normal range of motion. She exhibits no edema or tenderness.  Neurological: She is alert and oriented to person, place, and time. She has normal reflexes.  Skin: Skin is warm and dry.  Psychiatric: She has a normal mood and affect. Her behavior is normal.  Nursing note and vitals reviewed.  ED Course  Procedures (including critical care time) DIAGNOSTIC STUDIES: Oxygen Saturation is 98% on  RA, normal by my interpretation.    COORDINATION OF CARE: 1:20 AM Discussed treatment plan with pt at bedside and pt agreed to plan.   Labs Review Labs Reviewed  BASIC METABOLIC PANEL  CBC    Imaging Review Dg Chest 2 View  07/31/2015   CLINICAL DATA:  Status post colonoscopy July 29, 2015. Subsequent upper abdominal chest pain, vomiting blood.  EXAM: CHEST  2 VIEW  COMPARISON:  Chest radiograph November 07, 2014  FINDINGS: Cardiomediastinal silhouette is normal. The lungs are clear without pleural effusions or focal consolidations. Trachea projects midline and there is no pneumothorax. Soft tissue planes and included osseous structures are non-suspicious. Surgical clips in the RIGHT thoracic inlet suggest prior thyroidectomy. Vascular clip and apparent myositis ossifications included LEFT humerus soft tissues. Surgical clips in the included right abdomen compatible with cholecystectomy. Additional surgical clips in LEFT upper quadrant.  IMPRESSION: No acute cardiopulmonary process.   Electronically Signed   By: Elon Alas M.D.   On: 07/31/2015 00:42     EKG Interpretation   Date/Time:  Sunday July 31 2015 00:26:53 EDT Ventricular Rate:  74 PR Interval:  172 QRS Duration: 81 QT Interval:  408 QTC Calculation: 453 R Axis:   93 Text Interpretation:  Sinus rhythm Confirmed by [facility]  MD, Safire Gordin  ([id number]) on 07/31/2015 12:35:27 AM       MDM   Final diagnoses:  None    Rules out for MI with negative EKG and troponin.  Abdominal pain is consistent with gas and cramping likely post procedure.  Symptoms are clearly related to gas and bloating.  Scant blood from multiple episodes of emesis is consistent with mallory weiss tear.  Call your GI doctor in the am I personally performed the services described in this documentation, which was scribed in my presence. The recorded information has been reviewed and is accurate.     Veatrice Kells, MD 07/31/15 [phone]

## 2015-08-01 ENCOUNTER — Encounter (HOSPITAL_COMMUNITY): Payer: Self-pay | Admitting: Gastroenterology

## 2015-08-03 NOTE — Patient Outreach (Signed)
[facility]) Care Management  08/03/2015  [name] 25-Apr-1976 [id number]   Notification from [name], RN to close case due to patient refused [facility] services.  Thanks, [name]. [place], [place] Assistant Phone: [phone] Fax: [phone]

## 2015-08-24 ENCOUNTER — Telehealth: Payer: Self-pay | Admitting: *Deleted

## 2015-08-24 NOTE — Telephone Encounter (Signed)
Submitted [employer] referral thru acuity connect for authorization to Dr. Imagene Riches with authorization [phone]  Requesting provider: Flonnie Hailstone  Treating provider: Imagene Riches  Number of visits:6  Start Date: 08/22/15  End Date:02/18/16  Dx: R01.1-Cardiac murmur, unspecified

## 2015-10-31 ENCOUNTER — Telehealth: Payer: Self-pay | Admitting: Physician Assistant

## 2015-10-31 NOTE — Telephone Encounter (Signed)
Jenny Reichmann called requesting a [employer] referral for patient. I don't see that we have ever referred patient to a plastic and reconstructive surgery. She states patient has surgery scheduled for 11/10/2015. We haven't seen patient since 03/2015 and in EPIC we are no longer listed as her PCP.  NPI [id number] Procedure [id number] dx M79.3  I have left msg for Jenny Reichmann to return my call so I can advise her that we can't do referral since we haven't seen patient and she no longer has [place] listed as her PCP.

## 2015-10-31 NOTE — Telephone Encounter (Signed)
Jenny Reichmann called back and she is aware that since patient no longer has her as her PCP and hasn't been seen for this matter we will not be able to make this referral request.

## 2015-12-21 ENCOUNTER — Other Ambulatory Visit: Payer: Self-pay | Admitting: Family Medicine

## 2016-05-01 ENCOUNTER — Emergency Department (HOSPITAL_COMMUNITY): Payer: BLUE CROSS/BLUE SHIELD

## 2016-05-01 ENCOUNTER — Emergency Department (HOSPITAL_COMMUNITY)
Admission: EM | Admit: 2016-05-01 | Discharge: 2016-05-01 | Disposition: A | Discharge status: Home / Self Care | Payer: BLUE CROSS/BLUE SHIELD | Attending: Emergency Medicine | Admitting: Emergency Medicine

## 2016-05-01 ENCOUNTER — Encounter (HOSPITAL_COMMUNITY): Payer: Self-pay | Admitting: Emergency Medicine

## 2016-05-01 DIAGNOSIS — Z79891 Long term (current) use of opiate analgesic: Secondary | ICD-10-CM | POA: Insufficient documentation

## 2016-05-01 DIAGNOSIS — E785 Hyperlipidemia, unspecified: Secondary | ICD-10-CM | POA: Diagnosis not present

## 2016-05-01 DIAGNOSIS — E860 Dehydration: Secondary | ICD-10-CM

## 2016-05-01 DIAGNOSIS — Z853 Personal history of malignant neoplasm of breast: Secondary | ICD-10-CM | POA: Insufficient documentation

## 2016-05-01 DIAGNOSIS — Z85528 Personal history of other malignant neoplasm of kidney: Secondary | ICD-10-CM | POA: Diagnosis not present

## 2016-05-01 DIAGNOSIS — E039 Hypothyroidism, unspecified: Secondary | ICD-10-CM | POA: Insufficient documentation

## 2016-05-01 DIAGNOSIS — R55 Syncope and collapse: Secondary | ICD-10-CM | POA: Diagnosis present

## 2016-05-01 DIAGNOSIS — Z79899 Other long term (current) drug therapy: Secondary | ICD-10-CM | POA: Insufficient documentation

## 2016-05-01 DIAGNOSIS — Z7952 Long term (current) use of systemic steroids: Secondary | ICD-10-CM | POA: Insufficient documentation

## 2016-05-01 DIAGNOSIS — K5090 Crohn's disease, unspecified, without complications: Secondary | ICD-10-CM | POA: Insufficient documentation

## 2016-05-01 HISTORY — DX: Benign neoplasm of meninges, unspecified: D32.9

## 2016-05-01 LAB — CBC WITH DIFFERENTIAL/PLATELET
Basophils Absolute: 0 10*3/uL (ref 0.0–0.1)
Basophils Relative: 0 %
Eosinophils Absolute: 0 10*3/uL (ref 0.0–0.7)
Eosinophils Relative: 0 %
HCT: 32.1 % — ABNORMAL LOW (ref 36.0–46.0)
Hemoglobin: 10.4 g/dL — ABNORMAL LOW (ref 12.0–15.0)
Lymphocytes Relative: 19 %
Lymphs Abs: 0.9 10*3/uL (ref 0.7–4.0)
MCH: 26.6 pg (ref 26.0–34.0)
MCHC: 32.4 g/dL (ref 30.0–36.0)
MCV: 82.1 fL (ref 78.0–100.0)
Monocytes Absolute: 0.2 10*3/uL (ref 0.1–1.0)
Monocytes Relative: 5 %
Neutro Abs: 3.5 10*3/uL (ref 1.7–7.7)
Neutrophils Relative %: 76 %
Platelets: 249 10*3/uL (ref 150–400)
RBC: 3.91 MIL/uL (ref 3.87–5.11)
RDW: 13.6 % (ref 11.5–15.5)
WBC: 4.7 10*3/uL (ref 4.0–10.5)

## 2016-05-01 LAB — COMPREHENSIVE METABOLIC PANEL
ALT: 10 U/L — ABNORMAL LOW (ref 14–54)
AST: 12 U/L — ABNORMAL LOW (ref 15–41)
Albumin: 4.2 g/dL (ref 3.5–5.0)
Alkaline Phosphatase: 56 U/L (ref 38–126)
Anion gap: 12 (ref 5–15)
BUN: 25 mg/dL — ABNORMAL HIGH (ref 6–20)
CO2: 22 mmol/L (ref 22–32)
Calcium: 9 mg/dL (ref 8.9–10.3)
Chloride: 102 mmol/L (ref 101–111)
Creatinine, Ser: 1.85 mg/dL — ABNORMAL HIGH (ref 0.44–1.00)
GFR calc Af Amer: 38 mL/min — ABNORMAL LOW (ref >60)
GFR calc non Af Amer: 33 mL/min — ABNORMAL LOW (ref >60)
Glucose, Bld: 144 mg/dL — ABNORMAL HIGH (ref 65–99)
Potassium: 4.5 mmol/L (ref 3.5–5.1)
Sodium: 136 mmol/L (ref 135–145)
Total Bilirubin: 0.5 mg/dL (ref 0.3–1.2)
Total Protein: 7.1 g/dL (ref 6.5–8.1)

## 2016-05-01 MED ORDER — SODIUM CHLORIDE 0.9 % IV BOLUS (SEPSIS)
1000.0000 mL | Freq: Once | INTRAVENOUS | Status: AC
  Administered 2016-05-01: 1000 mL via INTRAVENOUS

## 2016-05-01 NOTE — Discharge Instructions (Signed)
Follow up with your md this week for recheck.  Stay hydrated

## 2016-05-01 NOTE — ED Notes (Signed)
Attempted an IV x 2 with being unsuccessful. Have notified Brandy L RN to attempt an IV.

## 2016-05-01 NOTE — ED Notes (Signed)
Pt states that she has a meningioma.  States that she passed out for "30 minutes" at school.  Pt is ambulatory and alert and oriented at this time.  Pt states she has had a headache x 2 wks.

## 2016-05-01 NOTE — ED Notes (Signed)
Patient getting restless and ready for discharge. Notified provider.

## 2016-05-01 NOTE — ED Provider Notes (Signed)
CSN: [id number]     Arrival date & time 05/01/16  1552 History   First MD Initiated Contact with Patient 05/01/16 1559     Chief Complaint  Patient presents with  . Loss of Consciousness  . Meningioma      (Consider location/radiation/quality/duration/timing/severity/associated sxs/prior Treatment) Patient is a 40 y.o. female presenting with syncope. The history is provided by the patient (Patient states that she was having headache felt dizzy and weak she passed out today and she was confused for maybe 30 minutes. Patient does remember what happened during the confusion. Patient is back to normal now).  Loss of Consciousness Episode history:  Single Most recent episode:  Today Timing:  Rare Progression:  Resolved Chronicity:  New Context: not blood draw   Associated symptoms: no chest pain, no headaches and no seizures     Past Medical History  Diagnosis Date  . Chronic anemia   . Panic disorder   . Crohn's disease ([place])   . Anxiety     OCD  . Fracture 05/01/2013    Right foot, in cast  . Hypothyroidism     parathroid runs low  . History of blood transfusion 2 years ago  . Cancer [facility]) 2001    renal cell , breast cancer both breast 2011, cervical cancer 2013  . Hyperlipidemia   . Chronic pain   . Complication of anesthesia     hard to sedate dr hung aware  . PONV (postoperative nausea and vomiting)   . Renal disorder     S/p '11-Kidney transplant-Dr. Florene Glen follows  . Meningioma [facility])    Past Surgical History  Procedure Laterality Date  . Nephrectomy transplanted organ  1999 and 2011    last '11-[facility]  . Parathyroidectomy  02-20-2001  . Knee surgery Left april 2003    arthroscopy  . Cystoscopy  02/13/2012    Procedure: CYSTOSCOPY;  Surgeon: Franchot Gallo, MD;  Location: [place] [facility];  Service: Urology;  Laterality: N/A;  insertion of ureteral catheter , removed per Dr Diona Fanti   . Tubal ligation  02-19-2000  . Abdominal hysterectomy  02/13/2012    Procedure:  HYSTERECTOMY ABDOMINAL;  Surgeon: Cyril Mourning, MD;  Location: [place] [facility];  Service: Gynecology;  Laterality: N/A;  . Breast lumpectomy and reconstruction Bilateral 2011  . Esophageal manometry N/A 08/10/2013    Procedure: ESOPHAGEAL MANOMETRY (EM);  Surgeon: Beryle Beams, MD;  [facility];  Service: Endoscopy;  Laterality: N/A;  . Incision and drainage abscess N/A 08/26/2013    Procedure: INCISION AND DRAINAGE PERIRECTAL ABSCESS;  Surgeon: Jamesetta So, MD;  Location: [facility];  Service: General;  Laterality: N/A;  . Esophagogastroduodenoscopy N/A 09/25/2013    Procedure: ESOPHAGOGASTRODUODENOSCOPY (EGD);  Surgeon: Beryle Beams, MD;  Location: [facility];  Service: Endoscopy;  Laterality: N/A;  . Colonoscopy N/A 09/25/2013    Procedure: COLONOSCOPY;  Surgeon: Beryle Beams, MD;  [facility];  Service: Endoscopy;  Laterality: N/A;  . Groin dissection Left 10/09/2013    Procedure: INCISION AND DRAINAGE OF LEFT GROIN ABSCESS;  Surgeon: Shann Medal, MD;  [facility]: [facility];  Service: General;  Laterality: Left;  . Breast surgery      bilateral lumpectomy 4'11  . Esophagogastroduodenoscopy N/A 11/27/2013    Procedure: ESOPHAGOGASTRODUODENOSCOPY (EGD);  Surgeon: Beryle Beams, MD;  Location: [facility];  Service: Endoscopy;  Laterality: N/A;  . Bravo ph study N/A 11/27/2013    Procedure: BRAVO [place] STUDY;  Surgeon: Beryle Beams, MD;  [facility];  Service: Endoscopy;  Laterality: N/A;  . Esophagogastroduodenoscopy N/A 12/11/2013    Procedure: ESOPHAGOGASTRODUODENOSCOPY (EGD);  Surgeon: Beryle Beams, MD;  Location: [facility];  Service: Endoscopy;  Laterality: N/A;  . Bravo ph study N/A 12/11/2013    Procedure: BRAVO [place] STUDY;  Surgeon: Beryle Beams, MD;  [facility];  Service: Endoscopy;  Laterality: N/A;  . Lower extremity arterial dopllers  05/06/2012    Bilat ABIs were attempted-not accessible due to calcified veins. Bilat TBIs demonstrated  no obtainable flow through both great toes. Bilat PVR ankle regions demonstrated moderately abnormal pulsatile flow. Lft SFA demonstrated a 50-69% diameter reduction. Lft runoff demonstrated occlusive diseasewith reconstitution of flow noted distally.  . Sleeve gastroplasty      for GERD, hiatal hernia  . Colonoscopy with propofol N/A 07/29/2015    Procedure: COLONOSCOPY WITH PROPOFOL;  Surgeon: Carol Ada, MD;  [facility];  Service: Endoscopy;  Laterality: N/A;   Family History  Problem Relation Age of Onset  . Diabetes Mother   . Hypertension Mother   . Diabetes Father   . Hypertension Father   . Cancer Father     Prostate cancer  . Obesity Sister   . Stroke Maternal Grandmother   . Heart attack Maternal Grandfather   . Polycystic ovary syndrome Sister    Social History  Substance Use Topics  . Smoking status: Never Smoker   . Smokeless tobacco: Never Used  . Alcohol Use: No   OB History    No data available     Review of Systems  Constitutional: Negative for appetite change and fatigue.  HENT: Negative for congestion, ear discharge and sinus pressure.   Eyes: Negative for discharge.  Respiratory: Negative for cough.   Cardiovascular: Positive for syncope. Negative for chest pain.  Gastrointestinal: Negative for abdominal pain and diarrhea.  Genitourinary: Negative for frequency and hematuria.  Musculoskeletal: Negative for back pain.  Skin: Negative for rash.  Neurological: Positive for light-headedness. Negative for seizures and headaches.  Psychiatric/Behavioral: Negative for hallucinations.      Allergies  Barium-containing compounds; Dilaudid; Hydromorphone; Ivp dye; Shellfish allergy; Vancomycin; Adhesive; Infed; Sulfa antibiotics; and Iopamidol  Home Medications   Prior to Admission medications   Medication Sig Start Date End Date Taking? Authorizing Provider  ALPRAZolam Duanne Moron) 0.5 MG tablet Take 0.5 mg by mouth at bedtime.  02/14/16  Yes  Historical Provider, MD  calcitRIOL (ROCALTROL) 0.25 MCG capsule Take 1.75 mcg by mouth every morning. Patient states that she takes 6 total 05/05/13  Yes Niel Hummer, NP  calcium carbonate (OS-CAL - DOSED IN MG OF ELEMENTAL CALCIUM) 1250 MG tablet Take 1 tablet by mouth 3 (three) times daily. 05/05/13  Yes Niel Hummer, NP  cetirizine (ZYRTEC) 10 MG tablet Take 10 mg by mouth every evening. 05/05/13  Yes Niel Hummer, NP  cyclobenzaprine (FLEXERIL) 10 MG tablet TAKE ONE TABLET BY MOUTH THREE TIMES DAILY AS NEEDED FOR MUSCLE SPASMS. 06/16/15  Yes Susy Frizzle, MD  fluocinonide cream (LIDEX) [id number] % Apply 1 application topically daily as needed (flare up as needed).   Yes Historical Provider, MD  FLUoxetine (PROZAC) 20 MG capsule Take 60 mg by mouth daily.   Yes Historical Provider, MD  LINZESS 145 MCG CAPS capsule TAKE ONE CAPSULE BY MOUTH EVERY DAY. Patient taking differently: TAKE ONE CAPSULE BY MOUTH EVERY DAY PRN CONSTIPATION 06/15/15  Yes Susy Frizzle, MD  mycophenolate (MYFORTIC) 180 MG EC tablet Take  540 mg by mouth 2 (two) times daily. 05/05/13  Yes Niel Hummer, NP  omeprazole (PRILOSEC) 40 MG capsule Take 40 mg by mouth 2 (two) times daily.    Yes Historical Provider, MD  ondansetron (ZOFRAN ODT) 8 MG disintegrating tablet 8mg  ODT q8 hours prn nausea 07/31/15  Yes April Palumbo, MD  ondansetron (ZOFRAN) 8 MG tablet Take 8 mg by mouth 2 (two) times daily as needed for nausea or vomiting.   Yes Historical Provider, MD  predniSONE (DELTASONE) 10 MG tablet Take 10 mg by mouth daily with breakfast.    Yes Historical Provider, MD  promethazine (PHENERGAN) 25 MG tablet TAKE 1 TABLET BY MOUTH EVERY SIX HOURS AS NEEDED FOR NAUSEA/VOMITING. 12/21/15  Yes Susy Frizzle, MD  promethazine (PHENERGAN) 25 MG tablet Take 25 mg by mouth every 6 (six) hours as needed for nausea or vomiting.    Yes Historical Provider, MD  simvastatin (ZOCOR) 20 MG tablet Take 20 mg by mouth at bedtime.   Yes  Historical Provider, MD  tacrolimus (PROGRAF) 1 MG capsule Take 3 mg by mouth 2 (two) times daily.   Yes Historical Provider, MD  topiramate (TOPAMAX) 25 MG tablet Take 25 mg by mouth daily.  12/23/15  Yes Historical Provider, MD  ZOLPIDEM TARTRATE ER PO Take 5 mg by mouth at bedtime.   Yes Historical Provider, MD  hyoscyamine (LEVSIN/SL) 0.125 MG SL tablet Place 1 tablet (0.125 mg total) under the tongue every 4 (four) hours as needed. Patient not taking: Reported on 05/01/2016 07/31/15   April Palumbo, MD  Oxycodone HCl 10 MG TABS Take 1 tablet (10 mg total) by mouth 3 (three) times daily as needed (pain). For pain 04/19/15   Susy Frizzle, MD  sucralfate (CARAFATE) 1 GM/10ML suspension Take 10 mLs (1 g total) by mouth 4 (four) times daily -  with meals and at bedtime. Patient not taking: Reported on 05/01/2016 07/31/15   April Palumbo, MD   BP 138/74 mmHg  Pulse 75  Temp(Src) 98 F (36.7 C) (Oral)  Resp 16  SpO2 100%  LMP 01/30/2012 Physical Exam  Constitutional: She is oriented to person, place, and time. She appears well-developed.  HENT:  Head: Normocephalic.  Eyes: Conjunctivae and EOM are normal. No scleral icterus.  Neck: Neck supple. No thyromegaly present.  Cardiovascular: Normal rate and regular rhythm.  Exam reveals no gallop and no friction rub.   No murmur heard. Pulmonary/Chest: No stridor. She has no wheezes. She has no rales. She exhibits no tenderness.  Abdominal: She exhibits no distension. There is no tenderness. There is no rebound.  Musculoskeletal: Normal range of motion. She exhibits no edema.  Lymphadenopathy:    She has no cervical adenopathy.  Neurological: She is oriented to person, place, and time. She exhibits normal muscle tone. Coordination normal.  Skin: No rash noted. No erythema.  Psychiatric: She has a normal mood and affect. Her behavior is normal.    ED Course  Procedures (including critical care time) Labs Review Labs Reviewed  CBC WITH  DIFFERENTIAL/PLATELET - Abnormal; Notable for the following:    Hemoglobin 10.4 (*)    HCT 32.1 (*)    All other components within normal limits  COMPREHENSIVE METABOLIC PANEL - Abnormal; Notable for the following:    Glucose, Bld 144 (*)    BUN 25 (*)    Creatinine, Ser 1.85 (*)    AST 12 (*)    ALT 10 (*)    GFR calc non  Af Amer 33 (*)    GFR calc Af Amer 38 (*)    All other components within normal limits    Imaging Review Ct Head Wo Contrast  05/01/2016  CLINICAL DATA:  Prolonged loss of consciousness for about 30 minutes at school today. Headache for the past 2 weeks. Known meningioma according to the patient. EXAM: CT HEAD WITHOUT CONTRAST TECHNIQUE: Contiguous axial images were obtained from the base of the skull through the vertex without intravenous contrast. COMPARISON:  11/06/2011. FINDINGS: Normal appearing cerebral hemispheres and posterior fossa structures. Normal size and position of the ventricles. No intracranial hemorrhage, mass lesion or CT evidence of acute infarction. Unremarkable bones and included paranasal sinuses. IMPRESSION: Normal examination. Electronically Signed   By: Claudie Revering M.D.   On: 05/01/2016 18:14   I have personally reviewed and evaluated these images and lab results as part of my medical decision-making.   EKG Interpretation   Date/Time:  Tuesday May 01 2016 16:27:38 EDT Ventricular Rate:  71 PR Interval:  186 QRS Duration: 85 QT Interval:  423 QTC Calculation: 460 R Axis:   25 Text Interpretation:  Sinus rhythm Baseline wander in lead(s) II aVR  Confirmed by Donnette Macmullen  MD, Elina Streng [phone]) on 05/01/2016 8:23:41 PM      MDM   Final diagnoses:  Near syncope  Dehydration    EKG CT of the head CBC and cmet unremarkable patient was orthostatic and has been hydrated. Patient's episode could be an unusual migraine or possible seizure. Patient is on Topamax for seizures. Patient instructed to stay hydrated follow-up with her family doctor this  week and she may need to follow back up with her neurologist    Milton Ferguson, MD 05/01/16 2030

## 2016-06-15 ENCOUNTER — Other Ambulatory Visit: Payer: Self-pay | Admitting: Family Medicine

## 2016-11-05 ENCOUNTER — Emergency Department (HOSPITAL_COMMUNITY)
Admission: EM | Admit: 2016-11-05 | Discharge: 2016-11-05 | Disposition: A | Discharge status: Home / Self Care | Payer: Managed Care, Other (non HMO) | Attending: Emergency Medicine | Admitting: Emergency Medicine

## 2016-11-05 ENCOUNTER — Encounter (HOSPITAL_COMMUNITY): Payer: Self-pay | Admitting: Emergency Medicine

## 2016-11-05 DIAGNOSIS — E039 Hypothyroidism, unspecified: Secondary | ICD-10-CM | POA: Insufficient documentation

## 2016-11-05 DIAGNOSIS — E1122 Type 2 diabetes mellitus with diabetic chronic kidney disease: Secondary | ICD-10-CM | POA: Diagnosis not present

## 2016-11-05 DIAGNOSIS — I120 Hypertensive chronic kidney disease with stage 5 chronic kidney disease or end stage renal disease: Secondary | ICD-10-CM | POA: Insufficient documentation

## 2016-11-05 DIAGNOSIS — R600 Localized edema: Secondary | ICD-10-CM | POA: Diagnosis not present

## 2016-11-05 DIAGNOSIS — M7989 Other specified soft tissue disorders: Secondary | ICD-10-CM | POA: Diagnosis present

## 2016-11-05 DIAGNOSIS — Z853 Personal history of malignant neoplasm of breast: Secondary | ICD-10-CM | POA: Insufficient documentation

## 2016-11-05 DIAGNOSIS — Z79899 Other long term (current) drug therapy: Secondary | ICD-10-CM | POA: Insufficient documentation

## 2016-11-05 DIAGNOSIS — M79604 Pain in right leg: Secondary | ICD-10-CM

## 2016-11-05 DIAGNOSIS — N186 End stage renal disease: Secondary | ICD-10-CM | POA: Diagnosis not present

## 2016-11-05 LAB — CBC
HCT: 34.6 % — ABNORMAL LOW (ref 36.0–46.0)
Hemoglobin: 10.9 g/dL — ABNORMAL LOW (ref 12.0–15.0)
MCH: 27.3 pg (ref 26.0–34.0)
MCHC: 31.5 g/dL (ref 30.0–36.0)
MCV: 86.5 fL (ref 78.0–100.0)
Platelets: 229 10*3/uL (ref 150–400)
RBC: 4 MIL/uL (ref 3.87–5.11)
RDW: 13.5 % (ref 11.5–15.5)
WBC: 9.2 10*3/uL (ref 4.0–10.5)

## 2016-11-05 LAB — BASIC METABOLIC PANEL
Anion gap: 9 (ref 5–15)
BUN: 25 mg/dL — ABNORMAL HIGH (ref 6–20)
CO2: 22 mmol/L (ref 22–32)
Calcium: 7.2 mg/dL — ABNORMAL LOW (ref 8.9–10.3)
Chloride: 107 mmol/L (ref 101–111)
Creatinine, Ser: 1.47 mg/dL — ABNORMAL HIGH (ref 0.44–1.00)
GFR calc Af Amer: 50 mL/min — ABNORMAL LOW (ref >60)
GFR calc non Af Amer: 43 mL/min — ABNORMAL LOW (ref >60)
Glucose, Bld: 124 mg/dL — ABNORMAL HIGH (ref 65–99)
Potassium: 4.3 mmol/L (ref 3.5–5.1)
Sodium: 138 mmol/L (ref 135–145)

## 2016-11-05 LAB — PROTIME-INR
INR: 0.94
Prothrombin Time: 12.6 seconds (ref 11.4–15.2)

## 2016-11-05 MED ORDER — ENOXAPARIN SODIUM 80 MG/0.8ML ~~LOC~~ SOLN
1.0000 mg/kg | Freq: Once | SUBCUTANEOUS | Status: AC
  Administered 2016-11-05: 80 mg via SUBCUTANEOUS
  Filled 2016-11-05: qty 0.8

## 2016-11-05 NOTE — ED Provider Notes (Addendum)
[facility] DEPT Provider Note   CSN: [id number] Arrival date & time: 11/05/16  1815     History   Chief Complaint Chief Complaint  Patient presents with  . Leg Pain    HPI [name] is a 40 y.o. female.   Leg Pain    Pt noticed swelling and bruising in the right leg below the knee, medial aspect today.   She has had some soreness.  No chest pain or shortness of breath.  She has had some uri sx recently with coughing.  No history of DVT or PE.  She has a history of kidney transplant.  Renal function was 1.55 last Thursday.  Pt saw her PCP today who sent her to the ED for evaluation.  Past Medical History:  Diagnosis Date  . Anxiety    OCD  . Cancer [facility]) 2001   renal cell , breast cancer both breast 2011, cervical cancer 2013  . Chronic anemia   . Chronic pain   . Complication of anesthesia    hard to sedate dr hung aware  . Crohn's disease ([place])   . Fracture 05/01/2013   Right foot, in cast  . History of blood transfusion 2 years ago  . Hyperlipidemia   . Hypothyroidism    parathroid runs low  . Meningioma ([place])   . Panic disorder   . PONV (postoperative nausea and vomiting)   . Renal disorder    S/p '11-Kidney transplant-Dr. Florene Glen follows    Patient Active Problem List   Diagnosis Date Noted  . UTI (lower urinary tract infection) 11/20/2013  . CKD (chronic kidney disease) stage 3, GFR 30-59 ml/min 08/25/2013  . Perirectal abscess 08/24/2013  . ESRD (end stage renal disease) ([place]) 03/27/2011  . Kidney transplant status, living unrelated donor 03/27/2011  . Anemia 03/27/2011  . Type 2 diabetes mellitus ([place]) 03/27/2011  . Osteoarthritis of right knee 03/27/2011  . Lumbago 03/27/2011  . HTN (hypertension) 03/27/2011  . HLD (hyperlipidemia) 03/27/2011  . Generalized anxiety disorder 03/27/2011  . Depression 03/27/2011  . Chronic steroid use 03/27/2011  . Crohn's disease ([place]) 03/27/2011  . OCD (obsessive compulsive disorder) 03/27/2011  .  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 Obstructive sleep apnea, adult 03/27/2011    Past Surgical History:  Procedure Laterality Date  . ABDOMINAL HYSTERECTOMY  02/13/2012   Procedure: HYSTERECTOMY ABDOMINAL;  Surgeon: Cyril Mourning, MD;  Location: [place] [facility];  Service: Gynecology;  Laterality: N/A;  . BRAVO [place] STUDY N/A 11/27/2013   Procedure: BRAVO [place];  Surgeon: Beryle Beams, MD;  [facility];  Service: Endoscopy;  Laterality: N/A;  . BRAVO [place] STUDY N/A 12/11/2013   Procedure: BRAVO [place];  Surgeon: Beryle Beams, MD;  [facility];  Service: Endoscopy;  Laterality: N/A;  . breast lumpectomy and reconstruction Bilateral 2011  . BREAST SURGERY     bilateral lumpectomy 4'11  . COLONOSCOPY N/A 09/25/2013   Procedure: COLONOSCOPY;  Surgeon: Beryle Beams, MD;  [facility];  Service: Endoscopy;  Laterality: N/A;  . COLONOSCOPY WITH PROPOFOL N/A 07/29/2015   Procedure: COLONOSCOPY WITH PROPOFOL;  Surgeon: Carol Ada, MD;  [facility];  Service: Endoscopy;  Laterality: N/A;  . CYSTOSCOPY  02/13/2012   Procedure: CYSTOSCOPY;  Surgeon: Franchot Gallo, MD;  Location: [facility] [place] [facility];  Service: Urology;  Laterality: N/A;  insertion of ureteral catheter , removed per Dr Diona Fanti   . ESOPHAGEAL MANOMETRY N/A 08/10/2013   Procedure: ESOPHAGEAL MANOMETRY (EM);  Surgeon: Beryle Beams, MD;  Location: [facility];  Service: Endoscopy;  Laterality: N/A;  . ESOPHAGOGASTRODUODENOSCOPY N/A 09/25/2013   Procedure: ESOPHAGOGASTRODUODENOSCOPY (EGD);  Surgeon: Beryle Beams, MD;  Location: [facility];  Service: Endoscopy;  Laterality: N/A;  . ESOPHAGOGASTRODUODENOSCOPY N/A 11/27/2013   Procedure: ESOPHAGOGASTRODUODENOSCOPY (EGD);  Surgeon: Beryle Beams, MD;  Location: [facility];  Service: Endoscopy;  Laterality: N/A;  . ESOPHAGOGASTRODUODENOSCOPY N/A 12/11/2013   Procedure: ESOPHAGOGASTRODUODENOSCOPY (EGD);  Surgeon: Beryle Beams, MD;  Location: [facility];  Service: Endoscopy;  Laterality: N/A;  . GROIN DISSECTION Left 10/09/2013   Procedure: INCISION AND DRAINAGE OF LEFT GROIN ABSCESS;  Surgeon: Shann Medal, MD;  [facility];  Service: General;  Laterality: Left;  . INCISION AND DRAINAGE ABSCESS N/A 08/26/2013   Procedure: INCISION AND DRAINAGE PERIRECTAL ABSCESS;  Surgeon: Jamesetta So, MD;  Location: [facility];  Service: General;  Laterality: N/A;  . KNEE SURGERY Left april 2003   arthroscopy  . LOWER EXTREMITY ARTERIAL DOPLLERS  05/06/2012   Bilat ABIs were attempted-not accessible due to calcified veins. Bilat TBIs demonstrated no obtainable flow through both great toes. Bilat PVR ankle regions demonstrated moderately abnormal pulsatile flow. Lft SFA demonstrated a 50-69% diameter reduction. Lft runoff demonstrated occlusive diseasewith reconstitution of flow noted distally.  Marland Kitchen [place] and 2011   last '11-[facility]  . PARATHYROIDECTOMY  02-20-2001  . SLEEVE GASTROPLASTY     for GERD, hiatal hernia  . TUBAL LIGATION  02-19-2000    OB History    Gravida Para Term Preterm AB Living             0   SAB TAB Ectopic Multiple Live Births                   Home Medications    Prior to Admission medications   Medication Sig Start Date End Date Taking? Authorizing Provider  ALPRAZolam Duanne Moron) 0.5 MG tablet Take 0.5 mg by mouth at bedtime.  02/14/16   Historical Provider, MD  calcitRIOL (ROCALTROL) 0.25 MCG capsule Take 1.75 mcg by mouth every morning. Patient states that she takes 6 total 05/05/13   Niel Hummer, NP  calcium carbonate (OS-CAL - DOSED IN MG OF ELEMENTAL CALCIUM) 1250 MG tablet Take 1 tablet by mouth 3 (three) times daily. 05/05/13   Niel Hummer, NP  cetirizine (ZYRTEC) 10 MG tablet Take 10 mg by mouth every evening. 05/05/13   Niel Hummer, NP  cyclobenzaprine (FLEXERIL) 10 MG tablet TAKE ONE TABLET BY MOUTH THREE TIMES DAILY AS NEEDED FOR MUSCLE SPASMS. 06/16/15   Susy Frizzle, MD   fluocinonide cream (LIDEX) 4.19 % Apply 1 application topically daily as needed (flare up as needed).    Historical Provider, MD  FLUoxetine (PROZAC) 20 MG capsule Take 60 mg by mouth daily.    Historical Provider, MD  hyoscyamine (LEVSIN/SL) 0.125 MG SL tablet Place 1 tablet (0.125 mg total) under the tongue every 4 (four) hours as needed. Patient not taking: Reported on 05/01/2016 07/31/15   April Palumbo, MD  LINZESS 145 MCG CAPS capsule TAKE ONE CAPSULE BY MOUTH EVERY DAY. Patient taking differently: TAKE ONE CAPSULE BY MOUTH EVERY DAY PRN CONSTIPATION 06/15/15   Susy Frizzle, MD  mycophenolate (MYFORTIC) 180 MG EC tablet Take 540 mg by mouth 2 (two) times daily. 05/05/13   Niel Hummer, NP  omeprazole (PRILOSEC) 40 MG capsule Take 40 mg by mouth 2 (two) times  daily.     Historical Provider, MD  ondansetron (ZOFRAN ODT) 8 MG disintegrating tablet 8mg  ODT q8 hours prn nausea 07/31/15   April Palumbo, MD  ondansetron (ZOFRAN) 8 MG tablet Take 8 mg by mouth 2 (two) times daily as needed for nausea or vomiting.    Historical Provider, MD  Oxycodone HCl 10 MG TABS Take 1 tablet (10 mg total) by mouth 3 (three) times daily as needed (pain). For pain 04/19/15   Susy Frizzle, MD  predniSONE (DELTASONE) 10 MG tablet Take 10 mg by mouth daily with breakfast.     Historical Provider, MD  promethazine (PHENERGAN) 25 MG tablet TAKE 1 TABLET BY MOUTH EVERY SIX HOURS AS NEEDED FOR NAUSEA/VOMITING. 12/21/15   Susy Frizzle, MD  promethazine (PHENERGAN) 25 MG tablet Take 25 mg by mouth every 6 (six) hours as needed for nausea or vomiting.     Historical Provider, MD  simvastatin (ZOCOR) 20 MG tablet Take 20 mg by mouth at bedtime.    Historical Provider, MD  sucralfate (CARAFATE) 1 GM/10ML suspension Take 10 mLs (1 g total) by mouth 4 (four) times daily -  with meals and at bedtime. Patient not taking: Reported on 05/01/2016 07/31/15   April Palumbo, MD  tacrolimus (PROGRAF) 1 MG capsule Take 3 mg by mouth 2  (two) times daily.    Historical Provider, MD  topiramate (TOPAMAX) 25 MG tablet Take 25 mg by mouth daily.  12/23/15   Historical Provider, MD  ZOLPIDEM TARTRATE ER PO Take 5 mg by mouth at bedtime.    Historical Provider, MD    Family History Family History  Problem Relation Age of Onset  . Diabetes Mother   . Hypertension Mother   . Diabetes Father   . Hypertension Father   . Cancer Father     Prostate cancer  . Obesity Sister   . Stroke Maternal Grandmother   . Heart attack Maternal Grandfather   . Polycystic ovary syndrome Sister     Social History Social History  Substance Use Topics  . Smoking status: Never Smoker  . Smokeless tobacco: Never Used  . Alcohol use No     Allergies   Barium-containing compounds; Dilaudid [hydromorphone hcl]; Hydromorphone; Ivp dye [iodinated diagnostic agents]; Shellfish allergy; Vancomycin; Adhesive [tape]; Infed [iron dextran]; Sulfa antibiotics; and Iopamidol   Review of Systems Review of Systems  All other systems reviewed and are negative.    Physical Exam Updated Vital Signs BP 126/73   Pulse 69   Temp 98.2 F (36.8 C) (Oral)   Resp 18   Ht 5\' 3"  (1.6 m)   Wt 81.6 kg   LMP 01/30/2012   SpO2 100%   BMI 31.89 kg/m   Physical Exam  Constitutional: She appears well-developed and well-nourished. No distress.  HENT:  Head: Normocephalic and atraumatic.  Right Ear: External ear normal.  Left Ear: External ear normal.  Eyes: Conjunctivae are normal. Right eye exhibits no discharge. Left eye exhibits no discharge. No scleral icterus.  Neck: Neck supple. No tracheal deviation present.  Cardiovascular: Normal rate.   Pulmonary/Chest: Effort normal. No stridor. No respiratory distress.  Abdominal: She exhibits no distension.  Musculoskeletal: She exhibits edema and tenderness.  Palpable superficial vein that is tender and blue in color along the medial aspect of her lower leg below the knee. No distal edema or erythema,  no proximal edema or erythema  Neurological: She is alert. Cranial nerve deficit: no gross deficits.  Skin: Skin is  warm and dry. No rash noted.  Psychiatric: She has a normal mood and affect.  Nursing note and vitals reviewed.    ED Treatments / Results   Procedures Procedures (including critical care time)   Initial Impression / Assessment and Plan / ED Course  I have reviewed the triage vital signs and the nursing notes.  Pertinent labs & imaging results that were available during my care of the patient were reviewed by me and considered in my medical decision making (see chart for details).  Clinical Course    Pt most likely has a superficial venous thrombosis, varicose vein.  Doubt DVT.  [place] is not available at this time of night to get a DVT study.  Will give a dose of lovenox.  Arrange for follow up test tomorrow.  Final Clinical Impressions(s) / ED Diagnoses   Final diagnoses:  Right leg pain    New Prescriptions New Prescriptions   No medications on file     Dorie Rank, MD 11/05/16 1902  Labs reviewed prior to dc.  No acute issues.  Stable compared to her baseline.   Dorie Rank, MD 11/05/16 [phone]

## 2016-11-05 NOTE — Discharge Instructions (Signed)
Follow-up tomorrow with the radiology department to have your vascular ultrasound test.

## 2016-11-05 NOTE — Progress Notes (Signed)
ANTICOAGULATION CONSULT NOTE - Initial Consult  Pharmacy Consult for Lovenox  Indication: R/O DVT  Allergies  Allergen Reactions  . Barium-Containing Compounds Shortness Of Breath  . Dilaudid [Hydromorphone Hcl] Anaphylaxis  . Hydromorphone Shortness Of Breath  . Ivp Dye [Iodinated Diagnostic Agents] Anaphylaxis  . Shellfish Allergy Shortness Of Breath and Itching    Causes red, patchy areas to form  . Vancomycin Anaphylaxis  . Adhesive [Tape] Other (See Comments)    Dermatitis   . Infed [Iron Dextran] Itching  . Sulfa Antibiotics Hives  . Iopamidol Itching and Rash    Patient Measurements: Height: 5\' 3"  (160 cm) Weight: 180 lb (81.6 kg) IBW/kg (Calculated) : 52.4   Vital Signs: Temp: 98.2 F (36.8 C) (11/13 1821) Temp Source: Oral (11/13 1821) BP: 126/73 (11/13 1821) Pulse Rate: 69 (11/13 1821)  Labs: No results for input(s): HGB, HCT, PLT, APTT, LABPROT, INR, HEPARINUNFRC, HEPRLOWMOCWT, CREATININE, CKTOTAL, CKMB, TROPONINI in the last 72 hours.  CrCl cannot be calculated (Patient's most recent lab result is older than the maximum 21 days allowed.).   Medical History: Past Medical History:  Diagnosis Date  . Anxiety    OCD  . Cancer [facility]) 2001   renal cell , breast cancer both breast 2011, cervical cancer 2013  . Chronic anemia   . Chronic pain   . Complication of anesthesia    hard to sedate dr hung aware  . Crohn's disease ([place])   . Fracture 05/01/2013   Right foot, in cast  . History of blood transfusion 2 years ago  . Hyperlipidemia   . Hypothyroidism    parathroid runs low  . Meningioma ([place])   . Panic disorder   . PONV (postoperative nausea and vomiting)   . Renal disorder    S/p '11-Kidney transplant-Dr. Florene Glen follows    Medications:   (Not in a hospital admission)  Assessment: Okay for Protocol, Lovenox x 1 for r/o DVT.  Per MD "Pt most likely has a superficial venous thrombosis, varicose vein.  Doubt DVT.  [place] is not available at this time  of night to get a DVT study.  Will give a dose of lovenox.  Arrange for follow up test tomorrow."  Baseline labs pending.  Goal of Therapy:  Anti-Xa level 0.6-1 units/ml 4hrs after LMWH dose given if clinically indicated. Monitor platelets by anticoagulation protocol: Yes   Plan:  Lovenox 1mg /kg SQ x 1 Monitor for signs and symptoms of bleeding.   Pricilla Larsson 11/05/2016,7:04 PM

## 2016-11-05 NOTE — ED Notes (Signed)
Pt insisted on leaving without  Waiting for labs to result. Pt states, she did not want to wait in the ED with "all these sick people." Dr Tomi Bamberger made aware.

## 2016-11-05 NOTE — ED Triage Notes (Signed)
Pt states she was sent by her PCP office for eval for possible blood clot. Pt isolates area of edema to her R medial leg just inferior to the knee joint.

## 2016-11-06 ENCOUNTER — Other Ambulatory Visit (HOSPITAL_COMMUNITY): Payer: Commercial Managed Care - HMO

## 2016-11-06 ENCOUNTER — Ambulatory Visit (HOSPITAL_COMMUNITY)
Admission: RE | Admit: 2016-11-06 | Discharge: 2016-11-06 | Disposition: A | Discharge status: Home / Self Care | Payer: Medicare HMO | Source: Ambulatory Visit | Attending: Emergency Medicine | Admitting: Emergency Medicine

## 2016-11-06 DIAGNOSIS — M7989 Other specified soft tissue disorders: Secondary | ICD-10-CM | POA: Diagnosis not present

## 2016-11-06 NOTE — ED Provider Notes (Signed)
Patient given result of doppler   Davonna Belling, MD 11/06/16 [phone]

## 2016-11-12 ENCOUNTER — Other Ambulatory Visit (HOSPITAL_COMMUNITY)
Admission: RE | Admit: 2016-11-12 | Discharge: 2016-11-12 | Disposition: A | Discharge status: Home / Self Care | Payer: Medicare HMO | Source: Ambulatory Visit | Attending: Nephrology | Admitting: Nephrology

## 2016-11-12 DIAGNOSIS — E875 Hyperkalemia: Secondary | ICD-10-CM | POA: Insufficient documentation

## 2016-11-12 LAB — RENAL FUNCTION PANEL
Albumin: 4.4 g/dL (ref 3.5–5.0)
Anion gap: 9 (ref 5–15)
BUN: 33 mg/dL — ABNORMAL HIGH (ref 6–20)
CO2: 24 mmol/L (ref 22–32)
Calcium: 8.7 mg/dL — ABNORMAL LOW (ref 8.9–10.3)
Chloride: 104 mmol/L (ref 101–111)
Creatinine, Ser: 1.63 mg/dL — ABNORMAL HIGH (ref 0.44–1.00)
GFR calc Af Amer: 44 mL/min — ABNORMAL LOW (ref >60)
GFR calc non Af Amer: 38 mL/min — ABNORMAL LOW (ref >60)
Glucose, Bld: 149 mg/dL — ABNORMAL HIGH (ref 65–99)
Phosphorus: 3.4 mg/dL (ref 2.5–4.6)
Potassium: 3.9 mmol/L (ref 3.5–5.1)
Sodium: 137 mmol/L (ref 135–145)

## 2016-11-28 ENCOUNTER — Encounter (HOSPITAL_COMMUNITY): Payer: Self-pay | Admitting: *Deleted

## 2016-11-28 ENCOUNTER — Emergency Department (HOSPITAL_COMMUNITY)
Admission: EM | Admit: 2016-11-28 | Discharge: 2016-11-29 | Disposition: A | Discharge status: Home / Self Care | Payer: Managed Care, Other (non HMO) | Attending: Emergency Medicine | Admitting: Emergency Medicine

## 2016-11-28 DIAGNOSIS — E1122 Type 2 diabetes mellitus with diabetic chronic kidney disease: Secondary | ICD-10-CM | POA: Diagnosis not present

## 2016-11-28 DIAGNOSIS — R0789 Other chest pain: Secondary | ICD-10-CM | POA: Diagnosis present

## 2016-11-28 DIAGNOSIS — N186 End stage renal disease: Secondary | ICD-10-CM | POA: Diagnosis not present

## 2016-11-28 DIAGNOSIS — Z992 Dependence on renal dialysis: Secondary | ICD-10-CM | POA: Diagnosis not present

## 2016-11-28 DIAGNOSIS — I120 Hypertensive chronic kidney disease with stage 5 chronic kidney disease or end stage renal disease: Secondary | ICD-10-CM | POA: Insufficient documentation

## 2016-11-28 DIAGNOSIS — Z79899 Other long term (current) drug therapy: Secondary | ICD-10-CM | POA: Diagnosis not present

## 2016-11-28 DIAGNOSIS — R252 Cramp and spasm: Secondary | ICD-10-CM

## 2016-11-28 DIAGNOSIS — E039 Hypothyroidism, unspecified: Secondary | ICD-10-CM | POA: Diagnosis not present

## 2016-11-28 LAB — COMPREHENSIVE METABOLIC PANEL
ALT: 12 U/L — ABNORMAL LOW (ref 14–54)
AST: 13 U/L — ABNORMAL LOW (ref 15–41)
Albumin: 4.1 g/dL (ref 3.5–5.0)
Alkaline Phosphatase: 52 U/L (ref 38–126)
Anion gap: 9 (ref 5–15)
BUN: 26 mg/dL — ABNORMAL HIGH (ref 6–20)
CO2: 24 mmol/L (ref 22–32)
Calcium: 8.3 mg/dL — ABNORMAL LOW (ref 8.9–10.3)
Chloride: 102 mmol/L (ref 101–111)
Creatinine, Ser: 1.5 mg/dL — ABNORMAL HIGH (ref 0.44–1.00)
GFR calc Af Amer: 49 mL/min — ABNORMAL LOW (ref >60)
GFR calc non Af Amer: 42 mL/min — ABNORMAL LOW (ref >60)
Glucose, Bld: 161 mg/dL — ABNORMAL HIGH (ref 65–99)
Potassium: 4.8 mmol/L (ref 3.5–5.1)
Sodium: 135 mmol/L (ref 135–145)
Total Bilirubin: 0.4 mg/dL (ref 0.3–1.2)
Total Protein: 7.3 g/dL (ref 6.5–8.1)

## 2016-11-28 LAB — CBC WITH DIFFERENTIAL/PLATELET
Basophils Absolute: 0 10*3/uL (ref 0.0–0.1)
Basophils Relative: 0 %
Eosinophils Absolute: 0 10*3/uL (ref 0.0–0.7)
Eosinophils Relative: 0 %
HCT: 35.8 % — ABNORMAL LOW (ref 36.0–46.0)
Hemoglobin: 11.1 g/dL — ABNORMAL LOW (ref 12.0–15.0)
Lymphocytes Relative: 12 %
Lymphs Abs: 1.1 10*3/uL (ref 0.7–4.0)
MCH: 27.2 pg (ref 26.0–34.0)
MCHC: 31 g/dL (ref 30.0–36.0)
MCV: 87.7 fL (ref 78.0–100.0)
Monocytes Absolute: 0.3 10*3/uL (ref 0.1–1.0)
Monocytes Relative: 3 %
Neutro Abs: 7.9 10*3/uL — ABNORMAL HIGH (ref 1.7–7.7)
Neutrophils Relative %: 85 %
Platelets: 221 10*3/uL (ref 150–400)
RBC: 4.08 MIL/uL (ref 3.87–5.11)
RDW: 13.9 % (ref 11.5–15.5)
WBC: 9.3 10*3/uL (ref 4.0–10.5)

## 2016-11-28 LAB — TROPONIN I: Troponin I: 0.03 ng/mL (ref <0.03)

## 2016-11-28 LAB — PHOSPHORUS: Phosphorus: 3.9 mg/dL (ref 2.5–4.6)

## 2016-11-28 LAB — MAGNESIUM: Magnesium: 1.8 mg/dL (ref 1.7–2.4)

## 2016-11-28 MED ORDER — OXYCODONE HCL 5 MG PO TABS
10.0000 mg | ORAL_TABLET | Freq: Once | ORAL | Status: AC
  Administered 2016-11-28: 10 mg via ORAL
  Filled 2016-11-28: qty 2

## 2016-11-28 MED ORDER — DIPHENHYDRAMINE HCL 25 MG PO CAPS
25.0000 mg | ORAL_CAPSULE | Freq: Once | ORAL | Status: DC
  Filled 2016-11-28: qty 1

## 2016-11-28 MED ORDER — CALCIUM GLUCONATE 10 % IV SOLN
1.0000 g | Freq: Once | INTRAVENOUS | Status: AC
  Administered 2016-11-28: 1 g via INTRAVENOUS
  Filled 2016-11-28: qty 10

## 2016-11-28 MED ORDER — MORPHINE SULFATE (PF) 4 MG/ML IV SOLN
4.0000 mg | Freq: Once | INTRAVENOUS | Status: AC
  Administered 2016-11-28: 4 mg via INTRAVENOUS
  Filled 2016-11-28: qty 1

## 2016-11-28 MED ORDER — DIPHENHYDRAMINE HCL 50 MG/ML IJ SOLN
25.0000 mg | Freq: Once | INTRAMUSCULAR | Status: AC
  Administered 2016-11-28: 25 mg via INTRAVENOUS
  Filled 2016-11-28: qty 1

## 2016-11-28 NOTE — ED Triage Notes (Signed)
Pt states she feels like her electrolytes are out of wack. Pt c/o cupper body pain, soreness, and cramping. Pt describes and "electrical" feeling in her legs. Pt also states she feels like she is "eating pennies." Pt states this is a chronic issue.

## 2016-11-28 NOTE — ED Provider Notes (Signed)
[facility] DEPT Provider Note   CSN: [id number] Arrival date & time: 11/28/16  1952  By signing my name below, I, [name], attest that this documentation has been prepared under the direction and in the presence of Forde Dandy, MD. Electronically Signed: Reola [name], ED Scribe. 11/28/16. 8:17 PM.  History   Chief Complaint Chief Complaint  Patient presents with  . Chest Pain   The history is provided by the patient and medical records. No language interpreter was used.    HPI Comments: [name] is a 40 y.o. female with a h/o Crohn's  Disease, renal transplant, and hyperlipidemia, who presents to the Emergency Department complaining of acute on chronic, diffuse cramping generalized pain. This has been ongoing for one week. She notes associated dysgeusia, described as a metallic-like taste in her mouth, intermittent blurry vision, and intermittent cramping pains to mostly her upper body, hands, feet, and occasionally in her bilateral knees. States cramping chest wall pain as well w/o shortness of breath or syncope/near syncope. Similar to when she has had hypocalcemia in the past associated with her hypoparathyroidism.   Pt additionally reports that she has chronic-type abdominal and chest pain, but her pain is exacerbated from her baseline. She is currently followed by a PCP who has been monitoring her electrolyte levels. Her daily calcium supplements were recently increased to 1500mg  daily. Has not taken medications at home for pain. She is s/p prior renal transplant which was performed at [facility] in 2011. Pt has had normal food and fluid intake since the onset of her symptoms. She denies vomiting, diarrhea, fever, cough, SOB, leg swelling, chills, congestion, rhinorrhea, or any other associated symptoms.   PCP: Selinda Orion   Past Medical History:  Diagnosis Date  . Anxiety    OCD  . Cancer [facility]) 2001   renal cell , breast cancer both breast  2011, cervical cancer 2013  . Chronic anemia   . Chronic pain   . Complication of anesthesia    hard to sedate dr hung aware  . Crohn's disease ([place])   . Fracture 05/01/2013   Right foot, in cast  . History of blood transfusion 2 years ago  . Hyperlipidemia   . Hypothyroidism    parathroid runs low  . Meningioma ([place])   . Panic disorder   . PONV (postoperative nausea and vomiting)   . Renal disorder    S/p '11-Kidney transplant-Dr. Florene Glen follows   Patient Active Problem List   Diagnosis Date Noted  . UTI (lower urinary tract infection) 11/20/2013  . CKD (chronic kidney disease) stage 3, GFR 30-59 ml/min 08/25/2013  . Perirectal abscess 08/24/2013  . ESRD (end stage renal disease) ([place]) 03/27/2011  . Kidney transplant status, living unrelated donor 03/27/2011  . Anemia 03/27/2011  . Type 2 diabetes mellitus ([place]) 03/27/2011  . Osteoarthritis of right knee 03/27/2011  . Lumbago 03/27/2011  . HTN (hypertension) 03/27/2011  . HLD (hyperlipidemia) 03/27/2011  . Generalized anxiety disorder 03/27/2011  . Depression 03/27/2011  . Chronic steroid use 03/27/2011  . Crohn's disease ([place]) 03/27/2011  . OCD (obsessive compulsive disorder) 03/27/2011  . 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 Obstructive sleep apnea, adult 03/27/2011   Past Surgical History:  Procedure Laterality Date  . ABDOMINAL HYSTERECTOMY  02/13/2012   Procedure: HYSTERECTOMY ABDOMINAL;  Surgeon: Cyril Mourning, MD;  Location: [facility];  Service: Gynecology;  Laterality: N/A;  . BRAVO [place] STUDY N/A 11/27/2013   Procedure: BRAVO [place];  Surgeon: Beryle Beams, MD;  Location: [facility];  Service: Endoscopy;  Laterality: N/A;  . BRAVO [place] STUDY N/A 12/11/2013   Procedure: BRAVO [place];  Surgeon: Beryle Beams, MD;  Location: [facility];  Service: Endoscopy;  Laterality: N/A;  . breast lumpectomy and reconstruction Bilateral 2011  . BREAST SURGERY     bilateral lumpectomy 4'11  . COLONOSCOPY N/A  09/25/2013   Procedure: COLONOSCOPY;  Surgeon: Beryle Beams, MD;  Location: [facility];  Service: Endoscopy;  Laterality: N/A;  . COLONOSCOPY WITH PROPOFOL N/A 07/29/2015   Procedure: COLONOSCOPY WITH PROPOFOL;  Surgeon: Carol Ada, MD;  Location: [facility];  Service: Endoscopy;  Laterality: N/A;  . CYSTOSCOPY  02/13/2012   Procedure: CYSTOSCOPY;  Surgeon: Franchot Gallo, MD;  Location: [facility];  Service: Urology;  Laterality: N/A;  insertion of ureteral catheter , removed per Dr Diona Fanti   . ESOPHAGEAL MANOMETRY N/A 08/10/2013   Procedure: ESOPHAGEAL MANOMETRY (EM);  Surgeon: Beryle Beams, MD;  Location: [facility];  Service: Endoscopy;  Laterality: N/A;  . ESOPHAGOGASTRODUODENOSCOPY N/A 09/25/2013   Procedure: ESOPHAGOGASTRODUODENOSCOPY (EGD);  Surgeon: Beryle Beams, MD;  Location: [facility];  Service: Endoscopy;  Laterality: N/A;  . ESOPHAGOGASTRODUODENOSCOPY N/A 11/27/2013   Procedure: ESOPHAGOGASTRODUODENOSCOPY (EGD);  Surgeon: Beryle Beams, MD;  Location: [facility];  Service: Endoscopy;  Laterality: N/A;  . ESOPHAGOGASTRODUODENOSCOPY N/A 12/11/2013   Procedure: ESOPHAGOGASTRODUODENOSCOPY (EGD);  Surgeon: Beryle Beams, MD;  Location: [facility];  Service: Endoscopy;  Laterality: N/A;  . GROIN DISSECTION Left 10/09/2013   Procedure: INCISION AND DRAINAGE OF LEFT GROIN ABSCESS;  Surgeon: Shann Medal, MD;  Location: [facility];  Service: General;  Laterality: Left;  . INCISION AND DRAINAGE ABSCESS N/A 08/26/2013   Procedure: INCISION AND DRAINAGE PERIRECTAL ABSCESS;  Surgeon: Jamesetta So, MD;  Location: [facility];  Service: General;  Laterality: N/A;  . KNEE SURGERY Left april 2003   arthroscopy  . LOWER EXTREMITY ARTERIAL DOPLLERS  05/06/2012   Bilat ABIs were attempted-not accessible due to calcified veins. Bilat TBIs demonstrated no obtainable flow through both great toes. Bilat PVR ankle regions demonstrated moderately abnormal pulsatile flow. Lft SFA demonstrated a  50-69% diameter reduction. Lft runoff demonstrated occlusive diseasewith reconstitution of flow noted distally.  Marland Kitchen [place] and 2011   last '11-[facility]  . PARATHYROIDECTOMY  02-20-2001  . SLEEVE GASTROPLASTY     for GERD, hiatal hernia  . TUBAL LIGATION  02-19-2000   OB History    Gravida Para Term Preterm AB Living             0   SAB TAB Ectopic Multiple Live Births                 Home Medications    Prior to Admission medications   Medication Sig Start Date End Date Taking? Authorizing Provider  acyclovir cream (ZOVIRAX) 5 % Use as directed 5 times daily as needed 11/06/16  Yes Historical Provider, MD  ALPRAZolam (XANAX) 0.5 MG tablet Take 0.25-0.5 mg by mouth 3 (three) times daily as needed for anxiety or sleep.  02/14/16  Yes Historical Provider, MD  atorvastatin (LIPITOR) 20 MG tablet TAKE ONE TABLET BY MOUTH NIGHTLY. 10/18/16  Yes Historical Provider, MD  calcitRIOL (ROCALTROL) 0.25 MCG capsule Take 1.75 mcg by mouth every morning. Patient states that she takes 6 total 05/05/13  Yes Niel Hummer, NP  calcium carbonate (OS-CAL - DOSED IN MG OF ELEMENTAL CALCIUM) 1250 MG  tablet Take 5,000 mg by mouth 3 (three) times daily.  05/05/13  Yes Niel Hummer, NP  cetirizine (ZYRTEC) 10 MG tablet Take 10 mg by mouth every evening. 05/05/13  Yes Niel Hummer, NP  cyclobenzaprine (FLEXERIL) 10 MG tablet TAKE ONE TABLET BY MOUTH THREE TIMES DAILY AS NEEDED FOR MUSCLE SPASMS. 06/16/15  Yes Susy Frizzle, MD  fluocinonide cream (LIDEX) 3.76 % Apply 1 application topically daily as needed (flare up as needed).   Yes Historical Provider, MD  FLUoxetine (PROZAC) 20 MG capsule Take 60 mg by mouth daily.   Yes Historical Provider, MD  fluticasone (FLONASE) 50 MCG/ACT nasal spray Place 2 sprays into both nostrils daily as needed for allergies or rhinitis.   Yes Historical Provider, MD  hydrOXYzine (ATARAX/VISTARIL) 50 MG tablet Take 50 mg by mouth at bedtime.    Yes  Historical Provider, MD  LINZESS 145 MCG CAPS capsule TAKE ONE CAPSULE BY MOUTH EVERY DAY. Patient taking differently: TAKE ONE CAPSULE BY MOUTH EVERY DAY PRN CONSTIPATION 06/15/15  Yes Susy Frizzle, MD  mycophenolate (MYFORTIC) 180 MG EC tablet Take 540 mg by mouth 2 (two) times daily. 05/05/13  Yes Niel Hummer, NP  omeprazole (PRILOSEC) 40 MG capsule Take 40 mg by mouth 2 (two) times daily.    Yes Historical Provider, MD  Oxycodone HCl 10 MG TABS Take 1 tablet (10 mg total) by mouth 3 (three) times daily as needed (pain). For pain 04/19/15  Yes Susy Frizzle, MD  predniSONE (DELTASONE) 10 MG tablet Take 10 mg by mouth daily with breakfast.    Yes Historical Provider, MD  simvastatin (ZOCOR) 20 MG tablet Take 20 mg by mouth at bedtime.   Yes Historical Provider, MD  tacrolimus (PROGRAF) 1 MG capsule Take 3 mg by mouth 2 (two) times daily.   Yes Historical Provider, MD  Vitamin D, Ergocalciferol, (DRISDOL) 50000 units CAPS capsule Take 50,000 Units by mouth every Monday.    Yes Historical Provider, MD  hyoscyamine (LEVSIN/SL) 0.125 MG SL tablet Place 1 tablet (0.125 mg total) under the tongue every 4 (four) hours as needed. Patient not taking: Reported on 05/01/2016 07/31/15   April Palumbo, MD  oxyCODONE-acetaminophen (PERCOCET/ROXICET) 5-325 MG tablet Take 1 tablet by mouth every 4 (four) hours as needed for severe pain. 11/29/16   Forde Dandy, MD   Family History Family History  Problem Relation Age of Onset  . Diabetes Mother   . Hypertension Mother   . Diabetes Father   . Hypertension Father   . Cancer Father     Prostate cancer  . Obesity Sister   . Stroke Maternal Grandmother   . Heart attack Maternal Grandfather   . Polycystic ovary syndrome Sister    Social History Social History  Substance Use Topics  . Smoking status: Never Smoker  . Smokeless tobacco: Never Used  . Alcohol use No   Allergies   Barium-containing compounds; Dilaudid [hydromorphone hcl]; Hydromorphone;  Ivp dye [iodinated diagnostic agents]; Shellfish allergy; Vancomycin; Adhesive [tape]; Infed [iron dextran]; Sulfa antibiotics; and Iopamidol  Review of Systems Review of Systems  10/14 systems reviewed and are negative other than those stated in the HPI Physical Exam Updated Vital Signs BP 139/98 (BP Location: Left Arm)   Pulse 63   Temp 98.2 F (36.8 C) (Oral)   Resp 16   Ht 5\' 3"  (1.6 m)   Wt 180 lb (81.6 kg)   LMP 01/30/2012   SpO2 100%   BMI  31.89 kg/m   Physical Exam  Physical Exam  Nursing note and vitals reviewed. Constitutional: Well developed, well nourished, non-toxic, and in no acute distress Head: Normocephalic and atraumatic.  Mouth/Throat: Oropharynx is clear and moist.  Neck: Normal range of motion. Neck supple.  Cardiovascular: Normal rate and regular rhythm.   Pulmonary/Chest: Effort normal and breath sounds normal.  Abdominal: Soft. There is no tenderness. There is no rebound and no guarding.  Musculoskeletal: Normal range of motion.  Neurological: Alert, no facial droop, fluent speech, moves all extremities symmetrically Skin: Skin is warm and dry.  Psychiatric: Cooperative  ED Treatments / Results  DIAGNOSTIC STUDIES: Oxygen Saturation is 100% on RA, normal by my interpretation.   COORDINATION OF CARE: 8:07 PM-Discussed next steps with pt. Pt verbalized understanding and is agreeable with the plan.   Labs (all labs ordered are listed, but only abnormal results are displayed) Labs Reviewed  CBC WITH DIFFERENTIAL/PLATELET - Abnormal; Notable for the following:       Result Value   Hemoglobin 11.1 (*)    HCT 35.8 (*)    Neutro Abs 7.9 (*)    All other components within normal limits  COMPREHENSIVE METABOLIC PANEL - Abnormal; Notable for the following:    Glucose, Bld 161 (*)    BUN 26 (*)    Creatinine, Ser 1.50 (*)    Calcium 8.3 (*)    AST 13 (*)    ALT 12 (*)    GFR calc non Af Amer 42 (*)    GFR calc Af Amer 49 (*)    All other  components within normal limits  TROPONIN I - Abnormal; Notable for the following:    Troponin I 0.03 (*)    All other components within normal limits  TROPONIN I - Abnormal; Notable for the following:    Troponin I 0.03 (*)    All other components within normal limits  MAGNESIUM  PHOSPHORUS    EKG  EKG Interpretation  Date/Time:  Wednesday November 28 2016 20:16:08 EST Ventricular Rate:  77 PR Interval:    QRS Duration: 75 QT Interval:  421 QTC Calculation: 480 R Axis:   25 Text Interpretation:  Sinus rhythm Probable left atrial enlargement Low voltage, extremity and precordial leads no acute changes  Confirmed by Samyrah Bruster MD, Hinton Dyer ([id number]) on 11/28/2016 9:25:42 PM      Radiology No results found.  Procedures Procedures   Medications Ordered in ED Medications  calcium gluconate 1 g in sodium chloride 0.9 % 100 mL IVPB (0 g Intravenous Stopped 11/28/16 2346)  oxyCODONE (Oxy IR/ROXICODONE) immediate release tablet 10 mg (10 mg Oral Given 11/28/16 2254)  diphenhydrAMINE (BENADRYL) injection 25 mg (25 mg Intravenous Given 11/28/16 2339)  morphine 4 MG/ML injection 4 mg (4 mg Intravenous Given 11/28/16 2339)    Initial Impression / Assessment and Plan / ED Course  I have reviewed the triage vital signs and the nursing notes.  Pertinent labs & imaging results that were available during my care of the patient were reviewed by me and considered in my medical decision making (see chart for details).  Clinical Course    Presenting with diffuse generalized pain in setting of ongoing treatment for hypocalcemia.  She is non-toxic with stable vital signs. Calcium 8.3 here, reportedly in the 7s yesterday per patient on scheduled blood work by her PCP. Magnesium and potassium normal. Baseline CKD noted. EKG w/o significant QTc changes, and no other ischemic changes or stigmata of arrhythmia. Troponin was measured  and 0.03. Her pain does not seem consistent with ACS, given ongoing since 1 week and  consistent with pain she has had w/ hypocalcemia along with generalized pain all over her body. Serial troponin shows no changes, and noted to have stable CKD. Heart score of 2. She is given calcium gluconate and pain control. Feels improved. Stable for discharge for close outpatient management by PCP. Strict return and follow-up instructions reviewed. She expressed understanding of all discharge instructions and felt comfortable with the plan of care.    Angiocath insertion Performed by: Forde Dandy  Consent: Verbal consent obtained. Risks and benefits: risks, benefits and alternatives were discussed Time out: Immediately prior to procedure a "time out" was called to verify the correct patient, procedure, equipment, support staff and site/side marked as required.  Preparation: Patient was prepped and draped in the usual sterile fashion.  Vein Location: left forearm  Ultrasound Guided  Gauge: 20G  Normal blood return and flush without difficulty Patient tolerance: Patient tolerated the procedure well with no immediate complications.     Final Clinical Impressions(s) / ED Diagnoses   Final diagnoses:  Hypocalcemia  Muscle cramping   New Prescriptions New Prescriptions   OXYCODONE-ACETAMINOPHEN (PERCOCET/ROXICET) 5-325 MG TABLET    Take 1 tablet by mouth every 4 (four) hours as needed for severe pain.   I personally performed the services described in this documentation, which was scribed in my presence. The recorded information has been reviewed and is accurate.    Forde Dandy, MD 11/29/16 [phone]

## 2016-11-28 NOTE — ED Notes (Signed)
CRITICAL VALUE ALERT  Critical value received:  Troponin = 0.03  Date of notification:  11/28/16  Time of notification:  2223  Critical value read back:Yes.    Nurse who received alert:  Rosealee Albee  MD notified (1st page):  Oleta Mouse  Time of first page:  2224  MD notified (2nd page):  Time of second page:  Responding MD:  Oleta Mouse  Time MD responded:  2225

## 2016-11-29 LAB — TROPONIN I: Troponin I: 0.03 ng/mL (ref <0.03)

## 2016-11-29 MED ORDER — OXYCODONE-ACETAMINOPHEN 5-325 MG PO TABS: 1.0000 | ORAL_TABLET | ORAL | 0 refills | Status: DC | PRN

## 2016-11-29 NOTE — Discharge Instructions (Signed)
Please call your primary care doctor tomorrow.  Return for worsening symptoms, including passing out, different or worsening chest pain or generalized pain, or any other symptoms concerning to you.

## 2016-11-30 ENCOUNTER — Emergency Department (HOSPITAL_COMMUNITY)
Admission: EM | Admit: 2016-11-30 | Discharge: 2016-11-30 | Disposition: A | Discharge status: Home / Self Care | Payer: Managed Care, Other (non HMO) | Attending: Emergency Medicine | Admitting: Emergency Medicine

## 2016-11-30 ENCOUNTER — Emergency Department (HOSPITAL_COMMUNITY): Payer: Managed Care, Other (non HMO)

## 2016-11-30 ENCOUNTER — Encounter (HOSPITAL_COMMUNITY): Payer: Self-pay | Admitting: *Deleted

## 2016-11-30 DIAGNOSIS — Z79899 Other long term (current) drug therapy: Secondary | ICD-10-CM | POA: Insufficient documentation

## 2016-11-30 DIAGNOSIS — J189 Pneumonia, unspecified organism: Secondary | ICD-10-CM | POA: Diagnosis not present

## 2016-11-30 DIAGNOSIS — R509 Fever, unspecified: Secondary | ICD-10-CM | POA: Diagnosis present

## 2016-11-30 DIAGNOSIS — N186 End stage renal disease: Secondary | ICD-10-CM | POA: Insufficient documentation

## 2016-11-30 DIAGNOSIS — Z992 Dependence on renal dialysis: Secondary | ICD-10-CM | POA: Insufficient documentation

## 2016-11-30 DIAGNOSIS — E039 Hypothyroidism, unspecified: Secondary | ICD-10-CM | POA: Insufficient documentation

## 2016-11-30 DIAGNOSIS — I120 Hypertensive chronic kidney disease with stage 5 chronic kidney disease or end stage renal disease: Secondary | ICD-10-CM | POA: Diagnosis not present

## 2016-11-30 DIAGNOSIS — E1122 Type 2 diabetes mellitus with diabetic chronic kidney disease: Secondary | ICD-10-CM | POA: Diagnosis not present

## 2016-11-30 LAB — CBC WITH DIFFERENTIAL/PLATELET
Basophils Absolute: 0 10*3/uL (ref 0.0–0.1)
Basophils Relative: 0 %
Eosinophils Absolute: 0 10*3/uL (ref 0.0–0.7)
Eosinophils Relative: 0 %
HCT: 33.5 % — ABNORMAL LOW (ref 36.0–46.0)
Hemoglobin: 10.7 g/dL — ABNORMAL LOW (ref 12.0–15.0)
Lymphocytes Relative: 12 %
Lymphs Abs: 1.5 10*3/uL (ref 0.7–4.0)
MCH: 27.6 pg (ref 26.0–34.0)
MCHC: 31.9 g/dL (ref 30.0–36.0)
MCV: 86.6 fL (ref 78.0–100.0)
Monocytes Absolute: 0.7 10*3/uL (ref 0.1–1.0)
Monocytes Relative: 6 %
Neutro Abs: 10.2 10*3/uL — ABNORMAL HIGH (ref 1.7–7.7)
Neutrophils Relative %: 82 %
Platelets: 208 10*3/uL (ref 150–400)
RBC: 3.87 MIL/uL (ref 3.87–5.11)
RDW: 13.7 % (ref 11.5–15.5)
WBC: 12.5 10*3/uL — ABNORMAL HIGH (ref 4.0–10.5)

## 2016-11-30 LAB — URINALYSIS, ROUTINE W REFLEX MICROSCOPIC
Bilirubin Urine: NEGATIVE
Glucose, UA: NEGATIVE mg/dL
Hgb urine dipstick: NEGATIVE
Ketones, ur: NEGATIVE mg/dL
Leukocytes, UA: NEGATIVE
Nitrite: NEGATIVE
Protein, ur: NEGATIVE mg/dL
Specific Gravity, Urine: 1.025 (ref 1.005–1.030)
pH: 6 (ref 5.0–8.0)

## 2016-11-30 LAB — COMPREHENSIVE METABOLIC PANEL
ALT: 12 U/L — ABNORMAL LOW (ref 14–54)
AST: 16 U/L (ref 15–41)
Albumin: 3.9 g/dL (ref 3.5–5.0)
Alkaline Phosphatase: 47 U/L (ref 38–126)
Anion gap: 10 (ref 5–15)
BUN: 24 mg/dL — ABNORMAL HIGH (ref 6–20)
CO2: 25 mmol/L (ref 22–32)
Calcium: 8.7 mg/dL — ABNORMAL LOW (ref 8.9–10.3)
Chloride: 103 mmol/L (ref 101–111)
Creatinine, Ser: 1.53 mg/dL — ABNORMAL HIGH (ref 0.44–1.00)
GFR calc Af Amer: 47 mL/min — ABNORMAL LOW (ref >60)
GFR calc non Af Amer: 41 mL/min — ABNORMAL LOW (ref >60)
Glucose, Bld: 107 mg/dL — ABNORMAL HIGH (ref 65–99)
Potassium: 3.7 mmol/L (ref 3.5–5.1)
Sodium: 138 mmol/L (ref 135–145)
Total Bilirubin: 0.7 mg/dL (ref 0.3–1.2)
Total Protein: 6.9 g/dL (ref 6.5–8.1)

## 2016-11-30 LAB — I-STAT CHEM 8, ED
BUN: 23 mg/dL — ABNORMAL HIGH (ref 6–20)
Calcium, Ion: 1.08 mmol/L — ABNORMAL LOW (ref 1.15–1.40)
Chloride: 102 mmol/L (ref 101–111)
Creatinine, Ser: 1.6 mg/dL — ABNORMAL HIGH (ref 0.44–1.00)
Glucose, Bld: 103 mg/dL — ABNORMAL HIGH (ref 65–99)
HCT: 34 % — ABNORMAL LOW (ref 36.0–46.0)
Hemoglobin: 11.6 g/dL — ABNORMAL LOW (ref 12.0–15.0)
Potassium: 3.7 mmol/L (ref 3.5–5.1)
Sodium: 140 mmol/L (ref 135–145)
TCO2: 25 mmol/L (ref 0–100)

## 2016-11-30 LAB — I-STAT CG4 LACTIC ACID, ED: Lactic Acid, Venous: 1.99 mmol/L (ref 0.5–1.9)

## 2016-11-30 LAB — I-STAT BETA HCG BLOOD, ED (MC, WL, AP ONLY): I-stat hCG, quantitative: <5 m[IU]/mL (ref <5)

## 2016-11-30 MED ORDER — PIPERACILLIN-TAZOBACTAM 3.375 G IVPB
3.3750 g | Freq: Three times a day (TID) | INTRAVENOUS | Status: DC
  Administered 2016-11-30: 3.375 g via INTRAVENOUS
  Filled 2016-11-30: qty 50

## 2016-11-30 MED ORDER — SODIUM CHLORIDE 0.9 % IV BOLUS (SEPSIS)
1000.0000 mL | Freq: Once | INTRAVENOUS | Status: AC
  Administered 2016-11-30: 1000 mL via INTRAVENOUS

## 2016-11-30 MED ORDER — AZITHROMYCIN 250 MG PO TABS: ORAL_TABLET | ORAL | 0 refills | Status: DC

## 2016-11-30 MED ORDER — MORPHINE SULFATE (PF) 4 MG/ML IV SOLN
4.0000 mg | Freq: Once | INTRAVENOUS | Status: AC
  Administered 2016-11-30: 4 mg via INTRAVENOUS
  Filled 2016-11-30: qty 1

## 2016-11-30 MED ORDER — PIPERACILLIN-TAZOBACTAM 3.375 G IVPB 30 MIN: 3.3750 g | Freq: Once | INTRAVENOUS | Status: DC

## 2016-11-30 MED ORDER — DEXTROSE 5 % IV SOLN
1.0000 g | Freq: Once | INTRAVENOUS | Status: AC
  Administered 2016-11-30: 1 g via INTRAVENOUS
  Filled 2016-11-30: qty 10

## 2016-11-30 MED ORDER — FENTANYL CITRATE (PF) 100 MCG/2ML IJ SOLN
50.0000 ug | Freq: Once | INTRAMUSCULAR | Status: AC
  Administered 2016-11-30: 50 ug via INTRAVENOUS
  Filled 2016-11-30: qty 2

## 2016-11-30 MED ORDER — DIPHENHYDRAMINE HCL 50 MG/ML IJ SOLN
25.0000 mg | Freq: Once | INTRAMUSCULAR | Status: AC
  Administered 2016-11-30: 25 mg via INTRAVENOUS
  Filled 2016-11-30: qty 1

## 2016-11-30 MED ORDER — DEXTROSE 5 % IV SOLN
500.0000 mg | Freq: Once | INTRAVENOUS | Status: AC
  Administered 2016-11-30: 500 mg via INTRAVENOUS
  Filled 2016-11-30: qty 500

## 2016-11-30 MED ORDER — SODIUM CHLORIDE 0.9 % IV BOLUS (SEPSIS): 500.0000 mL | Freq: Once | INTRAVENOUS | Status: DC

## 2016-11-30 NOTE — ED Notes (Signed)
Pt very difficult to obtain IV.

## 2016-11-30 NOTE — Progress Notes (Signed)
Pharmacy Antibiotic Note  [name] is a 40 y.o. female admitted on 11/30/2016 with r/o sepsis. Marland Kitchen  Pharmacy has been consulted for Zosyn dosing in this renal transplant pt.  Plan: Zosyn 3.375 gram IV q8h. (4 hour infusion rate)   Height: 5\' 3"  (160 cm) Weight: 177 lb (80.3 kg) IBW/kg (Calculated) : 52.4  Temp (24hrs), Avg:101.3 F (38.5 C), Min:101.3 F (38.5 C), Max:101.3 F (38.5 C)   Recent Labs Lab 11/28/16 2051  WBC 9.3  CREATININE 1.50*    Estimated Creatinine Clearance: 49.1 mL/min (by C-G formula based on SCr of 1.5 mg/dL (H)).    Allergies  Allergen Reactions  . Barium-Containing Compounds Shortness Of Breath  . Dilaudid [Hydromorphone Hcl] Anaphylaxis  . Hydromorphone Shortness Of Breath  . Ivp Dye [Iodinated Diagnostic Agents] Anaphylaxis  . Shellfish Allergy Shortness Of Breath and Itching    Causes red, patchy areas to form  . Vancomycin Anaphylaxis  . Adhesive [Tape] Other (See Comments)    Dermatitis   . Infed [Iron Dextran] Itching  . Sulfa Antibiotics Hives  . Iopamidol Itching and Rash    Antimicrobials this admission:    Dose adjustments this admission:   Microbiology results:  BCx:  UCx:  Sputum:   MRSA PCR:   Thank you for allowing pharmacy to be a part of this patient's care.  [place], [place] 11/30/2016 4:09 AM

## 2016-11-30 NOTE — ED Triage Notes (Signed)
Pt arrived by EMS complaining of left arm pain from IV last night & chills. Per EMS pt was temp was 100.

## 2016-11-30 NOTE — ED Notes (Signed)
Pt says she took 1000 mg tylenol at 0200. Pt ambulated to restroom & returned to the room w/o complications.

## 2016-11-30 NOTE — ED Notes (Signed)
Rate slowed to 400 ml/hr. Pt complaining of IV fluids burning.

## 2016-11-30 NOTE — ED Provider Notes (Signed)
[facility] DEPT Provider Note   CSN: [id number] Arrival date & time: 11/30/16  6294     History   Chief Complaint Chief Complaint  Patient presents with  . Chills    HPI [name] is a 40 y.o. female.  The history is provided by the patient.  Fever   This is a new problem. The current episode started 12 to 24 hours ago. The problem occurs constantly. The problem has not changed since onset.Associated symptoms include headaches, muscle aches and cough. Pertinent negatives include no chest pain, no diarrhea and no vomiting. She has tried acetaminophen for the symptoms. The treatment provided no relief.  Patient with h/o Crohn's disease, s/p renal transplant presents with fever She reports myalgias She also has headache She reports cough No abd pain No dysuria She reports pain in left arm from recent IV placement  Past Medical History:  Diagnosis Date  . Anxiety    OCD  . Cancer [facility]) 2001   renal cell , breast cancer both breast 2011, cervical cancer 2013  . Chronic anemia   . Chronic pain   . Complication of anesthesia    hard to sedate dr hung aware  . Crohn's disease ([place])   . Fracture 05/01/2013   Right foot, in cast  . History of blood transfusion 2 years ago  . Hyperlipidemia   . Hypothyroidism    parathroid runs low  . Meningioma ([place])   . Panic disorder   . PONV (postoperative nausea and vomiting)   . Renal disorder    S/p '11-Kidney transplant-Dr. Florene Glen follows    Patient Active Problem List   Diagnosis Date Noted  . UTI (lower urinary tract infection) 11/20/2013  . CKD (chronic kidney disease) stage 3, GFR 30-59 ml/min 08/25/2013  . Perirectal abscess 08/24/2013  . ESRD (end stage renal disease) ([place]) 03/27/2011  . Kidney transplant status, living unrelated donor 03/27/2011  . Anemia 03/27/2011  . Type 2 diabetes mellitus ([place]) 03/27/2011  . Osteoarthritis of right knee 03/27/2011  . Lumbago 03/27/2011  . HTN (hypertension) 03/27/2011    . HLD (hyperlipidemia) 03/27/2011  . Generalized anxiety disorder 03/27/2011  . Depression 03/27/2011  . Chronic steroid use 03/27/2011  . Crohn's disease ([place]) 03/27/2011  . OCD (obsessive compulsive disorder) 03/27/2011  . 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 Obstructive sleep apnea, adult 03/27/2011    Past Surgical History:  Procedure Laterality Date  . ABDOMINAL HYSTERECTOMY  02/13/2012   Procedure: HYSTERECTOMY ABDOMINAL;  Surgeon: Cyril Mourning, MD;  Location: [facility];  Service: Gynecology;  Laterality: N/A;  . BRAVO [place] STUDY N/A 11/27/2013   Procedure: BRAVO [place];  Surgeon: Beryle Beams, MD;  [facility];  Service: Endoscopy;  Laterality: N/A;  . BRAVO [place] STUDY N/A 12/11/2013   Procedure: BRAVO [place];  Surgeon: Beryle Beams, MD;  [facility];  Service: Endoscopy;  Laterality: N/A;  . breast lumpectomy and reconstruction Bilateral 2011  . BREAST SURGERY     bilateral lumpectomy 4'11  . COLONOSCOPY N/A 09/25/2013   Procedure: COLONOSCOPY;  Surgeon: Beryle Beams, MD;  [facility];  Service: Endoscopy;  Laterality: N/A;  . COLONOSCOPY WITH PROPOFOL N/A 07/29/2015   Procedure: COLONOSCOPY WITH PROPOFOL;  Surgeon: Carol Ada, MD;  [facility];  Service: Endoscopy;  Laterality: N/A;  . CYSTOSCOPY  02/13/2012   Procedure: CYSTOSCOPY;  Surgeon: Franchot Gallo, MD;  Location: [facility];  Service: Urology;  Laterality: N/A;  insertion of  ureteral catheter , removed per Dr Diona Fanti   . ESOPHAGEAL MANOMETRY N/A 08/10/2013   Procedure: ESOPHAGEAL MANOMETRY (EM);  Surgeon: Beryle Beams, MD;  [facility];  Service: Endoscopy;  Laterality: N/A;  . ESOPHAGOGASTRODUODENOSCOPY N/A 09/25/2013   Procedure: ESOPHAGOGASTRODUODENOSCOPY (EGD);  Surgeon: Beryle Beams, MD;  Location[facility];  Service: Endoscopy;  Laterality: N/A;  . ESOPHAGOGASTRODUODENOSCOPY N/A 11/27/2013   Procedure: ESOPHAGOGASTRODUODENOSCOPY (EGD);   Surgeon: Beryle Beams, MD;  [facility]: [facility];  Service: Endoscopy;  Laterality: N/A;  . ESOPHAGOGASTRODUODENOSCOPY N/A 12/11/2013   Procedure: ESOPHAGOGASTRODUODENOSCOPY (EGD);  Surgeon: Beryle Beams, MD;  Location: [facility];  Service: Endoscopy;  Laterality: N/A;  . GROIN DISSECTION Left 10/09/2013   Procedure: INCISION AND DRAINAGE OF LEFT GROIN ABSCESS;  Surgeon: Shann Medal, MD;  [facility];  Service: General;  Laterality: Left;  . INCISION AND DRAINAGE ABSCESS N/A 08/26/2013   Procedure: INCISION AND DRAINAGE PERIRECTAL ABSCESS;  Surgeon: Jamesetta So, MD;  [facility];  Service: General;  Laterality: N/A;  . KNEE SURGERY Left april 2003   arthroscopy  . LOWER EXTREMITY ARTERIAL DOPLLERS  05/06/2012   Bilat ABIs were attempted-not accessible due to calcified veins. Bilat TBIs demonstrated no obtainable flow through both great toes. Bilat PVR ankle regions demonstrated moderately abnormal pulsatile flow. Lft SFA demonstrated a 50-69% diameter reduction. Lft runoff demonstrated occlusive diseasewith reconstitution of flow noted distally.  Marland Kitchen [place] and 2011   last '11-[facility]  . PARATHYROIDECTOMY  02-20-2001  . SLEEVE GASTROPLASTY     for GERD, hiatal hernia  . TUBAL LIGATION  02-19-2000    OB History    Gravida Para Term Preterm AB Living             0   SAB TAB Ectopic Multiple Live Births                   Home Medications    Prior to Admission medications   Medication Sig Start Date End Date Taking? Authorizing Provider  acyclovir cream (ZOVIRAX) 5 % Use as directed 5 times daily as needed 11/06/16  Yes Historical Provider, MD  ALPRAZolam (XANAX) 0.5 MG tablet Take 0.25-0.5 mg by mouth 3 (three) times daily as needed for anxiety or sleep.  02/14/16  Yes Historical Provider, MD  atorvastatin (LIPITOR) 20 MG tablet TAKE ONE TABLET BY MOUTH NIGHTLY. 10/18/16  Yes Historical Provider, MD  calcitRIOL (ROCALTROL) 0.25 MCG  capsule Take 1.75 mcg by mouth every morning. Patient states that she takes 6 total 05/05/13  Yes Niel Hummer, NP  calcium carbonate (OS-CAL - DOSED IN MG OF ELEMENTAL CALCIUM) 1250 MG tablet Take 5,000 mg by mouth 3 (three) times daily.  05/05/13  Yes Niel Hummer, NP  cetirizine (ZYRTEC) 10 MG tablet Take 10 mg by mouth every evening. 05/05/13  Yes Niel Hummer, NP  cyclobenzaprine (FLEXERIL) 10 MG tablet TAKE ONE TABLET BY MOUTH THREE TIMES DAILY AS NEEDED FOR MUSCLE SPASMS. 06/16/15  Yes Susy Frizzle, MD  fluocinonide cream (LIDEX) 1.93 % Apply 1 application topically daily as needed (flare up as needed).   Yes Historical Provider, MD  FLUoxetine (PROZAC) 20 MG capsule Take 60 mg by mouth daily.   Yes Historical Provider, MD  fluticasone (FLONASE) 50 MCG/ACT nasal spray Place 2 sprays into both nostrils daily as needed for allergies or rhinitis.   Yes Historical Provider, MD  hydrOXYzine (ATARAX/VISTARIL) 50 MG tablet Take 50 mg  by mouth at bedtime.    Yes Historical Provider, MD  hyoscyamine (LEVSIN/SL) 0.125 MG SL tablet Place 1 tablet (0.125 mg total) under the tongue every 4 (four) hours as needed. 07/31/15  Yes April Palumbo, MD  LINZESS 145 MCG CAPS capsule TAKE ONE CAPSULE BY MOUTH EVERY DAY. Patient taking differently: TAKE ONE CAPSULE BY MOUTH EVERY DAY PRN CONSTIPATION 06/15/15  Yes Susy Frizzle, MD  mycophenolate (MYFORTIC) 180 MG EC tablet Take 540 mg by mouth 2 (two) times daily. 05/05/13  Yes Niel Hummer, NP  omeprazole (PRILOSEC) 40 MG capsule Take 40 mg by mouth 2 (two) times daily.    Yes Historical Provider, MD  Oxycodone HCl 10 MG TABS Take 1 tablet (10 mg total) by mouth 3 (three) times daily as needed (pain). For pain 04/19/15  Yes Susy Frizzle, MD  oxyCODONE-acetaminophen (PERCOCET/ROXICET) 5-325 MG tablet Take 1 tablet by mouth every 4 (four) hours as needed for severe pain. 11/29/16  Yes Forde Dandy, MD  predniSONE (DELTASONE) 10 MG tablet Take 10 mg by mouth daily  with breakfast.    Yes Historical Provider, MD  simvastatin (ZOCOR) 20 MG tablet Take 20 mg by mouth at bedtime.   Yes Historical Provider, MD  tacrolimus (PROGRAF) 1 MG capsule Take 3 mg by mouth 2 (two) times daily.   Yes Historical Provider, MD  Vitamin D, Ergocalciferol, (DRISDOL) 50000 units CAPS capsule Take 50,000 Units by mouth every Monday.    Yes Historical Provider, MD    Family History Family History  Problem Relation Age of Onset  . Diabetes Mother   . Hypertension Mother   . Diabetes Father   . Hypertension Father   . Cancer Father     Prostate cancer  . Obesity Sister   . Stroke Maternal Grandmother   . Heart attack Maternal Grandfather   . Polycystic ovary syndrome Sister     Social History Social History  Substance Use Topics  . Smoking status: Never Smoker  . Smokeless tobacco: Never Used  . Alcohol use No     Allergies   Barium-containing compounds; Dilaudid [hydromorphone hcl]; Hydromorphone; Ivp dye [iodinated diagnostic agents]; Shellfish allergy; Vancomycin; Adhesive [tape]; Infed [iron dextran]; Sulfa antibiotics; and Iopamidol   Review of Systems Review of Systems  Constitutional: Positive for fatigue and fever.  Respiratory: Positive for cough.   Cardiovascular: Negative for chest pain.  Gastrointestinal: Negative for diarrhea and vomiting.  Musculoskeletal: Positive for myalgias.  Skin: Negative for rash.  Neurological: Positive for headaches.  All other systems reviewed and are negative.    Physical Exam Updated Vital Signs BP 114/83   Pulse 102   Temp 101.3 F (38.5 C) (Oral)   Resp 20   Ht 5\' 3"  (1.6 m)   Wt 80.3 kg   LMP 01/30/2012   SpO2 96%   BMI 31.35 kg/m   Physical Exam CONSTITUTIONAL: Well developed/well nourished, mildly anxious HEAD: Normocephalic/atraumatic EYES: EOMI/PERRL ENMT: Mucous membranes moist, uvula midline, no erythema/exudates NECK: supple no meningeal signs SPINE/BACK:entire spine nontender CV:  S1/S2 noted, no murmurs/rubs/gallops noted LUNGS: decreased BS in the bases, no distress noted ABDOMEN: soft, nontender, no rebound or guarding, bowel sounds noted throughout abdomen GU:no cva tenderness NEURO: Pt is awake/alert/appropriate, moves all extremitiesx4.  No facial droop.   EXTREMITIES: pulses normal/equal, full ROM, scarring noted to left UE but no erythema/edema/abscess noted SKIN: warm, color normal, no rash PSYCH: anxious  ED Treatments / Results  Labs (all labs ordered are listed,  but only abnormal results are displayed) Labs Reviewed  COMPREHENSIVE METABOLIC PANEL - Abnormal; Notable for the following:       Result Value   Glucose, Bld 107 (*)    BUN 24 (*)    Creatinine, Ser 1.53 (*)    Calcium 8.7 (*)    ALT 12 (*)    GFR calc non Af Amer 41 (*)    GFR calc Af Amer 47 (*)    All other components within normal limits  CBC WITH DIFFERENTIAL/PLATELET - Abnormal; Notable for the following:    WBC 12.5 (*)    Hemoglobin 10.7 (*)    HCT 33.5 (*)    Neutro Abs 10.2 (*)    All other components within normal limits  I-STAT CG4 LACTIC ACID, ED - Abnormal; Notable for the following:    Lactic Acid, Venous 1.99 (*)    All other components within normal limits  I-STAT CHEM 8, ED - Abnormal; Notable for the following:    BUN 23 (*)    Creatinine, Ser 1.60 (*)    Glucose, Bld 103 (*)    Calcium, Ion 1.08 (*)    Hemoglobin 11.6 (*)    HCT 34.0 (*)    All other components within normal limits  CULTURE, BLOOD (ROUTINE X 2)  CULTURE, BLOOD (ROUTINE X 2)  URINE CULTURE  URINALYSIS, ROUTINE W REFLEX MICROSCOPIC  I-STAT BETA HCG BLOOD, ED ([facility], [facility], [facility] ONLY)  I-STAT CG4 LACTIC ACID, ED    EKG  EKG Interpretation None       Radiology No results found.  Procedures Procedures (including critical care time)  Medications Ordered in ED Medications  sodium chloride 0.9 % bolus 1,000 mL (1,000 mLs Intravenous New Bag/Given 11/30/16 0459)    And  sodium chloride 0.9  % bolus 1,000 mL (1,000 mLs Intravenous New Bag/Given 11/30/16 0454)    And  sodium chloride 0.9 % bolus 500 mL (not administered)  piperacillin-tazobactam (ZOSYN) IVPB 3.375 g (0 g Intravenous Stopped 11/30/16 0517)  azithromycin (ZITHROMAX) 500 mg in dextrose 5 % 250 mL IVPB (500 mg Intravenous New Bag/Given 11/30/16 0559)  cefTRIAXone (ROCEPHIN) 1 g in dextrose 5 % 50 mL IVPB (0 g Intravenous Stopped 11/30/16 0554)  fentaNYL (SUBLIMAZE) injection 50 mcg (50 mcg Intravenous Given 11/30/16 0518)  morphine 4 MG/ML injection 4 mg (4 mg Intravenous Given 11/30/16 0553)  diphenhydrAMINE (BENADRYL) injection 25 mg (25 mg Intravenous Given 11/30/16 0550)     Initial Impression / Assessment and Plan / ED Course  I have reviewed the triage vital signs and the nursing notes.  Pertinent labs & imaging results that were available during my care of the patient were reviewed by me and considered in my medical decision making (see chart for details).  Clinical Course     Pt here with fever/myalgias/cough She has pneumonia by CXR She was initially given zosyn due to concern for sepsis of unknown etiology I have now ordered rocephin/azithromycin for community acquired pneumonia 5:29 AM I discussed that admission would be warranted due to pneumonia and history of immune suppression Patient prefers to be discharged She is awake/alert, no distress, vitals improved, not septic appearing at this time Will load with antibiotics here and then d/c home BP 107/66   Pulse 103   Temp 100.4 F (38 C) (Oral)   Resp 14   Ht 5\' 3"  (1.6 m)   Wt 80.3 kg   LMP 01/30/2012   SpO2 95%   BMI 31.35 kg/m  6:36 AM Pt remains stable No hypoxia No hypotension She wishes to be discharged Will start zithromax (due to multiple drug allergies with levaquin and drug interactions, this is likely best option) We discussed strict return precautions   Final Clinical Impressions(s) / ED Diagnoses   Final diagnoses:    Community acquired pneumonia of right lung, unspecified part of lung    New Prescriptions New Prescriptions   AZITHROMYCIN (ZITHROMAX) 250 MG TABLET    One tablet PO daily for 4 days     Ripley Fraise, MD 11/30/16 [phone]

## 2016-12-01 LAB — URINE CULTURE

## 2016-12-05 LAB — CULTURE, BLOOD (ROUTINE X 2): Culture: NO GROWTH

## 2016-12-13 ENCOUNTER — Ambulatory Visit (INDEPENDENT_AMBULATORY_CARE_PROVIDER_SITE_OTHER): Payer: Managed Care, Other (non HMO) | Admitting: Orthopaedic Surgery

## 2016-12-13 ENCOUNTER — Encounter (INDEPENDENT_AMBULATORY_CARE_PROVIDER_SITE_OTHER): Payer: Self-pay | Admitting: Orthopaedic Surgery

## 2016-12-13 VITALS — Ht 63.0 in | Wt 177.0 lb

## 2016-12-13 DIAGNOSIS — M25532 Pain in left wrist: Secondary | ICD-10-CM

## 2016-12-13 MED ORDER — OXYCODONE HCL 5 MG PO CAPS: 5.0000 mg | ORAL_CAPSULE | Freq: Every evening | ORAL | 0 refills | Status: DC | PRN

## 2016-12-13 NOTE — Progress Notes (Signed)
Office Visit Note   Patient: [name]           Date of Birth: 05-02-1976           MRN: [id number] Visit Date: 12/13/2016              Requested by: Aura Dials, PA-C [address] [place], [place] [address] PCP: Aura Dials, PA-C   Assessment & Plan: Visit Diagnoses: Left forearm pain probably related to recent multiple attempts at IV insertion. Appears that there is some volar muscle irritation and possibly irritation of the ulna and median nerves.  Plan: Volar wrist splint. OxyIR for pain. Follow up 2-3 weeks if no improvement. Hopefully this will resolve over period of several weeks. I think her ability to heal is somewhat compromised because of her present medicines.  Follow-Up Instructions: No Follow-up on file.   Orders:  No orders of the defined types were placed in this encounter.  No orders of the defined types were placed in this encounter.     Procedures: No procedures performed   Clinical Data: No additional findings.   Subjective: No chief complaint on file.   Pt is having issues with her left forearm she had multiple Sticks for an IV when she [facility].  She has had this pain in her hand also and starts in the wrist. She has weakness and has trouble driving and grasping. She has had this pain for about 2 weeks.   [name] relates that she has not had any swelling. She denies any loss of motion of her left hand. She is presently on prednisone as part of her treatment for a kidney transplant. She is limited in her ability to take any medicines in regards to the transplant. She's had some pain that refers from the forearm to the wrist and  to the shoulder. Occasionally has some tingling. He denies any redness or swelling. She does have some palpable discomfort along the volar ulnar muscle groups left forearm.  Review of Systems   Objective: Vital Signs: LMP 01/30/2012   Physical Exam  Ortho Exam left forearm without  edema or erythema. There appears to be some resolving ecchymosis. Mild tenderness over the median nerve at the wrist. Neurologically intact to the fingers. There is no swelling. Good grip and release. No pain to suggest de Quervain's. No pain with flexion-extension of the elbow. No pain referred to the left arm with motion of the cervical spine. I did not feel the evidence of DVT in the forearm. There are multiple areas of cutaneous pain but without other objective signs  No specialty comments available.  Imaging: No results found.   PMFS History: Patient Active Problem List   Diagnosis Date Noted  . UTI (lower urinary tract infection) 11/20/2013  . CKD (chronic kidney disease) stage 3, GFR 30-59 ml/min 08/25/2013  . Perirectal abscess 08/24/2013  . ESRD (end stage renal disease) ([place]) 03/27/2011  . Kidney transplant status, living unrelated donor 03/27/2011  . Anemia 03/27/2011  . Type 2 diabetes mellitus ([place]) 03/27/2011  . Osteoarthritis of right knee 03/27/2011  . Lumbago 03/27/2011  . HTN (hypertension) 03/27/2011  . HLD (hyperlipidemia) 03/27/2011  . Generalized anxiety disorder 03/27/2011  . Depression 03/27/2011  . Chronic steroid use 03/27/2011  . Crohn's disease ([place]) 03/27/2011  . OCD (obsessive compulsive disorder) 03/27/2011  . 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 Obstructive sleep apnea, adult 03/27/2011   Past Medical History:  Diagnosis Date  . Anxiety    OCD  . Cancer [facility]) 2001   renal cell , breast cancer both breast 2011, cervical cancer 2013  . Chronic anemia   . Chronic pain   . Complication of anesthesia    hard to sedate dr hung aware  . Crohn's disease ([place])   . Fracture 05/01/2013   Right foot, in cast  . History of blood transfusion 2 years ago  . Hyperlipidemia   . Hypothyroidism    parathroid runs low  . Meningioma ([place])   . Panic disorder   . PONV (postoperative nausea and vomiting)   . Renal disorder    S/p '11-Kidney  transplant-Dr. Florene Glen follows    Family History  Problem Relation Age of Onset  . Diabetes Mother   . Hypertension Mother   . Diabetes Father   . Hypertension Father   . Cancer Father     Prostate cancer  . Obesity Sister   . Stroke Maternal Grandmother   . Heart attack Maternal Grandfather   . Polycystic ovary syndrome Sister     Past Surgical History:  Procedure Laterality Date  . ABDOMINAL HYSTERECTOMY  02/13/2012   Procedure: HYSTERECTOMY ABDOMINAL;  Surgeon: Cyril Mourning, MD;  Location: [facility];  Service: Gynecology;  Laterality: N/A;  . BRAVO [place] STUDY N/A 11/27/2013   Procedure: BRAVO [place];  Surgeon: Beryle Beams, MD;  [facility];  Service: Endoscopy;  Laterality: N/A;  . BRAVO [place] STUDY N/A 12/11/2013   Procedure: BRAVO [place];  Surgeon: Beryle Beams, MD;  [facility];  Service: Endoscopy;  Laterality: N/A;  . breast lumpectomy and reconstruction Bilateral 2011  . BREAST SURGERY     bilateral lumpectomy 4'11  . COLONOSCOPY N/A 09/25/2013   Procedure: COLONOSCOPY;  Surgeon: Beryle Beams, MD;  [facility];  Service: Endoscopy;  Laterality: N/A;  . COLONOSCOPY WITH PROPOFOL N/A 07/29/2015   Procedure: COLONOSCOPY WITH PROPOFOL;  Surgeon: Carol Ada, MD;  [facility];  Service: Endoscopy;  Laterality: N/A;  . CYSTOSCOPY  02/13/2012   Procedure: CYSTOSCOPY;  Surgeon: Franchot Gallo, MD;  Location: [facility];  Service: Urology;  Laterality: N/A;  insertion of ureteral catheter , removed per Dr Diona Fanti   . ESOPHAGEAL MANOMETRY N/A 08/10/2013   Procedure: ESOPHAGEAL MANOMETRY (EM);  Surgeon: Beryle Beams, MD;  [facility];  Service: Endoscopy;  Laterality: N/A;  . ESOPHAGOGASTRODUODENOSCOPY N/A 09/25/2013   Procedure: ESOPHAGOGASTRODUODENOSCOPY (EGD);  Surgeon: Beryle Beams, MD;  Location[facility];  Service: Endoscopy;  Laterality: N/A;  . ESOPHAGOGASTRODUODENOSCOPY N/A 11/27/2013   Procedure:  ESOPHAGOGASTRODUODENOSCOPY (EGD);  Surgeon: Beryle Beams, MD;  [facility];  Service: Endoscopy;  Laterality: N/A;  . ESOPHAGOGASTRODUODENOSCOPY N/A 12/11/2013   Procedure: ESOPHAGOGASTRODUODENOSCOPY (EGD);  Surgeon: Beryle Beams, MD;  Location[facility];  Service: Endoscopy;  Laterality: N/A;  . GROIN DISSECTION Left 10/09/2013   Procedure: INCISION AND DRAINAGE OF LEFT GROIN ABSCESS;  Surgeon: Shann Medal, MD;  [facility];  Service: General;  Laterality: Left;  . INCISION AND DRAINAGE ABSCESS N/A 08/26/2013   Procedure: INCISION AND DRAINAGE PERIRECTAL ABSCESS;  Surgeon: Jamesetta So, MD;  Location: [facility];  Service: General;  Laterality: N/A;  . KNEE SURGERY Left april 2003   arthroscopy  . LOWER EXTREMITY ARTERIAL DOPLLERS  05/06/2012   Bilat ABIs were attempted-not accessible due to calcified veins. Bilat TBIs demonstrated no obtainable flow through both great toes. Bilat PVR  ankle regions demonstrated moderately abnormal pulsatile flow. Lft SFA demonstrated a 50-69% diameter reduction. Lft runoff demonstrated occlusive diseasewith reconstitution of flow noted distally.  Marland Kitchen [place] and 2011   last '11-[facility]  . PARATHYROIDECTOMY  02-20-2001  . SLEEVE GASTROPLASTY     for GERD, hiatal hernia  . TUBAL LIGATION  02-19-2000   Social History   Occupational History  . Not on file.   Social History Main Topics  . Smoking status: Never Smoker  . Smokeless tobacco: Never Used  . Alcohol use No  . Drug use: No  . Sexual activity: Not Currently

## 2016-12-26 ENCOUNTER — Ambulatory Visit (INDEPENDENT_AMBULATORY_CARE_PROVIDER_SITE_OTHER): Payer: Managed Care, Other (non HMO) | Admitting: Orthopedic Surgery

## 2016-12-27 ENCOUNTER — Other Ambulatory Visit: Payer: Self-pay | Admitting: *Deleted

## 2016-12-27 NOTE — Progress Notes (Unsigned)
Joni Fears, MD   Biagio Borg, PA-C [address] [place], [place]  [address]                             [phone]   RAYONA SARDINHA MRN:  [id number] DOB/SEX:  [birth year]/female  ORTHOPAEDIC HISTORY & PHYSICAL  CHIEF COMPLAINT:  Painful {Left/right:33004} Hip  HISTORY: Jerzy Crotteau Thurstonis a 41 y.o. female  Who has a history of pain and functional disability in the {Left/right:3049041} hip(s) due to {trauma,arthritis:3049029} and patient has failed non-surgical conservative treatments for greater than 12 weeks to include {nonsurgical conservative treatment (must select two):3049030}.  Onset of symptoms was {abrupt, gradual:20671} starting {1->10 years:3049031} years ago with {stable, gradual worsening, rapidly worsening:3049032} course since that time.The patient noted {no past surgeries, prior procedures:20693} on the {Left/right:3049041} hip(s).  Patient currently rates pain in the {Left/right:3049041} hip at {1-10:3049035} out of 10 with activity. Patient has {night pain, worsening of pain:3049039}. Patient has evidence of {Radiographic or MRI evidence of (must document one of the below):3046104} by imaging studies. This condition presents safety issues increasing the risk of falls. This patient has had {failure of previous osteotomy, proximal femur fracture:3049040}.  There is no current active infection.  PAST MEDICAL HISTORY: Patient Active Problem List   Diagnosis Date Noted  . UTI (lower urinary tract infection) 11/20/2013  . CKD (chronic kidney disease) stage 3, GFR 30-59 ml/min 08/25/2013  . Perirectal abscess 08/24/2013  . ESRD (end stage renal disease) ([place]) 03/27/2011  . Kidney transplant status, living unrelated donor 03/27/2011  . Anemia 03/27/2011  . Type 2 diabetes mellitus ([place]) 03/27/2011  . Osteoarthritis of right knee 03/27/2011  . Lumbago 03/27/2011  . HTN (hypertension) 03/27/2011  . HLD (hyperlipidemia) 03/27/2011  . Generalized anxiety disorder  03/27/2011  . Depression 03/27/2011  . Chronic steroid use 03/27/2011  . Crohn's disease ([place]) 03/27/2011  . OCD (obsessive compulsive disorder) 03/27/2011  . 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 Obstructive sleep apnea, adult 03/27/2011   Past Medical History:  Diagnosis Date  . Anxiety    OCD  . Cancer [facility]) 2001   renal cell , breast cancer both breast 2011, cervical cancer 2013  . Chronic anemia   . Chronic pain   . Complication of anesthesia    hard to sedate dr hung aware  . Crohn's disease ([place])   . Fracture 05/01/2013   Right foot, in cast  . History of blood transfusion 2 years ago  . Hyperlipidemia   . Hypothyroidism    parathroid runs low  . Meningioma ([place])   . Panic disorder   . PONV (postoperative nausea and vomiting)   . Renal disorder    S/p '11-Kidney transplant-Dr. Florene Glen follows   Past Surgical History:  Procedure Laterality Date  . ABDOMINAL HYSTERECTOMY  02/13/2012   Procedure: HYSTERECTOMY ABDOMINAL;  Surgeon: Cyril Mourning, MD;  Location: [facility];  Service: Gynecology;  Laterality: N/A;  . BRAVO [place] STUDY N/A 11/27/2013   Procedure: BRAVO [place];  Surgeon: Beryle Beams, MD;  Location: [facility];  Service: Endoscopy;  Laterality: N/A;  . BRAVO [place] STUDY N/A 12/11/2013   Procedure: BRAVO [place];  Surgeon: Beryle Beams, MD;  [facility];  Service: Endoscopy;  Laterality: N/A;  . breast lumpectomy and reconstruction Bilateral 2011  . BREAST SURGERY     bilateral lumpectomy 4'11  . COLONOSCOPY N/A 09/25/2013   Procedure: COLONOSCOPY;  Surgeon:  Beryle Beams, MD;  Location: [facility];  Service: Endoscopy;  Laterality: N/A;  . COLONOSCOPY WITH PROPOFOL N/A 07/29/2015   Procedure: COLONOSCOPY WITH PROPOFOL;  Surgeon: Carol Ada, MD;  [facility];  Service: Endoscopy;  Laterality: N/A;  . CYSTOSCOPY  02/13/2012   Procedure: CYSTOSCOPY;  Surgeon: Franchot Gallo, MD;  Location: [facility];  Service: Urology;   Laterality: N/A;  insertion of ureteral catheter , removed per Dr Diona Fanti   . ESOPHAGEAL MANOMETRY N/A 08/10/2013   Procedure: ESOPHAGEAL MANOMETRY (EM);  Surgeon: Beryle Beams, MD;  [facility];  Service: Endoscopy;  Laterality: N/A;  . ESOPHAGOGASTRODUODENOSCOPY N/A 09/25/2013   Procedure: ESOPHAGOGASTRODUODENOSCOPY (EGD);  Surgeon: Beryle Beams, MD;  Location: [facility];  Service: Endoscopy;  Laterality: N/A;  . ESOPHAGOGASTRODUODENOSCOPY N/A 11/27/2013   Procedure: ESOPHAGOGASTRODUODENOSCOPY (EGD);  Surgeon: Beryle Beams, MD;  Location: [facility];  Service: Endoscopy;  Laterality: N/A;  . ESOPHAGOGASTRODUODENOSCOPY N/A 12/11/2013   Procedure: ESOPHAGOGASTRODUODENOSCOPY (EGD);  Surgeon: Beryle Beams, MD;  Location: [facility];  Service: Endoscopy;  Laterality: N/A;  . GROIN DISSECTION Left 10/09/2013   Procedure: INCISION AND DRAINAGE OF LEFT GROIN ABSCESS;  Surgeon: Shann Medal, MD;  [facility];  Service: General;  Laterality: Left;  . INCISION AND DRAINAGE ABSCESS N/A 08/26/2013   Procedure: INCISION AND DRAINAGE PERIRECTAL ABSCESS;  Surgeon: Jamesetta So, MD;  Location: [facility];  Service: General;  Laterality: N/A;  . KNEE SURGERY Left april 2003   arthroscopy  . LOWER EXTREMITY ARTERIAL DOPLLERS  05/06/2012   Bilat ABIs were attempted-not accessible due to calcified veins. Bilat TBIs demonstrated no obtainable flow through both great toes. Bilat PVR ankle regions demonstrated moderately abnormal pulsatile flow. Lft SFA demonstrated a 50-69% diameter reduction. Lft runoff demonstrated occlusive diseasewith reconstitution of flow noted distally.  Marland Kitchen [place] and 2011   last '11-[facility]  . PARATHYROIDECTOMY  02-20-2001  . SLEEVE GASTROPLASTY     for GERD, hiatal hernia  . TUBAL LIGATION  02-19-2000     MEDICATIONS PRIOR TO ADMISSION:  Current Outpatient Prescriptions:  .  acyclovir cream (ZOVIRAX) 5 %, Use as directed 5 times  daily as needed, Disp: , Rfl:  .  ALPRAZolam (XANAX) 0.5 MG tablet, Take 0.25-0.5 mg by mouth 3 (three) times daily as needed for anxiety or sleep. , Disp: , Rfl:  .  atorvastatin (LIPITOR) 20 MG tablet, TAKE ONE TABLET BY MOUTH NIGHTLY., Disp: , Rfl:  .  azithromycin (ZITHROMAX) 250 MG tablet, One tablet PO daily for 4 days, Disp: 4 tablet, Rfl: 0 .  calcitRIOL (ROCALTROL) 0.25 MCG capsule, Take 1.75 mcg by mouth every morning. Patient states that she takes 6 total, Disp: , Rfl:  .  calcium carbonate (OS-CAL - DOSED IN MG OF ELEMENTAL CALCIUM) 1250 MG tablet, Take 5,000 mg by mouth 3 (three) times daily. , Disp: , Rfl:  .  cetirizine (ZYRTEC) 10 MG tablet, Take 10 mg by mouth every evening., Disp: , Rfl:  .  cyclobenzaprine (FLEXERIL) 10 MG tablet, TAKE ONE TABLET BY MOUTH THREE TIMES DAILY AS NEEDED FOR MUSCLE SPASMS., Disp: 90 tablet, Rfl: 0 .  fluocinonide cream (LIDEX) 5.10 %, Apply 1 application topically daily as needed (flare up as needed)., Disp: , Rfl:  .  FLUoxetine (PROZAC) 20 MG capsule, Take 60 mg by mouth daily., Disp: , Rfl:  .  fluticasone (FLONASE) 50 MCG/ACT nasal spray, Place 2 sprays into both nostrils daily as needed for allergies  or rhinitis., Disp: , Rfl:  .  hydrOXYzine (ATARAX/VISTARIL) 50 MG tablet, Take 50 mg by mouth at bedtime. , Disp: , Rfl:  .  hyoscyamine (LEVSIN/SL) 0.125 MG SL tablet, Place 1 tablet (0.125 mg total) under the tongue every 4 (four) hours as needed., Disp: 30 tablet, Rfl: 0 .  LINZESS 145 MCG CAPS capsule, TAKE ONE CAPSULE BY MOUTH EVERY DAY. (Patient taking differently: TAKE ONE CAPSULE BY MOUTH EVERY DAY PRN CONSTIPATION), Disp: 30 capsule, Rfl: 11 .  mycophenolate (MYFORTIC) 180 MG EC tablet, Take 540 mg by mouth 2 (two) times daily., Disp: , Rfl:  .  omeprazole (PRILOSEC) 40 MG capsule, Take 40 mg by mouth 2 (two) times daily. , Disp: , Rfl:  .  oxycodone (OXY-IR) 5 MG capsule, Take 1 capsule (5 mg total) by mouth at bedtime as needed., Disp: 30  capsule, Rfl: 0 .  Oxycodone HCl 10 MG TABS, Take 1 tablet (10 mg total) by mouth 3 (three) times daily as needed (pain). For pain, Disp: 90 tablet, Rfl: 0 .  oxyCODONE-acetaminophen (PERCOCET/ROXICET) 5-325 MG tablet, Take 1 tablet by mouth every 4 (four) hours as needed for severe pain., Disp: 6 tablet, Rfl: 0 .  predniSONE (DELTASONE) 10 MG tablet, Take 10 mg by mouth daily with breakfast. , Disp: , Rfl:  .  simvastatin (ZOCOR) 20 MG tablet, Take 20 mg by mouth at bedtime., Disp: , Rfl:  .  tacrolimus (PROGRAF) 1 MG capsule, Take 3 mg by mouth 2 (two) times daily., Disp: , Rfl:  .  Vitamin D, Ergocalciferol, (DRISDOL) 50000 units CAPS capsule, Take 50,000 Units by mouth every Monday. , Disp: , Rfl:    ALLERGIES:   Allergies  Allergen Reactions  . Barium-Containing Compounds Shortness Of Breath  . Dilaudid [Hydromorphone Hcl] Anaphylaxis  . Hydromorphone Shortness Of Breath  . Ivp Dye [Iodinated Diagnostic Agents] Anaphylaxis  . Shellfish Allergy Shortness Of Breath and Itching    Causes red, patchy areas to form  . Vancomycin Anaphylaxis  . Adhesive [Tape] Other (See Comments)    Dermatitis   . Infed [Iron Dextran] Itching  . Sulfa Antibiotics Hives  . Iopamidol Itching and Rash    REVIEW OF SYSTEMS:  ROS  FAMILY HISTORY:   Family History  Problem Relation Age of Onset  . Diabetes Mother   . Hypertension Mother   . Diabetes Father   . Hypertension Father   . Cancer Father     Prostate cancer  . Obesity Sister   . Stroke Maternal Grandmother   . Heart attack Maternal Grandfather   . Polycystic ovary syndrome Sister     SOCIAL HISTORY:   Social History   Occupational History  . Not on file.   Social History Main Topics  . Smoking status: Never Smoker  . Smokeless tobacco: Never Used  . Alcohol use No  . Drug use: No  . Sexual activity: Not Currently     EXAMINATION:  Vital signs in last 24 hours: LMP 01/30/2012   Physical Exam Ortho Exam  Imaging  Review Plain radiographs demonstrate {mild, mod, sev:15682} degenerative joint disease of the {left/right/bi:30031} hip. The bone quality appears to be {good/fair/poor/excellent:33178} for age and reported activity level.  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 Anxiety    OCD  . Cancer [facility]) 2001   renal cell , breast cancer both breast 2011, cervical cancer 2013  . Chronic anemia   . Chronic pain   .  Complication of anesthesia    hard to sedate dr hung aware  . Crohn's disease ([place])   . Fracture 05/01/2013   Right foot, in cast  . History of blood transfusion 2 years ago  . Hyperlipidemia   . Hypothyroidism    parathroid runs low  . Meningioma ([place])   . Panic disorder   . PONV (postoperative nausea and vomiting)   . Renal disorder    S/p '11-Kidney transplant-Dr. Florene Glen follows    Plan: for {Hip or for disposition:15846}.  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 The treatment options including medical management, injection therapy, arthroscopy and arthroplasty were discussed at length. The risks and benefits of total hip arthroplasty were presented and reviewed. The risks due to aseptic loosening, infection, stiffness, dislocation/subluxation,  thromboembolic complications and other imponderables were discussed.  The patient acknowledged the explanation, agreed to proceed with the plan. The clearance notes recently received were reviewed and concurs with proceeding then surgical intervention.  Patient is being admitted for inpatient treatment for surgery, pain control, PT, OT, prophylactic antibiotics, VTE prophylaxis, progressive ambulation and ADL's and discharge planning.The patient is planning to be discharged {home  with home health services/to inpatient PTWSF:6812751}   Biagio Borg 12/27/2016, 11:50 AM

## 2017-02-07 ENCOUNTER — Emergency Department (HOSPITAL_COMMUNITY): Payer: Managed Care, Other (non HMO)

## 2017-02-07 ENCOUNTER — Encounter (HOSPITAL_COMMUNITY): Payer: Self-pay | Admitting: Emergency Medicine

## 2017-02-07 ENCOUNTER — Emergency Department (HOSPITAL_COMMUNITY)
Admission: EM | Admit: 2017-02-07 | Discharge: 2017-02-07 | Disposition: A | Discharge status: Home / Self Care | Payer: Managed Care, Other (non HMO) | Attending: Physician Assistant | Admitting: Physician Assistant

## 2017-02-07 DIAGNOSIS — Z7984 Long term (current) use of oral hypoglycemic drugs: Secondary | ICD-10-CM | POA: Insufficient documentation

## 2017-02-07 DIAGNOSIS — Z853 Personal history of malignant neoplasm of breast: Secondary | ICD-10-CM | POA: Diagnosis not present

## 2017-02-07 DIAGNOSIS — T7840XA Allergy, unspecified, initial encounter: Secondary | ICD-10-CM | POA: Diagnosis not present

## 2017-02-07 DIAGNOSIS — I120 Hypertensive chronic kidney disease with stage 5 chronic kidney disease or end stage renal disease: Secondary | ICD-10-CM | POA: Insufficient documentation

## 2017-02-07 DIAGNOSIS — E039 Hypothyroidism, unspecified: Secondary | ICD-10-CM | POA: Diagnosis not present

## 2017-02-07 DIAGNOSIS — N186 End stage renal disease: Secondary | ICD-10-CM | POA: Diagnosis not present

## 2017-02-07 DIAGNOSIS — R42 Dizziness and giddiness: Secondary | ICD-10-CM | POA: Diagnosis present

## 2017-02-07 DIAGNOSIS — E119 Type 2 diabetes mellitus without complications: Secondary | ICD-10-CM | POA: Insufficient documentation

## 2017-02-07 HISTORY — DX: Type 2 diabetes mellitus without complications: E11.9

## 2017-02-07 LAB — COMPREHENSIVE METABOLIC PANEL
ALT: 12 U/L — ABNORMAL LOW (ref 14–54)
AST: 15 U/L (ref 15–41)
Albumin: 4.7 g/dL (ref 3.5–5.0)
Alkaline Phosphatase: 56 U/L (ref 38–126)
Anion gap: 10 (ref 5–15)
BUN: 23 mg/dL — ABNORMAL HIGH (ref 6–20)
CO2: 25 mmol/L (ref 22–32)
Calcium: 9.2 mg/dL (ref 8.9–10.3)
Chloride: 105 mmol/L (ref 101–111)
Creatinine, Ser: 1.44 mg/dL — ABNORMAL HIGH (ref 0.44–1.00)
GFR calc Af Amer: 51 mL/min — ABNORMAL LOW (ref >60)
GFR calc non Af Amer: 44 mL/min — ABNORMAL LOW (ref >60)
Glucose, Bld: 93 mg/dL (ref 65–99)
Potassium: 4.2 mmol/L (ref 3.5–5.1)
Sodium: 140 mmol/L (ref 135–145)
Total Bilirubin: 0.9 mg/dL (ref 0.3–1.2)
Total Protein: 8.1 g/dL (ref 6.5–8.1)

## 2017-02-07 LAB — CBC
HCT: 36.2 % (ref 36.0–46.0)
Hemoglobin: 11.6 g/dL — ABNORMAL LOW (ref 12.0–15.0)
MCH: 26.5 pg (ref 26.0–34.0)
MCHC: 32 g/dL (ref 30.0–36.0)
MCV: 82.8 fL (ref 78.0–100.0)
Platelets: 268 10*3/uL (ref 150–400)
RBC: 4.37 MIL/uL (ref 3.87–5.11)
RDW: 13.4 % (ref 11.5–15.5)
WBC: 6.6 10*3/uL (ref 4.0–10.5)

## 2017-02-07 LAB — I-STAT CG4 LACTIC ACID, ED: Lactic Acid, Venous: 0.76 mmol/L (ref 0.5–1.9)

## 2017-02-07 LAB — I-STAT BETA HCG BLOOD, ED (MC, WL, AP ONLY): I-stat hCG, quantitative: <5 m[IU]/mL (ref <5)

## 2017-02-07 MED ORDER — METHYLPREDNISOLONE SODIUM SUCC 125 MG IJ SOLR
125.0000 mg | Freq: Once | INTRAMUSCULAR | Status: AC
  Administered 2017-02-07: 125 mg via INTRAVENOUS
  Filled 2017-02-07: qty 2

## 2017-02-07 MED ORDER — ONDANSETRON HCL 4 MG/2ML IJ SOLN
4.0000 mg | Freq: Once | INTRAMUSCULAR | Status: AC
  Administered 2017-02-07: 4 mg via INTRAVENOUS
  Filled 2017-02-07: qty 2

## 2017-02-07 MED ORDER — DIPHENHYDRAMINE HCL 25 MG PO TABS: 25.0000 mg | ORAL_TABLET | Freq: Four times a day (QID) | ORAL | 0 refills | Status: DC

## 2017-02-07 MED ORDER — PREDNISONE 10 MG PO TABS: 40.0000 mg | ORAL_TABLET | Freq: Every day | ORAL | 0 refills | Status: AC

## 2017-02-07 MED ORDER — DIPHENHYDRAMINE HCL 50 MG/ML IJ SOLN
25.0000 mg | Freq: Once | INTRAMUSCULAR | Status: AC
  Administered 2017-02-07: 25 mg via INTRAVENOUS
  Filled 2017-02-07: qty 1

## 2017-02-07 MED ORDER — DIPHENHYDRAMINE HCL 50 MG/ML IJ SOLN
25.0000 mg | Freq: Once | INTRAMUSCULAR | Status: AC
Start: 2017-02-07 — End: 2017-02-07
  Administered 2017-02-07: 25 mg via INTRAVENOUS
  Filled 2017-02-07: qty 1

## 2017-02-07 NOTE — ED Triage Notes (Addendum)
Pt reports she began to have generalized body swelling, SOB, and lightheadedness this am. Tightness felt in chest.  Hx of kidney transplant 20 years ago. Has not had any issues with transplant.

## 2017-02-07 NOTE — ED Provider Notes (Signed)
[facility] DEPT Provider Note   CSN: [id number] Arrival date & time: 02/07/17  1427     History   Chief Complaint Chief Complaint  Patient presents with  . Dizziness    HPI MALIYA MARICH is a 41 y.o. female.  The history is provided by the patient. No language interpreter was used.  Dizziness   ANALINA FILLA is a 41 y.o. female who presents to the Emergency Department complaining of dizziness, sob.  She is in her routine state of health at work today when a student came up to her and asked for some sugar for her grades. She noticed smell that she did not like and she developed nauseousness and dizziness. She went to the nurse's office and the nurse noticed that she had a rash on her face and upper chest. Over time she has developed swelling of her abdomen as well as red splotches on her legs and she is having shortness of breath. Her swelling feels like it is external and she has an achy discomfort in her skin and mild itching. She denies any fevers, vomiting. She was started on metformin 1 week ago. She has a history of renal transplant 20 years ago and is on chronic immunosuppression. Past Medical History:  Diagnosis Date  . Anxiety    OCD  . Cancer [facility]) 2001   renal cell , breast cancer both breast 2011, cervical cancer 2013  . Chronic anemia   . Chronic pain   . Complication of anesthesia    hard to sedate dr hung aware  . Crohn's disease ([place])   . Diabetes mellitus without complication ([place])   . Fracture 05/01/2013   Right foot, in cast  . History of blood transfusion 2 years ago  . Hyperlipidemia   . Hypothyroidism    parathroid runs low  . Meningioma ([place])   . Panic disorder   . PONV (postoperative nausea and vomiting)   . Renal disorder    S/p '11-Kidney transplant-Dr. Florene Glen follows    Patient Active Problem List   Diagnosis Date Noted  . UTI (lower urinary tract infection) 11/20/2013  . CKD (chronic kidney disease) stage 3, GFR 30-59 ml/min  08/25/2013  . Perirectal abscess 08/24/2013  . ESRD (end stage renal disease) ([place]) 03/27/2011  . Kidney transplant status, living unrelated donor 03/27/2011  . Anemia 03/27/2011  . Type 2 diabetes mellitus ([place]) 03/27/2011  . Osteoarthritis of right knee 03/27/2011  . Lumbago 03/27/2011  . HTN (hypertension) 03/27/2011  . HLD (hyperlipidemia) 03/27/2011  . Generalized anxiety disorder 03/27/2011  . Depression 03/27/2011  . Chronic steroid use 03/27/2011  . Crohn's disease ([place]) 03/27/2011  . OCD (obsessive compulsive disorder) 03/27/2011  . 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 Obstructive sleep apnea, adult 03/27/2011    Past Surgical History:  Procedure Laterality Date  . ABDOMINAL HYSTERECTOMY  02/13/2012   Procedure: HYSTERECTOMY ABDOMINAL;  Surgeon: Cyril Mourning, MD;  Location: [facility];  Service: Gynecology;  Laterality: N/A;  . BRAVO [place] STUDY N/A 11/27/2013   Procedure: BRAVO [place];  Surgeon: Beryle Beams, MD;  [facility];  Service: Endoscopy;  Laterality: N/A;  . BRAVO [place] STUDY N/A 12/11/2013   Procedure: BRAVO [place];  Surgeon: Beryle Beams, MD;  [facility];  Service: Endoscopy;  Laterality: N/A;  . breast lumpectomy and reconstruction Bilateral 2011  . BREAST SURGERY     bilateral lumpectomy 4'11  . COLONOSCOPY N/A 09/25/2013   Procedure: COLONOSCOPY;  Surgeon: Beryle Beams, MD;  Location[facility];  Service: Endoscopy;  Laterality: N/A;  . COLONOSCOPY WITH PROPOFOL N/A 07/29/2015   Procedure: COLONOSCOPY WITH PROPOFOL;  Surgeon: Carol Ada, MD;  [facility];  Service: Endoscopy;  Laterality: N/A;  . CYSTOSCOPY  02/13/2012   Procedure: CYSTOSCOPY;  Surgeon: Franchot Gallo, MD;  Location: [facility];  Service: Urology;  Laterality: N/A;  insertion of ureteral catheter , removed per Dr Diona Fanti   . ESOPHAGEAL MANOMETRY N/A 08/10/2013   Procedure: ESOPHAGEAL MANOMETRY (EM);  Surgeon: Beryle Beams, MD;  [facility];  Service: Endoscopy;  Laterality: N/A;  . ESOPHAGOGASTRODUODENOSCOPY N/A 09/25/2013   Procedure: ESOPHAGOGASTRODUODENOSCOPY (EGD);  Surgeon: Beryle Beams, MD;  Location: [facility];  Service: Endoscopy;  Laterality: N/A;  . ESOPHAGOGASTRODUODENOSCOPY N/A 11/27/2013   Procedure: ESOPHAGOGASTRODUODENOSCOPY (EGD);  Surgeon: Beryle Beams, MD;  Location: [facility];  Service: Endoscopy;  Laterality: N/A;  . ESOPHAGOGASTRODUODENOSCOPY N/A 12/11/2013   Procedure: ESOPHAGOGASTRODUODENOSCOPY (EGD);  Surgeon: Beryle Beams, MD;  Location: [facility];  Service: Endoscopy;  Laterality: N/A;  . GROIN DISSECTION Left 10/09/2013   Procedure: INCISION AND DRAINAGE OF LEFT GROIN ABSCESS;  Surgeon: Shann Medal, MD;  [facility];  Service: General;  Laterality: Left;  . INCISION AND DRAINAGE ABSCESS N/A 08/26/2013   Procedure: INCISION AND DRAINAGE PERIRECTAL ABSCESS;  Surgeon: Jamesetta So, MD;  Location: [facility];  Service: General;  Laterality: N/A;  . KNEE SURGERY Left april 2003   arthroscopy  . LOWER EXTREMITY ARTERIAL DOPLLERS  05/06/2012   Bilat ABIs were attempted-not accessible due to calcified veins. Bilat TBIs demonstrated no obtainable flow through both great toes. Bilat PVR ankle regions demonstrated moderately abnormal pulsatile flow. Lft SFA demonstrated a 50-69% diameter reduction. Lft runoff demonstrated occlusive diseasewith reconstitution of flow noted distally.  Marland Kitchen [place] and 2011   last '11-[facility]  . PARATHYROIDECTOMY  02-20-2001  . SLEEVE GASTROPLASTY     for GERD, hiatal hernia  . TUBAL LIGATION  02-19-2000    OB History    Gravida Para Term Preterm AB Living             0   SAB TAB Ectopic Multiple Live Births                   Home Medications    Prior to Admission medications   Medication Sig Start Date End Date Taking? Authorizing Provider  ALPRAZolam Duanne Moron) 0.5 MG tablet Take 0.25-0.5 mg by mouth 3 (three) times daily  as needed for anxiety or sleep.    Yes Historical Provider, MD  calcitRIOL (ROCALTROL) 0.25 MCG capsule Take 1 mcg by mouth 2 (two) times daily.    Yes Niel Hummer, NP  calcium carbonate (TUMS EX) 750 MG chewable tablet Chew 1 tablet by mouth 3 (three) times daily.   Yes Historical Provider, MD  cetirizine (ZYRTEC) 10 MG tablet Take 10 mg by mouth at bedtime.    Yes Niel Hummer, NP  cyclobenzaprine (FLEXERIL) 10 MG tablet Take 10 mg by mouth 3 (three) times daily as needed for muscle spasms.   Yes Historical Provider, MD  fluocinonide cream (LIDEX) 3.32 % Apply 1 application topically daily as needed (for irritation).    Yes Historical Provider, MD  FLUoxetine (PROZAC) 20 MG capsule Take 60 mg by mouth daily.   Yes Historical Provider, MD  hydrOXYzine (ATARAX/VISTARIL) 50 MG tablet Take 50 mg by mouth at bedtime.  Yes Historical Provider, MD  linaclotide (LINZESS) 145 MCG CAPS capsule Take 145 mcg by mouth daily as needed (for constipation).   Yes Historical Provider, MD  metFORMIN (GLUCOPHAGE-XR) 500 MG 24 hr tablet Take 500 mg by mouth 2 (two) times daily.   Yes Historical Provider, MD  mycophenolate (MYFORTIC) 180 MG EC tablet Take 540 mg by mouth 2 (two) times daily.    Yes Niel Hummer, NP  omeprazole (PRILOSEC) 40 MG capsule Take 40 mg by mouth 2 (two) times daily.    Yes Historical Provider, MD  ondansetron (ZOFRAN) 8 MG tablet Take 16 mg by mouth every 8 (eight) hours as needed for nausea or vomiting.   Yes Historical Provider, MD  oxyCODONE (OXY IR/ROXICODONE) 5 MG immediate release tablet Take 5 mg by mouth at bedtime as needed for severe pain.   Yes Historical Provider, MD  predniSONE (DELTASONE) 10 MG tablet Take 10 mg by mouth daily with breakfast.    Yes Historical Provider, MD  promethazine (PHENERGAN) 50 MG tablet Take 50 mg by mouth every 6 (six) hours as needed for nausea or vomiting.   Yes Historical Provider, MD  rosuvastatin (CRESTOR) 5 MG tablet Take 5 mg by mouth at  bedtime.   Yes Historical Provider, MD  tacrolimus (PROGRAF) 1 MG capsule Take 3 mg by mouth 2 (two) times daily.   Yes Historical Provider, MD  Vitamin D, Ergocalciferol, (DRISDOL) 50000 units CAPS capsule Take 50,000 Units by mouth every Monday.    Yes Historical Provider, MD    Family History Family History  Problem Relation Age of Onset  . Diabetes Mother   . Hypertension Mother   . Diabetes Father   . Hypertension Father   . Cancer Father     Prostate cancer  . Obesity Sister   . Stroke Maternal Grandmother   . Heart attack Maternal Grandfather   . Polycystic ovary syndrome Sister     Social History Social History  Substance Use Topics  . Smoking status: Never Smoker  . Smokeless tobacco: Never Used  . Alcohol use No     Allergies   Barium-containing compounds; Dilaudid [hydromorphone hcl]; Ivp dye [iodinated diagnostic agents]; Shellfish allergy; Vancomycin; Adhesive [tape]; Infed [iron dextran]; Sulfa antibiotics; and Iopamidol   Review of Systems Review of Systems  Neurological: Positive for dizziness.  All other systems reviewed and are negative.    Physical Exam Updated Vital Signs BP 107/71   Pulse 82   Temp 98.5 F (36.9 C) (Oral)   Resp 20   Wt 190 lb (86.2 kg)   LMP 01/30/2012   SpO2 99%   BMI 33.66 kg/m   Physical Exam  Constitutional: She is oriented to person, place, and time. She appears well-developed and well-nourished.  HENT:  Head: Normocephalic and atraumatic.  Mouth/Throat: Oropharynx is clear and moist.  Cardiovascular: Normal rate and regular rhythm.   No murmur heard. Pulmonary/Chest: Effort normal and breath sounds normal. No respiratory distress.  Abdominal: Soft. There is no tenderness. There is no rebound and no guarding.  Mild abdominal distention with mild diffuse tenderness, no guarding or rebound  Musculoskeletal: She exhibits no edema or tenderness.  Neurological: She is alert and oriented to person, place, and time.    Skin: Skin is warm and dry.  Papular rash on face and upper neck. Few fading macules on the back and lower legs. No urticaria.  Psychiatric: She has a normal mood and affect. Her behavior is normal.  Nursing note and  vitals reviewed.    ED Treatments / Results  Labs (all labs ordered are listed, but only abnormal results are displayed) Labs Reviewed  BASIC METABOLIC PANEL  [place], ED    EKG  EKG Interpretation  Date/Time:  Thursday February 07 2017 14:44:21 EST Ventricular Rate:  78 PR Interval:    QRS Duration: 90 QT Interval:  406 QTC Calculation: 463 R Axis:   30 Text Interpretation:  Sinus rhythm Low voltage, extremity leads Confirmed by Hazle Coca [phone]) on 02/07/2017 3:26:01 PM       Radiology Dg Chest 2 View  Result Date: 02/07/2017 CLINICAL DATA:  Chest pain, shortness of Breath EXAM: CHEST  2 VIEW COMPARISON:  11/30/2016 FINDINGS: Cardiomediastinal silhouette is stable. No infiltrate or pleural effusion. No pulmonary edema. Mild elevation of the right hemidiaphragm again noted. Mild degenerative changes lower thoracic spine. IMPRESSION: No active cardiopulmonary disease. Electronically Signed   By: Lahoma Crocker M.D.   On: 02/07/2017 15:22    Procedures Procedures (including critical care time)  Medications Ordered in ED Medications  diphenhydrAMINE (BENADRYL) injection 25 mg (not administered)  ondansetron (ZOFRAN) injection 4 mg (not administered)     Initial Impression / Assessment and Plan / ED Course  I have reviewed the triage vital signs and the nursing notes.  Pertinent labs & imaging results that were available during my care of the patient were reviewed by me and considered in my medical decision making (see chart for details).     Patient with history of kidney disease status post renal transplant here with itchy rash to face as well as abdominal bloating and nausea. She is nontoxic on examination with no respiratory distress.  Patient care transferred pending labs and repeat assessment.  Final Clinical Impressions(s) / ED Diagnoses   Final diagnoses:  None    New Prescriptions New Prescriptions   No medications on file     Quintella Reichert, MD 02/07/17 1655

## 2017-02-07 NOTE — Discharge Instructions (Signed)
Take steroids and Benadryl follow up with PCP and follow up with allergist

## 2017-02-07 NOTE — ED Notes (Signed)
Spoke to patient advised her that urine was never collected or resulted.

## 2017-02-07 NOTE — ED Notes (Signed)
Attempted IV x2, unable to insert.  Another RN to attempt.

## 2017-02-07 NOTE — ED Notes (Signed)
Bed: WA07 Expected date:  Expected time:  Means of arrival:  Comments: Housekeeping

## 2017-04-09 ENCOUNTER — Ambulatory Visit: Payer: Managed Care, Other (non HMO) | Admitting: Allergy & Immunology

## 2017-04-11 ENCOUNTER — Encounter: Payer: Self-pay | Admitting: Allergy & Immunology

## 2017-04-11 ENCOUNTER — Ambulatory Visit (INDEPENDENT_AMBULATORY_CARE_PROVIDER_SITE_OTHER): Payer: Managed Care, Other (non HMO) | Admitting: Allergy & Immunology

## 2017-04-11 VITALS — BP 106/66 | HR 64 | Temp 98.6°F | Resp 16 | Ht 63.0 in | Wt 193.2 lb

## 2017-04-11 DIAGNOSIS — J4520 Mild intermittent asthma, uncomplicated: Secondary | ICD-10-CM | POA: Diagnosis not present

## 2017-04-11 DIAGNOSIS — T7840XD Allergy, unspecified, subsequent encounter: Secondary | ICD-10-CM

## 2017-04-11 DIAGNOSIS — J310 Chronic rhinitis: Secondary | ICD-10-CM | POA: Diagnosis not present

## 2017-04-11 MED ORDER — ALBUTEROL SULFATE HFA 108 (90 BASE) MCG/ACT IN AERS: 2.0000 | INHALATION_SPRAY | RESPIRATORY_TRACT | 1 refills | Status: AC | PRN

## 2017-04-11 NOTE — Progress Notes (Signed)
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 Mild intermittent asthma, uncomplicated - Lung function looked normal today. - We will send in a rescue inhaler to use for future shortness of breathing episodes.  - It does not seem that [name] needs a daily controller medication at this time.   2. Allergic reaction - unknown trigger - We will get some labs to rule out serious causes of idiopathic anaphylaxis: tryptase, chronic urticaria panel, inflammatory markers, and ANA - We will call you in 1-2 weeks with the results. - We will get some lab testing to look for the most common food allergies: seafood IgE, milk IgE, egg IgE, soy IgE, wheat IgE, nut panel IgE, corn IgE - In the meantime, I would recommend staying on antihistamines (Xyzal 5mg ) daily to suppress reactions. - We will call [name] in 1-2 weeks with the results. - EpiPen is up to date. - I recommended that [name] take a good history of exposures that might be triggering her reactions.  - I feel that many of her reactions could be related to her underlying anxiety, although she denies new triggers of her anxiety.   3. Chronic rhinitis - We will get blood testing to look for environmental allergens. - We will call [name] in 1-2 weeks with the results.  4. Return in about 6 months (around 10/11/2017).   Subjective:   [name] is a 41 y.o. female presenting today for evaluation of  Chief Complaint  Patient presents with  . Allergic Reaction    01/2017     [name] has a history of the  following: Patient Active Problem List   Diagnosis Date Noted  . UTI (lower urinary tract infection) 11/20/2013  . CKD (chronic kidney disease) stage 3, GFR 30-59 ml/min 08/25/2013  . Perirectal abscess 08/24/2013  . ESRD (end stage renal disease) ([place]) 03/27/2011  . Kidney transplant status, living unrelated donor 03/27/2011  . Anemia 03/27/2011  . Type 2 diabetes mellitus ([place]) 03/27/2011  . Osteoarthritis of right knee 03/27/2011  . Lumbago 03/27/2011  . HTN (hypertension) 03/27/2011  . HLD (hyperlipidemia) 03/27/2011  . Generalized anxiety disorder 03/27/2011  . Depression 03/27/2011  . Chronic steroid use 03/27/2011  . Crohn's disease ([place]) 03/27/2011  . OCD (obsessive compulsive disorder) 03/27/2011  . 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 Obstructive sleep apnea, adult 03/27/2011    History obtained from: chart review and patient.  [name] was referred by Aura Dials, PA-C.     Transplant surgeon: Dr. Zella Richer at [facility] Dermatologist: Dr. Camillo Flaming Gastroenterologist: Dr. Carol Ada Gynceologist: [name] is a 41 y.o. female presenting for an allergic reaction. It has happened on multiple occasions. It happens when she smells certain scents. The last couple of episodes occur at school. 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 There is a lot of mold and dust. During the worst reaction in February, she was talking to a student about her grits, and she thought that  the smell. Symptoms start with having shortness of breath, leading to panic with nausea and dizziness. She developed hives immediately. She reports shortness of breath when she was driving. She drove herself to the ED and was treated with Benadryl and IV steroids.   Her first reaction 2-3 months ago outside of [employer] in [facility]. She took some  benadryl with improvement of symptoms over the course of less than one hour. She was started on metformin one week before the most recent episode, however she was having episodes before the metformin was started. The reactions usually trigger the anxiety rather than the other way around. She uses Prozac in the morning and uses Xanax at night. She also uses Vistaril during this time of the year.    Asthma/Respiratory Symptom History: She has a long history of asthma, and last was on a daily medication 8-10 years ago. At that time, she was on Advair. She has not needed any medications in several years. She has been admitted for hypoxia around 15 years ago, otherwise no hospitalizations. She does endorses nighttime coughing around two nights per week. She does have a history of reflux but feels that this is related to her asthma.   Allergic Rhinitis Symptom History: She does endorse year round symptoms. She takes Benadryl as needed. She does not have a nose spray that she uses routinely. She does have eye drops but never uses them. She has never been allergy tested.   Food Allergy Symptom History: She does eat peanut butter but not often. She does not eat eggs although she will rarely eat boiled eggs. She does not eat wheat regularly. She will sometimes eat seafood but the smells cause her to feel that there is "something under [her] tongue". She does not drink milk or eat dairy containing products because they "look wrong". She does not eat corn. 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 Otherwise, there is no history of other atopic diseases, including drug allergies, food allergies, stinging insect allergies, or urticaria. There is no significant infectious history. Vaccinations are up to date.    Past Medical History: Patient Active Problem List   Diagnosis Date Noted  . UTI  (lower urinary tract infection) 11/20/2013  . CKD (chronic kidney disease) stage 3, GFR 30-59 ml/min 08/25/2013  . Perirectal abscess 08/24/2013  . ESRD (end stage renal disease) ([place]) 03/27/2011  . Kidney transplant status, living unrelated donor 03/27/2011  . Anemia 03/27/2011  . Type 2 diabetes mellitus ([place]) 03/27/2011  . Osteoarthritis of right knee 03/27/2011  . Lumbago 03/27/2011  . HTN (hypertension) 03/27/2011  . HLD (hyperlipidemia) 03/27/2011  . Generalized anxiety disorder 03/27/2011  . Depression 03/27/2011  . Chronic steroid use 03/27/2011  . Crohn's disease ([place]) 03/27/2011  . OCD (obsessive compulsive disorder) 03/27/2011  . 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 Obstructive sleep apnea, adult 03/27/2011    Medication List:  Allergies as of 04/11/2017      Reactions   Barium-containing Compounds Anaphylaxis   Dilaudid [hydromorphone Hcl] Anaphylaxis   Ivp Dye [iodinated Diagnostic Agents] Anaphylaxis   Shellfish Allergy Shortness Of Breath, Itching, Rash   Vancomycin Anaphylaxis   Adhesive [tape] Dermatitis   Infed [iron Dextran] Itching   Sulfa Antibiotics Hives   Iopamidol Itching, Rash      Medication List       Accurate  as of 04/11/17 10:03 PM. Always use your most recent med list.          albuterol 108 (90 Base) MCG/ACT inhaler Commonly known as:  PROAIR HFA Inhale 2 puffs into the lungs every 4 (four) hours as needed for wheezing or shortness of breath.   ALPRAZolam 0.5 MG tablet Commonly known as:  XANAX Take 0.25-0.5 mg by mouth 3 (three) times daily as needed for anxiety or sleep.   cyclobenzaprine 10 MG tablet Commonly known as:  FLEXERIL Take 10 mg by mouth 3 (three) times daily as needed for muscle spasms.   diphenhydrAMINE 25 MG tablet Commonly known as:  BENADRYL Take 1 tablet (25 mg total) by mouth every 6 (six) hours.   EPINEPHrine 0.3 mg/0.3 mL [place] injection Commonly known as:  EPI-PEN Use as directed, if administer  call 911   fluocinonide cream 0.05 % Commonly known as:  LIDEX Apply 1 application topically daily as needed (for irritation).   hydrOXYzine 50 MG tablet Commonly known as:  ATARAX/VISTARIL Take 50 mg by mouth at bedtime.   LINZESS 145 MCG Caps capsule Generic drug:  linaclotide Take 145 mcg by mouth daily as needed (for constipation).   loratadine 10 MG tablet Commonly known as:  CLARITIN Take 10 mg by mouth daily.   metFORMIN 500 MG 24 hr tablet Commonly known as:  GLUCOPHAGE-XR Take 500 mg by mouth 2 (two) times daily.   mycophenolate 180 MG EC tablet Commonly known as:  MYFORTIC Take 540 mg by mouth 2 (two) times daily.   omeprazole 40 MG capsule Commonly known as:  PRILOSEC Take 40 mg by mouth 2 (two) times daily.   oxyCODONE 5 MG immediate release tablet Commonly known as:  Oxy IR/ROXICODONE Take 5 mg by mouth at bedtime as needed for severe pain.   predniSONE 10 MG tablet Commonly known as:  DELTASONE Take 10 mg by mouth daily with breakfast.   promethazine 50 MG tablet Commonly known as:  PHENERGAN Take 50 mg by mouth every 6 (six) hours as needed for nausea or vomiting.   PROZAC 20 MG capsule Generic drug:  FLUoxetine Take 60 mg by mouth daily.   rosuvastatin 5 MG tablet Commonly known as:  CRESTOR Take 5 mg by mouth at bedtime.   tacrolimus 1 MG capsule Commonly known as:  PROGRAF Take 3 mg by mouth 2 (two) times daily.   Vitamin D (Ergocalciferol) 50000 units Caps capsule Commonly known as:  DRISDOL Take 50,000 Units by mouth every Monday.       Birth History: non-contributory.   Developmental History: non-contributory.   Past Surgical History: Past Surgical History:  Procedure Laterality Date  . ABDOMINAL HYSTERECTOMY  02/13/2012   Procedure: HYSTERECTOMY ABDOMINAL;  Surgeon: Cyril Mourning, MD;  Location: [facility];  Service: Gynecology;  Laterality: N/A;  . BRAVO [place] STUDY N/A 11/27/2013   Procedure: BRAVO [place];  Surgeon: Beryle Beams, MD;  Location: [facility];  Service: Endoscopy;  Laterality: N/A;  . BRAVO [place] STUDY N/A 12/11/2013   Procedure: BRAVO [place];  Surgeon: Beryle Beams, MD;  Location: [facility];  Service: Endoscopy;  Laterality: N/A;  . breast lumpectomy and reconstruction Bilateral 2011  . BREAST SURGERY     bilateral lumpectomy 4'11  . COLONOSCOPY N/A 09/25/2013   Procedure: COLONOSCOPY;  Surgeon: Beryle Beams, MD;  Location: [facility];  Service: Endoscopy;  Laterality: N/A;  . COLONOSCOPY WITH PROPOFOL N/A 07/29/2015   Procedure: COLONOSCOPY WITH PROPOFOL;  Surgeon: Carol Ada, MD;  Location: [facility];  Service: Endoscopy;  Laterality: N/A;  . CYSTOSCOPY  02/13/2012   Procedure: CYSTOSCOPY;  Surgeon: Franchot Gallo, MD;  Location: [facility];  Service: Urology;  Laterality: N/A;  insertion of ureteral catheter , removed per Dr Diona Fanti   . ESOPHAGEAL MANOMETRY N/A 08/10/2013   Procedure: ESOPHAGEAL MANOMETRY (EM);  Surgeon: Beryle Beams, MD;  [facility]: [facility];  Service: Endoscopy;  Laterality: N/A;  . ESOPHAGOGASTRODUODENOSCOPY N/A 09/25/2013   Procedure: ESOPHAGOGASTRODUODENOSCOPY (EGD);  Surgeon: Beryle Beams, MD;  Location: [facility];  Service: Endoscopy;  Laterality: N/A;  . ESOPHAGOGASTRODUODENOSCOPY N/A 11/27/2013   Procedure: ESOPHAGOGASTRODUODENOSCOPY (EGD);  Surgeon: Beryle Beams, MD;  Location: [facility];  Service: Endoscopy;  Laterality: N/A;  . ESOPHAGOGASTRODUODENOSCOPY N/A 12/11/2013   Procedure: ESOPHAGOGASTRODUODENOSCOPY (EGD);  Surgeon: Beryle Beams, MD;  Location: [facility];  Service: Endoscopy;  Laterality: N/A;  . GROIN DISSECTION Left 10/09/2013   Procedure: INCISION AND DRAINAGE OF LEFT GROIN ABSCESS;  Surgeon: Shann Medal, MD;  Location: [facility];  Service: General;  Laterality: Left;  . INCISION AND DRAINAGE ABSCESS N/A 08/26/2013   Procedure: INCISION AND DRAINAGE PERIRECTAL ABSCESS;  Surgeon: Jamesetta So, MD;  Location: [facility];  Service:  General;  Laterality: N/A;  . KNEE SURGERY Left april 2003   arthroscopy  . LOWER EXTREMITY ARTERIAL DOPLLERS  05/06/2012   Bilat ABIs were attempted-not accessible due to calcified veins. Bilat TBIs demonstrated no obtainable flow through both great toes. Bilat PVR ankle regions demonstrated moderately abnormal pulsatile flow. Lft SFA demonstrated a 50-69% diameter reduction. Lft runoff demonstrated occlusive diseasewith reconstitution of flow noted distally.  Mar[facility] [place] and 2011   last '11-[facility]  . PARATHYROIDECTOMY  02-20-2001  . SLEEVE GASTROPLASTY     for GERD, hiatal hernia  . TUBAL LIGATION  02-19-2000     Family History: Family History  Problem Relation Age of Onset  . Diabetes Mother   . Hypertension Mother   . Asthma Mother   . Diabetes Father   . Hypertension Father   . Cancer Father     Prostate cancer  . Asthma Father   . Obesity Sister   . Eczema Sister   . Stroke Maternal Grandmother   . Heart attack Maternal Grandfather   . Polycystic ovary syndrome Sister      Social History: Arelyn lives at home with her husband. She is a Education officer, museum. There are dogs at home. There is not tobacco exposure.      Review of Systems: a 14-point review of systems is pertinent for what is mentioned in HPI.  Otherwise, all other systems were negative. Constitutional: negative other than that listed in the HPI Eyes: negative other than that listed in the HPI Ears, nose, mouth, throat, and face: negative other than that listed in the HPI Respiratory: negative other than that listed in the HPI Cardiovascular: negative other than that listed in the HPI Gastrointestinal: negative other than that listed in the HPI Genitourinary: negative other than that listed in the HPI Integument: negative other than that listed in the HPI Hematologic: negative other than that listed in the HPI Musculoskeletal: negative other than that listed in the HPI Neurological:  negative other than that listed in the HPI Allergy/Immunologic: negative other than that listed in the HPI    Objective:   Blood pressure 106/66, pulse 64, temperature 98.6 F (37 C), temperature source Oral, resp. rate 16, height 5\' 3"  (  1.6 m), weight 193 lb 3.2 oz (87.6 kg), last menstrual period 01/30/2012. Body mass index is 34.22 kg/m.   Physical Exam:  General: Alert, interactive, in no acute distress. Pleasant female but somewhat overly anxious. Eyes: No conjunctival injection present on the right, No conjunctival injection present on the left, PERRL bilaterally, No discharge on the right, No discharge on the left and No Horner-Trantas dots present Ears: Right TM pearly gray with normal light reflex, Left TM pearly gray with normal light reflex, Right TM intact without perforation and Left TM intact without perforation.  Nose/Throat: External nose within normal limits and septum midline, turbinates mildly edematous with clear discharge, post-pharynx mildly erythematous without cobblestoning in the posterior oropharynx. Tonsils 2+ without exudates Neck: Supple without thyromegaly.  Adenopathy: no enlarged lymph nodes appreciated in the anterior cervical, occipital, axillary, epitrochlear, inguinal, or popliteal regions Lungs: Clear to auscultation without wheezing, rhonchi or rales. No increased work of breathing. CV: Normal S1/S2, no murmurs. Capillary refill <2 seconds.  Abdomen: Nondistended, nontender. No guarding or rebound tenderness. Bowel sounds absent, faint, present in all fields, hypoactive and hyperactive  Skin: Warm and dry, without lesions or rashes. Extremities:  No clubbing, cyanosis or edema. Neuro:   Grossly intact. No focal deficits appreciated. Responsive to questions.  Diagnostic studies:  Spirometry: results normal (FEV1: 2.20/96%, FVC: 2.39/88%, FEV1/FVC: 92%).    Spirometry consistent with normal pattern.   Allergy Studies: none (deferred since she took  Vistaril last night)      Salvatore Marvel, MD [facility]

## 2017-04-11 NOTE — Patient Instructions (Addendum)
1. Mild intermittent asthma, uncomplicated - Lung function looked normal today. - We will send in a rescue inhaler to use for future shortness of breathing episodes.  - It does not seem that you need a daily controller medication at this time.   2. Allergic reaction - unknown trigger - We will get some labs to rule out serious causes of idiopathic anaphylaxis: tryptase, chronic urticaria panel, inflammatory markers, and ANA - We will call you in 1-2 weeks with the results. - We will get some lab testing to look for the most common food allergies: seafood IgE, milk IgE, egg IgE, soy IgE, wheat IgE, nut panel IgE, corn IgE - In the meantime, I would recommend staying on antihistamines (Xyzal 5mg ) daily to suppress reactions. - We will call you in 1-2 weeks with the results. - EpiPen is up to date. - Continue to take a good history of exposures that might be triggering your reactions.   3. Chronic rhinitis - We will get blood testing to look for environmental allergens. - We will call you in 1-2 weeks with the results.   4. Return in about 6 months (around 10/11/2017).  Please inform us of any Emergency Department visits, hospitalizations, or changes in symptoms. Call us before going to the ED for breathing or allergy symptoms since we might be able to fit you in for a sick visit. Feel free to contact us anytime with any questions, problems, or concerns.  It was a pleasure to meet you today! Happy spring!   Websites that have reliable patient information: 1. American Academy of Asthma, Allergy, and Immunology: www.aaaai.org 2. Food Allergy Research and Education (FARE): foodallergy.org 3. Mothers of Asthmatics: http://www.asthmacommunitynetwork.org 4. American College of Allergy, Asthma, and Immunology: www.acaai.org

## 2017-04-25 ENCOUNTER — Emergency Department (HOSPITAL_COMMUNITY): Payer: Managed Care, Other (non HMO)

## 2017-04-25 ENCOUNTER — Emergency Department (HOSPITAL_COMMUNITY)
Admission: EM | Admit: 2017-04-25 | Discharge: 2017-04-25 | Disposition: A | Discharge status: Home / Self Care | Payer: Managed Care, Other (non HMO) | Attending: Emergency Medicine | Admitting: Emergency Medicine

## 2017-04-25 ENCOUNTER — Encounter (HOSPITAL_COMMUNITY): Payer: Self-pay | Admitting: Emergency Medicine

## 2017-04-25 DIAGNOSIS — K5090 Crohn's disease, unspecified, without complications: Secondary | ICD-10-CM | POA: Diagnosis not present

## 2017-04-25 DIAGNOSIS — J45909 Unspecified asthma, uncomplicated: Secondary | ICD-10-CM | POA: Insufficient documentation

## 2017-04-25 DIAGNOSIS — E119 Type 2 diabetes mellitus without complications: Secondary | ICD-10-CM | POA: Insufficient documentation

## 2017-04-25 DIAGNOSIS — N186 End stage renal disease: Secondary | ICD-10-CM | POA: Diagnosis not present

## 2017-04-25 DIAGNOSIS — Z853 Personal history of malignant neoplasm of breast: Secondary | ICD-10-CM | POA: Diagnosis not present

## 2017-04-25 DIAGNOSIS — Z7984 Long term (current) use of oral hypoglycemic drugs: Secondary | ICD-10-CM | POA: Diagnosis not present

## 2017-04-25 DIAGNOSIS — B349 Viral infection, unspecified: Secondary | ICD-10-CM | POA: Insufficient documentation

## 2017-04-25 DIAGNOSIS — R509 Fever, unspecified: Secondary | ICD-10-CM | POA: Diagnosis present

## 2017-04-25 DIAGNOSIS — E039 Hypothyroidism, unspecified: Secondary | ICD-10-CM | POA: Insufficient documentation

## 2017-04-25 DIAGNOSIS — I120 Hypertensive chronic kidney disease with stage 5 chronic kidney disease or end stage renal disease: Secondary | ICD-10-CM | POA: Diagnosis not present

## 2017-04-25 LAB — COMPREHENSIVE METABOLIC PANEL
ALT: 9 U/L — ABNORMAL LOW (ref 14–54)
AST: 16 U/L (ref 15–41)
Albumin: 4.3 g/dL (ref 3.5–5.0)
Alkaline Phosphatase: 53 U/L (ref 38–126)
Anion gap: 11 (ref 5–15)
BUN: 18 mg/dL (ref 6–20)
CO2: 23 mmol/L (ref 22–32)
Calcium: 9.3 mg/dL (ref 8.9–10.3)
Chloride: 104 mmol/L (ref 101–111)
Creatinine, Ser: 1.65 mg/dL — ABNORMAL HIGH (ref 0.44–1.00)
GFR calc Af Amer: 43 mL/min — ABNORMAL LOW (ref >60)
GFR calc non Af Amer: 37 mL/min — ABNORMAL LOW (ref >60)
Glucose, Bld: 144 mg/dL — ABNORMAL HIGH (ref 65–99)
Potassium: 4.5 mmol/L (ref 3.5–5.1)
Sodium: 138 mmol/L (ref 135–145)
Total Bilirubin: 0.5 mg/dL (ref 0.3–1.2)
Total Protein: 7.5 g/dL (ref 6.5–8.1)

## 2017-04-25 LAB — URINALYSIS, ROUTINE W REFLEX MICROSCOPIC
Bilirubin Urine: NEGATIVE
Glucose, UA: NEGATIVE mg/dL
Hgb urine dipstick: NEGATIVE
Ketones, ur: NEGATIVE mg/dL
Leukocytes, UA: NEGATIVE
Nitrite: NEGATIVE
Protein, ur: NEGATIVE mg/dL
Specific Gravity, Urine: 1.02 (ref 1.005–1.030)
pH: 5 (ref 5.0–8.0)

## 2017-04-25 LAB — CBC WITH DIFFERENTIAL/PLATELET
Basophils Absolute: 0 10*3/uL (ref 0.0–0.1)
Basophils Relative: 0 %
Eosinophils Absolute: 0 10*3/uL (ref 0.0–0.7)
Eosinophils Relative: 0 %
HCT: 35.6 % — ABNORMAL LOW (ref 36.0–46.0)
Hemoglobin: 11.4 g/dL — ABNORMAL LOW (ref 12.0–15.0)
Lymphocytes Relative: 12 %
Lymphs Abs: 0.9 10*3/uL (ref 0.7–4.0)
MCH: 26.8 pg (ref 26.0–34.0)
MCHC: 32 g/dL (ref 30.0–36.0)
MCV: 83.8 fL (ref 78.0–100.0)
Monocytes Absolute: 0.2 10*3/uL (ref 0.1–1.0)
Monocytes Relative: 2 %
Neutro Abs: 6.4 10*3/uL (ref 1.7–7.7)
Neutrophils Relative %: 86 %
Platelets: 251 10*3/uL (ref 150–400)
RBC: 4.25 MIL/uL (ref 3.87–5.11)
RDW: 13.5 % (ref 11.5–15.5)
WBC: 7.5 10*3/uL (ref 4.0–10.5)

## 2017-04-25 NOTE — ED Triage Notes (Signed)
Patient last had tylenol 11:30am today. Patient has hx of cancer. Not currently on chemo.

## 2017-04-25 NOTE — ED Provider Notes (Signed)
[facility] DEPT Provider Note   CSN: [id number] Arrival date & time: 04/25/17  1431     History   Chief Complaint Chief Complaint  Patient presents with  . Fever    HPI [name] is a 41 y.o. 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 She reports myalgias, chills, and fatigue that began 4 days ago. 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 Yesterday, she reports that she began to feel as if her BP was dropping. She reports  called her PCP and was advised to come to the ED for evaluation, but reports she didn't have a ride until late in the evening so she fell asleep and felt better in the morning. She reports she began having fevers yesterday, Tmax 100.4, that improved with Tylenol. She reports that she was seen by her PCP earlier today who advised her to come to the ED for evaluation. Upon arrival, she noted that she was short of breath after walking from the parking lot. She also c/o abdominal pain, but report a h/o of chronic abdominal pain, and the current pain is unchanged from her baseline. Denies arthralgias, HA, rash, cough, dysuria, leg swelling, CP, or URI-like symptoms including sore throat, sneezing, or rhinorrhea.  The history is provided by the patient. No language interpreter was used.    Past Medical History:  Diagnosis Date  . Anxiety    OCD  . Asthma    as a child  . Cancer [facility]) 2001   renal cell , breast cancer both breast 2011, cervical cancer 2013  . Chronic anemia   . Chronic pain   . Complication of anesthesia    hard to sedate dr hung aware  . Crohn's disease ([place])   . Diabetes mellitus without complication ([place])   . Fracture 05/01/2013   Right foot, in cast  . History of blood transfusion 2 years ago  . Hyperlipidemia   .  Hypothyroidism    parathroid runs low  . Meningioma ([place])   . Panic disorder   . PONV (postoperative nausea and vomiting)   . Renal disorder    S/p '11-Kidney transplant-Dr. Florene Glen follows  . Urticaria     Patient Active Problem List   Diagnosis Date Noted  . UTI (lower urinary tract infection) 11/20/2013  . CKD (chronic kidney disease) stage 3, GFR 30-59 ml/min 08/25/2013  . Perirectal abscess 08/24/2013  . ESRD (end stage renal disease) ([place]) 03/27/2011  . Kidney transplant status, living unrelated donor 03/27/2011  . Anemia 03/27/2011  . Type 2 diabetes mellitus ([place]) 03/27/2011  . Osteoarthritis of right knee 03/27/2011  . Lumbago 03/27/2011  . HTN (hypertension) 03/27/2011  . HLD (hyperlipidemia) 03/27/2011  . Generalized anxiety disorder 03/27/2011  . Depression 03/27/2011  . Chronic steroid use 03/27/2011  . Crohn's disease ([place]) 03/27/2011  . OCD (obsessive compulsive disorder) 03/27/2011  . 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 Obstructive sleep apnea, adult 03/27/2011    Past Surgical History:  Procedure Laterality Date  . ABDOMINAL HYSTERECTOMY  02/13/2012   Procedure: HYSTERECTOMY ABDOMINAL;  Surgeon: Cyril Mourning, MD;  Location: [facility];  Service: Gynecology;  Laterality: N/A;  . BRAVO [place] STUDY N/A 11/27/2013   Procedure: BRAVO [place];  Surgeon: Beryle Beams, MD;  Location: [facility];  Service: Endoscopy;  Laterality: N/A;  . BRAVO [place] STUDY N/A 12/11/2013   Procedure: BRAVO [place];  Surgeon: Beryle Beams, MD;  Location: [facility];  Service: Endoscopy;  Laterality: N/A;  . breast lumpectomy and reconstruction Bilateral 2011  . BREAST SURGERY     bilateral lumpectomy 4'11  . COLONOSCOPY N/A 09/25/2013   Procedure: COLONOSCOPY;  Surgeon: Beryle Beams, MD;  Location: [facility];  Service: Endoscopy;  Laterality: N/A;  . COLONOSCOPY WITH PROPOFOL N/A 07/29/2015   Procedure: COLONOSCOPY WITH PROPOFOL;  Surgeon: Carol Ada, MD;  Location:  [facility];  Service: Endoscopy;  Laterality: N/A;  . CYSTOSCOPY  02/13/2012   Procedure: CYSTOSCOPY;  Surgeon: Franchot Gallo, MD;  Location: [facility];  Service: Urology;  Laterality: N/A;  insertion of ureteral catheter , removed per Dr Diona Fanti   . ESOPHAGEAL MANOMETRY N/A 08/10/2013   Procedure: ESOPHAGEAL MANOMETRY (EM);  Surgeon: Beryle Beams, MD;  Location: [facility];  Service: Endoscopy;  Laterality: N/A;  . ESOPHAGOGASTRODUODENOSCOPY N/A 09/25/2013   Procedure: ESOPHAGOGASTRODUODENOSCOPY (EGD);  Surgeon: Beryle Beams, MD;  Location: [facility];  Service: Endoscopy;  Laterality: N/A;  . ESOPHAGOGASTRODUODENOSCOPY N/A 11/27/2013   Procedure: ESOPHAGOGASTRODUODENOSCOPY (EGD);  Surgeon: Beryle Beams, MD;  Location: [facility];  Service: Endoscopy;  Laterality: N/A;  . ESOPHAGOGASTRODUODENOSCOPY N/A 12/11/2013   Procedure: ESOPHAGOGASTRODUODENOSCOPY (EGD);  Surgeon: Beryle Beams, MD;  Location: [facility];  Service: Endoscopy;  Laterality: N/A;  . GROIN DISSECTION Left 10/09/2013   Procedure: INCISION AND DRAINAGE OF LEFT GROIN ABSCESS;  Surgeon: Shann Medal, MD;  Location: [facility];  Service: General;  Laterality: Left;  . INCISION AND DRAINAGE ABSCESS N/A 08/26/2013   Procedure: INCISION AND DRAINAGE PERIRECTAL ABSCESS;  Surgeon: Jamesetta So, MD;  Location: [facility];  Service: General;  Laterality: N/A;  . KNEE SURGERY Left april 2003   arthroscopy  . LOWER EXTREMITY ARTERIAL DOPLLERS  05/06/2012   Bilat ABIs were attempted-not accessible due to calcified veins. Bilat TBIs demonstrated no obtainable flow through both great toes. Bilat PVR ankle regions demonstrated moderately abnormal pulsatile flow. Lft SFA demonstrated a 50-69% diameter reduction. Lft runoff demonstrated occlusive diseasewith reconstitution of flow noted distally.  Marland Kitchen [place] and 2011   last '11-[facility]  . PARATHYROIDECTOMY  02-20-2001  . SLEEVE GASTROPLASTY     for GERD,  hiatal hernia  . TUBAL LIGATION  02-19-2000    OB History    Gravida Para Term Preterm AB Living             0   SAB TAB Ectopic Multiple Live Births                   Home Medications    Prior to Admission medications   Medication Sig Start Date End Date Taking? Authorizing Provider  ALPRAZolam Duanne Moron) 0.5 MG tablet Take 0.25-0.5 mg by mouth 3 (three) times daily as needed for anxiety or sleep.    Yes [provider]  EPINEPHrine 0.3 mg/0.3 mL IJ SOAJ injection Use as directed, if administer call 911 02/08/17  Yes [provider]  fluocinonide cream (LIDEX) 9.50 % Apply 1 application topically daily as needed (for irritation).    Yes [provider]  FLUoxetine (PROZAC) 20 MG capsule Take 60 mg by mouth daily.   Yes [provider]  hydrOXYzine (ATARAX/VISTARIL) 50 MG tablet Take 50 mg by mouth at bedtime.    Yes [provider]  linaclotide Rolan Lipa) 145  MCG CAPS capsule Take 145 mcg by mouth daily as needed (for constipation).   Yes [provider]  loratadine (CLARITIN) 10 MG tablet Take 10 mg by mouth daily.   Yes [provider]  metFORMIN (GLUCOPHAGE-XR) 500 MG 24 hr tablet Take 500 mg by mouth 2 (two) times daily.   Yes [provider]  mycophenolate (MYFORTIC) 180 MG EC tablet Take 540 mg by mouth 2 (two) times daily.    Yes Niel Hummer, NP  omeprazole (PRILOSEC) 40 MG capsule Take 40 mg by mouth 2 (two) times daily.    Yes [provider]  oxyCODONE (OXY IR/ROXICODONE) 5 MG immediate release tablet Take 5 mg by mouth at bedtime as needed for severe pain.   Yes [provider]  predniSONE (DELTASONE) 10 MG tablet Take 10 mg by mouth daily with breakfast.    Yes [provider]  promethazine (PHENERGAN) 50 MG tablet Take 50 mg by mouth every 6 (six) hours as needed for nausea or vomiting.   Yes [provider]  rosuvastatin (CRESTOR) 5 MG tablet Take 5 mg by mouth at  bedtime.   Yes [provider]  tacrolimus (PROGRAF) 1 MG capsule Take 3 mg by mouth 2 (two) times daily.   Yes [provider]  Vitamin D, Ergocalciferol, (DRISDOL) 50000 units CAPS capsule Take 50,000 Units by mouth every Monday.    Yes [provider]  albuterol (PROAIR HFA) 108 (90 Base) MCG/ACT inhaler Inhale 2 puffs into the lungs every 4 (four) hours as needed for wheezing or shortness of breath. Patient not taking: Reported on 04/25/2017 04/11/17   Valentina Shaggy, MD  diphenhydrAMINE (BENADRYL) 25 MG tablet Take 1 tablet (25 mg total) by mouth every 6 (six) hours. Patient not taking: Reported on 04/25/2017 02/07/17   Macarthur Critchley, MD    Family History Family History  Problem Relation Age of Onset  . Diabetes Mother   . Hypertension Mother   . Asthma Mother   . Diabetes Father   . Hypertension Father   . Cancer Father     Prostate cancer  . Asthma Father   . Obesity Sister   . Eczema Sister   . Stroke Maternal Grandmother   . Heart attack Maternal Grandfather   . Polycystic ovary syndrome Sister     Social History Social History  Substance Use Topics  . Smoking status: Never Smoker  . Smokeless tobacco: Never Used  . Alcohol use No     Allergies   Barium-containing compounds; Dilaudid [hydromorphone hcl]; Ivp dye [iodinated diagnostic agents]; Shellfish allergy; Vancomycin; Adhesive [tape]; Infed [iron dextran]; Sulfa antibiotics; and Iopamidol   Review of Systems Review of Systems  Constitutional: Positive for chills, fatigue and fever. Negative for activity change.  HENT: Negative for rhinorrhea, sneezing and sore throat.   Respiratory: Negative for cough.   Cardiovascular: Negative for chest pain.  Gastrointestinal: Positive for abdominal pain (chronic).  Genitourinary: Negative for dysuria.  Musculoskeletal: Positive for myalgias. Negative for arthralgias.  Skin: Negative for rash.  Allergic/Immunologic: Positive for  immunocompromised state.  Neurological: Negative for headaches.  Psychiatric/Behavioral: Negative for confusion.     Physical Exam Updated Vital Signs BP 92/69 (BP Location: Left Arm)   Pulse 71   Temp 98.5 F (36.9 C) (Oral)   Resp 18   Ht 5\' 3"  (1.6 m)   Wt 85.7 kg   LMP 01/30/2012   SpO2 99%   BMI 33.48 kg/m   Physical Exam  Constitutional: She is oriented to person, place, and time. She appears well-developed and well-nourished. No distress.  HENT:  Head: Normocephalic and atraumatic.  Right Ear: Tympanic membrane normal.  Left Ear: Tympanic membrane normal.  Nose: Nose normal.  Mouth/Throat: Uvula is midline, oropharynx is clear and moist and mucous membranes are normal.  Eyes: Conjunctivae are normal.  Neck: Neck supple.  Cardiovascular: Normal rate, regular rhythm and normal heart sounds.  Exam reveals no gallop and no friction rub.   No murmur heard. Pulmonary/Chest: Effort normal and breath sounds normal. No respiratory distress. She has no wheezes. She has no rales.  Abdominal: Soft. She exhibits no distension. There is no tenderness. There is no guarding.  Musculoskeletal: Normal range of motion. She exhibits no edema or tenderness.  Neurological: She is alert and oriented to person, place, and time.  Skin: Skin is warm and dry. Capillary refill takes less than 2 seconds. No rash noted. She is not diaphoretic.  Psychiatric: Her behavior is normal.  Nursing note and vitals reviewed.    ED Treatments / Results  Labs (all labs ordered are listed, but only abnormal results are displayed) Labs Reviewed  CBC WITH DIFFERENTIAL/PLATELET - Abnormal; Notable for the following:       Result Value   Hemoglobin 11.4 (*)    HCT 35.6 (*)    All other components within normal limits  URINALYSIS, ROUTINE W REFLEX MICROSCOPIC - Abnormal; Notable for the following:    APPearance HAZY (*)    All other components within normal limits  COMPREHENSIVE METABOLIC PANEL -  Abnormal; Notable for the following:    Glucose, Bld 144 (*)    Creatinine, Ser 1.65 (*)    ALT 9 (*)    GFR calc non Af Amer 37 (*)    GFR calc Af Amer 43 (*)    All other components within normal limits    EKG  EKG Interpretation None       Radiology No results found.  Procedures Procedures (including critical care time)  Medications Ordered in ED Medications - No data to display   Initial Impression / Assessment and Plan / ED Course  I have reviewed the triage vital signs and the nursing notes.  Pertinent labs & imaging results that were available during my care of the patient were reviewed by me and considered in my medical decision making (see chart for details).     41 year old female with h/o of bilateral kidney transplants on prednisone and tacrolimus and h/o of uterine, kidney, and breast all s/p surgical removal who presents to the ED with viral symptoms. Tmax 100.4 at home. Afebrile in the ED. VSS; no tachycardia, or tachypnea. Normotensive. NAD> PE unremarkable. Baseline creatinine appear to be 1.40-1.60 based on previous workups. UA without evidence of infection. CBC and CMP unremarkable. No concern for sepsis at this time.  Patient does not appear dehydrated. After obtaining a history and physical exam, I do not feel further emergent work up is necessary at this time. Discussed the patient with Dr. Eulis Foster, attending physician. Will discharge the patient to home with follow up to PCP. Can treat fever with Tylenol. Discussed return precautions to the ED.   Final Clinical Impressions(s) / ED Diagnoses   Final diagnoses:  Viral illness    New Prescriptions Discharge Medication List as of 04/25/2017  8:00 PM       Xzavier Swinger, [place], PA-C 04/27/17 2028    Daleen Bo, MD 05/01/17 [phone]

## 2017-04-25 NOTE — ED Notes (Signed)
Patient transported to X-ray 

## 2017-04-25 NOTE — Discharge Instructions (Signed)
Please take Flexeril as prescribed for your back pain. If symptoms persist for more than 5-7 days, please follow up with your primary care provider for re-evaluation. If you develop new symptoms, including a high fever, please return to the Emergency Department for re-evaluation.

## 2017-04-25 NOTE — ED Triage Notes (Signed)
Pt being sent from dr's office due to body aches, abd pain chills, and joint pain. Pt negative for strept and flu tests. Hx of cancer, organ transplant, CKD, and blood transfusion.

## 2017-06-11 ENCOUNTER — Encounter (HOSPITAL_COMMUNITY): Payer: Self-pay | Admitting: Emergency Medicine

## 2017-06-11 ENCOUNTER — Emergency Department (HOSPITAL_COMMUNITY)
Admission: EM | Admit: 2017-06-11 | Discharge: 2017-06-11 | Disposition: A | Discharge status: Home / Self Care | Payer: Managed Care, Other (non HMO) | Attending: Emergency Medicine | Admitting: Emergency Medicine

## 2017-06-11 DIAGNOSIS — Z940 Kidney transplant status: Secondary | ICD-10-CM

## 2017-06-11 DIAGNOSIS — I120 Hypertensive chronic kidney disease with stage 5 chronic kidney disease or end stage renal disease: Secondary | ICD-10-CM | POA: Diagnosis not present

## 2017-06-11 DIAGNOSIS — Z8541 Personal history of malignant neoplasm of cervix uteri: Secondary | ICD-10-CM | POA: Diagnosis not present

## 2017-06-11 DIAGNOSIS — E1122 Type 2 diabetes mellitus with diabetic chronic kidney disease: Secondary | ICD-10-CM | POA: Diagnosis not present

## 2017-06-11 DIAGNOSIS — J45909 Unspecified asthma, uncomplicated: Secondary | ICD-10-CM | POA: Insufficient documentation

## 2017-06-11 DIAGNOSIS — R112 Nausea with vomiting, unspecified: Secondary | ICD-10-CM | POA: Diagnosis not present

## 2017-06-11 DIAGNOSIS — E039 Hypothyroidism, unspecified: Secondary | ICD-10-CM | POA: Diagnosis not present

## 2017-06-11 DIAGNOSIS — D649 Anemia, unspecified: Secondary | ICD-10-CM | POA: Diagnosis not present

## 2017-06-11 DIAGNOSIS — Z853 Personal history of malignant neoplasm of breast: Secondary | ICD-10-CM | POA: Diagnosis not present

## 2017-06-11 DIAGNOSIS — N186 End stage renal disease: Secondary | ICD-10-CM | POA: Diagnosis not present

## 2017-06-11 DIAGNOSIS — Z7984 Long term (current) use of oral hypoglycemic drugs: Secondary | ICD-10-CM | POA: Diagnosis not present

## 2017-06-11 DIAGNOSIS — R197 Diarrhea, unspecified: Secondary | ICD-10-CM | POA: Insufficient documentation

## 2017-06-11 DIAGNOSIS — R42 Dizziness and giddiness: Secondary | ICD-10-CM | POA: Diagnosis present

## 2017-06-11 LAB — COMPREHENSIVE METABOLIC PANEL
ALT: 9 U/L — ABNORMAL LOW (ref 14–54)
AST: 12 U/L — ABNORMAL LOW (ref 15–41)
Albumin: 4 g/dL (ref 3.5–5.0)
Alkaline Phosphatase: 49 U/L (ref 38–126)
Anion gap: 8 (ref 5–15)
BUN: 24 mg/dL — ABNORMAL HIGH (ref 6–20)
CO2: 23 mmol/L (ref 22–32)
Calcium: 8.7 mg/dL — ABNORMAL LOW (ref 8.9–10.3)
Chloride: 105 mmol/L (ref 101–111)
Creatinine, Ser: 1.4 mg/dL — ABNORMAL HIGH (ref 0.44–1.00)
GFR calc Af Amer: 52 mL/min — ABNORMAL LOW (ref >60)
GFR calc non Af Amer: 45 mL/min — ABNORMAL LOW (ref >60)
Glucose, Bld: 145 mg/dL — ABNORMAL HIGH (ref 65–99)
Potassium: 4 mmol/L (ref 3.5–5.1)
Sodium: 136 mmol/L (ref 135–145)
Total Bilirubin: 0.6 mg/dL (ref 0.3–1.2)
Total Protein: 7 g/dL (ref 6.5–8.1)

## 2017-06-11 LAB — CBC WITH DIFFERENTIAL/PLATELET
Basophils Absolute: 0 10*3/uL (ref 0.0–0.1)
Basophils Relative: 0 %
Eosinophils Absolute: 0.1 10*3/uL (ref 0.0–0.7)
Eosinophils Relative: 1 %
HCT: 35.6 % — ABNORMAL LOW (ref 36.0–46.0)
Hemoglobin: 11.2 g/dL — ABNORMAL LOW (ref 12.0–15.0)
Lymphocytes Relative: 4 %
Lymphs Abs: 0.3 10*3/uL — ABNORMAL LOW (ref 0.7–4.0)
MCH: 26.2 pg (ref 26.0–34.0)
MCHC: 31.5 g/dL (ref 30.0–36.0)
MCV: 83.2 fL (ref 78.0–100.0)
Monocytes Absolute: 0.6 10*3/uL (ref 0.1–1.0)
Monocytes Relative: 7 %
Neutro Abs: 7.7 10*3/uL (ref 1.7–7.7)
Neutrophils Relative %: 88 %
Platelets: 240 10*3/uL (ref 150–400)
RBC: 4.28 MIL/uL (ref 3.87–5.11)
RDW: 13.6 % (ref 11.5–15.5)
WBC: 8.7 10*3/uL (ref 4.0–10.5)

## 2017-06-11 MED ORDER — PROMETHAZINE HCL 25 MG PO TABS: 25.0000 mg | ORAL_TABLET | Freq: Four times a day (QID) | ORAL | 0 refills | Status: AC | PRN

## 2017-06-11 MED ORDER — METOCLOPRAMIDE HCL 5 MG/ML IJ SOLN
10.0000 mg | Freq: Once | INTRAMUSCULAR | Status: AC
  Administered 2017-06-11: 10 mg via INTRAVENOUS
  Filled 2017-06-11: qty 2

## 2017-06-11 MED ORDER — LOPERAMIDE HCL 2 MG PO CAPS
4.0000 mg | ORAL_CAPSULE | Freq: Once | ORAL | Status: AC
  Administered 2017-06-11: 4 mg via ORAL
  Filled 2017-06-11: qty 2

## 2017-06-11 MED ORDER — SODIUM CHLORIDE 0.9 % IV BOLUS (SEPSIS)
1000.0000 mL | Freq: Once | INTRAVENOUS | Status: AC
  Administered 2017-06-11: 1000 mL via INTRAVENOUS

## 2017-06-11 MED ORDER — ONDANSETRON HCL 4 MG/2ML IJ SOLN
4.0000 mg | Freq: Once | INTRAMUSCULAR | Status: AC
  Administered 2017-06-11: 4 mg via INTRAVENOUS
  Filled 2017-06-11: qty 2

## 2017-06-11 NOTE — ED Triage Notes (Signed)
Pt c/o abd pain with n/v/d.   

## 2017-06-11 NOTE — ED Provider Notes (Signed)
[facility] DEPT Provider Note   CSN: [id number] Arrival date & time: 06/11/17  0540     History   Chief Complaint Chief Complaint  Patient presents with  . Emesis    HPI [name] is a 41 y.o. female.  The history is provided by the patient.  She has history of renal transplant. She has had nausea for the last 4 days, and started vomiting last night. She's vomited multiple times. She also developed diarrhea last night and has had multiple episodes of diarrhea. No blood in stool or emesis. She denies fever but has had chills and sweats. There is soreness in the abdomen from vomiting but no true abdominal pain. She has had dizziness for the last several days which consists of lightheadedness. She denies vertigo. She has taken ondansetron and promethazine at home without relief. Old records are reviewed, and she has no relevant past visits.  Past Medical History:  Diagnosis Date  . Anxiety    OCD  . Asthma    as a child  . Cancer [facility]) 2001   renal cell , breast cancer both breast 2011, cervical cancer 2013  . Chronic anemia   . Chronic pain   . Complication of anesthesia    hard to sedate dr hung aware  . Crohn's disease ([place])   . Diabetes mellitus without complication ([place])   . Fracture 05/01/2013   Right foot, in cast  . History of blood transfusion 2 years ago  . Hyperlipidemia   . Hypothyroidism    parathroid runs low  . Meningioma ([place])   . Panic disorder   . PONV (postoperative nausea and vomiting)   . Renal disorder    S/p '11-Kidney transplant-Dr. Florene Glen follows  . Urticaria     Patient Active Problem List   Diagnosis Date Noted  . UTI (lower urinary tract infection) 11/20/2013  . CKD (chronic kidney disease) stage 3, GFR 30-59 ml/min 08/25/2013  . Perirectal abscess 08/24/2013  . ESRD (end stage renal disease) ([place]) 03/27/2011  . Kidney transplant status, living unrelated donor 03/27/2011  . Anemia 03/27/2011  . Type 2 diabetes mellitus ([place])  03/27/2011  . Osteoarthritis of right knee 03/27/2011  . Lumbago 03/27/2011  . HTN (hypertension) 03/27/2011  . HLD (hyperlipidemia) 03/27/2011  . Generalized anxiety disorder 03/27/2011  . Depression 03/27/2011  . Chronic steroid use 03/27/2011  . Crohn's disease ([place]) 03/27/2011  . OCD (obsessive compulsive disorder) 03/27/2011  . 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 Obstructive sleep apnea, adult 03/27/2011    Past Surgical History:  Procedure Laterality Date  . ABDOMINAL HYSTERECTOMY  02/13/2012   Procedure: HYSTERECTOMY ABDOMINAL;  Surgeon: Cyril Mourning, MD;  Location: [facility];  Service: Gynecology;  Laterality: N/A;  . BRAVO [place] STUDY N/A 11/27/2013   Procedure: BRAVO [place];  Surgeon: Beryle Beams, MD;  Location: [facility];  Service: Endoscopy;  Laterality: N/A;  . BRAVO [place] STUDY N/A 12/11/2013   Procedure: BRAVO [place];  Surgeon: Beryle Beams, MD;  Location: [facility];  Service: Endoscopy;  Laterality: N/A;  . breast lumpectomy and reconstruction Bilateral 2011  . BREAST SURGERY     bilateral lumpectomy 4'11  . COLONOSCOPY N/A 09/25/2013   Procedure: COLONOSCOPY;  Surgeon: Beryle Beams, MD;  Location: [facility];  Service: Endoscopy;  Laterality: N/A;  . COLONOSCOPY WITH PROPOFOL N/A 07/29/2015   Procedure: COLONOSCOPY WITH PROPOFOL;  Surgeon: Carol Ada, MD;  Location: [facility];  Service: Endoscopy;  Laterality: N/A;  .  CYSTOSCOPY  02/13/2012   Procedure: CYSTOSCOPY;  Surgeon: Franchot Gallo, MD;  Location: [facility];  Service: Urology;  Laterality: N/A;  insertion of ureteral catheter , removed per Dr Diona Fanti   . ESOPHAGEAL MANOMETRY N/A 08/10/2013   Procedure: ESOPHAGEAL MANOMETRY (EM);  Surgeon: Beryle Beams, MD;  Location: [facility];  Service: Endoscopy;  Laterality: N/A;  . ESOPHAGOGASTRODUODENOSCOPY N/A 09/25/2013   Procedure: ESOPHAGOGASTRODUODENOSCOPY (EGD);  Surgeon: Beryle Beams, MD;  Location: [facility];  Service: Endoscopy;   Laterality: N/A;  . ESOPHAGOGASTRODUODENOSCOPY N/A 11/27/2013   Procedure: ESOPHAGOGASTRODUODENOSCOPY (EGD);  Surgeon: Beryle Beams, MD;  Location: [facility];  Service: Endoscopy;  Laterality: N/A;  . ESOPHAGOGASTRODUODENOSCOPY N/A 12/11/2013   Procedure: ESOPHAGOGASTRODUODENOSCOPY (EGD);  Surgeon: Beryle Beams, MD;  Location: [facility];  Service: Endoscopy;  Laterality: N/A;  . GROIN DISSECTION Left 10/09/2013   Procedure: INCISION AND DRAINAGE OF LEFT GROIN ABSCESS;  Surgeon: Shann Medal, MD;  Location: [facility];  Service: General;  Laterality: Left;  . INCISION AND DRAINAGE ABSCESS N/A 08/26/2013   Procedure: INCISION AND DRAINAGE PERIRECTAL ABSCESS;  Surgeon: Jamesetta So, MD;  Location: [facility];  Service: General;  Laterality: N/A;  . KNEE SURGERY Left april 2003   arthroscopy  . LOWER EXTREMITY ARTERIAL DOPLLERS  05/06/2012   Bilat ABIs were attempted-not accessible due to calcified veins. Bilat TBIs demonstrated no obtainable flow through both great toes. Bilat PVR ankle regions demonstrated moderately abnormal pulsatile flow. Lft SFA demonstrated a 50-69% diameter reduction. Lft runoff demonstrated occlusive diseasewith reconstitution of flow noted distally.  [place] and 2011   last '11-[facility]  . PARATHYROIDECTOMY  02-20-2001  . SLEEVE GASTROPLASTY     for GERD, hiatal hernia  . TUBAL LIGATION  02-19-2000    OB History    Gravida Para Term Preterm AB Living             0   SAB TAB Ectopic Multiple Live Births                   Home Medications    Prior to Admission medications   Medication Sig Start Date End Date Taking? Authorizing Provider  albuterol (PROAIR HFA) 108 (90 Base) MCG/ACT inhaler Inhale 2 puffs into the lungs every 4 (four) hours as needed for wheezing or shortness of breath. Patient not taking: Reported on 04/25/2017 04/11/17   Valentina Shaggy, MD  ALPRAZolam Duanne Moron) 0.5 MG tablet Take 0.25-0.5 mg by mouth 3 (three)  times daily as needed for anxiety or sleep.     [provider]  diphenhydrAMINE (BENADRYL) 25 MG tablet Take 1 tablet (25 mg total) by mouth every 6 (six) hours. Patient not taking: Reported on 04/25/2017 02/07/17   Macarthur Critchley, MD  EPINEPHrine 0.3 mg/0.3 mL IJ SOAJ injection Use as directed, if administer call 911 02/08/17   [provider]  fluocinonide cream (LIDEX) 6.26 % Apply 1 application topically daily as needed (for irritation).     [provider]  FLUoxetine (PROZAC) 20 MG capsule Take 60 mg by mouth daily.    [provider]  hydrOXYzine (ATARAX/VISTARIL) 50 MG tablet Take 50 mg by mouth at bedtime.     [provider]  linaclotide (LINZESS) 145 MCG CAPS capsule Take 145 mcg by mouth daily as needed (for constipation).    [provider]  loratadine (CLARITIN) 10 MG tablet Take 10 mg by mouth daily.    [provider]  metFORMIN (GLUCOPHAGE-XR) 500 MG 24 hr tablet Take 500 mg by mouth 2 (two) times daily.    [provider]  mycophenolate (MYFORTIC) 180 MG EC tablet Take 540 mg by mouth 2 (two) times daily.     Niel Hummer, NP  omeprazole (PRILOSEC) 40 MG capsule Take 40 mg by mouth 2 (two) times daily.     [provider]  oxyCODONE (OXY IR/ROXICODONE) 5 MG immediate release tablet Take 5 mg by mouth at bedtime as needed for severe pain.    [provider]  predniSONE (DELTASONE) 10 MG tablet Take 10 mg by mouth daily with breakfast.     [provider]  promethazine (PHENERGAN) 50 MG tablet Take 50 mg by mouth every 6 (six) hours as needed for nausea or vomiting.    [provider]  rosuvastatin (CRESTOR) 5 MG tablet Take 5 mg by mouth at bedtime.    [provider]  tacrolimus (PROGRAF) 1 MG capsule Take 3 mg by mouth 2 (two) times daily.    [provider]  Vitamin D, Ergocalciferol, (DRISDOL) 50000 units CAPS capsule Take 50,000 Units by mouth  every Monday.     [provider]    Family History Family History  Problem Relation Age of Onset  . Diabetes Mother   . Hypertension Mother   . Asthma Mother   . Diabetes Father   . Hypertension Father   . Cancer Father        Prostate cancer  . Asthma Father   . Obesity Sister   . Eczema Sister   . Stroke Maternal Grandmother   . Heart attack Maternal Grandfather   . Polycystic ovary syndrome Sister     Social History Social History  Substance Use Topics  . Smoking status: Never Smoker  . Smokeless tobacco: Never Used  . Alcohol use No     Allergies   Barium-containing compounds; Dilaudid [hydromorphone hcl]; Ivp dye [iodinated diagnostic agents]; Shellfish allergy; Vancomycin; Adhesive [tape]; Infed [iron dextran]; Sulfa antibiotics; and Iopamidol   Review of Systems Review of Systems  All other systems reviewed and are negative.    Physical Exam Updated Vital Signs BP 103/75 (BP Location: Left Arm)   Pulse 89   Temp 99.1 F (37.3 C) (Oral)   Resp 17   Ht 5\' 3"  (1.6 m)   Wt 83.5 kg (184 lb)   LMP 01/30/2012   SpO2 97%   BMI 32.59 kg/m   Physical Exam  Nursing note and vitals reviewed.  41 year old female, resting comfortably and in no acute distress. Vital signs are normal. Oxygen saturation is 97%, which is normal. Head is normocephalic and atraumatic. PERRLA, EOMI. Oropharynx is clear. Neck is nontender and supple without adenopathy or JVD. Back is nontender and there is no CVA tenderness. Lungs are clear without rales, wheezes, or rhonchi. Chest is nontender. Heart has regular rate and rhythm without murmur. Abdomen is soft, flat, nontender without hepatosplenomegaly and peristalsis is normoactive. Renal transplant is palpable in the right pelvic area with mild tenderness. Extremities have no cyanosis or edema, full range of motion is present. Skin is warm and dry without rash. Neurologic: Mental status is normal, cranial nerves are  intact, there are no motor or sensory deficits.  ED Treatments / Results  Labs (all labs ordered are listed, but only abnormal results are displayed) Labs Reviewed  CBC WITH DIFFERENTIAL/PLATELET - Abnormal; Notable for the following:       Result Value  Hemoglobin 11.2 (*)    HCT 35.6 (*)    Lymphs Abs 0.3 (*)    All other components within normal limits  URINALYSIS, ROUTINE W REFLEX MICROSCOPIC  COMPREHENSIVE METABOLIC PANEL    Procedures Procedures (including critical care time)  Medications Ordered in ED Medications  sodium chloride 0.9 % bolus 1,000 mL (1,000 mLs Intravenous New Bag/Given 06/11/17 0625)  ondansetron (ZOFRAN) injection 4 mg (4 mg Intravenous Given 06/11/17 0625)  loperamide (IMODIUM) capsule 4 mg (4 mg Oral Given 06/11/17 0625)  metoCLOPramide (REGLAN) injection 10 mg (10 mg Intravenous Given 06/11/17 0740)     Initial Impression / Assessment and Plan / ED Course  I have reviewed the triage vital signs and the nursing notes.  Pertinent labs & imaging results that were available during my care of the patient were reviewed by me and considered in my medical decision making (see chart for details).  Nausea, vomiting, diarrhea strongly suggestive of viral gastroenteritis. Because of history of renal transplant, we'll need to check renal function. She is given IV fluids and IV ondansetron. Also, oral loperamide. Old records are reviewed, and I see no relevant visits recently.   She did not get adequate relief of nausea with ondansetron, so she is given metoclopramide. Hemoglobin is stable compared with baseline,, WBC is not elevated. Renal function and urinalysis are pending. Case is signed out to Dr. Alvino Chapel.  Final Clinical Impressions(s) / ED Diagnoses   Final diagnoses:  Nausea vomiting and diarrhea  History of renal transplant    New Prescriptions New Prescriptions   No medications on file     Delora Fuel, MD 74/73/40 [phone]

## 2017-06-11 NOTE — ED Notes (Signed)
Md made aware of 100.0 temp.

## 2017-06-11 NOTE — ED Notes (Signed)
Unable to get blood work back from IV site.

## 2017-06-11 NOTE — ED Notes (Signed)
Pt refusing to give urine specimen. States " I only pee at my bathroom at home."

## 2017-06-11 NOTE — ED Notes (Signed)
Pt continues to have nausea

## 2017-06-11 NOTE — ED Provider Notes (Signed)
  Physical Exam  BP 112/74   Pulse 74   Temp 99.1 F (37.3 C) (Oral)   Resp 16   Ht 5\' 3"  (1.6 m)   Wt 83.5 kg (184 lb)   LMP 01/30/2012   SpO2 97%   BMI 32.59 kg/m   Physical Exam  ED Course  Procedures  MDM Received patient in signout. Nausea vomiting and some diarrhea. Labs reassuring. However patient is not willing to provide urine. Previous kidney transplant. States that she will alternate the privacy of her own home. Urinary tract infection considered but unable to be ruled out. Will discharge home. Patient instructed that she can return any time for further treatment. Watch for fevers or urinary symptoms.       Davonna Belling, MD 06/11/17 [phone]

## 2017-06-11 NOTE — Discharge Instructions (Signed)
Watch for fever or urinary symptoms. Return if if these develop or you allow your urine to be checked.

## 2017-06-24 ENCOUNTER — Other Ambulatory Visit (HOSPITAL_COMMUNITY): Payer: Self-pay | Admitting: Specialist

## 2017-06-24 DIAGNOSIS — R1310 Dysphagia, unspecified: Secondary | ICD-10-CM

## 2017-07-21 ENCOUNTER — Encounter (HOSPITAL_COMMUNITY): Payer: Self-pay | Admitting: Emergency Medicine

## 2017-07-21 ENCOUNTER — Emergency Department (HOSPITAL_COMMUNITY)
Admission: EM | Admit: 2017-07-21 | Discharge: 2017-07-21 | Disposition: A | Discharge status: Other Acute Inpatient Hospital | Payer: Managed Care, Other (non HMO) | Attending: Emergency Medicine | Admitting: Emergency Medicine

## 2017-07-21 ENCOUNTER — Emergency Department (HOSPITAL_COMMUNITY): Payer: Managed Care, Other (non HMO)

## 2017-07-21 DIAGNOSIS — E039 Hypothyroidism, unspecified: Secondary | ICD-10-CM | POA: Diagnosis not present

## 2017-07-21 DIAGNOSIS — I129 Hypertensive chronic kidney disease with stage 1 through stage 4 chronic kidney disease, or unspecified chronic kidney disease: Secondary | ICD-10-CM | POA: Insufficient documentation

## 2017-07-21 DIAGNOSIS — Z7901 Long term (current) use of anticoagulants: Secondary | ICD-10-CM | POA: Diagnosis not present

## 2017-07-21 DIAGNOSIS — Z79899 Other long term (current) drug therapy: Secondary | ICD-10-CM | POA: Insufficient documentation

## 2017-07-21 DIAGNOSIS — E1122 Type 2 diabetes mellitus with diabetic chronic kidney disease: Secondary | ICD-10-CM | POA: Insufficient documentation

## 2017-07-21 DIAGNOSIS — N183 Chronic kidney disease, stage 3 (moderate): Secondary | ICD-10-CM | POA: Insufficient documentation

## 2017-07-21 DIAGNOSIS — Z8541 Personal history of malignant neoplasm of cervix uteri: Secondary | ICD-10-CM | POA: Diagnosis not present

## 2017-07-21 DIAGNOSIS — Z853 Personal history of malignant neoplasm of breast: Secondary | ICD-10-CM | POA: Diagnosis not present

## 2017-07-21 DIAGNOSIS — A419 Sepsis, unspecified organism: Secondary | ICD-10-CM | POA: Insufficient documentation

## 2017-07-21 DIAGNOSIS — R509 Fever, unspecified: Secondary | ICD-10-CM | POA: Diagnosis present

## 2017-07-21 LAB — COMPREHENSIVE METABOLIC PANEL
ALT: 20 U/L (ref 14–54)
AST: 14 U/L — ABNORMAL LOW (ref 15–41)
Albumin: 3.7 g/dL (ref 3.5–5.0)
Alkaline Phosphatase: 59 U/L (ref 38–126)
Anion gap: 12 (ref 5–15)
BUN: 11 mg/dL (ref 6–20)
CO2: 21 mmol/L — ABNORMAL LOW (ref 22–32)
Calcium: 8.5 mg/dL — ABNORMAL LOW (ref 8.9–10.3)
Chloride: 97 mmol/L — ABNORMAL LOW (ref 101–111)
Creatinine, Ser: 1.39 mg/dL — ABNORMAL HIGH (ref 0.44–1.00)
GFR calc Af Amer: 53 mL/min — ABNORMAL LOW (ref >60)
GFR calc non Af Amer: 46 mL/min — ABNORMAL LOW (ref >60)
Glucose, Bld: 120 mg/dL — ABNORMAL HIGH (ref 65–99)
Potassium: 4.1 mmol/L (ref 3.5–5.1)
Sodium: 130 mmol/L — ABNORMAL LOW (ref 135–145)
Total Bilirubin: 1 mg/dL (ref 0.3–1.2)
Total Protein: 6.8 g/dL (ref 6.5–8.1)

## 2017-07-21 LAB — CBC WITH DIFFERENTIAL/PLATELET
Basophils Absolute: 0 10*3/uL (ref 0.0–0.1)
Basophils Relative: 0 %
Eosinophils Absolute: 0.1 10*3/uL (ref 0.0–0.7)
Eosinophils Relative: 1 %
HCT: 32.6 % — ABNORMAL LOW (ref 36.0–46.0)
Hemoglobin: 10.5 g/dL — ABNORMAL LOW (ref 12.0–15.0)
Lymphocytes Relative: 8 %
Lymphs Abs: 1 10*3/uL (ref 0.7–4.0)
MCH: 26.6 pg (ref 26.0–34.0)
MCHC: 32.2 g/dL (ref 30.0–36.0)
MCV: 82.5 fL (ref 78.0–100.0)
Monocytes Absolute: 1 10*3/uL (ref 0.1–1.0)
Monocytes Relative: 8 %
Neutro Abs: 10.2 10*3/uL — ABNORMAL HIGH (ref 1.7–7.7)
Neutrophils Relative %: 83 %
Platelets: 271 10*3/uL (ref 150–400)
RBC: 3.95 MIL/uL (ref 3.87–5.11)
RDW: 13.2 % (ref 11.5–15.5)
WBC: 12.3 10*3/uL — ABNORMAL HIGH (ref 4.0–10.5)

## 2017-07-21 LAB — URINALYSIS, ROUTINE W REFLEX MICROSCOPIC
Bilirubin Urine: NEGATIVE
Glucose, UA: NEGATIVE mg/dL
Hgb urine dipstick: NEGATIVE
Ketones, ur: NEGATIVE mg/dL
Leukocytes, UA: NEGATIVE
Nitrite: NEGATIVE
Protein, ur: NEGATIVE mg/dL
Specific Gravity, Urine: 1.011 (ref 1.005–1.030)
pH: 8 (ref 5.0–8.0)

## 2017-07-21 LAB — I-STAT CG4 LACTIC ACID, ED
Lactic Acid, Venous: 1.99 mmol/L — ABNORMAL HIGH (ref 0.5–1.9)
Lactic Acid, Venous: 0.45 mmol/L — ABNORMAL LOW (ref 0.5–1.9)

## 2017-07-21 LAB — CBG MONITORING, ED: Glucose-Capillary: 114 mg/dL — ABNORMAL HIGH (ref 65–99)

## 2017-07-21 MED ORDER — DIPHENHYDRAMINE HCL 50 MG/ML IJ SOLN
25.0000 mg | Freq: Once | INTRAMUSCULAR | Status: AC
  Administered 2017-07-21: 25 mg via INTRAVENOUS
  Filled 2017-07-21: qty 1

## 2017-07-21 MED ORDER — SODIUM CHLORIDE 0.9 % IV SOLN
INTRAVENOUS | Status: DC
  Administered 2017-07-21: 18:00:00 via INTRAVENOUS

## 2017-07-21 MED ORDER — HYDROCORTISONE NA SUCCINATE PF 100 MG IJ SOLR
100.0000 mg | Freq: Once | INTRAMUSCULAR | Status: AC
  Administered 2017-07-21: 100 mg via INTRAVENOUS
  Filled 2017-07-21: qty 2

## 2017-07-21 MED ORDER — ACETAMINOPHEN 500 MG PO TABS
1000.0000 mg | ORAL_TABLET | Freq: Once | ORAL | Status: AC
  Administered 2017-07-21: 1000 mg via ORAL
  Filled 2017-07-21: qty 2

## 2017-07-21 MED ORDER — VANCOMYCIN HCL IN DEXTROSE 1-5 GM/200ML-% IV SOLN: 1000.0000 mg | Freq: Once | INTRAVENOUS | Status: DC

## 2017-07-21 MED ORDER — LORAZEPAM 2 MG/ML IJ SOLN
1.0000 mg | Freq: Once | INTRAMUSCULAR | Status: AC
  Administered 2017-07-21: 1 mg via INTRAVENOUS
  Filled 2017-07-21: qty 1

## 2017-07-21 MED ORDER — PIPERACILLIN-TAZOBACTAM 3.375 G IVPB 30 MIN
3.3750 g | Freq: Once | INTRAVENOUS | Status: AC
  Administered 2017-07-21: 3.375 g via INTRAVENOUS
  Filled 2017-07-21: qty 50

## 2017-07-21 MED ORDER — VANCOMYCIN HCL 10 G IV SOLR
1500.0000 mg | Freq: Once | INTRAVENOUS | Status: AC
  Administered 2017-07-21: 1500 mg via INTRAVENOUS
  Filled 2017-07-21: qty 1500

## 2017-07-21 MED ORDER — MORPHINE SULFATE (PF) 2 MG/ML IV SOLN
2.0000 mg | Freq: Once | INTRAVENOUS | Status: AC
Start: 2017-07-21 — End: 2017-07-21
  Administered 2017-07-21: 2 mg via INTRAVENOUS
  Filled 2017-07-21: qty 1

## 2017-07-21 NOTE — ED Notes (Signed)
Pt refusing rectal tylenol at this time stating "you can do it in my IV not in my butt".

## 2017-07-21 NOTE — Progress Notes (Signed)
Pharmacy Note:  Initial antibiotics for Vancomycin and zosyn ordered by EDP for sepsis  Estimated Creatinine Clearance: 51.1 mL/min (A) (by C-G formula based on SCr of 1.39 mg/dL (H)).   Allergies  Allergen Reactions  . Barium-Containing Compounds Anaphylaxis  . Dilaudid [Hydromorphone Hcl] Anaphylaxis  . Ivp Dye [Iodinated Diagnostic Agents] Anaphylaxis  . Shellfish Allergy Shortness Of Breath, Itching and Rash  . Vancomycin Other (See Comments)    Redman syndrome.   . Adhesive [Tape] Dermatitis  . Infed [Iron Dextran] Itching  . Sulfa Antibiotics Hives  . Iopamidol Itching and Rash    Vitals:   07/21/17 1443 07/21/17 1530  BP: (!) 87/64 115/66  Pulse: (!) 106 (!) 101  Resp: (!) 28 18  Temp: (!) 102.4 F (39.1 C)     Anti-infectives    Start     Dose/Rate Route Frequency Ordered Stop   07/21/17 1530  vancomycin (VANCOCIN) 1,500 mg in sodium chloride 0.9 % 500 mL IVPB     1,500 mg 125 mL/hr over 240 Minutes Intravenous  Once 07/21/17 1523     07/21/17 1515  piperacillin-tazobactam (ZOSYN) IVPB 3.375 g     3.375 g 100 mL/hr over 30 Minutes Intravenous  Once 07/21/17 1511     07/21/17 1515  vancomycin (VANCOCIN) IVPB 1000 mg/200 mL premix  Status:  Discontinued     1,000 mg 200 mL/hr over 60 Minutes Intravenous  Once 07/21/17 1511 07/21/17 1523      Plan: Initial doses of Vancomycin 1500mg  IV and Zosyn 3.375gm IV X 1 ordered. F/U admission orders for further dosing if therapy continued.  Isac Sarna, BS Pharm D, [place] Clinical Pharmacist Pager [phone]  07/21/2017 4:01 PM

## 2017-07-21 NOTE — ED Notes (Signed)
CD made of CT scan. CD given to primary RN. Pt and EDP aware that [facility] will be en route "shortly".

## 2017-07-21 NOTE — ED Provider Notes (Addendum)
[facility] DEPT Provider Note   CSN: [id number] Arrival date & time: 07/21/17  1436     History   Chief Complaint Chief Complaint  Patient presents with  . Fever    HPI [name] is a 41 y.o. female.level 5 caveat acutity situation patient developed a fever and shaking chills 4:30 AM today she complains of diffuse body aches. No vomiting. No cough. No other associated symptoms. Patient underwent paraesophageal laparoscopic hernia repair on 07/16/2017 at [facility]. No treatment prior to coming here.  HPI  Past Medical History:  Diagnosis Date  . Anxiety    OCD  . Asthma    as a child  . Cancer [facility]) 2001   renal cell , breast cancer both breast 2011, cervical cancer 2013  . Chronic anemia   . Chronic pain   . Complication of anesthesia    hard to sedate dr hung aware  . Crohn's disease ([place])   . Diabetes mellitus without complication ([place])   . Fracture 05/01/2013   Right foot, in cast  . History of blood transfusion 2 years ago  . Hyperlipidemia   . Hypothyroidism    parathroid runs low  . Meningioma ([place])   . Panic disorder   . PONV (postoperative nausea and vomiting)   . Renal disorder    S/p '11-Kidney transplant-Dr. Florene Glen follows  . Urticaria     Patient Active Problem List   Diagnosis Date Noted  . UTI (lower urinary tract infection) 11/20/2013  . CKD (chronic kidney disease) stage 3, GFR 30-59 ml/min 08/25/2013  . Perirectal abscess 08/24/2013  . ESRD (end stage renal disease) ([place]) 03/27/2011  . Kidney transplant status, living unrelated donor 03/27/2011  . Anemia 03/27/2011  . Type 2 diabetes mellitus ([place]) 03/27/2011  . Osteoarthritis of right knee 03/27/2011  . Lumbago 03/27/2011  . HTN (hypertension) 03/27/2011  . HLD (hyperlipidemia) 03/27/2011  . Generalized anxiety disorder 03/27/2011  . Depression 03/27/2011  . Chronic steroid use 03/27/2011  . Crohn's disease ([place]) 03/27/2011  . OCD (obsessive compulsive disorder)  03/27/2011  . 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 Obstructive sleep apnea, adult 03/27/2011    Past Surgical History:  Procedure Laterality Date  . ABDOMINAL HYSTERECTOMY  02/13/2012   Procedure: HYSTERECTOMY ABDOMINAL;  Surgeon: Cyril Mourning, MD;  Location: [facility];  Service: Gynecology;  Laterality: N/A;  . BRAVO [place] STUDY N/A 11/27/2013   Procedure: BRAVO [place];  Surgeon: Beryle Beams, MD;  Location: [facility];  Service: Endoscopy;  Laterality: N/A;  . BRAVO [place] STUDY N/A 12/11/2013   Procedure: BRAVO [place];  Surgeon: Beryle Beams, MD;  Location: [facility];  Service: Endoscopy;  Laterality: N/A;  . breast lumpectomy and reconstruction Bilateral 2011  . BREAST SURGERY     bilateral lumpectomy 4'11  . COLONOSCOPY N/A 09/25/2013   Procedure: COLONOSCOPY;  Surgeon: Beryle Beams, MD;  Location: [facility];  Service: Endoscopy;  Laterality: N/A;  . COLONOSCOPY WITH PROPOFOL N/A 07/29/2015   Procedure: COLONOSCOPY WITH PROPOFOL;  Surgeon: Carol Ada, MD;  Location: [facility];  Service: Endoscopy;  Laterality: N/A;  . CYSTOSCOPY  02/13/2012   Procedure: CYSTOSCOPY;  Surgeon: Franchot Gallo, MD;  Location: [facility];  Service: Urology;  Laterality: N/A;  insertion of ureteral catheter , removed per Dr Diona Fanti   . ESOPHAGEAL MANOMETRY N/A 08/10/2013   Procedure: ESOPHAGEAL MANOMETRY (EM);  Surgeon: Beryle Beams, MD;  [facility]: [facility];  Service: Endoscopy;  Laterality: N/A;  .  ESOPHAGOGASTRODUODENOSCOPY N/A 09/25/2013   Procedure: ESOPHAGOGASTRODUODENOSCOPY (EGD);  Surgeon: Beryle Beams, MD;  Location: [facility];  Service: Endoscopy;  Laterality: N/A;  . ESOPHAGOGASTRODUODENOSCOPY N/A 11/27/2013   Procedure: ESOPHAGOGASTRODUODENOSCOPY (EGD);  Surgeon: Beryle Beams, MD;  Location: [facility];  Service: Endoscopy;  Laterality: N/A;  . ESOPHAGOGASTRODUODENOSCOPY N/A 12/11/2013   Procedure: ESOPHAGOGASTRODUODENOSCOPY (EGD);  Surgeon: Beryle Beams, MD;   Location: [facility];  Service: Endoscopy;  Laterality: N/A;  . GROIN DISSECTION Left 10/09/2013   Procedure: INCISION AND DRAINAGE OF LEFT GROIN ABSCESS;  Surgeon: Shann Medal, MD;  Location: [facility];  Service: General;  Laterality: Left;  . INCISION AND DRAINAGE ABSCESS N/A 08/26/2013   Procedure: INCISION AND DRAINAGE PERIRECTAL ABSCESS;  Surgeon: Jamesetta So, MD;  Location: [facility];  Service: General;  Laterality: N/A;  . KNEE SURGERY Left april 2003   arthroscopy  . LOWER EXTREMITY ARTERIAL DOPLLERS  05/06/2012   Bilat ABIs were attempted-not accessible due to calcified veins. Bilat TBIs demonstrated no obtainable flow through both great toes. Bilat PVR ankle regions demonstrated moderately abnormal pulsatile flow. Lft SFA demonstrated a 50-69% diameter reduction. Lft runoff demonstrated occlusive diseasewith reconstitution of flow noted distally.  [place] and 2011   last '11-[facility]  . PARATHYROIDECTOMY  02-20-2001  . SLEEVE GASTROPLASTY     for GERD, hiatal hernia  . TUBAL LIGATION  02-19-2000    OB History    Gravida Para Term Preterm AB Living             0   SAB TAB Ectopic Multiple Live Births                   Home Medications    Prior to Admission medications   Medication Sig Start Date End Date Taking? Authorizing Provider  albuterol (PROAIR HFA) 108 (90 Base) MCG/ACT inhaler Inhale 2 puffs into the lungs every 4 (four) hours as needed for wheezing or shortness of breath. Patient not taking: Reported on 04/25/2017 04/11/17   Valentina Shaggy, MD  ALPRAZolam Duanne Moron) 0.5 MG tablet Take 0.25-0.5 mg by mouth 3 (three) times daily as needed for anxiety or sleep.     [provider]  diphenhydrAMINE (BENADRYL) 25 MG tablet Take 1 tablet (25 mg total) by mouth every 6 (six) hours. Patient not taking: Reported on 04/25/2017 02/07/17   Macarthur Critchley, MD  EPINEPHrine 0.3 mg/0.3 mL IJ SOAJ injection Use as directed, if administer  call 911 02/08/17   [provider]  fluocinonide cream (LIDEX) 2.70 % Apply 1 application topically daily as needed (for irritation).     [provider]  FLUoxetine (PROZAC) 20 MG capsule Take 60 mg by mouth daily.    [provider]  hydrOXYzine (ATARAX/VISTARIL) 50 MG tablet Take 50 mg by mouth at bedtime.     [provider]  linaclotide (LINZESS) 145 MCG CAPS capsule Take 145 mcg by mouth daily as needed (for constipation).    [provider]  loratadine (CLARITIN) 10 MG tablet Take 10 mg by mouth daily.    [provider]  metFORMIN (GLUCOPHAGE-XR) 500 MG 24 hr tablet Take 500 mg by mouth 2 (two) times daily.    [provider]  mycophenolate (MYFORTIC) 180 MG EC tablet Take 540 mg by mouth 2 (two) times daily.     Niel Hummer, NP  omeprazole (PRILOSEC) 40 MG capsule Take 40 mg by mouth 2 (two) times daily.  [provider]  oxyCODONE (OXY IR/ROXICODONE) 5 MG immediate release tablet Take 5 mg by mouth at bedtime as needed for severe pain.    [provider]  predniSONE (DELTASONE) 10 MG tablet Take 10 mg by mouth daily with breakfast.     [provider]  promethazine (PHENERGAN) 25 MG tablet Take 1 tablet (25 mg total) by mouth every 6 (six) hours as needed for nausea. 06/11/17   Davonna Belling, MD  rosuvastatin (CRESTOR) 5 MG tablet Take 5 mg by mouth at bedtime.    [provider]  tacrolimus (PROGRAF) 1 MG capsule Take 3 mg by mouth 2 (two) times daily.    [provider]  Vitamin D, Ergocalciferol, (DRISDOL) 50000 units CAPS capsule Take 50,000 Units by mouth every Monday.     [provider]    Family History Family History  Problem Relation Age of Onset  . Diabetes Mother   . Hypertension Mother   . Asthma Mother   . Diabetes Father   . Hypertension Father   . Cancer Father        Prostate cancer  . Asthma Father   . Obesity Sister   . Eczema Sister     . Stroke Maternal Grandmother   . Heart attack Maternal Grandfather   . Polycystic ovary syndrome Sister     Social History Social History  Substance Use Topics  . Smoking status: Never Smoker  . Smokeless tobacco: Never Used  . Alcohol use No     Allergies   Barium-containing compounds; Dilaudid [hydromorphone hcl]; Ivp dye [iodinated diagnostic agents]; Shellfish allergy; Vancomycin; Adhesive [tape]; Infed [iron dextran]; Sulfa antibiotics; and Iopamidol   Review of Systems Review of Systems  Constitutional: Positive for chills and fever.  Musculoskeletal: Positive for myalgias.  Allergic/Immunologic: Positive for immunocompromised state.     Physical Exam Updated Vital Signs BP (!) 87/64   Pulse (!) 106   Temp (!) 102.4 F (39.1 C) (Oral)   Resp (!) 28   Ht 5\' 2"  (1.575 m)   Wt 79.8 kg (176 lb)   LMP 01/30/2012   SpO2 97%   BMI 32.19 kg/m   Physical Exam  Constitutional: She appears well-developed and well-nourished. She appears distressed.  Anxious appearing  HENT:  Head: Normocephalic and atraumatic.  Eyes: Pupils are equal, round, and reactive to light. Conjunctivae are normal.  Neck: Neck supple. No tracheal deviation present. No thyromegaly present.  Cardiovascular: Regular rhythm.   No murmur heard. Mildly tachycardic  Pulmonary/Chest: Breath sounds normal.  Tachypnea  Abdominal: Soft. Bowel sounds are normal. She exhibits no distension. There is no tenderness.  Mild diffuse tenderness  Musculoskeletal: Normal range of motion. She exhibits no edema or tenderness.  Neurological: She is alert. Coordination normal.  Skin: Skin is warm and dry. No rash noted.  Psychiatric:  Anxious  Nursing note and vitals reviewed.    ED Treatments / Results  Labs (all labs ordered are listed, but only abnormal results are displayed) Labs Reviewed  CBC WITH DIFFERENTIAL/PLATELET - Abnormal; Notable for the following:       Result Value   WBC 12.3 (*)     Hemoglobin 10.5 (*)    HCT 32.6 (*)    Neutro Abs 10.2 (*)    All other components within normal limits  CULTURE, BLOOD (ROUTINE X 2)  CULTURE, BLOOD (ROUTINE X 2)  COMPREHENSIVE METABOLIC PANEL  URINALYSIS, ROUTINE W REFLEX MICROSCOPIC  I-STAT CG4 LACTIC ACID, ED  I-STAT CG4  LACTIC ACID, ED    EKG  EKG Interpretation None       Radiology No results found.  Procedures Procedures (including critical care time)  Medications Ordered in ED Medications  piperacillin-tazobactam (ZOSYN) IVPB 3.375 g (not administered)  diphenhydrAMINE (BENADRYL) injection 25 mg (not administered)  hydrocortisone sodium succinate (SOLU-CORTEF) 100 MG injection 100 mg (not administered)  LORazepam (ATIVAN) injection 1 mg (not administered)  vancomycin (VANCOCIN) 1,500 mg in sodium chloride 0.9 % 500 mL IVPB (not administered)   Code sepsis called based on Sirs criteria fever, tachypnea, tachycardia 3:40 PM patient appears more calm after treatment with Ativan 1 mg IV. ED ECG REPORT   Date: 07/21/2017  Rate: 100  Rhythm: sinus tachycardia  QRS Axis: left  Intervals: normal  ST/T Wave abnormalities: normal  Conduction Disutrbances:none  Narrative Interpretation:   Old EKG Reviewed: Rate increased over 02/07/2017 otherwise unchanged  I have personally reviewed the EKG tracing and agree with the computerized printout as noted.  Chest x-ray viewed by me  5:10 PM patient is resting comfortably after treatment with IV Ativan, oral Tylenol. IV antibiotics, IV Benadryl and IV fluids. She appears much more calm. I consulted Dr[facility]McNatt from [facility] who accepts patient in transfer to the emergency department. Initial Impression / Assessment and Plan / ED Course  Of note patient reports that her blood pressure normally runs in the 80s I have reviewed the triage vital signs and the nursing notes.  Pertinent labs & imaging results that were available during my care  of the patient were reviewed by me and considered in my medical decision making (see chart for details).   IV  hydrocortisone ordered as a stress dose of steroids as patient is immunocompromise. Broad spectrum antibiotics ordered urine hospital pharmacist consulted. 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 rate 14, height 5\' 2"  (1.575 m), weight 79.8 kg (176 lb), last menstrual period 01/30/2012, SpO2 97 %.  Heart:     Regular rate and rhythm  Lungs:    CTA  Capillary Refill:   <2 sec  Peripheral Pulse:   Radial pulse palpable  Skin:     Normal Color   Results for orders placed or performed during the hospital encounter of 07/21/17  Comprehensive metabolic panel  Result Value Ref Range   Sodium 130 (L) 135 - 145 mmol/L   Potassium 4.1 3.5 - 5.1 mmol/L   Chloride 97 (L) 101 - 111 mmol/L   CO2 21 (L) 22 - 32 mmol/L   Glucose, Bld 120 (H) 65 - 99 mg/dL   BUN 11 6 - 20 mg/dL   Creatinine, Ser 1.39 (H) 0.44 - 1.00 mg/dL   Calcium 8.5 (L) 8.9 - 10.3 mg/dL   Total Protein 6.8 6.5 - 8.1 g/dL   Albumin 3.7 3.5 - 5.0 g/dL   AST 14 (L) 15 - 41 U/L   ALT 20 14 - 54 U/L   Alkaline Phosphatase 59 38 - 126 U/L   Total Bilirubin 1.0 0.3 - 1.2 mg/dL   GFR calc non Af Amer 46 (L) >60 mL/min   GFR  calc Af Amer 53 (L) >60 mL/min   Anion gap 12 5 - 15  CBC with Differential  Result Value Ref Range   WBC 12.3 (H) 4.0 - 10.5 K/uL   RBC 3.95 3.87 - 5.11 MIL/uL   Hemoglobin 10.5 (L) 12.0 - 15.0 g/dL   HCT 32.6 (L) 36.0 - 46.0 %   MCV 82.5 78.0 - 100.0 fL   MCH 26.6 26.0 - 34.0 pg   MCHC 32.2 30.0 - 36.0 g/dL   RDW 13.2 11.5  - 15.5 %   Platelets 271 150 - 400 K/uL   Neutrophils Relative % 83 %   Neutro Abs 10.2 (H) 1.7 - 7.7 K/uL   Lymphocytes Relative 8 %   Lymphs Abs 1.0 0.7 - 4.0 K/uL   Monocytes Relative 8 %   Monocytes Absolute 1.0 0.1 - 1.0 K/uL   Eosinophils Relative 1 %   Eosinophils Absolute 0.1 0.0 - 0.7 K/uL   Basophils Relative 0 %   Basophils Absolute 0.0 0.0 - 0.1 K/uL  Urinalysis, Routine w reflex microscopic  Result Value Ref Range   Color, Urine YELLOW YELLOW   APPearance CLEAR CLEAR   Specific Gravity, Urine 1.011 1.005 - 1.030   pH 8.0 5.0 - 8.0   Glucose, UA NEGATIVE NEGATIVE mg/dL   Hgb urine dipstick NEGATIVE NEGATIVE   Bilirubin Urine NEGATIVE NEGATIVE   Ketones, ur NEGATIVE NEGATIVE mg/dL   Protein, ur NEGATIVE NEGATIVE mg/dL   Nitrite NEGATIVE NEGATIVE   Leukocytes, UA NEGATIVE NEGATIVE  I-Stat CG4 Lactic Acid, ED  Result Value Ref Range   Lactic Acid, Venous 1.99 (H) 0.5 - 1.9 mmol/L   Ct Abdomen Pelvis Wo Contrast  Result Date: 07/21/2017 CLINICAL DATA:  Fevers and generalized body aches with dizziness. Patient had endoscopy with esophageal repair 5 days ago. EXAM: CT ABDOMEN AND PELVIS WITHOUT CONTRAST TECHNIQUE: Multidetector CT imaging of the abdomen and pelvis was performed following the standard protocol without IV contrast. COMPARISON:  July 31, 2015 FINDINGS: Lower chest: Opacity in the left lung base could represent atelectasis or infiltrate. A few foci of air are seen in the epicardial fat anterior to the stomach on axial images 1, 2, and 4. No other mediastinal air identified. The distal esophagus is mildly thickened. Lung bases are otherwise unremarkable. Hepatobiliary: The gallbladder is mildly distended with no stones, sludge, or wall thickening identified. The liver is otherwise normal. Pancreas: Unremarkable. No pancreatic ductal dilatation or surrounding inflammatory changes. Spleen: Normal in size without focal abnormality. Adrenals/Urinary Tract: The adrenal  glands are normal. The native kidneys are atrophic. A transplanted kidney is seen in the left side of pelvis. While the renal collecting system is slightly more prominent in the interval, there is no evidence of active obstruction. The bladder is unremarkable. Stomach/Bowel: There is mild thickening of the distal esophagus. The patient is status post gastric surgery and a suture line is identified. There is increased attenuation in the fat adjacent to the proximal stomach and distal esophagus. No extraluminal gas identified in these regions. There is a small fluid collection along the medial aspect of the liver on series 2, image 20 measuring 3.8 by 1.8 cm. No air is seen within within this fluid collection. No other abnormal fluid collections are identified in this region. The remainder of the stomach is unremarkable. The small bowel is normal with no obstruction. The colon is unremarkable as is the appendix. Vascular/Lymphatic: Atherosclerotic changes are seen in the non aneurysmal aorta, iliac vessels, and femoral  vessels. No adenopathy. Reproductive: The patient is status post hysterectomy. Other: There is a fat containing umbilical hernia. No free intra-abdominal air. Musculoskeletal: No acute or significant osseous findings. IMPRESSION: 1. There is opacity in the left lower lobe. Atelectasis is favored over early infiltrate. Recommend clinical correlation. 2. There is distal esophageal and proximal gastric wall thickening. There is fat stranding in the fat adjacent to the distal esophagus and proximal stomach. Given the history of recent esophageal repair 5 days ago, these findings could be postprocedural/surgical in nature. 3. Foci of air in the epicardial fat anteriorly may simply be postsurgical/procedural in nature. If the patient had a recent esophageal injury, this could be residual air. 4. There is a fluid collection along the medial aspect of the liver which does not contain air. Whether this is  sterile or infected cannot be determined on this study. This fluid collection may be postsurgical/procedural in nature. 5. Atherosclerosis. Electronically Signed   By: Dorise Bullion III M.D   On: 07/21/2017 16:46   Dg Chest Port 1 View  Result Date: 07/21/2017 CLINICAL DATA:  Fever. EXAM: PORTABLE CHEST 1 VIEW COMPARISON:  Radiographs of Apr 25, 2017. FINDINGS: Stable cardiomediastinal silhouette. Atherosclerosis of thoracic aorta is noted. No pneumothorax or pleural effusion is noted. No acute pulmonary disease is noted. Bony thorax is unremarkable. IMPRESSION: Aortic atherosclerosis.  No acute cardiopulmonary abnormality seen. Electronically Signed   By: Marijo Conception, M.D.   On: 07/21/2017 15:30   Final Clinical Impressions(s) / ED Diagnoses  Dx #1 sepsis #2Chronic renal insufficiency Final diagnoses:  None  CRITICAL CARE Performed by: Orlie Dakin Total critical care time: 30 minutes Critical care time was exclusive of separately billable procedures and treating other patients. Critical care was necessary to treat or prevent imminent or life-threatening deterioration. Critical care was time spent personally by me on the following activities: development of treatment plan with patient and/or surrogate as well as nursing, discussions with consultants, evaluation of patient's response to treatment, examination of patient, obtaining history from patient or surrogate, ordering and performing treatments and interventions, ordering and review of laboratory studies, ordering and review of radiographic studies, pulse oximetry and re-evaluation of patient's condition.  New Prescriptions New Prescriptions   No medications on file     Orlie Dakin, MD 07/21/17 1724 Addendum 6:45 PM requesting medicine for epigastric pain. Intravenous morphine ordered. Patient is awake alert Glasgow Coma Score 15   Orlie Dakin, MD 07/21/17 [phone]

## 2017-07-21 NOTE — ED Triage Notes (Signed)
Patient c/o fevers with generalized body aches and dizziness. Per patient had endoscopy with esophageal repair on Tuesday. Per patient fevers started last night. Denies any nausea or vomiting. Patient reports taking 500mg  PO Tylenol q 4hrs with no relief. Patient tried contact [facility], where she had surgery, multiple times but no one will call her back.

## 2017-07-24 ENCOUNTER — Encounter (HOSPITAL_COMMUNITY): Payer: Self-pay

## 2017-07-24 ENCOUNTER — Ambulatory Visit (HOSPITAL_COMMUNITY): Payer: Medicare HMO

## 2017-07-26 LAB — CULTURE, BLOOD (ROUTINE X 2)
Culture: NO GROWTH
Special Requests: ADEQUATE

## 2017-12-10 ENCOUNTER — Inpatient Hospital Stay (HOSPITAL_COMMUNITY)
Admission: AD | Admit: 2017-12-10 | Discharge: 2017-12-10 | Disposition: A | Discharge status: Home / Self Care | Payer: BLUE CROSS/BLUE SHIELD | Source: Ambulatory Visit | Attending: Obstetrics and Gynecology | Admitting: Obstetrics and Gynecology

## 2017-12-10 ENCOUNTER — Encounter (HOSPITAL_COMMUNITY): Payer: Self-pay | Admitting: *Deleted

## 2017-12-10 ENCOUNTER — Other Ambulatory Visit: Payer: Self-pay

## 2017-12-10 DIAGNOSIS — B9689 Other specified bacterial agents as the cause of diseases classified elsewhere: Secondary | ICD-10-CM | POA: Diagnosis not present

## 2017-12-10 DIAGNOSIS — N760 Acute vaginitis: Secondary | ICD-10-CM | POA: Diagnosis not present

## 2017-12-10 DIAGNOSIS — N939 Abnormal uterine and vaginal bleeding, unspecified: Secondary | ICD-10-CM | POA: Insufficient documentation

## 2017-12-10 DIAGNOSIS — Z90710 Acquired absence of both cervix and uterus: Secondary | ICD-10-CM | POA: Diagnosis not present

## 2017-12-10 LAB — URINALYSIS, ROUTINE W REFLEX MICROSCOPIC
Bilirubin Urine: NEGATIVE
Glucose, UA: NEGATIVE mg/dL
Hgb urine dipstick: NEGATIVE
Ketones, ur: NEGATIVE mg/dL
Nitrite: NEGATIVE
Protein, ur: NEGATIVE mg/dL
Specific Gravity, Urine: 1.019 (ref 1.005–1.030)
pH: 5 (ref 5.0–8.0)

## 2017-12-10 LAB — CBC
HCT: 33.8 % — ABNORMAL LOW (ref 36.0–46.0)
Hemoglobin: 10.4 g/dL — ABNORMAL LOW (ref 12.0–15.0)
MCH: 26.5 pg (ref 26.0–34.0)
MCHC: 30.8 g/dL (ref 30.0–36.0)
MCV: 86.2 fL (ref 78.0–100.0)
Platelets: 218 10*3/uL (ref 150–400)
RBC: 3.92 MIL/uL (ref 3.87–5.11)
RDW: 14.2 % (ref 11.5–15.5)
WBC: 8 10*3/uL (ref 4.0–10.5)

## 2017-12-10 LAB — WET PREP, GENITAL
Sperm: NONE SEEN
Trich, Wet Prep: NONE SEEN
Yeast Wet Prep HPF POC: NONE SEEN

## 2017-12-10 MED ORDER — METRONIDAZOLE 500 MG PO TABS: 500.0000 mg | ORAL_TABLET | Freq: Two times a day (BID) | ORAL | 0 refills | Status: AC

## 2017-12-10 NOTE — MAU Provider Note (Signed)
History  CSN: [id number] Arrival date and time: 12/10/17 1901  First Provider Initiated Contact with Patient 12/10/17 2021      Chief Complaint  Patient presents with  . Vaginal Itching  . Vaginal Bleeding    HPI: [name] is a 41 y.o. with PMH of cervical cancer, ?uterine cancer, s/p total abdominal hysterectomy 5 year ago, who presents to maternity admissions reporting vaginal bleeding. She reports bleeding started about 10 days ago, started as light spotting, but got heavier the last couple of days. Reports she went through 3 pads today. She also reports some vulvar itching, but reports she thinks this is due to using pad constantly the last few days. Reports noting some vaginal odor as well. Denies fever, chills, urinary symptoms, constipation, diarrhea, N/V, or hemorrhoids.    OB History  Gravida Para Term Preterm AB Living  1       1 0  SAB TAB Ectopic Multiple Live Births  1            # Outcome Date GA Lbr Len/2nd Weight Sex Delivery Anes PTL Lv  1 SAB              Past Medical History:  Diagnosis Date  . Anxiety    OCD  . Asthma    as a child  . Cancer [facility]) 2001   renal cell , breast cancer both breast 2011, cervical cancer 2013  . Chronic anemia   . Chronic pain   . Complication of anesthesia    hard to sedate dr hung aware  . Crohn's disease ([place])   . Diabetes mellitus without complication ([place])   . Fracture 05/01/2013   Right foot, in cast  . History of blood transfusion 2 years ago  . Hyperlipidemia   . Hypothyroidism    parathroid runs low  . Meningioma ([place])   . Panic disorder   . PONV (postoperative nausea and vomiting)   . Renal disorder    S/p '11-Kidney transplant-Dr. Florene Glen follows  . Urticaria    Past Surgical History:  Procedure Laterality Date  . ABDOMINAL HYSTERECTOMY  02/13/2012   Procedure: HYSTERECTOMY ABDOMINAL;  Surgeon: Cyril Mourning, MD;  Location: [facility];  Service: Gynecology;  Laterality: N/A;  . BRAVO [place] STUDY N/A  11/27/2013   Procedure: BRAVO [place];  Surgeon: Beryle Beams, MD;  Location: [facility];  Service: Endoscopy;  Laterality: N/A;  . BRAVO [place] STUDY N/A 12/11/2013   Procedure: BRAVO [place];  Surgeon: Beryle Beams, MD;  Location: [facility];  Service: Endoscopy;  Laterality: N/A;  . breast lumpectomy and reconstruction Bilateral 2011  . BREAST SURGERY     bilateral lumpectomy 4'11  . COLONOSCOPY N/A 09/25/2013   Procedure: COLONOSCOPY;  Surgeon: Beryle Beams, MD;  Location: [facility];  Service: Endoscopy;  Laterality: N/A;  . COLONOSCOPY WITH PROPOFOL N/A 07/29/2015   Procedure: COLONOSCOPY WITH PROPOFOL;  Surgeon: Carol Ada, MD;  Location: [facility];  Service: Endoscopy;  Laterality: N/A;  . CYSTOSCOPY  02/13/2012   Procedure: CYSTOSCOPY;  Surgeon: Franchot Gallo, MD;  Location: [facility];  Service: Urology;  Laterality: N/A;  insertion of ureteral catheter , removed per Dr Diona Fanti   . ESOPHAGEAL MANOMETRY N/A 08/10/2013   Procedure: ESOPHAGEAL MANOMETRY (EM);  Surgeon: Beryle Beams, MD;  Location: [facility];  Service: Endoscopy;  Laterality: N/A;  . ESOPHAGOGASTRODUODENOSCOPY N/A 09/25/2013   Procedure: ESOPHAGOGASTRODUODENOSCOPY (EGD);  Surgeon: Beryle Beams, MD;  Location: [facility];  Service: Endoscopy;  Laterality: N/A;  . ESOPHAGOGASTRODUODENOSCOPY N/A 11/27/2013   Procedure: ESOPHAGOGASTRODUODENOSCOPY (EGD);  Surgeon: Beryle Beams, MD;  Location: [facility];  Service: Endoscopy;  Laterality: N/A;  . ESOPHAGOGASTRODUODENOSCOPY N/A 12/11/2013   Procedure: ESOPHAGOGASTRODUODENOSCOPY (EGD);  Surgeon: Beryle Beams, MD;  Location: [facility];  Service: Endoscopy;  Laterality: N/A;  . GROIN DISSECTION Left 10/09/2013   Procedure: INCISION AND DRAINAGE OF LEFT GROIN ABSCESS;  Surgeon: Shann Medal, MD;  Location: [facility];  Service: General;  Laterality: Left;  . INCISION AND DRAINAGE ABSCESS N/A 08/26/2013   Procedure: INCISION AND DRAINAGE PERIRECTAL ABSCESS;   Surgeon: Jamesetta So, MD;  Location: [facility];  Service: General;  Laterality: N/A;  . KNEE SURGERY Left april 2003   arthroscopy  . LOWER EXTREMITY ARTERIAL DOPLLERS  05/06/2012   Bilat ABIs were attempted-not accessible due to calcified veins. Bilat TBIs demonstrated no obtainable flow through both great toes. Bilat PVR ankle regions demonstrated moderately abnormal pulsatile flow. Lft SFA demonstrated a 50-69% diameter reduction. Lft runoff demonstrated occlusive diseasewith reconstitution of flow noted distally.  Marland [place] and 2011   last '11-[facility]  . PARATHYROIDECTOMY  02-20-2001  . SLEEVE GASTROPLASTY     for GERD, hiatal hernia  . TUBAL LIGATION  02-19-2000   Social History   Socioeconomic History  . Marital status: Single    Spouse name: Not on file  . Number of children: Not on file  . Years of education: Not on file  . Highest education level: Not on file  Social Needs  . Financial resource strain: Not on file  . Food insecurity - worry: Not on file  . Food insecurity - inability: Not on file  . Transportation needs - medical: Not on file  . Transportation needs - non-medical: Not on file  Occupational History  . Not on file  Tobacco Use  . Smoking status: Never Smoker  . Smokeless tobacco: Never Used  Substance and Sexual Activity  . Alcohol use: No  . Drug use: No  . Sexual activity: Not Currently  Other Topics Concern  . Not on file  Social History Narrative  . Not on file   Allergies  Allergen Reactions  . Barium-Containing Compounds Anaphylaxis  . Dilaudid [Hydromorphone Hcl] Anaphylaxis    Pt states it makes anxious and itchy   . Ivp Dye [Iodinated Diagnostic Agents] Anaphylaxis  . Shellfish Allergy Shortness Of Breath, Itching and Rash  . Vancomycin Other (See Comments)    Redman syndrome.   . Aspirin Other (See Comments)    Due to multiple gastric issues  . Adhesive [Tape] Dermatitis  . Infed [Iron Dextran] Itching    . Sulfa Antibiotics Hives  . Iopamidol Itching and Rash    Medications Prior to Admission  Medication Sig Dispense Refill Last Dose  . acetaminophen (TYLENOL) 500 MG tablet Take 1,000 mg by mouth every 4 (four) hours as needed for moderate pain.    Past Week at Unknown time  . acyclovir ointment (ZOVIRAX) 5 % Apply 1 application topically 3 (three) times daily as needed (outbreak).    Past Month at Unknown time  . albuterol (PROAIR HFA) 108 (90 Base) MCG/ACT inhaler Inhale 2 puffs into the lungs every 4 (four) hours as needed for wheezing or shortness of breath. 1 Inhaler 1 months at Unknown time  . ALPRAZolam (XANAX) 0.5 MG tablet Take 0.25-0.5 mg by mouth 3 (three) times daily as needed for anxiety or sleep.  12/09/2017 at Unknown time  . calcitRIOL (ROCALTROL) 0.25 MCG capsule Take 4 capsules by mouth 2 (two) times daily.   12/10/2017 at Unknown time  . calcium carbonate (TUMS) 500 MG chewable tablet Chew 1-2 tablets by mouth daily as needed for heartburn.    Past Month at Unknown time  . cyclobenzaprine (FLEXERIL) 10 MG tablet Take 10 mg by mouth 2 (two) times daily as needed for muscle spasms.    Past Week at Unknown time  . dicyclomine (BENTYL) 10 MG capsule Take 10 mg by mouth 4 (four) times daily -  before meals and at bedtime.   12/10/2017 at Unknown time  . EPINEPHrine 0.3 mg/0.3 mL IJ SOAJ injection Use as directed, if administer call 911   never used  . fluocinonide cream (LIDEX) 4.76 % Apply 1 application topically daily as needed (for irritation).    Past Month at Unknown time  . FLUoxetine (PROZAC) 20 MG capsule Take 40 mg by mouth 2 (two) times daily.    12/10/2017 at Unknown time  . hydrocortisone 2.5 % ointment Apply 1 application topically daily as needed.   Past Month at Unknown time  . hydrOXYzine (ATARAX/VISTARIL) 50 MG tablet Take 50 mg by mouth at bedtime.    12/09/2017 at Unknown time  . linaclotide (LINZESS) 145 MCG CAPS capsule Take 145 mcg by mouth daily as needed  (for constipation).   3 months at Unknown time  . loratadine (CLARITIN) 10 MG tablet Take 10 mg by mouth daily.   12/09/2017 at Unknown time  . LORazepam (ATIVAN) 0.5 MG tablet Take 0.5 mg by mouth at bedtime as needed for sleep.    Past Month at Unknown time  . magic mouthwash SOLN Take 5 mLs by mouth every 4 (four) hours as needed.   Past Month at Unknown time  . metFORMIN (GLUCOPHAGE) 500 MG tablet Take 500 mg by mouth daily with breakfast.   12/10/2017 at Unknown time  . mycophenolate (MYFORTIC) 180 MG EC tablet Take 540 mg by mouth 2 (two) times daily.    12/10/2017 at Unknown time  . omeprazole (PRILOSEC) 40 MG capsule Take 40 mg by mouth 2 (two) times daily.    12/10/2017 at Unknown time  . ondansetron (ZOFRAN) 8 MG tablet Take by mouth every 8 (eight) hours as needed for nausea or vomiting.   12/10/2017 at Unknown time  . oseltamivir (TAMIFLU) 75 MG capsule Take 75 mg by mouth daily.   12/10/2017 at Unknown time  . oxyCODONE (OXY IR/ROXICODONE) 5 MG immediate release tablet Take 5 mg by mouth at bedtime as needed for severe pain.   Past Month at Unknown time  . predniSONE (DELTASONE) 10 MG tablet Take 10 mg by mouth daily with breakfast.    12/10/2017 at Unknown time  . promethazine (PHENERGAN) 25 MG tablet Take 1 tablet (25 mg total) by mouth every 6 (six) hours as needed for nausea. 10 tablet 0 Past Week at Unknown time  . rosuvastatin (CRESTOR) 5 MG tablet Take 5 mg by mouth at bedtime.   12/09/2017 at Unknown time  . tacrolimus (PROGRAF) 1 MG capsule Take 3 mg by mouth 2 (two) times daily.   12/10/2017 at Unknown time  . valACYclovir (VALTREX) 500 MG tablet Take 500 mg by mouth. 2 on the first day then 1 tab day 2-5   Past Month at Unknown time  . Vitamin D, Ergocalciferol, (DRISDOL) 50000 units CAPS capsule Take 50,000 Units by mouth every Monday.    12/09/2017  at Unknown time  . diphenhydrAMINE (BENADRYL) 25 MG tablet Take 1 tablet (25 mg total) by mouth every 6 (six) hours. (Patient  not taking: Reported on 04/25/2017) 20 tablet 0 Not Taking at Unknown time    I have reviewed patient's Past Medical Hx, Surgical Hx, Family Hx, Social Hx, medications and allergies.   Review of Systems: Negative except for what is mentioned in HPI.  Physical Exam   Blood pressure 123/66, pulse 82, temperature 97.9 F (36.6 C), temperature source Oral, resp. rate 16, height 5\' 3"  (1.6 m), weight 173 lb 4 oz (78.6 kg), last menstrual period 01/30/2012, SpO2 100 %.  Constitutional: Well-developed, well-nourished female in no acute distress.  HENT: [place]/AT, normal oropharynx mucosa. MMM Eyes: normal conjunctivae, no scleral icterus Cardiovascular: normal rate, regular rhythm Respiratory: normal effort, lungs CTAB.  GI: Abd soft, non-tender, non-distended, normoactive bowel sounds GU: Neg CVAT. Pelvic: NEFG. Normal vaginal mucosa without lesions; no blood in vaginal vault; upon further inspection of vaginal cuff,  granulomatous-like lesion on left corner of vaginal cuff, slightly friable with manupilation, as pictured below. Vaginal cuff otherwise normal appearing.  MSK: Extremities nontender, no edema Neurologic: Alert and oriented x 4. Psych: Normal mood and affect Skin: warm and dry      MAU Course/MDM:   Nursing notes and VS reviewed. Patient seen and examined, as noted above.  Cultures collected Patient is hemodynamically stable. Exam as above. No significant bleeding here.  CBC shows Hgb of 10.4 (from 10.5 in July); She has chronic anemia   Wet prep with clue cells. Will treat for BV given that she is having vaginal irritation and odor.   Discussed with Dr. Matthew Saras. Will d/c home with follow with Dr. Helane Rima.   Assessment and Plan  Assessment: 1. Vaginal bleeding   2. History of total hysterectomy   3. Bacterial vaginosis     Plan: --Discharge home in stable condition.  --F/u with Dr. Helane Rima outpatient --Dicussed return precautions.   Degele, Jenne Pane,  MD 12/10/2017 10:11 PM  Follow-up Information    Dian Queen, MD. Schedule an appointment as soon as possible for a visit.   Specialty:  Obstetrics and Gynecology Contact information: [address] [place] [address] [phone]

## 2017-12-10 NOTE — MAU Note (Signed)
Been having vag bleeding x 10 days, ranged from Light pink bleeding and then dark red today for few days.  Hysterectomy 15-20 years ago. Vaginal itching now, thinks it is due to pads.

## 2017-12-10 NOTE — Discharge Instructions (Signed)

## 2017-12-11 LAB — GC/CHLAMYDIA PROBE AMP (~~LOC~~) NOT AT ARMC
Chlamydia: NEGATIVE
Neisseria Gonorrhea: NEGATIVE

## 2018-05-07 DIAGNOSIS — N186 End stage renal disease: Secondary | ICD-10-CM | POA: Diagnosis not present

## 2018-05-07 DIAGNOSIS — Z79899 Other long term (current) drug therapy: Secondary | ICD-10-CM | POA: Diagnosis not present

## 2018-05-07 DIAGNOSIS — Z853 Personal history of malignant neoplasm of breast: Secondary | ICD-10-CM | POA: Diagnosis not present

## 2018-05-07 DIAGNOSIS — Z8541 Personal history of malignant neoplasm of cervix uteri: Secondary | ICD-10-CM | POA: Insufficient documentation

## 2018-05-07 DIAGNOSIS — R509 Fever, unspecified: Secondary | ICD-10-CM | POA: Diagnosis present

## 2018-05-07 DIAGNOSIS — Z7984 Long term (current) use of oral hypoglycemic drugs: Secondary | ICD-10-CM | POA: Diagnosis not present

## 2018-05-07 DIAGNOSIS — E1122 Type 2 diabetes mellitus with diabetic chronic kidney disease: Secondary | ICD-10-CM | POA: Insufficient documentation

## 2018-05-07 DIAGNOSIS — E039 Hypothyroidism, unspecified: Secondary | ICD-10-CM | POA: Diagnosis not present

## 2018-05-07 DIAGNOSIS — Z85528 Personal history of other malignant neoplasm of kidney: Secondary | ICD-10-CM | POA: Diagnosis not present

## 2018-05-07 DIAGNOSIS — J45909 Unspecified asthma, uncomplicated: Secondary | ICD-10-CM | POA: Insufficient documentation

## 2018-05-07 DIAGNOSIS — B349 Viral infection, unspecified: Secondary | ICD-10-CM | POA: Insufficient documentation

## 2018-05-07 DIAGNOSIS — Z940 Kidney transplant status: Secondary | ICD-10-CM | POA: Diagnosis not present

## 2018-05-07 DIAGNOSIS — I120 Hypertensive chronic kidney disease with stage 5 chronic kidney disease or end stage renal disease: Secondary | ICD-10-CM | POA: Diagnosis not present

## 2018-05-08 ENCOUNTER — Emergency Department (HOSPITAL_COMMUNITY): Payer: BLUE CROSS/BLUE SHIELD

## 2018-05-08 ENCOUNTER — Encounter (HOSPITAL_COMMUNITY): Payer: Self-pay | Admitting: *Deleted

## 2018-05-08 ENCOUNTER — Other Ambulatory Visit: Payer: Self-pay

## 2018-05-08 ENCOUNTER — Emergency Department (HOSPITAL_COMMUNITY)
Admission: EM | Admit: 2018-05-08 | Discharge: 2018-05-08 | Disposition: A | Discharge status: Home / Self Care | Payer: BLUE CROSS/BLUE SHIELD | Attending: Emergency Medicine | Admitting: Emergency Medicine

## 2018-05-08 DIAGNOSIS — J069 Acute upper respiratory infection, unspecified: Secondary | ICD-10-CM

## 2018-05-08 DIAGNOSIS — B349 Viral infection, unspecified: Secondary | ICD-10-CM

## 2018-05-08 LAB — CBC WITH DIFFERENTIAL/PLATELET
Basophils Absolute: 0 10*3/uL (ref 0.0–0.1)
Basophils Relative: 0 %
Eosinophils Absolute: 0 10*3/uL (ref 0.0–0.7)
Eosinophils Relative: 0 %
HCT: 32.9 % — ABNORMAL LOW (ref 36.0–46.0)
Hemoglobin: 10.4 g/dL — ABNORMAL LOW (ref 12.0–15.0)
Lymphocytes Relative: 13 %
Lymphs Abs: 1.2 10*3/uL (ref 0.7–4.0)
MCH: 26.8 pg (ref 26.0–34.0)
MCHC: 31.6 g/dL (ref 30.0–36.0)
MCV: 84.8 fL (ref 78.0–100.0)
Monocytes Absolute: 0.8 10*3/uL (ref 0.1–1.0)
Monocytes Relative: 9 %
Neutro Abs: 7 10*3/uL (ref 1.7–7.7)
Neutrophils Relative %: 78 %
Platelets: 233 10*3/uL (ref 150–400)
RBC: 3.88 MIL/uL (ref 3.87–5.11)
RDW: 14.1 % (ref 11.5–15.5)
WBC: 8.9 10*3/uL (ref 4.0–10.5)

## 2018-05-08 LAB — RESPIRATORY PANEL BY PCR

## 2018-05-08 LAB — COMPREHENSIVE METABOLIC PANEL
ALT: 11 U/L — ABNORMAL LOW (ref 14–54)
AST: 12 U/L — ABNORMAL LOW (ref 15–41)
Albumin: 3.8 g/dL (ref 3.5–5.0)
Alkaline Phosphatase: 52 U/L (ref 38–126)
Anion gap: 9 (ref 5–15)
BUN: 18 mg/dL (ref 6–20)
CO2: 25 mmol/L (ref 22–32)
Calcium: 8.8 mg/dL — ABNORMAL LOW (ref 8.9–10.3)
Chloride: 102 mmol/L (ref 101–111)
Creatinine, Ser: 1.38 mg/dL — ABNORMAL HIGH (ref 0.44–1.00)
GFR calc Af Amer: 53 mL/min — ABNORMAL LOW (ref >60)
GFR calc non Af Amer: 46 mL/min — ABNORMAL LOW (ref >60)
Glucose, Bld: 130 mg/dL — ABNORMAL HIGH (ref 65–99)
Potassium: 3.9 mmol/L (ref 3.5–5.1)
Sodium: 136 mmol/L (ref 135–145)
Total Bilirubin: 0.6 mg/dL (ref 0.3–1.2)
Total Protein: 6.7 g/dL (ref 6.5–8.1)

## 2018-05-08 LAB — URINALYSIS, ROUTINE W REFLEX MICROSCOPIC
Bilirubin Urine: NEGATIVE
Glucose, UA: NEGATIVE mg/dL
Ketones, ur: NEGATIVE mg/dL
Leukocytes, UA: NEGATIVE
Nitrite: NEGATIVE
Protein, ur: NEGATIVE mg/dL
Specific Gravity, Urine: 1.011 (ref 1.005–1.030)
pH: 5 (ref 5.0–8.0)

## 2018-05-08 LAB — I-STAT CG4 LACTIC ACID, ED
Lactic Acid, Venous: 1.11 mmol/L (ref 0.5–1.9)
Lactic Acid, Venous: 0.8 mmol/L (ref 0.5–1.9)

## 2018-05-08 LAB — GROUP A STREP BY PCR: Group A Strep by PCR: NOT DETECTED

## 2018-05-08 MED ORDER — MORPHINE SULFATE (PF) 4 MG/ML IV SOLN: 4.0000 mg | Freq: Once | INTRAVENOUS | Status: DC

## 2018-05-08 MED ORDER — LACTATED RINGERS IV BOLUS
1000.0000 mL | Freq: Once | INTRAVENOUS | Status: AC
  Administered 2018-05-08: 1000 mL via INTRAVENOUS

## 2018-05-08 MED ORDER — ACETAMINOPHEN ER 650 MG PO TBCR: 650.0000 mg | EXTENDED_RELEASE_TABLET | Freq: Three times a day (TID) | ORAL | 0 refills | Status: AC

## 2018-05-08 MED ORDER — FENTANYL CITRATE (PF) 100 MCG/2ML IJ SOLN: 75.0000 ug | Freq: Once | INTRAMUSCULAR | Status: DC

## 2018-05-08 MED ORDER — MORPHINE SULFATE (PF) 4 MG/ML IV SOLN
4.0000 mg | Freq: Once | INTRAVENOUS | Status: AC
  Administered 2018-05-08: 4 mg via INTRAVENOUS
  Filled 2018-05-08: qty 1

## 2018-05-08 MED ORDER — ACETAMINOPHEN 325 MG PO TABS
650.0000 mg | ORAL_TABLET | Freq: Once | ORAL | Status: AC
  Administered 2018-05-08: 650 mg via ORAL
  Filled 2018-05-08: qty 2

## 2018-05-08 NOTE — ED Provider Notes (Addendum)
[facility] EMERGENCY DEPARTMENT Provider Note   CSN: [id number] Arrival date & time: 05/07/18  2355     History   Chief Complaint Chief Complaint  Patient presents with  . Fever    HPI [name] is a 42 y.o. female.  HPI  42 year old female with history of renal cell carcinoma status post bilateral kidney transplant, with the last 40 being in 2011, Crohn's disease, diabetes comes in with chief complaint of fevers.  Patient states that she woke up at 3 AM yesterday with weakness.  Patient since then started developing fevers, generalized body aches, cough, sore throat.  Patient saw her PCP in the morning who checked her for influenza and strep, both of which were negative.  Patient was advised to come to the ER if her symptoms get worse, and her pain has indeed gotten worse.  Review of system is positive for generalized myalgias and arthralgias.  Patient is having a sore throat, runny nose and hoarse voice with chills.  She denies any abdominal pain, nausea, vomiting, diarrhea, rash, diophoresis.  She has headaches and neck pain, but they are not severe.  No associated confusion.    Patient works as a Pharmacist, hospital and therefore she is sure she has been around someone sick.  Patient had a fever of 102 and she took Tylenol prior to ED arrival.  Past Medical History:  Diagnosis Date  . Anxiety    OCD  . Asthma    as a child  . Cancer [facility]) 2001   renal cell , breast cancer both breast 2011, cervical cancer 2013  . Chronic anemia   . Chronic pain   . Complication of anesthesia    hard to sedate dr hung aware  . Crohn's disease ([place])   . Diabetes mellitus without complication ([place])   . Fracture 05/01/2013   Right foot, in cast  . History of blood transfusion 2 years ago  . Hyperlipidemia   . Hypothyroidism    parathroid runs low  . Meningioma ([place])   . Panic disorder   . PONV (postoperative nausea and vomiting)   . Renal disorder    S/p '11-Kidney transplant-Dr. Florene Glen  follows  . Urticaria     Patient Active Problem List   Diagnosis Date Noted  . UTI (lower urinary tract infection) 11/20/2013  . CKD (chronic kidney disease) stage 3, GFR 30-59 ml/min (HCC) 08/25/2013  . Perirectal abscess 08/24/2013  . ESRD (end stage renal disease) ([place]) 03/27/2011  . Kidney transplant status, living unrelated donor 03/27/2011  . Anemia 03/27/2011  . Type 2 diabetes mellitus ([place]) 03/27/2011  . Osteoarthritis of right knee 03/27/2011  . Lumbago 03/27/2011  . HTN (hypertension) 03/27/2011  . HLD (hyperlipidemia) 03/27/2011  . Generalized anxiety disorder 03/27/2011  . Depression 03/27/2011  . Chronic steroid use 03/27/2011  . Crohn's disease ([place]) 03/27/2011  . OCD (obsessive compulsive disorder) 03/27/2011  . 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 Obstructive sleep apnea, adult 03/27/2011    Past Surgical History:  Procedure Laterality Date  . ABDOMINAL HYSTERECTOMY  02/13/2012   Procedure: HYSTERECTOMY ABDOMINAL;  Surgeon: Cyril Mourning, MD;  Location: [facility];  Service: Gynecology;  Laterality: N/A;  . BRAVO [place] STUDY N/A 11/27/2013   Procedure: BRAVO [place];  Surgeon: Beryle Beams, MD;  Location: [facility];  Service: Endoscopy;  Laterality: N/A;  . BRAVO [place] STUDY N/A 12/11/2013   Procedure: BRAVO [place];  Surgeon: Beryle Beams, MD;  Location: [facility];  Service: Endoscopy;  Laterality: N/A;  . breast lumpectomy and reconstruction Bilateral 2011  . BREAST SURGERY     bilateral lumpectomy 4'11  . COLONOSCOPY N/A 09/25/2013   Procedure: COLONOSCOPY;  Surgeon: Beryle Beams, MD;  Location: [facility];  Service: Endoscopy;  Laterality: N/A;  . COLONOSCOPY WITH PROPOFOL N/A 07/29/2015   Procedure: COLONOSCOPY WITH PROPOFOL;  Surgeon: Carol Ada, MD;  Location: [facility];  Service: Endoscopy;  Laterality: N/A;  . CYSTOSCOPY  02/13/2012   Procedure: CYSTOSCOPY;  Surgeon: Franchot Gallo, MD;  Location: [facility];  Service: Urology;   Laterality: N/A;  insertion of ureteral catheter , removed per Dr Diona Fanti   . ESOPHAGEAL MANOMETRY N/A 08/10/2013   Procedure: ESOPHAGEAL MANOMETRY (EM);  Surgeon: Beryle Beams, MD;  Location: [facility];  Service: Endoscopy;  Laterality: N/A;  . ESOPHAGOGASTRODUODENOSCOPY N/A 09/25/2013   Procedure: ESOPHAGOGASTRODUODENOSCOPY (EGD);  Surgeon: Beryle Beams, MD;  Location: [facility];  Service: Endoscopy;  Laterality: N/A;  . ESOPHAGOGASTRODUODENOSCOPY N/A 11/27/2013   Procedure: ESOPHAGOGASTRODUODENOSCOPY (EGD);  Surgeon: Beryle Beams, MD;  Location: [facility];  Service: Endoscopy;  Laterality: N/A;  . ESOPHAGOGASTRODUODENOSCOPY N/A 12/11/2013   Procedure: ESOPHAGOGASTRODUODENOSCOPY (EGD);  Surgeon: Beryle Beams, MD;  Location: [facility];  Service: Endoscopy;  Laterality: N/A;  . GROIN DISSECTION Left 10/09/2013   Procedure: INCISION AND DRAINAGE OF LEFT GROIN ABSCESS;  Surgeon: Shann Medal, MD;  Location: [facility];  Service: General;  Laterality: Left;  . INCISION AND DRAINAGE ABSCESS N/A 08/26/2013   Procedure: INCISION AND DRAINAGE PERIRECTAL ABSCESS;  Surgeon: Jamesetta So, MD;  Location: [facility];  Service: General;  Laterality: N/A;  . KNEE SURGERY Left april 2003   arthroscopy  . LOWER EXTREMITY ARTERIAL DOPLLERS  05/06/2012   Bilat ABIs were attempted-not accessible due to calcified veins. Bilat TBIs demonstrated no obtainable flow through both great toes. Bilat PVR ankle regions demonstrated moderately abnormal pulsatile flow. Lft SFA demonstrated a 50-69% diameter reduction. Lft runoff demonstrated occlusive diseasewith reconstitution of flow noted distally.  Marland Kitchen [place] and 2011   last '11-[facility]  . PARATHYROIDECTOMY  02-20-2001  . SLEEVE GASTROPLASTY     for GERD, hiatal hernia  . TUBAL LIGATION  02-19-2000     OB History    Gravida  1   Para      Term      Preterm      AB  1   Living  0     SAB  1   TAB      Ectopic        Multiple      Live Births               Home Medications    Prior to Admission medications   Medication Sig Start Date End Date Taking? Authorizing Provider  acetaminophen (TYLENOL 8 HOUR) 650 MG CR tablet Take 1 tablet (650 mg total) by mouth every 8 (eight) hours. 05/08/18   Varney Biles, MD  acyclovir ointment (ZOVIRAX) 5 % Apply 1 application topically 3 (three) times daily as needed (outbreak).     [provider]  albuterol (PROAIR HFA) 108 (90 Base) MCG/ACT inhaler Inhale 2 puffs into the lungs every 4 (four) hours as needed for wheezing or shortness of breath. 04/11/17   Valentina Shaggy, MD  ALPRAZolam Duanne Moron) 0.5 MG tablet Take 0.25-0.5 mg by mouth 3 (three) times daily as needed for anxiety or sleep.     [provider]  calcitRIOL (ROCALTROL) 0.25 MCG capsule Take 4 capsules by mouth 2 (two) times daily. 04/04/12   [provider]  calcium carbonate (TUMS) 500 MG chewable tablet Chew 1-2 tablets by mouth daily as needed for heartburn.  04/04/12   [provider]  cyclobenzaprine (FLEXERIL) 10 MG tablet Take 10 mg by mouth 2 (two) times daily as needed for muscle spasms.  05/28/17   [provider]  dicyclomine (BENTYL) 10 MG capsule Take 10 mg by mouth 4 (four) times daily -  before meals and at bedtime.    [provider]  diphenhydrAMINE (BENADRYL) 25 MG tablet Take 1 tablet (25 mg total) by mouth every 6 (six) hours. Patient not taking: Reported on 04/25/2017 02/07/17   Macarthur Critchley, MD  EPINEPHrine 0.3 mg/0.3 mL IJ SOAJ injection Use as directed, if administer call 911 02/08/17   [provider]  fluocinonide cream (LIDEX) 0.01 % Apply 1 application topically daily as needed (for irritation).     [provider]  FLUoxetine (PROZAC) 20 MG capsule Take 40 mg by mouth 2 (two) times daily.     [provider]  hydrocortisone 2.5 % ointment Apply 1 application topically daily as needed.     [provider]  hydrOXYzine (ATARAX/VISTARIL) 50 MG tablet Take 50 mg by mouth at bedtime.     [provider]  linaclotide (LINZESS) 145 MCG CAPS capsule Take 145 mcg by mouth daily as needed (for constipation).    [provider]  loratadine (CLARITIN) 10 MG tablet Take 10 mg by mouth daily.    [provider]  LORazepam (ATIVAN) 0.5 MG tablet Take 0.5 mg by mouth at bedtime as needed for sleep.     [provider]  magic mouthwash SOLN Take 5 mLs by mouth every 4 (four) hours as needed. 04/12/16   [provider]  metFORMIN (GLUCOPHAGE) 500 MG tablet Take 500 mg by mouth daily with breakfast.    [provider]  mycophenolate (MYFORTIC) 180 MG EC tablet Take 540 mg by mouth 2 (two) times daily.     Niel Hummer, NP  omeprazole (PRILOSEC) 40 MG capsule Take 40 mg by mouth 2 (two) times daily.     [provider]  ondansetron (ZOFRAN) 8 MG tablet Take by mouth every 8 (eight) hours as needed for nausea or vomiting.    [provider]  oseltamivir (TAMIFLU) 75 MG capsule Take 75 mg by mouth daily. 12/05/17   [provider]  oxyCODONE (OXY IR/ROXICODONE) 5 MG immediate release tablet Take 5 mg by mouth at bedtime as needed for severe pain.    [provider]  predniSONE (DELTASONE) 10 MG tablet Take 10 mg by mouth daily with breakfast.     [provider]  promethazine (PHENERGAN) 25 MG tablet Take 1 tablet (25 mg total) by mouth every 6 (six) hours as needed for nausea. 06/11/17   Davonna Belling, MD  rosuvastatin (CRESTOR) 5 MG tablet Take 5 mg by mouth at bedtime.    [provider]  tacrolimus (PROGRAF) 1 MG capsule Take 3 mg by mouth 2 (two) times daily.    [provider]  valACYclovir (VALTREX) 500 MG tablet Take 500 mg by mouth. 2 on the first day then 1 tab day 2-5    [provider]  Vitamin D, Ergocalciferol, (DRISDOL) 50000 units CAPS capsule Take  50,000 Units by mouth every Monday.     [provider]  Family History Family History  Problem Relation Age of Onset  . Diabetes Mother   . Hypertension Mother   . Asthma Mother   . Diabetes Father   . Hypertension Father   . Cancer Father        Prostate cancer  . Asthma Father   . Obesity Sister   . Eczema Sister   . Stroke Maternal Grandmother   . Heart attack Maternal Grandfather   . Polycystic ovary syndrome Sister     Social History Social History   Tobacco Use  . Smoking status: Never Smoker  . Smokeless tobacco: Never Used  Substance Use Topics  . Alcohol use: No  . Drug use: No     Allergies   Barium-containing compounds; Dilaudid [hydromorphone hcl]; Ivp dye [iodinated diagnostic agents]; Shellfish allergy; Vancomycin; Aspirin; Adhesive [tape]; Infed [iron dextran]; Sulfa antibiotics; and Iopamidol   Review of Systems Review of Systems  Constitutional: Positive for activity change and fever.  HENT: Positive for congestion, rhinorrhea, sore throat and voice change. Negative for trouble swallowing.   Eyes: Negative for visual disturbance.  Respiratory: Positive for cough. Negative for shortness of breath.   Gastrointestinal: Negative for diarrhea, nausea and vomiting.  Genitourinary: Negative for dysuria, frequency and hematuria.  Musculoskeletal: Positive for arthralgias and myalgias.  Skin: Negative for rash.  Allergic/Immunologic: Positive for immunocompromised state.  Neurological: Positive for weakness.  All other systems reviewed and are negative.    Physical Exam Updated Vital Signs BP 119/77 (BP Location: Left Arm)   Pulse 79   Temp 99.9 F (37.7 C) (Oral)   Resp (!) 24   Ht 5\' 3"  (1.6 m)   Wt 78.9 kg (174 lb)   LMP 01/30/2012   SpO2 96%   BMI 30.82 kg/m   Physical Exam  Constitutional: She is oriented to person, place, and time. She appears well-developed.  HENT:  Head: Normocephalic and atraumatic.  Mouth/Throat:  Oropharynx is clear and moist. No oropharyngeal exudate.  Eyes: Pupils are equal, round, and reactive to light. EOM are normal.  Neck: Normal range of motion. Neck supple.  No meningismus  Cardiovascular: Normal rate.  No murmur heard. Pulmonary/Chest: Effort normal. She has no wheezes. She has no rales.  Generalized rhonchus breath sounds  Abdominal: Bowel sounds are normal. There is no tenderness.  Musculoskeletal: She exhibits no edema.  Lymphadenopathy:    She has no cervical adenopathy.  Neurological: She is alert and oriented to person, place, and time.  Skin: Skin is warm and dry.  Nursing note and vitals reviewed.    ED Treatments / Results  Labs (all labs ordered are listed, but only abnormal results are displayed) Labs Reviewed  COMPREHENSIVE METABOLIC PANEL - Abnormal; Notable for the following components:      Result Value   Glucose, Bld 130 (*)    Creatinine, Ser 1.38 (*)    Calcium 8.8 (*)    AST 12 (*)    ALT 11 (*)    GFR calc non Af Amer 46 (*)    GFR calc Af Amer 53 (*)    All other components within normal limits  CBC WITH DIFFERENTIAL/PLATELET - Abnormal; Notable for the following components:   Hemoglobin 10.4 (*)    HCT 32.9 (*)    All other components within normal limits  URINALYSIS, ROUTINE W REFLEX MICROSCOPIC - Abnormal; Notable for the following components:   Color, Urine STRAW (*)    APPearance CLOUDY (*)    Hgb urine dipstick SMALL (*)  Bacteria, UA RARE (*)    All other components within normal limits  CULTURE, BLOOD (ROUTINE X 2)  CULTURE, BLOOD (ROUTINE X 2)  GROUP A STREP BY PCR  URINE CULTURE  RESPIRATORY PANEL BY PCR  I-STAT CG4 LACTIC ACID, ED  I-STAT CG4 LACTIC ACID, ED    EKG EKG Interpretation  Date/Time:  Thursday May 08 2018 01:32:04 EDT Ventricular Rate:  90 PR Interval:    QRS Duration: 80 QT Interval:  374 QTC Calculation: 458 R Axis:   50 Text Interpretation:  Sinus rhythm Borderline low voltage, extremity  leads No acute changes Nonspecific ST and T wave abnormality Confirmed by Varney Biles [phone]) on 05/08/2018 2:45:42 AM   Radiology Dg Chest 2 View  Result Date: 05/08/2018 CLINICAL DATA:  42 y/o F; cough, myalgia, fever, sore throat, runny nose. EXAM: CHEST - 2 VIEW COMPARISON:  07/21/2017 chest radiograph FINDINGS: Stable enlarged cardiac silhouette given projection and technique. Surgical clips project over the right lower neck and right upper quadrant. Aortic atherosclerosis with calcification. Mild bronchitic changes in the right lung base and left mid lung zone. No focal consolidation. No pleural effusion or pneumothorax. No acute osseous abnormality is evident. IMPRESSION: Bronchitic changes in the right lung base and left mid lung zone. No focal consolidation. Electronically Signed   By: Kristine Garbe M.D.   On: 05/08/2018 02:21    Procedures Procedures (including critical care time)  Medications Ordered in ED Medications  lactated ringers bolus 1,000 mL (0 mLs Intravenous Stopped 05/08/18 0405)  morphine 4 MG/ML injection 4 mg (4 mg Intravenous Given 05/08/18 0132)  morphine 4 MG/ML injection 4 mg (4 mg Intravenous Given 05/08/18 0320)  lactated ringers bolus 1,000 mL (0 mLs Intravenous Stopped 05/08/18 0555)  acetaminophen (TYLENOL) tablet 650 mg (650 mg Oral Given 05/08/18 0552)     Initial Impression / Assessment and Plan / ED Course  I have reviewed the triage vital signs and the nursing notes.  Pertinent labs & imaging results that were available during my care of the patient were reviewed by me and considered in my medical decision making (see chart for details).  Clinical Course as of May 09 715  Thu May 08, 2018  0308 All of the labs appear reassuring.  UA is pending along with respiratory viral panel. Patient states that her pain improved significantly after the morphine. Patient is noted to have a low blood pressure, but she states that this blood pressure  is normal for her. We have initiated oral challenge and so far patient has done well.   [AN]    Clinical Course User Index [AN] Varney Biles, MD    42 year old female with history of immunosuppression secondary to bilateral kidney transplant comes in with chief complaint of fevers, weakness and body aches. Patient abruptly got sick at 3 AM yesterday.  Since then her symptoms have progressed.   On my exam patient has no focal findings and history suggestive of URI-like symptoms only.  Given her immunosuppression we considered meningitis, pharyngitis, viral URI, pneumonia, UTI, cellulitis in the differential diagnosis.  Suspicion for viral infection is highest.  Basic labs have been ordered including blood cultures.  We will hydrate the patient and reassess..  Final Clinical Impressions(s) / ED Diagnoses   Final diagnoses:  Viral illness  Viral upper respiratory tract infection    ED Discharge Orders        Ordered    acetaminophen (TYLENOL 8 HOUR) 650 MG CR tablet  Every 8  hours     05/08/18 [place], Kester Stimpson, MD 05/08/18 6283    Varney Biles, MD 05/08/18 [phone]

## 2018-05-08 NOTE — Discharge Instructions (Signed)
We saw you in the ER for the pain and fevers. All the results in the ER are reassuring. Chest Xray did show bronchitis - which should resolve on it's own. We think you are having a viral illness, which is usually self limiting.  The workup in the ER is not complete, and is limited to screening for life threatening and emergent conditions only, so please see a primary care doctor for further evaluation.  Please return to the ER if your symptoms worsen; you have increased pain, fevers, chills, inability to keep any medications down, confusion. Otherwise see the outpatient doctor as requested.

## 2018-05-08 NOTE — ED Notes (Signed)
Pt states she does not need to urinate at this time and it will be hard for her to give a urine sample bc she "does not go in public", advised need sample to avoid in and out cath if possible

## 2018-05-08 NOTE — ED Triage Notes (Signed)
Pt c/o fever, generalized body aches, cough, sore throat, was seen at pcp office yesterday, had strep test performed but it was negative

## 2018-05-08 NOTE — ED Notes (Signed)
Patient was asleep upon me entering room. Patient awoke upon her name being called. Explained to patient that BP needed to be updated. Patient states that it has to be done on her leg. Patient went back to sleep and is snoring at this time.

## 2018-05-09 LAB — URINE CULTURE

## 2018-05-13 LAB — CULTURE, BLOOD (ROUTINE X 2)
Culture: NO GROWTH
Culture: NO GROWTH
Special Requests: ADEQUATE
Special Requests: ADEQUATE

## 2018-07-04 ENCOUNTER — Encounter (HOSPITAL_COMMUNITY): Payer: Self-pay

## 2018-07-04 ENCOUNTER — Emergency Department (HOSPITAL_COMMUNITY): Payer: BLUE CROSS/BLUE SHIELD

## 2018-07-04 ENCOUNTER — Emergency Department (HOSPITAL_COMMUNITY)
Admission: EM | Admit: 2018-07-04 | Discharge: 2018-07-04 | Disposition: A | Discharge status: Home / Self Care | Payer: BLUE CROSS/BLUE SHIELD | Attending: Emergency Medicine | Admitting: Emergency Medicine

## 2018-07-04 ENCOUNTER — Other Ambulatory Visit: Payer: Self-pay

## 2018-07-04 DIAGNOSIS — Z8541 Personal history of malignant neoplasm of cervix uteri: Secondary | ICD-10-CM | POA: Diagnosis not present

## 2018-07-04 DIAGNOSIS — Z7984 Long term (current) use of oral hypoglycemic drugs: Secondary | ICD-10-CM | POA: Diagnosis not present

## 2018-07-04 DIAGNOSIS — R748 Abnormal levels of other serum enzymes: Secondary | ICD-10-CM | POA: Diagnosis not present

## 2018-07-04 DIAGNOSIS — R778 Other specified abnormalities of plasma proteins: Secondary | ICD-10-CM

## 2018-07-04 DIAGNOSIS — N186 End stage renal disease: Secondary | ICD-10-CM | POA: Insufficient documentation

## 2018-07-04 DIAGNOSIS — I120 Hypertensive chronic kidney disease with stage 5 chronic kidney disease or end stage renal disease: Secondary | ICD-10-CM | POA: Diagnosis not present

## 2018-07-04 DIAGNOSIS — Z940 Kidney transplant status: Secondary | ICD-10-CM | POA: Diagnosis not present

## 2018-07-04 DIAGNOSIS — Z853 Personal history of malignant neoplasm of breast: Secondary | ICD-10-CM | POA: Insufficient documentation

## 2018-07-04 DIAGNOSIS — J690 Pneumonitis due to inhalation of food and vomit: Secondary | ICD-10-CM | POA: Diagnosis not present

## 2018-07-04 DIAGNOSIS — E039 Hypothyroidism, unspecified: Secondary | ICD-10-CM | POA: Insufficient documentation

## 2018-07-04 DIAGNOSIS — R079 Chest pain, unspecified: Secondary | ICD-10-CM | POA: Insufficient documentation

## 2018-07-04 DIAGNOSIS — R110 Nausea: Secondary | ICD-10-CM | POA: Diagnosis not present

## 2018-07-04 DIAGNOSIS — E1122 Type 2 diabetes mellitus with diabetic chronic kidney disease: Secondary | ICD-10-CM | POA: Diagnosis not present

## 2018-07-04 DIAGNOSIS — R109 Unspecified abdominal pain: Secondary | ICD-10-CM | POA: Diagnosis present

## 2018-07-04 DIAGNOSIS — R7989 Other specified abnormal findings of blood chemistry: Secondary | ICD-10-CM

## 2018-07-04 LAB — COMPREHENSIVE METABOLIC PANEL
ALT: 10 U/L (ref 0–44)
AST: 16 U/L (ref 15–41)
Albumin: 3.7 g/dL (ref 3.5–5.0)
Alkaline Phosphatase: 53 U/L (ref 38–126)
Anion gap: 10 (ref 5–15)
BUN: 31 mg/dL — ABNORMAL HIGH (ref 6–20)
CO2: 19 mmol/L — ABNORMAL LOW (ref 22–32)
Calcium: 8.4 mg/dL — ABNORMAL LOW (ref 8.9–10.3)
Chloride: 105 mmol/L (ref 98–111)
Creatinine, Ser: 1.56 mg/dL — ABNORMAL HIGH (ref 0.44–1.00)
GFR calc Af Amer: 46 mL/min — ABNORMAL LOW (ref >60)
GFR calc non Af Amer: 39 mL/min — ABNORMAL LOW (ref >60)
Glucose, Bld: 159 mg/dL — ABNORMAL HIGH (ref 70–99)
Potassium: 4 mmol/L (ref 3.5–5.1)
Sodium: 134 mmol/L — ABNORMAL LOW (ref 135–145)
Total Bilirubin: 0.8 mg/dL (ref 0.3–1.2)
Total Protein: 6.6 g/dL (ref 6.5–8.1)

## 2018-07-04 LAB — CBC WITH DIFFERENTIAL/PLATELET
Basophils Absolute: 0 10*3/uL (ref 0.0–0.1)
Basophils Relative: 0 %
Eosinophils Absolute: 0.2 10*3/uL (ref 0.0–0.7)
Eosinophils Relative: 2 %
HCT: 32.8 % — ABNORMAL LOW (ref 36.0–46.0)
Hemoglobin: 10.5 g/dL — ABNORMAL LOW (ref 12.0–15.0)
Lymphocytes Relative: 8 %
Lymphs Abs: 0.8 10*3/uL (ref 0.7–4.0)
MCH: 26.3 pg (ref 26.0–34.0)
MCHC: 32 g/dL (ref 30.0–36.0)
MCV: 82 fL (ref 78.0–100.0)
Monocytes Absolute: 0.5 10*3/uL (ref 0.1–1.0)
Monocytes Relative: 5 %
Neutro Abs: 9 10*3/uL (ref 1.7–7.7)
Neutrophils Relative %: 85 %
Platelets: ADEQUATE 10*3/uL (ref 150–400)
RBC: 4 MIL/uL (ref 3.87–5.11)
RDW: 13.3 % (ref 11.5–15.5)
WBC: 10.5 10*3/uL (ref 4.0–10.5)

## 2018-07-04 LAB — CK: Total CK: 68 U/L (ref 38–234)

## 2018-07-04 LAB — TROPONIN I: Troponin I: 0.04 ng/mL (ref <0.03)

## 2018-07-04 LAB — LACTIC ACID, PLASMA: Lactic Acid, Venous: 1 mmol/L (ref 0.5–1.9)

## 2018-07-04 LAB — LIPASE, BLOOD: Lipase: 29 U/L (ref 11–51)

## 2018-07-04 MED ORDER — MORPHINE SULFATE (PF) 4 MG/ML IV SOLN: 8.0000 mg | Freq: Once | INTRAVENOUS | Status: DC

## 2018-07-04 MED ORDER — SODIUM CHLORIDE 0.9 % IV SOLN: 3.0000 g | INTRAVENOUS | Status: DC

## 2018-07-04 MED ORDER — AMOXICILLIN-POT CLAVULANATE 875-125 MG PO TABS: 1.0000 | ORAL_TABLET | Freq: Two times a day (BID) | ORAL | 0 refills | Status: DC

## 2018-07-04 MED ORDER — SODIUM CHLORIDE 0.9 % IV BOLUS
1000.0000 mL | Freq: Once | INTRAVENOUS | Status: AC
  Administered 2018-07-04: 1000 mL via INTRAVENOUS

## 2018-07-04 MED ORDER — MORPHINE SULFATE (PF) 4 MG/ML IV SOLN
8.0000 mg | Freq: Once | INTRAVENOUS | Status: AC
  Administered 2018-07-04: 8 mg via INTRAVENOUS
  Filled 2018-07-04: qty 2

## 2018-07-04 MED ORDER — FENTANYL CITRATE (PF) 100 MCG/2ML IJ SOLN
100.0000 ug | Freq: Once | INTRAMUSCULAR | Status: AC
Start: 2018-07-04 — End: 2018-07-04
  Administered 2018-07-04: 100 ug via INTRAMUSCULAR
  Filled 2018-07-04: qty 2

## 2018-07-04 MED ORDER — SODIUM CHLORIDE 0.9 % IV SOLN
3.0000 g | Freq: Two times a day (BID) | INTRAVENOUS | Status: DC
  Administered 2018-07-04: 3 g via INTRAVENOUS
  Filled 2018-07-04 (×2): qty 3

## 2018-07-04 MED ORDER — ONDANSETRON HCL 4 MG/2ML IJ SOLN
4.0000 mg | Freq: Once | INTRAMUSCULAR | Status: AC
  Administered 2018-07-04: 4 mg via INTRAVENOUS
  Filled 2018-07-04: qty 2

## 2018-07-04 NOTE — Progress Notes (Signed)
Pharmacy Antibiotic Note  [name] is a 42 y.o. female admitted on 07/04/2018 with aspiration pneumonia.  Pharmacy has been consulted for Unasyn dosing.  Plan: Start Unasyn 3g IV q12h  Pharmacy will monitor patient's progress, cultures and pertinent labs.  Height: 5\' 2"  (157.5 cm) Weight: 167 lb (75.8 kg) IBW/kg (Calculated) : 50.1  Temp (24hrs), Avg:99.9 F (37.7 C), Min:99.9 F (37.7 C), Max:99.9 F (37.7 C)  Recent Labs  Lab 07/04/18 1826  WBC 10.5  CREATININE 1.56*    Estimated Creatinine Clearance: 43.9 mL/min (A) (by C-G formula based on SCr of 1.56 mg/dL (H)).    Allergies  Allergen Reactions  . Barium-Containing Compounds Anaphylaxis  . Dilaudid [Hydromorphone Hcl] Anaphylaxis    Pt states it makes anxious and itchy   . Ivp Dye [Iodinated Diagnostic Agents] Anaphylaxis  . Shellfish Allergy Shortness Of Breath, Itching and Rash  . Vancomycin Other (See Comments)    Redman syndrome.   . Aspirin Other (See Comments)    Due to multiple gastric issues  . Adhesive [Tape] Dermatitis  . Infed [Iron Dextran] Itching  . Sulfa Antibiotics Hives  . Iopamidol Itching and Rash    Antimicrobials this admission: 7/12 Unasyn >>   Dose adjustments this admission: n/a  Microbiology results: n/a  Thank you for allowing pharmacy to be a part of this patient's care.  Despina Pole 07/04/2018 8:40 PM

## 2018-07-04 NOTE — ED Notes (Signed)
Pt DC instructions given Pt reports, "I can read it"  Dr Darnell Level spoke w pt regarding admission- pt declined Pt read first page of DC instruction re: antibiotics, return if needed for CP and worse, as well as follow up with Frederico Hamman, PA in 3 days

## 2018-07-04 NOTE — ED Provider Notes (Signed)
[facility] Provider Note   CSN: [id number] Arrival date & time: 07/04/18  1717     History   Chief Complaint Chief Complaint  Patient presents with  . Abdominal Pain    HPI [name] is a 42 y.o. female.  HPI  42 year old female presents with abdominal and chest pain.  She had an endoscopy this afternoon at 1 PM.  She states that on the ride home she started developing pain everywhere.  She is been having nausea and dizziness.  She states the worst of the pain is lower chest/upper abdomen but she is also having facial pain, diffuse arthralgias, and just overall pain.  The pain is severe.  She has a hard time sitting still and is not very cooperative with the exam or the history.  Does not seem like she is been throwing up.  She was getting the endoscopy for hematemesis and on review of her EGD she did not appear to have any significant abnormalities.  Upon first examining the patient, she asks for no IV to be placed right away and for her to be given multiple blankets so that she can warm up prior to any treatments.  Past Medical History:  Diagnosis Date  . Anxiety    OCD  . Asthma    as a child  . Cancer [facility]) 2001   renal cell , breast cancer both breast 2011, cervical cancer 2013  . Chronic anemia   . Chronic pain   . Complication of anesthesia    hard to sedate dr hung aware  . Crohn's disease ([place])   . Diabetes mellitus without complication ([place])   . Fracture 05/01/2013   Right foot, in cast  . History of blood transfusion 2 years ago  . Hyperlipidemia   . Hypothyroidism    parathroid runs low  . Meningioma ([place])   . Panic disorder   . PONV (postoperative nausea and vomiting)   . Renal disorder    S/p '11-Kidney transplant-Dr. Florene Glen follows  . Urticaria     Patient Active Problem List   Diagnosis Date Noted  . UTI (lower urinary tract infection) 11/20/2013  . CKD (chronic kidney disease) stage 3, GFR 30-59 ml/min (HCC) 08/25/2013    . Perirectal abscess 08/24/2013  . ESRD (end stage renal disease) ([place]) 03/27/2011  . Kidney transplant status, living unrelated donor 03/27/2011  . Anemia 03/27/2011  . Type 2 diabetes mellitus ([place]) 03/27/2011  . Osteoarthritis of right knee 03/27/2011  . Lumbago 03/27/2011  . HTN (hypertension) 03/27/2011  . HLD (hyperlipidemia) 03/27/2011  . Generalized anxiety disorder 03/27/2011  . Depression 03/27/2011  . Chronic steroid use 03/27/2011  . Crohn's disease ([place]) 03/27/2011  . OCD (obsessive compulsive disorder) 03/27/2011  . 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 Obstructive sleep apnea, adult 03/27/2011    Past Surgical History:  Procedure Laterality Date  . ABDOMINAL HYSTERECTOMY  02/13/2012   Procedure: HYSTERECTOMY ABDOMINAL;  Surgeon: Cyril Mourning, MD;  Location: [facility];  Service: Gynecology;  Laterality: N/A;  . BRAVO [place] STUDY N/A 11/27/2013   Procedure: BRAVO [place];  Surgeon: Beryle Beams, MD;  Location: [facility];  Service: Endoscopy;  Laterality: N/A;  . BRAVO [place] STUDY N/A 12/11/2013   Procedure: BRAVO [place];  Surgeon: Beryle Beams, MD;  Location: [facility];  Service: Endoscopy;  Laterality: N/A;  . breast lumpectomy and reconstruction Bilateral 2011  . BREAST SURGERY     bilateral lumpectomy 4'11  .  COLONOSCOPY N/A 09/25/2013   Procedure: COLONOSCOPY;  Surgeon: Beryle Beams, MD;  [facility];  Service: Endoscopy;  Laterality: N/A;  . COLONOSCOPY WITH PROPOFOL N/A 07/29/2015   Procedure: COLONOSCOPY WITH PROPOFOL;  Surgeon: Carol Ada, MD;  [facility];  Service: Endoscopy;  Laterality: N/A;  . CYSTOSCOPY  02/13/2012   Procedure: CYSTOSCOPY;  Surgeon: Franchot Gallo, MD;  Location: [facility];  Service: Urology;  Laterality: N/A;  insertion of ureteral catheter , removed per Dr Diona Fanti   . ESOPHAGEAL MANOMETRY N/A 08/10/2013   Procedure: ESOPHAGEAL MANOMETRY (EM);  Surgeon: Beryle Beams, MD;  [facility];   Service: Endoscopy;  Laterality: N/A;  . ESOPHAGOGASTRODUODENOSCOPY N/A 09/25/2013   Procedure: ESOPHAGOGASTRODUODENOSCOPY (EGD);  Surgeon: Beryle Beams, MD;  Location: [facility];  Service: Endoscopy;  Laterality: N/A;  . ESOPHAGOGASTRODUODENOSCOPY N/A 11/27/2013   Procedure: ESOPHAGOGASTRODUODENOSCOPY (EGD);  Surgeon: Beryle Beams, MD;  Location: [facility];  Service: Endoscopy;  Laterality: N/A;  . ESOPHAGOGASTRODUODENOSCOPY N/A 12/11/2013   Procedure: ESOPHAGOGASTRODUODENOSCOPY (EGD);  Surgeon: Beryle Beams, MD;  Location: [facility];  Service: Endoscopy;  Laterality: N/A;  . GROIN DISSECTION Left 10/09/2013   Procedure: INCISION AND DRAINAGE OF LEFT GROIN ABSCESS;  Surgeon: Shann Medal, MD;  Location: [facility];  Service: General;  Laterality: Left;  . INCISION AND DRAINAGE ABSCESS N/A 08/26/2013   Procedure: INCISION AND DRAINAGE PERIRECTAL ABSCESS;  Surgeon: Jamesetta So, MD;  Location: [facility];  Service: General;  Laterality: N/A;  . KNEE SURGERY Left april 2003   arthroscopy  . LOWER EXTREMITY ARTERIAL DOPLLERS  05/06/2012   Bilat ABIs were attempted-not accessible due to calcified veins. Bilat TBIs demonstrated no obtainable flow through both great toes. Bilat PVR ankle regions demonstrated moderately abnormal pulsatile flow. Lft SFA demonstrated a 50-69% diameter reduction. Lft runoff demonstrated occlusive diseasewith reconstitution of flow noted distally.  Marland Kitchen [place] and 2011   last '11-[facility]  . PARATHYROIDECTOMY  02-20-2001  . SLEEVE GASTROPLASTY     for GERD, hiatal hernia  . TUBAL LIGATION  02-19-2000     OB History    Gravida  1   Para      Term      Preterm      AB  1   Living  0     SAB  1   TAB      Ectopic      Multiple      Live Births               Home Medications    Prior to Admission medications   Medication Sig Start Date End Date Taking? Authorizing Provider  acyclovir ointment (ZOVIRAX) 5 % Apply  1 application topically 3 (three) times daily as needed (outbreak).    Yes [provider]  albuterol (PROAIR HFA) 108 (90 Base) MCG/ACT inhaler Inhale 2 puffs into the lungs every 4 (four) hours as needed for wheezing or shortness of breath. 04/11/17  Yes Valentina Shaggy, MD  ALPRAZolam Duanne Moron) 0.5 MG tablet Take 0.5 mg by mouth 2 (two) times daily.    Yes [provider]  calcitRIOL (ROCALTROL) 0.25 MCG capsule Take 4 capsules by mouth 2 (two) times daily. 04/04/12  Yes [provider]  calcium carbonate (TUMS) 500 MG chewable tablet Chew 1-2 tablets by mouth daily as needed for heartburn.  04/04/12  Yes [provider]  cyclobenzaprine (FLEXERIL) 10 MG tablet Take 10 mg by mouth 2 (  two) times daily as needed for muscle spasms.  05/28/17  Yes [provider]  dicyclomine (BENTYL) 10 MG capsule Take 20 mg by mouth 3 (three) times daily as needed for spasms.    Yes [provider]  doxepin (SINEQUAN) 10 MG capsule Take 10 mg by mouth at bedtime. 04/03/18 04/03/19 Yes [provider]  EPINEPHrine 0.3 mg/0.3 mL IJ SOAJ injection Use as directed, if administer call 911 02/08/17  Yes [provider]  FLUoxetine (PROZAC) 20 MG capsule Take 60 mg by mouth daily.    Yes [provider]  hydrocortisone 2.5 % ointment Apply 1 application topically every 6 (six) hours as needed (for irritation).    Yes [provider]  hydrOXYzine (ATARAX/VISTARIL) 50 MG tablet Take 50 mg by mouth at bedtime.    Yes [provider]  levocetirizine (XYZAL) 5 MG tablet Take 5 mg by mouth every evening.   Yes [provider]  loratadine (CLARITIN) 10 MG tablet Take 10 mg by mouth every morning.    Yes [provider]  magic mouthwash SOLN Take 5 mLs by mouth every 4 (four) hours as needed for mouth pain.  04/12/16  Yes [provider]  metFORMIN (GLUCOPHAGE-XR) 500 MG 24 hr tablet Take 1,000 mg by mouth 2  (two) times daily.   Yes [provider]  metoCLOPramide (REGLAN) 10 MG tablet Take 10 mg by mouth 3 (three) times daily as needed for nausea.   Yes [provider]  mycophenolate (MYFORTIC) 180 MG EC tablet Take 540 mg by mouth 2 (two) times daily.    Yes Niel Hummer, NP  omeprazole (PRILOSEC) 40 MG capsule Take 40 mg by mouth 2 (two) times daily.    Yes [provider]  ondansetron (ZOFRAN) 8 MG tablet Take by mouth every 8 (eight) hours as needed for nausea or vomiting.   Yes [provider]  Oxycodone HCl 10 MG TABS Take 10 mg by mouth 3 (three) times daily as needed for severe pain.    Yes [provider]  predniSONE (DELTASONE) 10 MG tablet Take 10 mg by mouth daily with breakfast.    Yes [provider]  promethazine (PHENERGAN) 25 MG tablet Take 1 tablet (25 mg total) by mouth every 6 (six) hours as needed for nausea. 06/11/17  Yes Davonna Belling, MD  rosuvastatin (CRESTOR) 5 MG tablet Take 5 mg by mouth at bedtime.   Yes [provider]  sucralfate (CARAFATE) 1 g tablet Take 1 g by mouth 4 (four) times daily -  with meals and at bedtime.   Yes [provider]  tacrolimus (PROGRAF) 1 MG capsule Take 3 mg by mouth 2 (two) times daily.   Yes [provider]  valACYclovir (VALTREX) 500 MG tablet Take 500-1,000 mg by mouth See admin instructions. 2 on the first day then 1 tab day 2-5   Yes [provider]  acetaminophen (TYLENOL 8 HOUR) 650 MG CR tablet Take 1 tablet (650 mg total) by mouth every 8 (eight) hours. Patient not taking: Reported on 07/04/2018 05/08/18   Varney Biles, MD  amoxicillin-clavulanate (AUGMENTIN) 875-125 MG tablet Take 1 tablet by mouth 2 (two) times daily. One po bid x 7 days 07/04/18   Sherwood Gambler, MD    Family History Family History  Problem Relation Age of Onset  . Diabetes Mother   . Hypertension Mother   . Asthma Mother   . Diabetes Father   . Hypertension Father    .  Cancer Father        Prostate cancer  . Asthma Father   . Obesity Sister   . Eczema Sister   . Stroke Maternal Grandmother   . Heart attack Maternal Grandfather   . Polycystic ovary syndrome Sister     Social History Social History   Tobacco Use  . Smoking status: Never Smoker  . Smokeless tobacco: Never Used  Substance Use Topics  . Alcohol use: No  . Drug use: No     Allergies   Barium-containing compounds; Dilaudid [hydromorphone hcl]; Ivp dye [iodinated diagnostic agents]; Shellfish allergy; Vancomycin; Aspirin; Adhesive [tape]; Infed [iron dextran]; Sulfa antibiotics; and Iopamidol   Review of Systems Review of Systems  Cardiovascular: Positive for chest pain.  Gastrointestinal: Positive for abdominal pain and nausea. Negative for vomiting.  Musculoskeletal: Positive for arthralgias.  Neurological: Positive for dizziness.  All other systems reviewed and are negative.    Physical Exam Updated Vital Signs BP (!) 96/57   Pulse 96   Temp 99.9 F (37.7 C) (Oral)   Resp 20   Ht 5\' 2"  (1.575 m)   Wt 75.8 kg (167 lb)   LMP 01/30/2012   SpO2 97%   BMI 30.54 kg/m   Physical Exam  Constitutional: She is oriented to person, place, and time. She appears well-developed and well-nourished.  Non-toxic appearance. She does not appear ill. She appears distressed.  HENT:  Head: Normocephalic and atraumatic.  Right Ear: External ear normal.  Left Ear: External ear normal.  Nose: Nose normal.  Eyes: Right eye exhibits no discharge. Left eye exhibits no discharge.  Cardiovascular: Normal rate, regular rhythm and normal heart sounds.  HR~100  Pulmonary/Chest: Effort normal and breath sounds normal.  Abdominal: Soft. There is generalized tenderness (worst in the upper abdomen).  Neurological: She is alert and oriented to person, place, and time.  Skin: Skin is warm and dry. She is not diaphoretic.  Nursing note and vitals reviewed.    ED Treatments / Results    Labs (all labs ordered are listed, but only abnormal results are displayed) Labs Reviewed  COMPREHENSIVE METABOLIC PANEL - Abnormal; Notable for the following components:      Result Value   Sodium 134 (*)    CO2 19 (*)    Glucose, Bld 159 (*)    BUN 31 (*)    Creatinine, Ser 1.56 (*)    Calcium 8.4 (*)    GFR calc non Af Amer 39 (*)    GFR calc Af Amer 46 (*)    All other components within normal limits  CBC WITH DIFFERENTIAL/PLATELET - Abnormal; Notable for the following components:   Hemoglobin 10.5 (*)    HCT 32.8 (*)    All other components within normal limits  TROPONIN I - Abnormal; Notable for the following components:   Troponin I 0.04 (*)    All other components within normal limits  CULTURE, BLOOD (ROUTINE X 2)  CULTURE, BLOOD (ROUTINE X 2)  LIPASE, BLOOD  CK  LACTIC ACID, PLASMA  I-STAT CHEM 8, ED    EKG EKG Interpretation  Date/Time:  Friday July 04 2018 18:09:18 EDT Ventricular Rate:  99 PR Interval:    QRS Duration: 90 QT Interval:  363 QTC Calculation: 466 R Axis:   78 Text Interpretation:  Sinus rhythm Low voltage, extremity leads nonspecific ST/T changes no significant change since May 2019 Confirmed by Sherwood Gambler [phone]) on 07/04/2018 7:27:14 PM   Radiology Ct Abdomen Pelvis Wo Contrast  Result Date: 07/04/2018 CLINICAL DATA:  Acute severe abdominal pain status post recent endoscopy. EXAM: CT CHEST, ABDOMEN AND PELVIS WITHOUT CONTRAST TECHNIQUE: Multidetector CT imaging of the chest, abdomen and pelvis was performed following the standard protocol without IV contrast. COMPARISON:  None. FINDINGS: CT CHEST FINDINGS Cardiovascular: Limited noncontrast evaluation of the aorta at demonstrates no acute abnormality. Atheromatous plaque noted within the aortic arch. No aneurysm. Surgical clips noted adjacent to the proximal right common carotid artery. Partially visualized great vessels within normal limits. Cardiomegaly with diffuse 3 vessel coronary  artery calcifications. Small pericardial effusion. Limited noncontrast evaluation of the pulmonary arterial tree grossly unremarkable. Mediastinum/Nodes: Subcentimeter dystrophic calcifications noted within the thyroid. Visualized thyroid otherwise unremarkable. No enlarged mediastinal or hilar lymph nodes appreciated on this noncontrast examination. No enlarged axillary lymph nodes. Esophagus within normal limits without acute abnormality. No new most mediastinum to suggest perforation status post recent endoscopy. Small hiatal hernia noted. Lungs/Pleura: Tracheobronchial tree intact and patent. Lungs are well inflated bilaterally. Dense consolidative opacity with associated bronchograms present within the left lower lobe, consistent with infiltrate. While this could be infectious in nature, sequelae of aspiration could also be considered. Additional multifocal ground-glass opacities present within the left upper lobe. Right lung is clear. No pulmonary edema or pleural effusion. No pneumothorax. No worrisome pulmonary nodule or mass. Musculoskeletal: External soft tissues within normal limits. No acute osseous abnormality. Underlying changes related to renal osteodystrophy noted. No discrete lytic or blastic osseous lesions. CT ABDOMEN PELVIS FINDINGS Hepatobiliary: Limited noncontrast evaluation of the liver within normal limits. Right lateral abdominal hernia containing fat and a portion of the right hepatic lobe noted without associated inflammatory changes (series 2, image 58). Gallbladder moderately distended without acute inflammation or other abnormality. No appreciable biliary dilatation. Pancreas: Pancreas within normal limits. Spleen: Spleen within normal limits. Adrenals/Urinary Tract: Left adrenal gland normal. Right adrenal gland and kidney are absent. Native left kidney markedly atrophic without nephrolithiasis or hydronephrosis. No hydroureter. Left lower quadrant renal transplant in place. No  nephrolithiasis or hydronephrosis about the renal transplant. Atrophic kidney within the right hemipelvis, likely a prior renal transplant. Partially distended bladder within normal limits. Stomach/Bowel: Postoperative changes from prior gastric sleeve resection with esophagogastroduodenoscopy. Small hiatal hernia. No appreciable complication status post recent endoscopy. No evidence for bowel obstruction. Appendix within normal limits. Moderate stool impacted within the rectal vault, suggesting constipation. Transverse colon diffusely decompressed. Associated mild wall thickening felt to be most likely related to incomplete distension. Possible mild and/or early acute colitis not entirely excluded. No other acute inflammatory changes about the bowels. Vascular/Lymphatic: Prominent atherosclerotic change throughout the intra-abdominal arterial vasculature. No aortic aneurysm. No adenopathy. Reproductive: Uterus is absent.  Ovaries within normal limits. Other: No free air or fluid. Small fat containing paraumbilical hernia noted. Musculoskeletal: No acute osseous abnormality. No worrisome lytic or blastic osseous lesions. Changes consistent with renal osteodystrophy. IMPRESSION: 1. Extensive patchy multifocal ground-glass and consolidative opacity involving the left upper and lower lobes, suspicious for either infectious or aspiration pneumonitis. 2. No other acute complication or other abnormality status post recent endoscopy. 3. No other acute abnormality within the chest, abdomen, and pelvis. 4. Moderate stool impacted within the rectal vault, suggesting constipation. Electronically Signed   By: Jeannine Boga M.D.   On: 07/04/2018 20:00   Ct Chest Wo Contrast  Result Date: 07/04/2018 CLINICAL DATA:  Acute severe abdominal pain status post recent endoscopy. EXAM: CT CHEST, ABDOMEN AND PELVIS WITHOUT CONTRAST TECHNIQUE: Multidetector CT imaging of the chest,  abdomen and pelvis was performed following the  standard protocol without IV contrast. COMPARISON:  None. FINDINGS: CT CHEST FINDINGS Cardiovascular: Limited noncontrast evaluation of the aorta at demonstrates no acute abnormality. Atheromatous plaque noted within the aortic arch. No aneurysm. Surgical clips noted adjacent to the proximal right common carotid artery. Partially visualized great vessels within normal limits. Cardiomegaly with diffuse 3 vessel coronary artery calcifications. Small pericardial effusion. Limited noncontrast evaluation of the pulmonary arterial tree grossly unremarkable. Mediastinum/Nodes: Subcentimeter dystrophic calcifications noted within the thyroid. Visualized thyroid otherwise unremarkable. No enlarged mediastinal or hilar lymph nodes appreciated on this noncontrast examination. No enlarged axillary lymph nodes. Esophagus within normal limits without acute abnormality. No new most mediastinum to suggest perforation status post recent endoscopy. Small hiatal hernia noted. Lungs/Pleura: Tracheobronchial tree intact and patent. Lungs are well inflated bilaterally. Dense consolidative opacity with associated bronchograms present within the left lower lobe, consistent with infiltrate. While this could be infectious in nature, sequelae of aspiration could also be considered. Additional multifocal ground-glass opacities present within the left upper lobe. Right lung is clear. No pulmonary edema or pleural effusion. No pneumothorax. No worrisome pulmonary nodule or mass. Musculoskeletal: External soft tissues within normal limits. No acute osseous abnormality. Underlying changes related to renal osteodystrophy noted. No discrete lytic or blastic osseous lesions. CT ABDOMEN PELVIS FINDINGS Hepatobiliary: Limited noncontrast evaluation of the liver within normal limits. Right lateral abdominal hernia containing fat and a portion of the right hepatic lobe noted without associated inflammatory changes (series 2, image 58). Gallbladder  moderately distended without acute inflammation or other abnormality. No appreciable biliary dilatation. Pancreas: Pancreas within normal limits. Spleen: Spleen within normal limits. Adrenals/Urinary Tract: Left adrenal gland normal. Right adrenal gland and kidney are absent. Native left kidney markedly atrophic without nephrolithiasis or hydronephrosis. No hydroureter. Left lower quadrant renal transplant in place. No nephrolithiasis or hydronephrosis about the renal transplant. Atrophic kidney within the right hemipelvis, likely a prior renal transplant. Partially distended bladder within normal limits. Stomach/Bowel: Postoperative changes from prior gastric sleeve resection with esophagogastroduodenoscopy. Small hiatal hernia. No appreciable complication status post recent endoscopy. No evidence for bowel obstruction. Appendix within normal limits. Moderate stool impacted within the rectal vault, suggesting constipation. Transverse colon diffusely decompressed. Associated mild wall thickening felt to be most likely related to incomplete distension. Possible mild and/or early acute colitis not entirely excluded. No other acute inflammatory changes about the bowels. Vascular/Lymphatic: Prominent atherosclerotic change throughout the intra-abdominal arterial vasculature. No aortic aneurysm. No adenopathy. Reproductive: Uterus is absent.  Ovaries within normal limits. Other: No free air or fluid. Small fat containing paraumbilical hernia noted. Musculoskeletal: No acute osseous abnormality. No worrisome lytic or blastic osseous lesions. Changes consistent with renal osteodystrophy. IMPRESSION: 1. Extensive patchy multifocal ground-glass and consolidative opacity involving the left upper and lower lobes, suspicious for either infectious or aspiration pneumonitis. 2. No other acute complication or other abnormality status post recent endoscopy. 3. No other acute abnormality within the chest, abdomen, and pelvis. 4.  Moderate stool impacted within the rectal vault, suggesting constipation. Electronically Signed   By: Jeannine Boga M.D.   On: 07/04/2018 20:00   Dg Chest Portable 1 View  Result Date: 07/04/2018 CLINICAL DATA:  Chest and abdominal pain after endoscopy today. Evaluate for free air. EXAM: PORTABLE CHEST 1 VIEW COMPARISON:  None. FINDINGS: No free air beneath the diaphragm is identified. Heart is top-normal in size. There is moderate aortic atherosclerosis and pulmonary vascular congestion. Probable small left effusion. Confluent opacities are seen  at the left lung base raise concern for pneumonia and atelectasis. No acute osseous abnormality. Surgical clips project over the superior mediastinum. IMPRESSION: Confluent airspace opacities at the left lung base raising concern for pneumonia and/or atelectasis. Probable small left effusion. Mild vascular congestion with borderline cardiomegaly. Electronically Signed   By: Ashley Royalty M.D.   On: 07/04/2018 18:17   Dg Abd Portable 1 View  Result Date: 07/04/2018 CLINICAL DATA:  Post endoscopy today.  Evaluate for free air. EXAM: PORTABLE ABDOMEN - 1 VIEW COMPARISON:  CT abdomen and pelvis 07/21/2017 FINDINGS: Unremarkable bowel gas pattern. No free air beneath the diaphragm. Borderline cardiomegaly with retrocardiac airspace opacity suspicious for pneumonia and/or atelectasis. Surgical clips project over the mid abdomen. No acute osseous abnormality. IMPRESSION: Retrocardiac airspace disease suspicious for atelectasis and/or pneumonia. No free air beneath the diaphragm is identified. Electronically Signed   By: Ashley Royalty M.D.   On: 07/04/2018 18:19    Procedures Procedures (including critical care time)  Medications Ordered in ED Medications  Ampicillin-Sulbactam (UNASYN) 3 g in sodium chloride 0.9 % 100 mL IVPB (3 g Intravenous New Bag/Given 07/04/18 2133)  morphine 4 MG/ML injection 8 mg (has no administration in time range)  sodium chloride 0.9  % bolus 1,000 mL (1,000 mLs Intravenous New Bag/Given 07/04/18 1804)  fentaNYL (SUBLIMAZE) injection 100 mcg (100 mcg Intramuscular Given 07/04/18 1802)  ondansetron (ZOFRAN) injection 4 mg (4 mg Intravenous Given 07/04/18 1811)  morphine 4 MG/ML injection 8 mg (8 mg Intravenous Given 07/04/18 1858)     Initial Impression / Assessment and Plan / ED Course  I have reviewed the triage vital signs and the nursing notes.  Pertinent labs & imaging results that were available during my care of the patient were reviewed by me and considered in my medical decision making (see chart for details).     Patient was evaluated for possible complications of EGD such as rupture.  Initially she is thrashing around in pain but after a dose of medicine she is much better.  She tells me that she is not truly allergic to Dilaudid and is not sure why it says anaphylaxis on her chart.  She is had morphine many times before.  She was given morphine and is substantially better.  CT scan does not show any evidence of complication of the EGD save for likely aspiration pneumonitis.  She states she is coughing and this is only started since she woke up from surgery.  She is afebrile.  She has some soft blood pressures but tells me her normal blood pressure is 80.  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  She declines, stating she usually gets worse in the hospital.  She understands my concern for her increased risk of sepsis and death from being immunocompromise with an infection.  She states she will come back if anything was to worsen.  She seems reasonable and capable of making medical decisions for herself.  I have given her a dose of Unasyn as this seems likely to be aspiration.  I will discharge her on Augmentin.  She is also noted to have a low level troponin of 0.04.  This is similar to when she was here a couple years ago.  I suspect this  is from chronic renal disease but I told her I cannot rule out MI.  She still does not want to stay.  We discussed strict return precautions.  Final Clinical Impressions(s) / ED Diagnoses   Final diagnoses:  Aspiration pneumonia of left lung, unspecified aspiration pneumonia type, unspecified part of lung ([place])  Elevated troponin    ED Discharge Orders        Ordered    amoxicillin-clavulanate (AUGMENTIN) 875-125 MG tablet  2 times daily     07/04/18 2120       Sherwood Gambler, MD 07/04/18 2206

## 2018-07-04 NOTE — ED Notes (Signed)
Dr Darnell Level given pt BP and reports is too low for morphine  Pt is informed and she states "I didn't want 8 of morphine, I just want 4"   Dr Darnell Level informed and reports it is too low for morphine Dr Darnell Level in to discuss w pt  He reports she understands that she will not get morphine, her antibiotics are finished and she is ready to be discharged

## 2018-07-04 NOTE — ED Triage Notes (Signed)
Pt reports had endoscopy at [facility] today and approx 1630 she started having severe abd pain.  Pt moaning, shivering.  Pt says her whole body hurts.  Denies vomiting or diarrhea.

## 2018-07-04 NOTE — ED Notes (Signed)
Call to CT pt 3rd in line  Pt and family informed of delay

## 2018-07-04 NOTE — ED Notes (Signed)
Ice chips with ginger ale given to pt

## 2018-07-04 NOTE — ED Notes (Signed)
Dr Darnell Level has spoken w sister of pt

## 2018-07-04 NOTE — ED Notes (Signed)
Date and time results received: 07/04/18 1925 (use smartphrase ".now" to insert current time)  Test: troponin Critical Value: 0.04  Name of Provider Notified: Dr. Regenia Skeeter notified at [place]? Or Actions Taken?: no/na

## 2018-07-04 NOTE — ED Notes (Signed)
To CT

## 2018-07-04 NOTE — ED Notes (Signed)
Sister to desk requesting pain meds, anxiety meds, and N meds for pt  Verbal reassurances that pt has had pain meds "it does not help" per sister Has N meds ordered, and EDP has been asked re anxiety meds Are not accepted by sister  Request to Dr Darnell Level to go speak with sister re care and plan of care

## 2018-07-04 NOTE — ED Notes (Signed)
Pt at [facility] today for upper endo On the way home Sipped ginger ale and had onset of excruciating pain "all over"  Pt w kidney transplant 20 years ago with fistula on bilateral arms   IV attempt x 2 unsuccessful

## 2018-07-04 NOTE — Discharge Instructions (Addendum)
We discussed that your CT scan shows pneumonia, likely from aspiration either due to vomiting or anesthesia.  You have been provided antibiotics.  Given your immunosuppression from your kidney transplant I have recommended to be admitted.  While you have declined, if you change your mind at any time you may return for further evaluation.  You need to take the antibiotics even if you feel better to completion.  Follow-up closely with your doctor in 3 days.  You were also noted to have an elevated troponin level which could indicate some heart damage.  We discussed this and if you develop further chest pain or want to return for further evaluation you are encouraged to do so.

## 2018-07-07 ENCOUNTER — Emergency Department (HOSPITAL_COMMUNITY): Payer: BLUE CROSS/BLUE SHIELD

## 2018-07-07 ENCOUNTER — Encounter (HOSPITAL_COMMUNITY): Payer: Self-pay

## 2018-07-07 ENCOUNTER — Other Ambulatory Visit: Payer: Self-pay

## 2018-07-07 ENCOUNTER — Emergency Department (HOSPITAL_COMMUNITY)
Admission: EM | Admit: 2018-07-07 | Discharge: 2018-07-07 | Disposition: A | Discharge status: Home / Self Care | Payer: BLUE CROSS/BLUE SHIELD | Attending: Emergency Medicine | Admitting: Emergency Medicine

## 2018-07-07 DIAGNOSIS — Z79899 Other long term (current) drug therapy: Secondary | ICD-10-CM | POA: Diagnosis not present

## 2018-07-07 DIAGNOSIS — E1122 Type 2 diabetes mellitus with diabetic chronic kidney disease: Secondary | ICD-10-CM | POA: Diagnosis not present

## 2018-07-07 DIAGNOSIS — Z7984 Long term (current) use of oral hypoglycemic drugs: Secondary | ICD-10-CM | POA: Diagnosis not present

## 2018-07-07 DIAGNOSIS — R509 Fever, unspecified: Secondary | ICD-10-CM | POA: Diagnosis present

## 2018-07-07 DIAGNOSIS — E039 Hypothyroidism, unspecified: Secondary | ICD-10-CM | POA: Diagnosis not present

## 2018-07-07 DIAGNOSIS — N183 Chronic kidney disease, stage 3 (moderate): Secondary | ICD-10-CM | POA: Diagnosis not present

## 2018-07-07 DIAGNOSIS — J690 Pneumonitis due to inhalation of food and vomit: Secondary | ICD-10-CM

## 2018-07-07 DIAGNOSIS — Z940 Kidney transplant status: Secondary | ICD-10-CM | POA: Diagnosis not present

## 2018-07-07 DIAGNOSIS — I129 Hypertensive chronic kidney disease with stage 1 through stage 4 chronic kidney disease, or unspecified chronic kidney disease: Secondary | ICD-10-CM | POA: Diagnosis not present

## 2018-07-07 LAB — CBC WITH DIFFERENTIAL/PLATELET
Basophils Absolute: 0 10*3/uL (ref 0.0–0.1)
Basophils Relative: 0 %
Eosinophils Absolute: 0 10*3/uL (ref 0.0–0.7)
Eosinophils Relative: 0 %
HCT: 32.7 % — ABNORMAL LOW (ref 36.0–46.0)
Hemoglobin: 10.5 g/dL — ABNORMAL LOW (ref 12.0–15.0)
Lymphocytes Relative: 11 %
Lymphs Abs: 1.3 10*3/uL (ref 0.7–4.0)
MCH: 26.7 pg (ref 26.0–34.0)
MCHC: 32.1 g/dL (ref 30.0–36.0)
MCV: 83.2 fL (ref 78.0–100.0)
Monocytes Absolute: 1 10*3/uL (ref 0.1–1.0)
Monocytes Relative: 9 %
Neutro Abs: 9 10*3/uL — ABNORMAL HIGH (ref 1.7–7.7)
Neutrophils Relative %: 80 %
Platelets: 273 10*3/uL (ref 150–400)
RBC: 3.93 MIL/uL (ref 3.87–5.11)
RDW: 13.6 % (ref 11.5–15.5)
WBC: 11.3 10*3/uL — ABNORMAL HIGH (ref 4.0–10.5)

## 2018-07-07 LAB — COMPREHENSIVE METABOLIC PANEL
ALT: 9 U/L (ref 0–44)
AST: 11 U/L — ABNORMAL LOW (ref 15–41)
Albumin: 3.5 g/dL (ref 3.5–5.0)
Alkaline Phosphatase: 60 U/L (ref 38–126)
Anion gap: 11 (ref 5–15)
BUN: 16 mg/dL (ref 6–20)
CO2: 25 mmol/L (ref 22–32)
Calcium: 9 mg/dL (ref 8.9–10.3)
Chloride: 109 mmol/L (ref 98–111)
Creatinine, Ser: 1.31 mg/dL — ABNORMAL HIGH (ref 0.44–1.00)
GFR calc Af Amer: 56 mL/min — ABNORMAL LOW (ref >60)
GFR calc non Af Amer: 49 mL/min — ABNORMAL LOW (ref >60)
Glucose, Bld: 88 mg/dL (ref 70–99)
Potassium: 4.4 mmol/L (ref 3.5–5.1)
Sodium: 145 mmol/L (ref 135–145)
Total Bilirubin: 0.6 mg/dL (ref 0.3–1.2)
Total Protein: 6.9 g/dL (ref 6.5–8.1)

## 2018-07-07 LAB — I-STAT CG4 LACTIC ACID, ED: Lactic Acid, Venous: 1.19 mmol/L (ref 0.5–1.9)

## 2018-07-07 MED ORDER — ONDANSETRON HCL 4 MG/2ML IJ SOLN
4.0000 mg | Freq: Once | INTRAMUSCULAR | Status: AC
  Administered 2018-07-07: 4 mg via INTRAVENOUS
  Filled 2018-07-07: qty 2

## 2018-07-07 MED ORDER — SODIUM CHLORIDE 0.9 % IV BOLUS
1000.0000 mL | Freq: Once | INTRAVENOUS | Status: AC
  Administered 2018-07-07: 1000 mL via INTRAVENOUS

## 2018-07-07 MED ORDER — DOXYCYCLINE HYCLATE 100 MG PO CAPS: 100.0000 mg | ORAL_CAPSULE | Freq: Two times a day (BID) | ORAL | 0 refills | Status: DC

## 2018-07-07 MED ORDER — FENTANYL CITRATE (PF) 100 MCG/2ML IJ SOLN
50.0000 ug | Freq: Once | INTRAMUSCULAR | Status: AC
  Administered 2018-07-07: 50 ug via INTRAVENOUS
  Filled 2018-07-07: qty 2

## 2018-07-07 MED ORDER — SODIUM CHLORIDE 0.9 % IV SOLN
2.0000 g | Freq: Once | INTRAVENOUS | Status: AC
  Administered 2018-07-07: 2 g via INTRAVENOUS
  Filled 2018-07-07: qty 2

## 2018-07-07 NOTE — ED Notes (Signed)
Spoke with Dr. Alvino Chapel, he does not want another I-stat lactic acid performed since the first one was in normal limits.

## 2018-07-07 NOTE — ED Notes (Signed)
Pt was informed that a urine sample was needed

## 2018-07-07 NOTE — ED Notes (Signed)
Monitored DJ on blood draw.

## 2018-07-07 NOTE — Progress Notes (Signed)
A consult was received from an ED physician for cefepime per pharmacy dosing.  The patient's profile has been reviewed for ht/wt/allergies/indication/available labs.    A one time order has been placed for cefepime 2gm IV x1.  Further antibiotics/pharmacy consults should be ordered by admitting physician if indicated.                       Thank you, Lynelle Doctor 07/07/2018  1:32 PM

## 2018-07-07 NOTE — ED Provider Notes (Signed)
[facility] DEPT Provider Note   CSN: [id number] Arrival date & time: 07/07/18  6644     History   Chief Complaint Chief Complaint  Patient presents with  . Pneumonia    HPI [name] is a 42 y.o. female.  HPI Patient presents with pneumonia.  Seen 3 days ago after an EGD diagnosed with bilateral aspiration pneumonitis.  Has a history of a kidney transplant and is on immunosuppression.  Patient would not be admitted 3 days ago.  States she could just take the antibiotics at home.  States since then she is continued to have fevers and chills.  Aches all over.  States she is very weak.  Went to follow-up with her primary care doctor today and was sent in for likely admission.  Has had a continued cough.  Minimal sputum production.  States that she aches all over.  Has had some nausea vomiting diarrhea but this predated the symptoms and was why she had the EGD.  Had Tylenol this morning but has not had her Augmentin. Past Medical History:  Diagnosis Date  . Anxiety    OCD  . Asthma    as a child  . Cancer [facility]) 2001   renal cell , breast cancer both breast 2011, cervical cancer 2013  . Chronic anemia   . Chronic pain   . Complication of anesthesia    hard to sedate dr hung aware  . Crohn's disease ([place])   . Diabetes mellitus without complication ([place])   . Fracture 05/01/2013   Right foot, in cast  . History of blood transfusion 2 years ago  . Hyperlipidemia   . Hypothyroidism    parathroid runs low  . Meningioma ([place])   . Panic disorder   . PONV (postoperative nausea and vomiting)   . Renal disorder    S/p '11-Kidney transplant-Dr. Florene Glen follows  . Urticaria     Patient Active Problem List   Diagnosis Date Noted  . UTI (lower urinary tract infection) 11/20/2013  . CKD (chronic kidney disease) stage 3, GFR 30-59 ml/min (HCC) 08/25/2013  . Perirectal abscess 08/24/2013  . ESRD (end stage renal disease) ([place]) 03/27/2011  . Kidney  transplant status, living unrelated donor 03/27/2011  . Anemia 03/27/2011  . Type 2 diabetes mellitus ([place]) 03/27/2011  . Osteoarthritis of right knee 03/27/2011  . Lumbago 03/27/2011  . HTN (hypertension) 03/27/2011  . HLD (hyperlipidemia) 03/27/2011  . Generalized anxiety disorder 03/27/2011  . Depression 03/27/2011  . Chronic steroid use 03/27/2011  . Crohn's disease ([place]) 03/27/2011  . OCD (obsessive compulsive disorder) 03/27/2011  . 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 Obstructive sleep apnea, adult 03/27/2011    Past Surgical History:  Procedure Laterality Date  . ABDOMINAL HYSTERECTOMY  02/13/2012   Procedure: HYSTERECTOMY ABDOMINAL;  Surgeon: Cyril Mourning, MD;  Location: [facility];  Service: Gynecology;  Laterality: N/A;  . BRAVO [place] STUDY N/A 11/27/2013   Procedure: BRAVO [place];  Surgeon: Beryle Beams, MD;  Location: [facility];  Service: Endoscopy;  Laterality: N/A;  . BRAVO [place] STUDY N/A 12/11/2013   Procedure: BRAVO [place];  Surgeon: Beryle Beams, MD;  Location: [facility];  Service: Endoscopy;  Laterality: N/A;  . breast lumpectomy and reconstruction Bilateral 2011  . BREAST SURGERY     bilateral lumpectomy 4'11  . COLONOSCOPY N/A 09/25/2013   Procedure: COLONOSCOPY;  Surgeon: Beryle Beams, MD;  Location: [facility];  Service: Endoscopy;  Laterality: N/A;  .  COLONOSCOPY WITH PROPOFOL N/A 07/29/2015   Procedure: COLONOSCOPY WITH PROPOFOL;  Surgeon: Carol Ada, MD;  Location: [facility];  Service: Endoscopy;  Laterality: N/A;  . CYSTOSCOPY  02/13/2012   Procedure: CYSTOSCOPY;  Surgeon: Franchot Gallo, MD;  Location: [facility];  Service: Urology;  Laterality: N/A;  insertion of ureteral catheter , removed per Dr Diona Fanti   . ESOPHAGEAL MANOMETRY N/A 08/10/2013   Procedure: ESOPHAGEAL MANOMETRY (EM);  Surgeon: Beryle Beams, MD;  Location: [facility];  Service: Endoscopy;  Laterality: N/A;  . ESOPHAGOGASTRODUODENOSCOPY N/A 09/25/2013   Procedure:  ESOPHAGOGASTRODUODENOSCOPY (EGD);  Surgeon: Beryle Beams, MD;  Location: [facility];  Service: Endoscopy;  Laterality: N/A;  . ESOPHAGOGASTRODUODENOSCOPY N/A 11/27/2013   Procedure: ESOPHAGOGASTRODUODENOSCOPY (EGD);  Surgeon: Beryle Beams, MD;  Location: [facility];  Service: Endoscopy;  Laterality: N/A;  . ESOPHAGOGASTRODUODENOSCOPY N/A 12/11/2013   Procedure: ESOPHAGOGASTRODUODENOSCOPY (EGD);  Surgeon: Beryle Beams, MD;  Location: [facility];  Service: Endoscopy;  Laterality: N/A;  . GROIN DISSECTION Left 10/09/2013   Procedure: INCISION AND DRAINAGE OF LEFT GROIN ABSCESS;  Surgeon: Shann Medal, MD;  Location: [facility];  Service: General;  Laterality: Left;  . INCISION AND DRAINAGE ABSCESS N/A 08/26/2013   Procedure: INCISION AND DRAINAGE PERIRECTAL ABSCESS;  Surgeon: Jamesetta So, MD;  Location: [facility];  Service: General;  Laterality: N/A;  . KNEE SURGERY Left april 2003   arthroscopy  . LOWER EXTREMITY ARTERIAL DOPLLERS  05/06/2012   Bilat ABIs were attempted-not accessible due to calcified veins. Bilat TBIs demonstrated no obtainable flow through both great toes. Bilat PVR ankle regions demonstrated moderately abnormal pulsatile flow. Lft SFA demonstrated a 50-69% diameter reduction. Lft runoff demonstrated occlusive diseasewith reconstitution of flow noted distally.  Marland [place] and 2011   last '11-[facility]  . PARATHYROIDECTOMY  02-20-2001  . SLEEVE GASTROPLASTY     for GERD, hiatal hernia  . TUBAL LIGATION  02-19-2000     OB History    Gravida  1   Para      Term      Preterm      AB  1   Living  0     SAB  1   TAB      Ectopic      Multiple      Live Births               Home Medications    Prior to Admission medications   Medication Sig Start Date End Date Taking? Authorizing Provider  acetaminophen (TYLENOL 8 HOUR) 650 MG CR tablet Take 1 tablet (650 mg total) by mouth every 8 (eight) hours. 05/08/18  Yes Varney Biles, MD  acyclovir ointment (ZOVIRAX) 5 % Apply 1 application topically 3 (three) times daily as needed (outbreak).    Yes [provider]  albuterol (PROAIR HFA) 108 (90 Base) MCG/ACT inhaler Inhale 2 puffs into the lungs every 4 (four) hours as needed for wheezing or shortness of breath. 04/11/17  Yes Valentina Shaggy, MD  ALPRAZolam Duanne Moron) 0.5 MG tablet Take 0.5 mg by mouth 2 (two) times daily.    Yes [provider]  amoxicillin-clavulanate (AUGMENTIN) 875-125 MG tablet Take 1 tablet by mouth 2 (two) times daily. One po bid x 7 days 07/04/18  Yes Sherwood Gambler, MD  calcitRIOL (ROCALTROL) 0.25 MCG capsule Take 4 capsules by mouth 2 (two) times daily. 04/04/12  Yes [provider]  calcium carbonate (TUMS) 500 MG chewable tablet  Chew 1-2 tablets by mouth daily as needed for heartburn.  04/04/12  Yes [provider]  cyclobenzaprine (FLEXERIL) 10 MG tablet Take 10 mg by mouth 2 (two) times daily as needed for muscle spasms.  05/28/17  Yes [provider]  dicyclomine (BENTYL) 10 MG capsule Take 20 mg by mouth 3 (three) times daily as needed for spasms.    Yes [provider]  doxepin (SINEQUAN) 10 MG capsule Take 10 mg by mouth at bedtime. 04/03/18 04/03/19 Yes [provider]  EPINEPHrine 0.3 mg/0.3 mL IJ SOAJ injection Use as directed, if administer call 911 02/08/17  Yes [provider]  FLUoxetine (PROZAC) 20 MG capsule Take 60 mg by mouth daily.    Yes [provider]  hydrocortisone 2.5 % ointment Apply 1 application topically every 6 (six) hours as needed (for irritation).    Yes [provider]  hydrOXYzine (ATARAX/VISTARIL) 50 MG tablet Take 50 mg by mouth at bedtime.    Yes [provider]  levocetirizine (XYZAL) 5 MG tablet Take 5 mg by mouth every evening.   Yes [provider]  loratadine (CLARITIN) 10 MG tablet Take 10 mg by mouth every morning.    Yes [provider]    magic mouthwash SOLN Take 5 mLs by mouth every 4 (four) hours as needed for mouth pain.  04/12/16  Yes [provider]  metFORMIN (GLUCOPHAGE-XR) 500 MG 24 hr tablet Take 1,000 mg by mouth 2 (two) times daily.   Yes [provider]  metoCLOPramide (REGLAN) 10 MG tablet Take 10 mg by mouth 3 (three) times daily as needed for nausea.   Yes [provider]  mycophenolate (MYFORTIC) 180 MG EC tablet Take 540 mg by mouth 2 (two) times daily.    Yes Niel Hummer, NP  omeprazole (PRILOSEC) 40 MG capsule Take 40 mg by mouth 2 (two) times daily.    Yes [provider]  ondansetron (ZOFRAN) 8 MG tablet Take by mouth every 8 (eight) hours as needed for nausea or vomiting.   Yes [provider]  Oxycodone HCl 10 MG TABS Take 10 mg by mouth 3 (three) times daily as needed for severe pain.    Yes [provider]  predniSONE (DELTASONE) 10 MG tablet Take 10 mg by mouth daily with breakfast.    Yes [provider]  promethazine (PHENERGAN) 25 MG tablet Take 1 tablet (25 mg total) by mouth every 6 (six) hours as needed for nausea. 06/11/17  Yes Davonna Belling, MD  rosuvastatin (CRESTOR) 5 MG tablet Take 5 mg by mouth at bedtime.   Yes [provider]  sucralfate (CARAFATE) 1 g tablet Take 1 g by mouth 4 (four) times daily -  with meals and at bedtime.   Yes [provider]  tacrolimus (PROGRAF) 1 MG capsule Take 3 mg by mouth 2 (two) times daily.   Yes [provider]  valACYclovir (VALTREX) 500 MG tablet Take 500-1,000 mg by mouth See admin instructions. 2 on the first day then 1 tab day 2-5   Yes [provider]    Family History Family History  Problem Relation Age of Onset  . Diabetes Mother   . Hypertension Mother   . Asthma Mother   . Diabetes Father   . Hypertension Father   . Cancer Father        Prostate cancer  . Asthma Father   . Obesity Sister   . Eczema Sister   .  Stroke Maternal  Grandmother   . Heart attack Maternal Grandfather   . Polycystic ovary syndrome Sister     Social History Social History   Tobacco Use  . Smoking status: Never Smoker  . Smokeless tobacco: Never Used  Substance Use Topics  . Alcohol use: No  . Drug use: No     Allergies   Barium-containing compounds; Dilaudid [hydromorphone hcl]; Ivp dye [iodinated diagnostic agents]; Shellfish allergy; Vancomycin; Aspirin; Adhesive [tape]; Infed [iron dextran]; Sulfa antibiotics; and Iopamidol   Review of Systems Review of Systems  Constitutional: Positive for appetite change, chills and fever.  HENT: Negative for congestion.   Respiratory: Positive for cough.   Cardiovascular: Positive for chest pain.  Gastrointestinal: Positive for abdominal pain, diarrhea, nausea and vomiting.  Genitourinary: Negative for flank pain.  Musculoskeletal: Positive for back pain and myalgias.  Skin: Negative for rash.  Neurological: Negative for weakness.  Hematological: Negative for adenopathy.  Psychiatric/Behavioral: Negative for confusion.     Physical Exam Updated Vital Signs BP 109/74 (BP Location: Left Arm)   Pulse 74   Temp (S) 99.6 F (37.6 C) (Oral) Comment: Pt stated she took 500mg  of tylenol around 0830 this morning.  Resp 15   Ht 5\' 2"  (1.575 m)   Wt 75.8 kg (167 lb)   LMP 01/30/2012   SpO2 98%   BMI 30.54 kg/m   Physical Exam  Constitutional: She appears well-developed.  HENT:  Head: Normocephalic.  Eyes: Pupils are equal, round, and reactive to light.  Neck: Neck supple.  Cardiovascular: Normal rate.  Pulmonary/Chest:  Mildly harsh breath sounds  Abdominal: There is no tenderness.  Musculoskeletal: She exhibits no edema.  Mild muscular tenderness.  Skin: Skin is warm. Capillary refill takes less than 2 seconds.     ED Treatments / Results  Labs (all labs ordered are listed, but only abnormal results are displayed) Labs Reviewed  CBC WITH DIFFERENTIAL/PLATELET -  Abnormal; Notable for the following components:      Result Value   WBC 11.3 (*)    Hemoglobin 10.5 (*)    HCT 32.7 (*)    Neutro Abs 9.0 (*)    All other components within normal limits  COMPREHENSIVE METABOLIC PANEL - Abnormal; Notable for the following components:   Creatinine, Ser 1.31 (*)    AST 11 (*)    GFR calc non Af Amer 49 (*)    GFR calc Af Amer 56 (*)    All other components within normal limits  CULTURE, BLOOD (ROUTINE X 2)  CULTURE, BLOOD (ROUTINE X 2)  URINALYSIS, ROUTINE W REFLEX MICROSCOPIC  I-STAT CG4 LACTIC ACID, ED  I-STAT CG4 LACTIC ACID, ED    EKG None  Radiology Dg Chest 2 View  Result Date: 07/07/2018 CLINICAL DATA:  Follow-up pneumonia involving the LEFT UPPER and LOWER lobes, diagnosed 3 days ago. EXAM: CHEST - 2 VIEW COMPARISON:  CT chest 07/04/2018. Chest x-rays 07/04/2018, 05/08/2018 and earlier. FINDINGS: Cardiac silhouette mildly enlarged, unchanged. Thoracic aorta mildly atherosclerotic, unchanged. Surgical clips in the RIGHT UPPER mediastinum from prior parathyroidectomy. Hilar and mediastinal contours otherwise unremarkable. Interval mixed response to treatment of the pneumonia in the LEFT lung, with improved aeration in the LEFT LOWER LOBE and slight worsening aeration in the INFERIOR LEFT UPPER LOBE. Patchy airspace opacities persist throughout the LEFT lung. RIGHT lung remains clear. No visible pleural effusions. Pulmonary vascularity normal. Visualized bony thorax intact. IMPRESSION: 1. Mixed response to treatment of the pneumonia involving the LEFT lung since the  examinations 3 days ago. There is improved aeration in the LEFT LOWER LOBE with slight worsening aeration in the INFERIOR LEFT UPPER LOBE. 2. No new abnormalities. 3. Stable mild cardiomegaly without evidence of pulmonary edema. 4.  Aortic Atherosclerosis (ICD10-170.0) Electronically Signed   By: Evangeline Dakin M.D.   On: 07/07/2018 11:58    Procedures Procedures (including critical  care time)  Medications Ordered in ED Medications  ceFEPIme (MAXIPIME) 2 g in sodium chloride 0.9 % 100 mL IVPB (2 g Intravenous New Bag/Given 07/07/18 1344)  sodium chloride 0.9 % bolus 1,000 mL (0 mLs Intravenous Stopped 07/07/18 1319)  ondansetron (ZOFRAN) injection 4 mg (4 mg Intravenous Given 07/07/18 1211)  fentaNYL (SUBLIMAZE) injection 50 mcg (50 mcg Intravenous Given 07/07/18 1211)     Initial Impression / Assessment and Plan / ED Course  I have reviewed the triage vital signs and the nursing notes.  Pertinent labs & imaging results that were available during my care of the patient were reviewed by me and considered in my medical decision making (see chart for details).     Patient with cough shortness of breath and recent x-ray showing possible aspiration pneumonia.  She is a renal transplant patient.  3 days ago inpatient treatment recommended but patient was not willing to stay.  Today followed up with her PCP.  Has had continued worsening chills.  Patient unfortunately has refused several different things in the ER.  She later did allow a chest x-ray which showed worsening of part of it and improvement of part of it.  Refused rectal temperature.  Had questions of fever at home.  Later did allow blood work to be drawn but did account for some delay in it.  Since she is on immunosuppression with worsening pneumonia will admit to hospitalist.  Final Clinical Impressions(s) / ED Diagnoses   Final diagnoses:  Aspiration pneumonia of left lung, unspecified aspiration pneumonia type, unspecified part of lung [facility])  Renal transplant recipient    ED Discharge Orders    None       Davonna Belling, MD 07/07/18 1409

## 2018-07-07 NOTE — Discharge Instructions (Addendum)
Return for worsening shortness of breath.  Follow-up with your PCP.  Start taking the doxycycline for a little more coverage of your pneumonia.

## 2018-07-07 NOTE — ED Triage Notes (Signed)
Pt comes from home. Pt is AOx4 and ambulatory. Pt was seen on July 12 for Pneumonia and was sent home with antibiotics. Pt followed up with PCP and PCP told patient to come to Emergency Department.

## 2018-07-07 NOTE — ED Notes (Signed)
2 attempts to draw blood work were unsuccessful

## 2018-07-07 NOTE — ED Notes (Signed)
Pt refused to have lactic acid drawn and refused rectal temp as well as chest x-ray. MD made aware and pt informed she could not be admitted without having cxr done. Pt then stated she wanted a different nurse. Charge nurse made aware.

## 2018-07-09 LAB — CULTURE, BLOOD (ROUTINE X 2)
Culture: NO GROWTH
Culture: NO GROWTH
Special Requests: ADEQUATE
Special Requests: ADEQUATE

## 2018-07-12 LAB — CULTURE, BLOOD (ROUTINE X 2)
Culture: NO GROWTH
Culture: NO GROWTH
Special Requests: ADEQUATE

## 2018-12-10 ENCOUNTER — Emergency Department (HOSPITAL_COMMUNITY): Payer: BLUE CROSS/BLUE SHIELD

## 2018-12-10 ENCOUNTER — Encounter (HOSPITAL_COMMUNITY): Payer: Self-pay | Admitting: *Deleted

## 2018-12-10 ENCOUNTER — Emergency Department (HOSPITAL_COMMUNITY)
Admission: EM | Admit: 2018-12-10 | Discharge: 2018-12-10 | Discharge status: Against Medical Advice | Payer: BLUE CROSS/BLUE SHIELD | Attending: Emergency Medicine | Admitting: Emergency Medicine

## 2018-12-10 ENCOUNTER — Other Ambulatory Visit: Payer: Self-pay

## 2018-12-10 DIAGNOSIS — N390 Urinary tract infection, site not specified: Secondary | ICD-10-CM | POA: Diagnosis not present

## 2018-12-10 DIAGNOSIS — Z7984 Long term (current) use of oral hypoglycemic drugs: Secondary | ICD-10-CM | POA: Diagnosis not present

## 2018-12-10 DIAGNOSIS — N183 Chronic kidney disease, stage 3 (moderate): Secondary | ICD-10-CM | POA: Diagnosis not present

## 2018-12-10 DIAGNOSIS — E039 Hypothyroidism, unspecified: Secondary | ICD-10-CM | POA: Diagnosis not present

## 2018-12-10 DIAGNOSIS — J45909 Unspecified asthma, uncomplicated: Secondary | ICD-10-CM | POA: Insufficient documentation

## 2018-12-10 DIAGNOSIS — R079 Chest pain, unspecified: Secondary | ICD-10-CM | POA: Diagnosis present

## 2018-12-10 DIAGNOSIS — I129 Hypertensive chronic kidney disease with stage 1 through stage 4 chronic kidney disease, or unspecified chronic kidney disease: Secondary | ICD-10-CM | POA: Diagnosis not present

## 2018-12-10 DIAGNOSIS — Z79899 Other long term (current) drug therapy: Secondary | ICD-10-CM | POA: Insufficient documentation

## 2018-12-10 DIAGNOSIS — E162 Hypoglycemia, unspecified: Secondary | ICD-10-CM

## 2018-12-10 DIAGNOSIS — E11649 Type 2 diabetes mellitus with hypoglycemia without coma: Secondary | ICD-10-CM | POA: Diagnosis not present

## 2018-12-10 LAB — COMPREHENSIVE METABOLIC PANEL
ALT: 23 U/L (ref 0–44)
AST: 18 U/L (ref 15–41)
Albumin: 4.2 g/dL (ref 3.5–5.0)
Alkaline Phosphatase: 68 U/L (ref 38–126)
Anion gap: 7 (ref 5–15)
BUN: 21 mg/dL — ABNORMAL HIGH (ref 6–20)
CO2: 22 mmol/L (ref 22–32)
Calcium: 7.5 mg/dL — ABNORMAL LOW (ref 8.9–10.3)
Chloride: 109 mmol/L (ref 98–111)
Creatinine, Ser: 1.28 mg/dL — ABNORMAL HIGH (ref 0.44–1.00)
GFR calc Af Amer: 59 mL/min — ABNORMAL LOW (ref >60)
GFR calc non Af Amer: 51 mL/min — ABNORMAL LOW (ref >60)
Glucose, Bld: 91 mg/dL (ref 70–99)
Potassium: 4.5 mmol/L (ref 3.5–5.1)
Sodium: 138 mmol/L (ref 135–145)
Total Bilirubin: 0.8 mg/dL (ref 0.3–1.2)
Total Protein: 7.1 g/dL (ref 6.5–8.1)

## 2018-12-10 LAB — URINALYSIS, ROUTINE W REFLEX MICROSCOPIC
Bilirubin Urine: NEGATIVE
Glucose, UA: NEGATIVE mg/dL
Ketones, ur: NEGATIVE mg/dL
Nitrite: NEGATIVE
Protein, ur: NEGATIVE mg/dL
Specific Gravity, Urine: 1.015 (ref 1.005–1.030)
WBC, UA: >50 WBC/hpf — ABNORMAL HIGH (ref 0–5)
pH: 5 (ref 5.0–8.0)

## 2018-12-10 LAB — CBC WITH DIFFERENTIAL/PLATELET
Abs Immature Granulocytes: 0.02 10*3/uL (ref 0.00–0.07)
Basophils Absolute: 0 10*3/uL (ref 0.0–0.1)
Basophils Relative: 0 %
Eosinophils Absolute: 0 10*3/uL (ref 0.0–0.5)
Eosinophils Relative: 0 %
HCT: 37.3 % (ref 36.0–46.0)
Hemoglobin: 11.1 g/dL — ABNORMAL LOW (ref 12.0–15.0)
Immature Granulocytes: 0 %
Lymphocytes Relative: 15 %
Lymphs Abs: 0.8 10*3/uL (ref 0.7–4.0)
MCH: 25.6 pg — ABNORMAL LOW (ref 26.0–34.0)
MCHC: 29.8 g/dL — ABNORMAL LOW (ref 30.0–36.0)
MCV: 85.9 fL (ref 80.0–100.0)
Monocytes Absolute: 0.4 10*3/uL (ref 0.1–1.0)
Monocytes Relative: 7 %
Neutro Abs: 3.9 10*3/uL (ref 1.7–7.7)
Neutrophils Relative %: 78 %
Platelets: 234 10*3/uL (ref 150–400)
RBC: 4.34 MIL/uL (ref 3.87–5.11)
RDW: 13.8 % (ref 11.5–15.5)
WBC: 5.1 10*3/uL (ref 4.0–10.5)
nRBC: 0 % (ref 0.0–0.2)

## 2018-12-10 LAB — CBG MONITORING, ED: Glucose-Capillary: 119 mg/dL — ABNORMAL HIGH (ref 70–99)

## 2018-12-10 LAB — TROPONIN I: Troponin I: <0.03 ng/mL (ref <0.03)

## 2018-12-10 LAB — I-STAT TROPONIN, ED: Troponin i, poc: 0 ng/mL (ref 0.00–0.08)

## 2018-12-10 MED ORDER — DIPHENHYDRAMINE HCL 25 MG PO CAPS
25.0000 mg | ORAL_CAPSULE | Freq: Once | ORAL | Status: DC
  Filled 2018-12-10: qty 1

## 2018-12-10 MED ORDER — CEPHALEXIN 500 MG PO CAPS: 500.0000 mg | ORAL_CAPSULE | Freq: Three times a day (TID) | ORAL | 0 refills | Status: DC

## 2018-12-10 MED ORDER — CEPHALEXIN 500 MG PO CAPS
500.0000 mg | ORAL_CAPSULE | Freq: Once | ORAL | Status: AC
  Administered 2018-12-10: 500 mg via ORAL
  Filled 2018-12-10: qty 1

## 2018-12-10 NOTE — ED Notes (Signed)
Patient left AMA, refusal to sign AMA discharge as well as last vital signs.

## 2018-12-10 NOTE — ED Provider Notes (Signed)
[facility] EMERGENCY DEPARTMENT Provider Note   CSN: [id number] Arrival date & time: 12/10/18  1444     History   Chief Complaint Chief Complaint  Patient presents with  . Chest Pain    HPI [name] is a 42 y.o. female.  Patient complains of chest pain and aching all over.  Also her blood sugar has been dropping for the last couple days and has been in the 50s and below 40.  She takes an injection for her sugar once a day on Sundays.  The history is provided by the patient. No language interpreter was used.  Chest Pain   This is a new problem. The current episode started 1 to 2 hours ago. The problem occurs constantly. The problem has not changed since onset.The pain is associated with movement. The pain is present in the substernal region. The pain is at a severity of 3/10. The pain is moderate. The quality of the pain is described as dull. The pain does not radiate. Pertinent negatives include no abdominal pain, no back pain, no cough and no headaches.  Pertinent negatives for past medical history include no seizures.    Past Medical History:  Diagnosis Date  . Anxiety    OCD  . Asthma    as a child  . Cancer [facility]) 2001   renal cell , breast cancer both breast 2011, cervical cancer 2013  . Chronic anemia   . Chronic pain   . Complication of anesthesia    hard to sedate dr hung aware  . Crohn's disease ([place])   . Diabetes mellitus without complication ([place])   . Fracture 05/01/2013   Right foot, in cast  . History of blood transfusion 2 years ago  . Hyperlipidemia   . Hypothyroidism    parathroid runs low  . Meningioma ([place])   . Panic disorder   . PONV (postoperative nausea and vomiting)   . Renal disorder    S/p '11-Kidney transplant-Dr. Florene Glen follows  . Urticaria     Patient Active Problem List   Diagnosis Date Noted  . UTI (lower urinary tract infection) 11/20/2013  . CKD (chronic kidney disease) stage 3, GFR 30-59 ml/min (HCC) 08/25/2013    . Perirectal abscess 08/24/2013  . ESRD (end stage renal disease) ([place]) 03/27/2011  . Kidney transplant status, living unrelated donor 03/27/2011  . Anemia 03/27/2011  . Type 2 diabetes mellitus ([place]) 03/27/2011  . Osteoarthritis of right knee 03/27/2011  . Lumbago 03/27/2011  . HTN (hypertension) 03/27/2011  . HLD (hyperlipidemia) 03/27/2011  . Generalized anxiety disorder 03/27/2011  . Depression 03/27/2011  . Chronic steroid use 03/27/2011  . Crohn's disease ([place]) 03/27/2011  . OCD (obsessive compulsive disorder) 03/27/2011  . 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 Obstructive sleep apnea, adult 03/27/2011    Past Surgical History:  Procedure Laterality Date  . ABDOMINAL HYSTERECTOMY  02/13/2012   Procedure: HYSTERECTOMY ABDOMINAL;  Surgeon: Cyril Mourning, MD;  Location: [facility];  Service: Gynecology;  Laterality: N/A;  . BRAVO [place] STUDY N/A 11/27/2013   Procedure: BRAVO [place];  Surgeon: Beryle Beams, MD;  Location: [facility];  Service: Endoscopy;  Laterality: N/A;  . BRAVO [place] STUDY N/A 12/11/2013   Procedure: BRAVO [place];  Surgeon: Beryle Beams, MD;  Location: [facility];  Service: Endoscopy;  Laterality: N/A;  . breast lumpectomy and reconstruction Bilateral 2011  . BREAST SURGERY     bilateral lumpectomy 4'11  . COLONOSCOPY N/A 09/25/2013  Procedure: COLONOSCOPY;  Surgeon: Beryle Beams, MD;  Location: [facility];  Service: Endoscopy;  Laterality: N/A;  . COLONOSCOPY WITH PROPOFOL N/A 07/29/2015   Procedure: COLONOSCOPY WITH PROPOFOL;  Surgeon: Carol Ada, MD;  Location: [facility];  Service: Endoscopy;  Laterality: N/A;  . CYSTOSCOPY  02/13/2012   Procedure: CYSTOSCOPY;  Surgeon: Franchot Gallo, MD;  Location: [facility];  Service: Urology;  Laterality: N/A;  insertion of ureteral catheter , removed per Dr Diona Fanti   . ESOPHAGEAL MANOMETRY N/A 08/10/2013   Procedure: ESOPHAGEAL MANOMETRY (EM);  Surgeon: Beryle Beams, MD;  Location: [facility];   Service: Endoscopy;  Laterality: N/A;  . ESOPHAGOGASTRODUODENOSCOPY N/A 09/25/2013   Procedure: ESOPHAGOGASTRODUODENOSCOPY (EGD);  Surgeon: Beryle Beams, MD;  Location: [facility];  Service: Endoscopy;  Laterality: N/A;  . ESOPHAGOGASTRODUODENOSCOPY N/A 11/27/2013   Procedure: ESOPHAGOGASTRODUODENOSCOPY (EGD);  Surgeon: Beryle Beams, MD;  Location: [facility];  Service: Endoscopy;  Laterality: N/A;  . ESOPHAGOGASTRODUODENOSCOPY N/A 12/11/2013   Procedure: ESOPHAGOGASTRODUODENOSCOPY (EGD);  Surgeon: Beryle Beams, MD;  Location: [facility];  Service: Endoscopy;  Laterality: N/A;  . GROIN DISSECTION Left 10/09/2013   Procedure: INCISION AND DRAINAGE OF LEFT GROIN ABSCESS;  Surgeon: Shann Medal, MD;  Location: [facility];  Service: General;  Laterality: Left;  . INCISION AND DRAINAGE ABSCESS N/A 08/26/2013   Procedure: INCISION AND DRAINAGE PERIRECTAL ABSCESS;  Surgeon: Jamesetta So, MD;  Location: [facility];  Service: General;  Laterality: N/A;  . KNEE SURGERY Left april 2003   arthroscopy  . LOWER EXTREMITY ARTERIAL DOPLLERS  05/06/2012   Bilat ABIs were attempted-not accessible due to calcified veins. Bilat TBIs demonstrated no obtainable flow through both great toes. Bilat PVR ankle regions demonstrated moderately abnormal pulsatile flow. Lft SFA demonstrated a 50-69% diameter reduction. Lft runoff demonstrated occlusive diseasewith reconstitution of flow noted distally.  Marland Kitchen [place] and 2011   last '11-[facility]  . PARATHYROIDECTOMY  02-20-2001  . SLEEVE GASTROPLASTY     for GERD, hiatal hernia  . TUBAL LIGATION  02-19-2000     OB History    Gravida  1   Para      Term      Preterm      AB  1   Living  0     SAB  1   TAB      Ectopic      Multiple      Live Births               Home Medications    Prior to Admission medications   Medication Sig Start Date End Date Taking? Authorizing Provider  acetaminophen (TYLENOL 8 HOUR) 650 MG  CR tablet Take 1 tablet (650 mg total) by mouth every 8 (eight) hours. 05/08/18   Varney Biles, MD  acyclovir ointment (ZOVIRAX) 5 % Apply 1 application topically 3 (three) times daily as needed (outbreak).     [provider]  albuterol (PROAIR HFA) 108 (90 Base) MCG/ACT inhaler Inhale 2 puffs into the lungs every 4 (four) hours as needed for wheezing or shortness of breath. 04/11/17   Valentina Shaggy, MD  ALPRAZolam Duanne Moron) 0.5 MG tablet Take 0.5 mg by mouth 2 (two) times daily.     [provider]  amoxicillin-clavulanate (AUGMENTIN) 875-125 MG tablet Take 1 tablet by mouth 2 (two) times daily. One po bid x 7 days 07/04/18   Sherwood Gambler, MD  calcitRIOL (ROCALTROL) 0.25 MCG capsule Take 4 capsules  by mouth 2 (two) times daily. 04/04/12   [provider]  calcium carbonate (TUMS) 500 MG chewable tablet Chew 1-2 tablets by mouth daily as needed for heartburn.  04/04/12   [provider]  cephALEXin (KEFLEX) 500 MG capsule Take 1 capsule (500 mg total) by mouth 3 (three) times daily. 12/10/18   Milton Ferguson, MD  cyclobenzaprine (FLEXERIL) 10 MG tablet Take 10 mg by mouth 2 (two) times daily as needed for muscle spasms.  05/28/17   [provider]  dicyclomine (BENTYL) 10 MG capsule Take 20 mg by mouth 3 (three) times daily as needed for spasms.     [provider]  doxepin (SINEQUAN) 10 MG capsule Take 10 mg by mouth at bedtime. 04/03/18 04/03/19  [provider]  doxycycline (VIBRAMYCIN) 100 MG capsule Take 1 capsule (100 mg total) by mouth 2 (two) times daily. 07/07/18   Davonna Belling, MD  EPINEPHrine 0.3 mg/0.3 mL IJ SOAJ injection Use as directed, if administer call 911 02/08/17   [provider]  FLUoxetine (PROZAC) 20 MG capsule Take 60 mg by mouth daily.     [provider]  hydrocortisone 2.5 % ointment Apply 1 application topically every 6 (six) hours as needed (for irritation).     [provider]   hydrOXYzine (ATARAX/VISTARIL) 50 MG tablet Take 50 mg by mouth at bedtime.     [provider]  levocetirizine (XYZAL) 5 MG tablet Take 5 mg by mouth every evening.    [provider]  loratadine (CLARITIN) 10 MG tablet Take 10 mg by mouth every morning.     [provider]  magic mouthwash SOLN Take 5 mLs by mouth every 4 (four) hours as needed for mouth pain.  04/12/16   [provider]  metFORMIN (GLUCOPHAGE-XR) 500 MG 24 hr tablet Take 1,000 mg by mouth 2 (two) times daily.    [provider]  metoCLOPramide (REGLAN) 10 MG tablet Take 10 mg by mouth 3 (three) times daily as needed for nausea.    [provider]  mycophenolate (MYFORTIC) 180 MG EC tablet Take 540 mg by mouth 2 (two) times daily.     Niel Hummer, NP  omeprazole (PRILOSEC) 40 MG capsule Take 40 mg by mouth 2 (two) times daily.     [provider]  ondansetron (ZOFRAN) 8 MG tablet Take by mouth every 8 (eight) hours as needed for nausea or vomiting.    [provider]  Oxycodone HCl 10 MG TABS Take 10 mg by mouth 3 (three) times daily as needed for severe pain.     [provider]  predniSONE (DELTASONE) 10 MG tablet Take 10 mg by mouth daily with breakfast.     [provider]  promethazine (PHENERGAN) 25 MG tablet Take 1 tablet (25 mg total) by mouth every 6 (six) hours as needed for nausea. 06/11/17   Davonna Belling, MD  rosuvastatin (CRESTOR) 5 MG tablet Take 5 mg by mouth at bedtime.    [provider]  sucralfate (CARAFATE) 1 g tablet Take 1 g by mouth 4 (four) times daily -  with meals and at bedtime.    [provider]  tacrolimus (PROGRAF) 1 MG capsule Take 3 mg by mouth 2 (two) times daily.    [provider]  valACYclovir (VALTREX) 500 MG tablet Take 500-1,000 mg by mouth See admin instructions. 2 on the first day then 1 tab day 2-5    [provider]  Family History Family History    Problem Relation Age of Onset  . Diabetes Mother   . Hypertension Mother   . Asthma Mother   . Diabetes Father   . Hypertension Father   . Cancer Father        Prostate cancer  . Asthma Father   . Obesity Sister   . Eczema Sister   . Stroke Maternal Grandmother   . Heart attack Maternal Grandfather   . Polycystic ovary syndrome Sister     Social History Social History   Tobacco Use  . Smoking status: Never Smoker  . Smokeless tobacco: Never Used  Substance Use Topics  . Alcohol use: No  . Drug use: No     Allergies   Barium-containing compounds; Dilaudid [hydromorphone hcl]; Ivp dye [iodinated diagnostic agents]; Shellfish allergy; Vancomycin; Aspirin; Adhesive [tape]; Infed [iron dextran]; Sulfa antibiotics; and Iopamidol   Review of Systems Review of Systems  Constitutional: Negative for appetite change and fatigue.  HENT: Negative for congestion, ear discharge and sinus pressure.   Eyes: Negative for discharge.  Respiratory: Negative for cough.   Cardiovascular: Positive for chest pain.  Gastrointestinal: Negative for abdominal pain and diarrhea.  Endocrine:       Low glucose  Genitourinary: Negative for frequency and hematuria.  Musculoskeletal: Negative for back pain.  Skin: Negative for rash.  Neurological: Negative for seizures and headaches.  Psychiatric/Behavioral: Negative for hallucinations.     Physical Exam Updated Vital Signs BP 135/81 (BP Location: Left Arm)   Pulse 70   Temp 98.1 F (36.7 C) (Oral)   Resp 15   Ht 5\' 2"  (1.575 m)   Wt 72.6 kg   LMP 01/30/2012   SpO2 99%   BMI 29.26 kg/m   Physical Exam Constitutional:      Appearance: She is well-developed.  HENT:     Head: Normocephalic.     Mouth/Throat:     Mouth: Mucous membranes are moist.  Eyes:     General: No scleral icterus.    Conjunctiva/sclera: Conjunctivae normal.  Neck:     Musculoskeletal: Neck supple.     Thyroid: No thyromegaly.  Cardiovascular:     Rate  and Rhythm: Normal rate and regular rhythm.     Heart sounds: No murmur. No friction rub. No gallop.   Pulmonary:     Breath sounds: No stridor. No wheezing or rales.  Chest:     Chest wall: No tenderness.  Abdominal:     General: There is no distension.     Tenderness: There is no abdominal tenderness. There is no rebound.  Musculoskeletal: Normal range of motion.  Lymphadenopathy:     Cervical: No cervical adenopathy.  Skin:    Findings: No erythema or rash.  Neurological:     Mental Status: She is alert and oriented to person, place, and time.     Motor: No abnormal muscle tone.     Coordination: Coordination normal.  Psychiatric:        Behavior: Behavior normal.      ED Treatments / Results  Labs (all labs ordered are listed, but only abnormal results are displayed) Labs Reviewed  CBC WITH DIFFERENTIAL/PLATELET - Abnormal; Notable for the following components:      Result Value   Hemoglobin 11.1 (*)    MCH 25.6 (*)    MCHC 29.8 (*)    All other components within normal limits  COMPREHENSIVE METABOLIC PANEL - Abnormal; Notable for the following components:   BUN  21 (*)    Creatinine, Ser 1.28 (*)    Calcium 7.5 (*)    GFR calc non Af Amer 51 (*)    GFR calc Af Amer 59 (*)    All other components within normal limits  URINALYSIS, ROUTINE W REFLEX MICROSCOPIC - Abnormal; Notable for the following components:   APPearance HAZY (*)    Hgb urine dipstick SMALL (*)    Leukocytes, UA LARGE (*)    WBC, UA >50 (*)    Bacteria, UA RARE (*)    All other components within normal limits  CBG MONITORING, ED - Abnormal; Notable for the following components:   Glucose-Capillary 119 (*)    All other components within normal limits  URINE CULTURE  TROPONIN I  I-STAT TROPONIN, ED    EKG EKG Interpretation  Date/Time:  Wednesday December 10 2018 15:08:06 EST Ventricular Rate:  72 PR Interval:    QRS Duration: 82 QT Interval:  470 QTC Calculation: 515 R  Axis:   24 Text Interpretation:  Sinus rhythm Supraventricular bigeminy Low voltage, precordial leads Prolonged QT interval Baseline wander in lead(s) III Confirmed by Milton Ferguson [phone]) on 12/10/2018 3:12:23 PM   Radiology Dg Chest 2 View  Result Date: 12/10/2018 CLINICAL DATA:  Chest tightness for several days. History of childhood asthma. EXAM: CHEST - 2 VIEW COMPARISON:  Chest radiograph July 07, 2018 FINDINGS: Cardiac silhouette is mildly enlarged and unchanged. Mediastinal silhouette is not suspicious. No pleural effusion or focal consolidation. No pneumothorax. Surgical clips RIGHT neck most compatible with thyroidectomy. Surgical clips in the included right abdomen compatible with cholecystectomy. Surgical clips project RIGHT retroperitoneum. Severe vascular calcification LEFT humeral soft tissues versus myositis ossificans. IMPRESSION: Mild cardiomegaly.  No acute pulmonary process. Aortic Atherosclerosis (ICD10-I70.0). Electronically Signed   By: Elon Alas M.D.   On: 12/10/2018 16:00    Procedures Procedures (including critical care time)  Medications Ordered in ED Medications  diphenhydrAMINE (BENADRYL) capsule 25 mg (25 mg Oral Not Given 12/10/18 1810)  cephALEXin (KEFLEX) capsule 500 mg (has no administration in time range)     Initial Impression / Assessment and Plan / ED Course  I have reviewed the triage vital signs and the nursing notes.  Pertinent labs & imaging results that were available during my care of the patient were reviewed by me and considered in my medical decision making (see chart for details).     Patient has had low sugar below 50 and below 40 for 2 days off and on.  Labs show she has a urinary tract infection.  I discussed the case with the hospitalist who felt like she needed to be admitted for the low sugars.  I talked to the patient told her she may have a stroke or die because her sugar continues to be low and she has refused admission and  would like to leave AMA.  I did give the patient some Keflex for her urinary tract infection but I explained to her that once she is decided she will come to the hospital and be admitted she should go directly to the hospital.  She understands that she may have a debilitating stroke or die  Final Clinical Impressions(s) / ED Diagnoses   Final diagnoses:  Hypoglycemia    ED Discharge Orders         Ordered    cephALEXin (KEFLEX) 500 MG capsule  3 times daily     12/10/18 1916  Milton Ferguson, MD 12/10/18 [phone]

## 2018-12-10 NOTE — ED Provider Notes (Signed)
[facility] EMERGENCY DEPARTMENT Provider Note   CSN: [id number] Arrival date & time: 12/10/18  1444     History   Chief Complaint Chief Complaint  Patient presents with  . Chest Pain    HPI [name] is a 42 y.o. female.  Patient complains of chest pain.  Patient states that her blood sugar for the last 2 days has been running 50 and below.  It has read just low on her blood sugar which means it is below 40.  Every time she eats her sugar comes back up but for 2 days it has continued to drop down again.  She is also had some chest discomfort  The history is provided by the patient. No language interpreter was used.  Illness  This is a new problem. The current episode started more than 2 days ago. The problem occurs constantly. The problem has not changed since onset.Associated symptoms include chest pain. Pertinent negatives include no abdominal pain and no headaches. Nothing aggravates the symptoms. Nothing relieves the symptoms. She has tried nothing for the symptoms. The treatment provided no relief.    Past Medical History:  Diagnosis Date  . Anxiety    OCD  . Asthma    as a child  . Cancer [facility]) 2001   renal cell , breast cancer both breast 2011, cervical cancer 2013  . Chronic anemia   . Chronic pain   . Complication of anesthesia    hard to sedate dr hung aware  . Crohn's disease ([place])   . Diabetes mellitus without complication ([place])   . Fracture 05/01/2013   Right foot, in cast  . History of blood transfusion 2 years ago  . Hyperlipidemia   . Hypothyroidism    parathroid runs low  . Meningioma ([place])   . Panic disorder   . PONV (postoperative nausea and vomiting)   . Renal disorder    S/p '11-Kidney transplant-Dr. Florene Glen follows  . Urticaria     Patient Active Problem List   Diagnosis Date Noted  . UTI (lower urinary tract infection) 11/20/2013  . CKD (chronic kidney disease) stage 3, GFR 30-59 ml/min (HCC) 08/25/2013  . Perirectal  abscess 08/24/2013  . ESRD (end stage renal disease) ([place]) 03/27/2011  . Kidney transplant status, living unrelated donor 03/27/2011  . Anemia 03/27/2011  . Type 2 diabetes mellitus ([place]) 03/27/2011  . Osteoarthritis of right knee 03/27/2011  . Lumbago 03/27/2011  . HTN (hypertension) 03/27/2011  . HLD (hyperlipidemia) 03/27/2011  . Generalized anxiety disorder 03/27/2011  . Depression 03/27/2011  . Chronic steroid use 03/27/2011  . Crohn's disease ([place]) 03/27/2011  . OCD (obsessive compulsive disorder) 03/27/2011  . 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 Obstructive sleep apnea, adult 03/27/2011    Past Surgical History:  Procedure Laterality Date  . ABDOMINAL HYSTERECTOMY  02/13/2012   Procedure: HYSTERECTOMY ABDOMINAL;  Surgeon: Cyril Mourning, MD;  Location: [facility];  Service: Gynecology;  Laterality: N/A;  . BRAVO [place] STUDY N/A 11/27/2013   Procedure: BRAVO [place];  Surgeon: Beryle Beams, MD;  Location: [facility];  Service: Endoscopy;  Laterality: N/A;  . BRAVO [place] STUDY N/A 12/11/2013   Procedure: BRAVO [place];  Surgeon: Beryle Beams, MD;  Location: [facility];  Service: Endoscopy;  Laterality: N/A;  . breast lumpectomy and reconstruction Bilateral 2011  . BREAST SURGERY     bilateral lumpectomy 4'11  . COLONOSCOPY N/A 09/25/2013   Procedure: COLONOSCOPY;  Surgeon: Beryle Beams,  MD;  Location: [facility];  Service: Endoscopy;  Laterality: N/A;  . COLONOSCOPY WITH PROPOFOL N/A 07/29/2015   Procedure: COLONOSCOPY WITH PROPOFOL;  Surgeon: Carol Ada, MD;  Location: [facility];  Service: Endoscopy;  Laterality: N/A;  . CYSTOSCOPY  02/13/2012   Procedure: CYSTOSCOPY;  Surgeon: Franchot Gallo, MD;  Location: [facility];  Service: Urology;  Laterality: N/A;  insertion of ureteral catheter , removed per Dr Diona Fanti   . ESOPHAGEAL MANOMETRY N/A 08/10/2013   Procedure: ESOPHAGEAL MANOMETRY (EM);  Surgeon: Beryle Beams, MD;  Location: [facility];  Service:  Endoscopy;  Laterality: N/A;  . ESOPHAGOGASTRODUODENOSCOPY N/A 09/25/2013   Procedure: ESOPHAGOGASTRODUODENOSCOPY (EGD);  Surgeon: Beryle Beams, MD;  Location: [facility];  Service: Endoscopy;  Laterality: N/A;  . ESOPHAGOGASTRODUODENOSCOPY N/A 11/27/2013   Procedure: ESOPHAGOGASTRODUODENOSCOPY (EGD);  Surgeon: Beryle Beams, MD;  Location: [facility];  Service: Endoscopy;  Laterality: N/A;  . ESOPHAGOGASTRODUODENOSCOPY N/A 12/11/2013   Procedure: ESOPHAGOGASTRODUODENOSCOPY (EGD);  Surgeon: Beryle Beams, MD;  Location: [facility];  Service: Endoscopy;  Laterality: N/A;  . GROIN DISSECTION Left 10/09/2013   Procedure: INCISION AND DRAINAGE OF LEFT GROIN ABSCESS;  Surgeon: Shann Medal, MD;  Location: [facility];  Service: General;  Laterality: Left;  . INCISION AND DRAINAGE ABSCESS N/A 08/26/2013   Procedure: INCISION AND DRAINAGE PERIRECTAL ABSCESS;  Surgeon: Jamesetta So, MD;  Location: [facility];  Service: General;  Laterality: N/A;  . KNEE SURGERY Left april 2003   arthroscopy  . LOWER EXTREMITY ARTERIAL DOPLLERS  05/06/2012   Bilat ABIs were attempted-not accessible due to calcified veins. Bilat TBIs demonstrated no obtainable flow through both great toes. Bilat PVR ankle regions demonstrated moderately abnormal pulsatile flow. Lft SFA demonstrated a 50-69% diameter reduction. Lft runoff demonstrated occlusive diseasewith reconstitution of flow noted distally.  Marland Kitchen Rhame and 2011   last '11-[facility]  . PARATHYROIDECTOMY  02-20-2001  . SLEEVE GASTROPLASTY     for GERD, hiatal hernia  . TUBAL LIGATION  02-19-2000     OB History    Gravida  1   Para      Term      Preterm      AB  1   Living  0     SAB  1   TAB      Ectopic      Multiple      Live Births               Home Medications    Prior to Admission medications   Medication Sig Start Date End Date Taking? Authorizing Provider  acetaminophen (TYLENOL 8 HOUR) 650 MG CR tablet  Take 1 tablet (650 mg total) by mouth every 8 (eight) hours. 05/08/18   Varney Biles, MD  acyclovir ointment (ZOVIRAX) 5 % Apply 1 application topically 3 (three) times daily as needed (outbreak).     [provider]  albuterol (PROAIR HFA) 108 (90 Base) MCG/ACT inhaler Inhale 2 puffs into the lungs every 4 (four) hours as needed for wheezing or shortness of breath. 04/11/17   Valentina Shaggy, MD  ALPRAZolam Duanne Moron) 0.5 MG tablet Take 0.5 mg by mouth 2 (two) times daily.     [provider]  amoxicillin-clavulanate (AUGMENTIN) 875-125 MG tablet Take 1 tablet by mouth 2 (two) times daily. One po bid x 7 days 07/04/18   Sherwood Gambler, MD  calcitRIOL (ROCALTROL) 0.25 MCG capsule Take 4 capsules by mouth 2 (two) times daily. 04/04/12  [provider]  calcium carbonate (TUMS) 500 MG chewable tablet Chew 1-2 tablets by mouth daily as needed for heartburn.  04/04/12   [provider]  cyclobenzaprine (FLEXERIL) 10 MG tablet Take 10 mg by mouth 2 (two) times daily as needed for muscle spasms.  05/28/17   [provider]  dicyclomine (BENTYL) 10 MG capsule Take 20 mg by mouth 3 (three) times daily as needed for spasms.     [provider]  doxepin (SINEQUAN) 10 MG capsule Take 10 mg by mouth at bedtime. 04/03/18 04/03/19  [provider]  doxycycline (VIBRAMYCIN) 100 MG capsule Take 1 capsule (100 mg total) by mouth 2 (two) times daily. 07/07/18   Davonna Belling, MD  EPINEPHrine 0.3 mg/0.3 mL IJ SOAJ injection Use as directed, if administer call 911 02/08/17   [provider]  FLUoxetine (PROZAC) 20 MG capsule Take 60 mg by mouth daily.     [provider]  hydrocortisone 2.5 % ointment Apply 1 application topically every 6 (six) hours as needed (for irritation).     [provider]  hydrOXYzine (ATARAX/VISTARIL) 50 MG tablet Take 50 mg by mouth at bedtime.     [provider]  levocetirizine (XYZAL) 5 MG  tablet Take 5 mg by mouth every evening.    [provider]  loratadine (CLARITIN) 10 MG tablet Take 10 mg by mouth every morning.     [provider]  magic mouthwash SOLN Take 5 mLs by mouth every 4 (four) hours as needed for mouth pain.  04/12/16   [provider]  metFORMIN (GLUCOPHAGE-XR) 500 MG 24 hr tablet Take 1,000 mg by mouth 2 (two) times daily.    [provider]  metoCLOPramide (REGLAN) 10 MG tablet Take 10 mg by mouth 3 (three) times daily as needed for nausea.    [provider]  mycophenolate (MYFORTIC) 180 MG EC tablet Take 540 mg by mouth 2 (two) times daily.     Niel Hummer, NP  omeprazole (PRILOSEC) 40 MG capsule Take 40 mg by mouth 2 (two) times daily.     [provider]  ondansetron (ZOFRAN) 8 MG tablet Take by mouth every 8 (eight) hours as needed for nausea or vomiting.    [provider]  Oxycodone HCl 10 MG TABS Take 10 mg by mouth 3 (three) times daily as needed for severe pain.     [provider]  predniSONE (DELTASONE) 10 MG tablet Take 10 mg by mouth daily with breakfast.     [provider]  promethazine (PHENERGAN) 25 MG tablet Take 1 tablet (25 mg total) by mouth every 6 (six) hours as needed for nausea. 06/11/17   Davonna Belling, MD  rosuvastatin (CRESTOR) 5 MG tablet Take 5 mg by mouth at bedtime.    [provider]  sucralfate (CARAFATE) 1 g tablet Take 1 g by mouth 4 (four) times daily -  with meals and at bedtime.    [provider]  tacrolimus (PROGRAF) 1 MG capsule Take 3 mg by mouth 2 (two) times daily.    [provider]  valACYclovir (VALTREX) 500 MG tablet Take 500-1,000 mg by mouth See admin instructions. 2 on the first day then 1 tab day 2-5    [provider]    Family History Family History  Problem Relation Age of Onset  . Diabetes Mother   . Hypertension Mother   . Asthma Mother   . Diabetes Father   .  Hypertension Father    . Cancer Father        Prostate cancer  . Asthma Father   . Obesity Sister   . Eczema Sister   . Stroke Maternal Grandmother   . Heart attack Maternal Grandfather   . Polycystic ovary syndrome Sister     Social History Social History   Tobacco Use  . Smoking status: Never Smoker  . Smokeless tobacco: Never Used  Substance Use Topics  . Alcohol use: No  . Drug use: No     Allergies   Barium-containing compounds; Dilaudid [hydromorphone hcl]; Ivp dye [iodinated diagnostic agents]; Shellfish allergy; Vancomycin; Aspirin; Adhesive [tape]; Infed [iron dextran]; Sulfa antibiotics; and Iopamidol   Review of Systems Review of Systems  Constitutional: Negative for appetite change and fatigue.  HENT: Negative for congestion, ear discharge and sinus pressure.   Eyes: Negative for discharge.  Respiratory: Negative for cough.   Cardiovascular: Positive for chest pain.  Gastrointestinal: Negative for abdominal pain and diarrhea.  Genitourinary: Negative for frequency and hematuria.  Musculoskeletal: Negative for back pain.  Skin: Negative for rash.  Neurological: Negative for seizures and headaches.  Psychiatric/Behavioral: Negative for hallucinations.     Physical Exam Updated Vital Signs BP 135/81 (BP Location: Left Arm)   Pulse 70   Temp 98.1 F (36.7 C) (Oral)   Resp 15   Ht 5\' 2"  (1.575 m)   Wt 72.6 kg   LMP 01/30/2012   SpO2 99%   BMI 29.26 kg/m   Physical Exam Constitutional:      Appearance: She is well-developed.  HENT:     Head: Normocephalic.  Eyes:     General: No scleral icterus.    Conjunctiva/sclera: Conjunctivae normal.  Neck:     Musculoskeletal: Neck supple.     Thyroid: No thyromegaly.  Cardiovascular:     Rate and Rhythm: Normal rate and regular rhythm.     Heart sounds: No murmur. No friction rub. No gallop.   Pulmonary:     Breath sounds: No stridor. No wheezing or rales.  Chest:     Chest wall: No tenderness.  Abdominal:      General: There is no distension.     Tenderness: There is no abdominal tenderness. There is no rebound.  Musculoskeletal: Normal range of motion.  Lymphadenopathy:     Cervical: No cervical adenopathy.  Skin:    Findings: No erythema or rash.  Neurological:     Mental Status: She is alert and oriented to person, place, and time.     Motor: No abnormal muscle tone.     Coordination: Coordination normal.  Psychiatric:        Behavior: Behavior normal.      ED Treatments / Results  Labs (all labs ordered are listed, but only abnormal results are displayed) Labs Reviewed  CBC WITH DIFFERENTIAL/PLATELET - Abnormal; Notable for the following components:      Result Value   Hemoglobin 11.1 (*)    MCH 25.6 (*)    MCHC 29.8 (*)    All other components within normal limits  COMPREHENSIVE METABOLIC PANEL - Abnormal; Notable for the following components:   BUN 21 (*)    Creatinine, Ser 1.28 (*)    Calcium 7.5 (*)    GFR calc non Af Amer 51 (*)    GFR calc Af Amer 59 (*)    All other components within normal limits  URINALYSIS, ROUTINE W REFLEX MICROSCOPIC - Abnormal; Notable for the following components:  APPearance HAZY (*)    Hgb urine dipstick SMALL (*)    Leukocytes, UA LARGE (*)    WBC, UA >50 (*)    Bacteria, UA RARE (*)    All other components within normal limits  CBG MONITORING, ED - Abnormal; Notable for the following components:   Glucose-Capillary 119 (*)    All other components within normal limits  URINE CULTURE  TROPONIN I  I-STAT TROPONIN, ED    EKG EKG Interpretation  Date/Time:  Wednesday December 10 2018 15:08:06 EST Ventricular Rate:  72 PR Interval:    QRS Duration: 82 QT Interval:  470 QTC Calculation: 515 R Axis:   24 Text Interpretation:  Sinus rhythm Supraventricular bigeminy Low voltage, precordial leads Prolonged QT interval Baseline wander in lead(s) III Confirmed by Milton Ferguson [phone]) on 12/10/2018 3:12:23 PM   Radiology Dg Chest 2  View  Result Date: 12/10/2018 CLINICAL DATA:  Chest tightness for several days. History of childhood asthma. EXAM: CHEST - 2 VIEW COMPARISON:  Chest radiograph July 07, 2018 FINDINGS: Cardiac silhouette is mildly enlarged and unchanged. Mediastinal silhouette is not suspicious. No pleural effusion or focal consolidation. No pneumothorax. Surgical clips RIGHT neck most compatible with thyroidectomy. Surgical clips in the included right abdomen compatible with cholecystectomy. Surgical clips project RIGHT retroperitoneum. Severe vascular calcification LEFT humeral soft tissues versus myositis ossificans. IMPRESSION: Mild cardiomegaly.  No acute pulmonary process. Aortic Atherosclerosis (ICD10-I70.0). Electronically Signed   By: Elon Alas M.D.   On: 12/10/2018 16:00    Procedures Procedures (including critical care time)  Medications Ordered in ED Medications  diphenhydrAMINE (BENADRYL) capsule 25 mg (25 mg Oral Not Given 12/10/18 1810)     Initial Impression / Assessment and Plan / ED Course  I have reviewed the triage vital signs and the nursing notes.  Pertinent labs & imaging results that were available during my care of the patient were reviewed by me and considered in my medical decision making (see chart for details).     The patient's blood sugar has consistently been critically low for 2 days off and on.  She takes medicine once a week for injections on Sundays for her diabetes.  I spoke with the medical doctor on-call and he felt she needed to be admitted for observation because we cannot control her blood sugar.  It could easily drop in middle the night and she could have a significant problem like stroke or even die.  I have explained this to the patient very thoroughly and she refused to stay in the hospital so she left AMA Final Clinical Impressions(s) / ED Diagnoses   Final diagnoses:  None    ED Discharge Orders    None       Milton Ferguson, MD 12/13/18  1146

## 2018-12-10 NOTE — ED Triage Notes (Signed)
Patient with chest pain with generalized numbness.  Chest pain off and on for 2 days.  Patient has freestyle CBG check on left upper arm, patient states blood sugars have been reading "low".  Patient with fistula on right upper arm not accessed.  Patient is alert, oriented with no noted distress.

## 2018-12-10 NOTE — Discharge Instructions (Addendum)
You need to be admitted to the hospital or your sugar could drop so low that you have a stroke or die.  I have explained this to you and you have decided to leave the hospital [place].  Once you decide that you want to be admitted to the hospital I would head to hospital  as  soon as possible

## 2018-12-12 LAB — URINE CULTURE: Culture: <10000 — AB

## 2019-06-01 ENCOUNTER — Emergency Department (HOSPITAL_COMMUNITY)
Admission: EM | Admit: 2019-06-01 | Discharge: 2019-06-01 | Discharge status: Against Medical Advice | Payer: BC Managed Care – PPO

## 2019-06-01 NOTE — ED Notes (Signed)
Pt requesting to lay in the floor behind triage room.

## 2019-09-25 ENCOUNTER — Other Ambulatory Visit: Payer: Self-pay

## 2019-09-26 LAB — CBC WITH DIFFERENTIAL/PLATELET
Basophils Absolute: 0 10*3/uL (ref 0.0–0.2)
Basos: 0 %
EOS (ABSOLUTE): 0 10*3/uL (ref 0.0–0.4)
Eos: 0 %
Hematocrit: 33.9 % — ABNORMAL LOW (ref 34.0–46.6)
Hemoglobin: 10.8 g/dL — ABNORMAL LOW (ref 11.1–15.9)
Immature Grans (Abs): 0 10*3/uL (ref 0.0–0.1)
Immature Granulocytes: 1 %
Lymphocytes Absolute: 1.6 10*3/uL (ref 0.7–3.1)
Lymphs: 25 %
MCH: 26.5 pg — ABNORMAL LOW (ref 26.6–33.0)
MCHC: 31.9 g/dL (ref 31.5–35.7)
MCV: 83 fL (ref 79–97)
Monocytes Absolute: 0.5 10*3/uL (ref 0.1–0.9)
Monocytes: 7 %
Neutrophils Absolute: 4.3 10*3/uL (ref 1.4–7.0)
Neutrophils: 67 %
Platelets: 257 10*3/uL (ref 150–450)
RBC: 4.07 x10E6/uL (ref 3.77–5.28)
RDW: 13.2 % (ref 11.7–15.4)
WBC: 6.4 10*3/uL (ref 3.4–10.8)

## 2019-09-26 LAB — FERRITIN: Ferritin: 771 ng/mL — ABNORMAL HIGH (ref 15–150)

## 2019-12-04 ENCOUNTER — Encounter (HOSPITAL_COMMUNITY): Payer: Self-pay

## 2019-12-04 ENCOUNTER — Emergency Department (HOSPITAL_COMMUNITY)
Admission: EM | Admit: 2019-12-04 | Discharge: 2019-12-04 | Disposition: A | Discharge status: Home / Self Care | Payer: BC Managed Care – PPO | Attending: Emergency Medicine | Admitting: Emergency Medicine

## 2019-12-04 ENCOUNTER — Other Ambulatory Visit: Payer: Self-pay

## 2019-12-04 DIAGNOSIS — I120 Hypertensive chronic kidney disease with stage 5 chronic kidney disease or end stage renal disease: Secondary | ICD-10-CM | POA: Diagnosis not present

## 2019-12-04 DIAGNOSIS — Z7984 Long term (current) use of oral hypoglycemic drugs: Secondary | ICD-10-CM | POA: Diagnosis not present

## 2019-12-04 DIAGNOSIS — E860 Dehydration: Secondary | ICD-10-CM | POA: Diagnosis not present

## 2019-12-04 DIAGNOSIS — E039 Hypothyroidism, unspecified: Secondary | ICD-10-CM | POA: Insufficient documentation

## 2019-12-04 DIAGNOSIS — J45909 Unspecified asthma, uncomplicated: Secondary | ICD-10-CM | POA: Diagnosis not present

## 2019-12-04 DIAGNOSIS — Z853 Personal history of malignant neoplasm of breast: Secondary | ICD-10-CM | POA: Diagnosis not present

## 2019-12-04 DIAGNOSIS — R630 Anorexia: Secondary | ICD-10-CM | POA: Diagnosis not present

## 2019-12-04 DIAGNOSIS — N186 End stage renal disease: Secondary | ICD-10-CM | POA: Insufficient documentation

## 2019-12-04 DIAGNOSIS — R531 Weakness: Secondary | ICD-10-CM | POA: Diagnosis present

## 2019-12-04 DIAGNOSIS — Z940 Kidney transplant status: Secondary | ICD-10-CM | POA: Diagnosis not present

## 2019-12-04 DIAGNOSIS — Z8553 Personal history of malignant neoplasm of renal pelvis: Secondary | ICD-10-CM | POA: Diagnosis not present

## 2019-12-04 DIAGNOSIS — E1122 Type 2 diabetes mellitus with diabetic chronic kidney disease: Secondary | ICD-10-CM | POA: Insufficient documentation

## 2019-12-04 DIAGNOSIS — Z79899 Other long term (current) drug therapy: Secondary | ICD-10-CM | POA: Insufficient documentation

## 2019-12-04 LAB — CBC WITH DIFFERENTIAL/PLATELET
Abs Immature Granulocytes: 0.02 10*3/uL (ref 0.00–0.07)
Basophils Absolute: 0 10*3/uL (ref 0.0–0.1)
Basophils Relative: 0 %
Eosinophils Absolute: 0 10*3/uL (ref 0.0–0.5)
Eosinophils Relative: 0 %
HCT: 34.9 % — ABNORMAL LOW (ref 36.0–46.0)
Hemoglobin: 10.9 g/dL — ABNORMAL LOW (ref 12.0–15.0)
Immature Granulocytes: 1 %
Lymphocytes Relative: 20 %
Lymphs Abs: 0.8 10*3/uL (ref 0.7–4.0)
MCH: 26.5 pg (ref 26.0–34.0)
MCHC: 31.2 g/dL (ref 30.0–36.0)
MCV: 84.9 fL (ref 80.0–100.0)
Monocytes Absolute: 0.4 10*3/uL (ref 0.1–1.0)
Monocytes Relative: 10 %
Neutro Abs: 2.8 10*3/uL (ref 1.7–7.7)
Neutrophils Relative %: 69 %
Platelets: 138 10*3/uL — ABNORMAL LOW (ref 150–400)
RBC: 4.11 MIL/uL (ref 3.87–5.11)
RDW: 12.9 % (ref 11.5–15.5)
WBC: 4 10*3/uL (ref 4.0–10.5)
nRBC: 0 % (ref 0.0–0.2)

## 2019-12-04 LAB — BASIC METABOLIC PANEL
Anion gap: 11 (ref 5–15)
BUN: 23 mg/dL — ABNORMAL HIGH (ref 6–20)
CO2: 19 mmol/L — ABNORMAL LOW (ref 22–32)
Calcium: 8 mg/dL — ABNORMAL LOW (ref 8.9–10.3)
Chloride: 101 mmol/L (ref 98–111)
Creatinine, Ser: 1.76 mg/dL — ABNORMAL HIGH (ref 0.44–1.00)
GFR calc Af Amer: 40 mL/min — ABNORMAL LOW (ref >60)
GFR calc non Af Amer: 34 mL/min — ABNORMAL LOW (ref >60)
Glucose, Bld: 113 mg/dL — ABNORMAL HIGH (ref 70–99)
Potassium: 4.6 mmol/L (ref 3.5–5.1)
Sodium: 131 mmol/L — ABNORMAL LOW (ref 135–145)

## 2019-12-04 MED ORDER — SODIUM CHLORIDE 0.9 % IV BOLUS
500.0000 mL | Freq: Once | INTRAVENOUS | Status: AC
  Administered 2019-12-04: 500 mL via INTRAVENOUS

## 2019-12-04 MED ORDER — ONDANSETRON HCL 4 MG/2ML IJ SOLN
4.0000 mg | Freq: Once | INTRAMUSCULAR | Status: AC
  Administered 2019-12-04: 4 mg via INTRAVENOUS
  Filled 2019-12-04: qty 2

## 2019-12-04 NOTE — ED Provider Notes (Signed)
[facility] Provider Note   CSN: [id number] Arrival date & time: 12/04/19  4098     History Chief Complaint  Patient presents with  . Weakness    [name] is a 43 y.o. female.  Patient presents to the ER for evaluation of generalized weakness.  She reports that she has had no appetite, has not eaten much over the last week or so.  She thought it was because she missed her B12 shot, but she was able to get the and did not feel any better.  She tried to call her doctor yesterday but could not get through.  Patient has been trying to drink but feels like she might be dehydrated.  She has not had any new pains, does have chronic pain secondary to multiple medical problems.  She sees a pain management specialist.  She has not had any vomiting or diarrhea.  She denies urinary symptoms.        Past Medical History:  Diagnosis Date  . Anxiety    OCD  . Asthma    as a child  . Cancer [facility]) 2001   renal cell , breast cancer both breast 2011, cervical cancer 2013  . Chronic anemia   . Chronic pain   . Complication of anesthesia    hard to sedate dr hung aware  . Crohn's disease ([place])   . Diabetes mellitus without complication ([place])   . Fracture 05/01/2013   Right foot, in cast  . History of blood transfusion 2 years ago  . Hyperlipidemia   . Hypothyroidism    parathroid runs low  . Meningioma ([place])   . Panic disorder   . PONV (postoperative nausea and vomiting)   . Renal disorder    S/p '11-Kidney transplant-Dr. Florene Glen follows  . Urticaria     Patient Active Problem List   Diagnosis Date Noted  . UTI (lower urinary tract infection) 11/20/2013  . CKD (chronic kidney disease) stage 3, GFR 30-59 ml/min 08/25/2013  . Perirectal abscess 08/24/2013  . ESRD (end stage renal disease) ([place]) 03/27/2011  . Kidney transplant status, living unrelated donor 03/27/2011  . Anemia 03/27/2011  . Type 2 diabetes mellitus ([place]) 03/27/2011  .  Osteoarthritis of right knee 03/27/2011  . Lumbago 03/27/2011  . HTN (hypertension) 03/27/2011  . HLD (hyperlipidemia) 03/27/2011  . Generalized anxiety disorder 03/27/2011  . Depression 03/27/2011  . Chronic steroid use 03/27/2011  . Crohn's disease ([place]) 03/27/2011  . OCD (obsessive compulsive disorder) 03/27/2011  . 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 Obstructive sleep apnea, adult 03/27/2011    Past Surgical History:  Procedure Laterality Date  . ABDOMINAL HYSTERECTOMY  02/13/2012   Procedure: HYSTERECTOMY ABDOMINAL;  Surgeon: Cyril Mourning, MD;  Location: [facility];  Service: Gynecology;  Laterality: N/A;  . BRAVO [place] STUDY N/A 11/27/2013   Procedure: BRAVO [place];  Surgeon: Beryle Beams, MD;  Location: [facility];  Service: Endoscopy;  Laterality: N/A;  . BRAVO [place] STUDY N/A 12/11/2013   Procedure: BRAVO [place];  Surgeon: Beryle Beams, MD;  Location: [facility];  Service: Endoscopy;  Laterality: N/A;  . breast lumpectomy and reconstruction Bilateral 2011  . BREAST SURGERY     bilateral lumpectomy 4'11  . COLONOSCOPY N/A 09/25/2013   Procedure: COLONOSCOPY;  Surgeon: Beryle Beams, MD;  Location: [facility];  Service: Endoscopy;  Laterality: N/A;  . COLONOSCOPY WITH PROPOFOL N/A 07/29/2015   Procedure: COLONOSCOPY WITH PROPOFOL;  Surgeon: Saralyn Pilar  Benson Norway, MD;  Location: [facility];  Service: Endoscopy;  Laterality: N/A;  . CYSTOSCOPY  02/13/2012   Procedure: CYSTOSCOPY;  Surgeon: Franchot Gallo, MD;  Location: [facility];  Service: Urology;  Laterality: N/A;  insertion of ureteral catheter , removed per Dr Diona Fanti   . ESOPHAGEAL MANOMETRY N/A 08/10/2013   Procedure: ESOPHAGEAL MANOMETRY (EM);  Surgeon: Beryle Beams, MD;  [facility]: [facility];  Service: Endoscopy;  Laterality: N/A;  . ESOPHAGOGASTRODUODENOSCOPY N/A 09/25/2013   Procedure: ESOPHAGOGASTRODUODENOSCOPY (EGD);  Surgeon: Beryle Beams, MD;  Location: [facility];  Service: Endoscopy;  Laterality:  N/A;  . ESOPHAGOGASTRODUODENOSCOPY N/A 11/27/2013   Procedure: ESOPHAGOGASTRODUODENOSCOPY (EGD);  Surgeon: Beryle Beams, MD;  Location: [facility];  Service: Endoscopy;  Laterality: N/A;  . ESOPHAGOGASTRODUODENOSCOPY N/A 12/11/2013   Procedure: ESOPHAGOGASTRODUODENOSCOPY (EGD);  Surgeon: Beryle Beams, MD;  Location: [facility];  Service: Endoscopy;  Laterality: N/A;  . GROIN DISSECTION Left 10/09/2013   Procedure: INCISION AND DRAINAGE OF LEFT GROIN ABSCESS;  Surgeon: Shann Medal, MD;  Location: [facility];  Service: General;  Laterality: Left;  . INCISION AND DRAINAGE ABSCESS N/A 08/26/2013   Procedure: INCISION AND DRAINAGE PERIRECTAL ABSCESS;  Surgeon: Jamesetta So, MD;  Location: [facility];  Service: General;  Laterality: N/A;  . KNEE SURGERY Left april 2003   arthroscopy  . LOWER EXTREMITY ARTERIAL DOPLLERS  05/06/2012   Bilat ABIs were attempted-not accessible due to calcified veins. Bilat TBIs demonstrated no obtainable flow through both great toes. Bilat PVR ankle regions demonstrated moderately abnormal pulsatile flow. Lft SFA demonstrated a 50-69% diameter reduction. Lft runoff demonstrated occlusive diseasewith reconstitution of flow noted distally.  Marland Kitchen [place] and 2011   last '11-[facility]  . PARATHYROIDECTOMY  02-20-2001  . SLEEVE GASTROPLASTY     for GERD, hiatal hernia  . TUBAL LIGATION  02-19-2000     OB History    Gravida  1   Para      Term      Preterm      AB  1   Living  0     SAB  1   TAB      Ectopic      Multiple      Live Births              Family History  Problem Relation Age of Onset  . Diabetes Mother   . Hypertension Mother   . Asthma Mother   . Diabetes Father   . Hypertension Father   . Cancer Father        Prostate cancer  . Asthma Father   . Obesity Sister   . Eczema Sister   . Stroke Maternal Grandmother   . Heart attack Maternal Grandfather   . Polycystic ovary syndrome Sister     Social  History   Tobacco Use  . Smoking status: Never Smoker  . Smokeless tobacco: Never Used  Substance Use Topics  . Alcohol use: No  . Drug use: No    Home Medications Prior to Admission medications   Medication Sig Start Date End Date Taking? Authorizing Provider  acetaminophen (TYLENOL 8 HOUR) 650 MG CR tablet Take 1 tablet (650 mg total) by mouth every 8 (eight) hours. 05/08/18   Varney Biles, MD  acyclovir ointment (ZOVIRAX) 5 % Apply 1 application topically 3 (three) times daily as needed (outbreak).     [provider]  albuterol (PROAIR HFA) 108 (90 Base) MCG/ACT inhaler Inhale 2  puffs into the lungs every 4 (four) hours as needed for wheezing or shortness of breath. 04/11/17   Valentina Shaggy, MD  ALPRAZolam Duanne Moron) 0.5 MG tablet Take 0.5 mg by mouth 2 (two) times daily.     [provider]  amoxicillin-clavulanate (AUGMENTIN) 875-125 MG tablet Take 1 tablet by mouth 2 (two) times daily. One po bid x 7 days 07/04/18   Sherwood Gambler, MD  calcitRIOL (ROCALTROL) 0.25 MCG capsule Take 4 capsules by mouth 2 (two) times daily. 04/04/12   [provider]  calcium carbonate (TUMS) 500 MG chewable tablet Chew 1-2 tablets by mouth daily as needed for heartburn.  04/04/12   [provider]  cephALEXin (KEFLEX) 500 MG capsule Take 1 capsule (500 mg total) by mouth 3 (three) times daily. 12/10/18   Milton Ferguson, MD  cyclobenzaprine (FLEXERIL) 10 MG tablet Take 10 mg by mouth 2 (two) times daily as needed for muscle spasms.  05/28/17   [provider]  dicyclomine (BENTYL) 10 MG capsule Take 20 mg by mouth 3 (three) times daily as needed for spasms.     [provider]  doxepin (SINEQUAN) 10 MG capsule Take 10 mg by mouth at bedtime. 04/03/18 04/03/19  [provider]  doxycycline (VIBRAMYCIN) 100 MG capsule Take 1 capsule (100 mg total) by mouth 2 (two) times daily. 07/07/18   Davonna Belling, MD  EPINEPHrine 0.3 mg/0.3 mL IJ SOAJ  injection Use as directed, if administer call 911 02/08/17   [provider]  FLUoxetine (PROZAC) 20 MG capsule Take 60 mg by mouth daily.     [provider]  hydrocortisone 2.5 % ointment Apply 1 application topically every 6 (six) hours as needed (for irritation).     [provider]  hydrOXYzine (ATARAX/VISTARIL) 50 MG tablet Take 50 mg by mouth at bedtime.     [provider]  levocetirizine (XYZAL) 5 MG tablet Take 5 mg by mouth every evening.    [provider]  loratadine (CLARITIN) 10 MG tablet Take 10 mg by mouth every morning.     [provider]  magic mouthwash SOLN Take 5 mLs by mouth every 4 (four) hours as needed for mouth pain.  04/12/16   [provider]  metFORMIN (GLUCOPHAGE-XR) 500 MG 24 hr tablet Take 1,000 mg by mouth 2 (two) times daily.    [provider]  metoCLOPramide (REGLAN) 10 MG tablet Take 10 mg by mouth 3 (three) times daily as needed for nausea.    [provider]  mycophenolate (MYFORTIC) 180 MG EC tablet Take 540 mg by mouth 2 (two) times daily.     Niel Hummer, NP  omeprazole (PRILOSEC) 40 MG capsule Take 40 mg by mouth 2 (two) times daily.     [provider]  ondansetron (ZOFRAN) 8 MG tablet Take by mouth every 8 (eight) hours as needed for nausea or vomiting.    [provider]  Oxycodone HCl 10 MG TABS Take 10 mg by mouth 3 (three) times daily as needed for severe pain.     [provider]  predniSONE (DELTASONE) 10 MG tablet Take 10 mg by mouth daily with breakfast.     [provider]  promethazine (PHENERGAN) 25 MG tablet Take 1 tablet (25 mg total) by mouth every 6 (six) hours as needed for nausea. 06/11/17   Davonna Belling, MD  rosuvastatin (CRESTOR) 5 MG tablet Take 5 mg by mouth at bedtime.    [provider]  sucralfate (CARAFATE) 1 g tablet Take 1 g by mouth 4 (four) times daily -  with meals and at bedtime.    [provider]  tacrolimus (PROGRAF) 1 MG capsule Take 3 mg by mouth 2 (two) times daily.    [provider]  valACYclovir (VALTREX) 500 MG tablet Take 500-1,000 mg by mouth See admin instructions. 2 on the first day then 1 tab day 2-5    [provider]    Allergies    Barium-containing compounds, Dilaudid [hydromorphone hcl], Ivp dye [iodinated diagnostic agents], Shellfish allergy, Vancomycin, Aspirin, Adhesive [tape], Infed [iron dextran], Sulfa antibiotics, and Iopamidol  Review of Systems   Review of Systems  Constitutional: Positive for appetite change and fatigue.  All other systems reviewed and are negative.   Physical Exam Updated Vital Signs BP 91/66 (BP Location: Left Arm)   Pulse 78   Temp 99.7 F (37.6 C)   Resp 15   Ht 5\' 2"  (1.575 m)   Wt 72.6 kg   LMP 01/30/2012   SpO2 98%   BMI 29.26 kg/m   Physical Exam Vitals and nursing note reviewed.  Constitutional:      General: She is not in acute distress.    Appearance: Normal appearance. She is well-developed.  HENT:     Head: Normocephalic and atraumatic.     Right Ear: Hearing normal.     Left Ear: Hearing normal.     Nose: Nose normal.  Eyes:     Conjunctiva/sclera: Conjunctivae normal.     Pupils: Pupils are equal, round, and reactive to light.  Cardiovascular:     Rate and Rhythm: Regular rhythm.     Heart sounds: S1 normal and S2 normal. No murmur. No friction rub. No gallop.   Pulmonary:     Effort: Pulmonary effort is normal. No respiratory distress.     Breath sounds: Normal breath sounds.  Chest:     Chest wall: No tenderness.  Abdominal:     General: Bowel sounds are normal.     Palpations: Abdomen is soft.     Tenderness: There is no abdominal tenderness. There is no guarding or rebound. Negative signs include Murphy's sign and McBurney's sign.     Hernia: No hernia is present.  Musculoskeletal:        General: Normal range of motion.     Cervical back: Normal range of  motion and neck supple.  Skin:    General: Skin is warm and dry.     Findings: No rash.  Neurological:     Mental Status: She is alert and oriented to person, place, and time.     GCS: GCS eye subscore is 4. GCS verbal subscore is 5. GCS motor subscore is 6.     Cranial Nerves: No cranial nerve deficit.     Sensory: No sensory deficit.     Coordination: Coordination normal.  Psychiatric:        Speech: Speech normal.        Behavior: Behavior normal.        Thought Content: Thought content normal.     ED Results / Procedures / Treatments   Labs (all labs ordered are listed, but only abnormal results are displayed) Labs Reviewed  CBC WITH DIFFERENTIAL/PLATELET  BASIC METABOLIC PANEL  URINALYSIS, ROUTINE W REFLEX MICROSCOPIC    EKG None  Radiology No results found.  Procedures Procedures (including critical care time)  Medications Ordered in ED Medications  sodium chloride  0.9 % bolus 500 mL (has no administration in time range)    ED Course  I have reviewed the triage vital signs and the nursing notes.  Pertinent labs & imaging results that were available during my care of the patient were reviewed by me and considered in my medical decision making (see chart for details).    MDM Rules/Calculators/A&P     CHA2DS2/VAS Stroke Risk Points      N/A >= 2 Points: High Risk  1 - 1.99 Points: Medium Risk  0 Points: Low Risk    A final score could not be computed because of missing components.: Last  Change: N/A     This score determines the patient's risk of having a stroke if the  patient has atrial fibrillation.      This score is not applicable to this patient. Components are not  calculated.                   Patient presents to the ER with complaints of generalized weakness and poor appetite.  She does not appear ill.  Blood pressure is on the low side, but she reports that this is normal for her.  She does feel a little dehydrated and thinks that her  "electrolytes might be off".  Will check basic labs and urinalysis, provide IV fluids.  Will sign out to oncoming ER physician to follow results.  Final Clinical Impression(s) / ED Diagnoses Final diagnoses:  Generalized weakness    Rx / DC Orders ED Discharge Orders    None       Cigi Bega, Gwenyth Allegra, MD 12/04/19 [phone]

## 2019-12-04 NOTE — ED Triage Notes (Signed)
Pt states she feels like she her electrolytes are out of balance, states she has been feeling weak and no appetite for over a week.  Pt denies n/v/d, states she has had a headache, no fevers.

## 2019-12-04 NOTE — ED Notes (Signed)
Pt was informed that we need a urine sample. 

## 2019-12-04 NOTE — Discharge Instructions (Signed)
As discussed, your evaluation today has been largely reassuring.  But, it is important that you monitor your condition carefully, and do not hesitate to return to the ED if you develop new, or concerning changes in your condition. ? ?Otherwise, please follow-up with your physician for appropriate ongoing care. ? ?

## 2019-12-04 NOTE — ED Notes (Signed)
Family at bedside. 

## 2019-12-28 ENCOUNTER — Emergency Department (HOSPITAL_COMMUNITY): Payer: BC Managed Care – PPO

## 2019-12-28 ENCOUNTER — Other Ambulatory Visit: Payer: Self-pay

## 2019-12-28 ENCOUNTER — Emergency Department (HOSPITAL_COMMUNITY)
Admission: EM | Admit: 2019-12-28 | Discharge: 2019-12-29 | Discharge status: Against Medical Advice | Payer: BC Managed Care – PPO | Attending: Emergency Medicine | Admitting: Emergency Medicine

## 2019-12-28 ENCOUNTER — Encounter (HOSPITAL_COMMUNITY): Payer: Self-pay | Admitting: Emergency Medicine

## 2019-12-28 DIAGNOSIS — Z5321 Procedure and treatment not carried out due to patient leaving prior to being seen by health care provider: Secondary | ICD-10-CM | POA: Insufficient documentation

## 2019-12-28 DIAGNOSIS — R509 Fever, unspecified: Secondary | ICD-10-CM | POA: Diagnosis present

## 2019-12-28 LAB — CBC WITH DIFFERENTIAL/PLATELET
Abs Immature Granulocytes: 0 10*3/uL (ref 0.00–0.07)
Basophils Absolute: 0 10*3/uL (ref 0.0–0.1)
Basophils Relative: 0 %
Eosinophils Absolute: 0 10*3/uL (ref 0.0–0.5)
Eosinophils Relative: 0 %
HCT: 38.1 % (ref 36.0–46.0)
Hemoglobin: 11.5 g/dL — ABNORMAL LOW (ref 12.0–15.0)
Lymphocytes Relative: 4 %
Lymphs Abs: 1.2 10*3/uL (ref 0.7–4.0)
MCH: 26.2 pg (ref 26.0–34.0)
MCHC: 30.2 g/dL (ref 30.0–36.0)
MCV: 86.8 fL (ref 80.0–100.0)
Monocytes Absolute: 2.4 10*3/uL — ABNORMAL HIGH (ref 0.1–1.0)
Monocytes Relative: 8 %
Neutro Abs: 26.6 10*3/uL — ABNORMAL HIGH (ref 1.7–7.7)
Neutrophils Relative %: 88 %
Platelets: 242 10*3/uL (ref 150–400)
RBC: 4.39 MIL/uL (ref 3.87–5.11)
RDW: 14.2 % (ref 11.5–15.5)
WBC: 30.2 10*3/uL — ABNORMAL HIGH (ref 4.0–10.5)
nRBC: 0 % (ref 0.0–0.2)
nRBC: 0 /100 WBC

## 2019-12-28 LAB — PROTIME-INR
INR: 1.2 (ref 0.8–1.2)
Prothrombin Time: 15 seconds (ref 11.4–15.2)

## 2019-12-28 LAB — COMPREHENSIVE METABOLIC PANEL
ALT: 22 U/L (ref 0–44)
AST: 20 U/L (ref 15–41)
Albumin: 3.4 g/dL — ABNORMAL LOW (ref 3.5–5.0)
Alkaline Phosphatase: 68 U/L (ref 38–126)
Anion gap: 12 (ref 5–15)
BUN: 20 mg/dL (ref 6–20)
CO2: 21 mmol/L — ABNORMAL LOW (ref 22–32)
Calcium: 8.2 mg/dL — ABNORMAL LOW (ref 8.9–10.3)
Chloride: 101 mmol/L (ref 98–111)
Creatinine, Ser: 1.63 mg/dL — ABNORMAL HIGH (ref 0.44–1.00)
GFR calc Af Amer: 44 mL/min — ABNORMAL LOW (ref >60)
GFR calc non Af Amer: 38 mL/min — ABNORMAL LOW (ref >60)
Glucose, Bld: 190 mg/dL — ABNORMAL HIGH (ref 70–99)
Potassium: 3.7 mmol/L (ref 3.5–5.1)
Sodium: 134 mmol/L — ABNORMAL LOW (ref 135–145)
Total Bilirubin: 1.2 mg/dL (ref 0.3–1.2)
Total Protein: 6.9 g/dL (ref 6.5–8.1)

## 2019-12-28 LAB — I-STAT BETA HCG BLOOD, ED (MC, WL, AP ONLY): I-stat hCG, quantitative: 8.7 m[IU]/mL — ABNORMAL HIGH (ref <5)

## 2019-12-28 LAB — LACTIC ACID, PLASMA: Lactic Acid, Venous: 2.5 mmol/L (ref 0.5–1.9)

## 2019-12-28 MED ORDER — ACETAMINOPHEN 325 MG PO TABS
650.0000 mg | ORAL_TABLET | Freq: Once | ORAL | Status: AC | PRN
  Administered 2019-12-28: 650 mg via ORAL
  Filled 2019-12-28: qty 2

## 2019-12-28 NOTE — ED Triage Notes (Signed)
Pt states d/c from [place]  and she  Had pneumonia, was COVID positive 3 weeks  Ago   Per husband pt started to run a fevr last night  ,  Was told to come to ER  For tx

## 2019-12-28 NOTE — ED Notes (Signed)
Pt states she wants to leave   And at first would not take the tylenol for her fever  States she doesn't want to sit oput in waiting room hurts to breath sitting in chair , she went to xary after saying I was not doing anything for her breathing

## 2019-12-28 NOTE — ED Notes (Signed)
Pt called for recheck VS x2, no response.

## 2019-12-28 NOTE — ED Notes (Signed)
Pt stated she is leaving AMA. Pt was told that this Tech was not able to give her any results from lab work. Pt advised to stay. Pt told to return if symptoms worsen. Pt on phone with husband.

## 2020-01-02 LAB — CULTURE, BLOOD (ROUTINE X 2)
Culture: NO GROWTH
Special Requests: ADEQUATE

## 2020-02-24 ENCOUNTER — Ambulatory Visit (HOSPITAL_COMMUNITY): Payer: BC Managed Care – PPO | Attending: Physician Assistant | Admitting: Physical Therapy

## 2020-02-24 ENCOUNTER — Encounter (HOSPITAL_COMMUNITY): Payer: Self-pay

## 2020-02-24 DIAGNOSIS — R2689 Other abnormalities of gait and mobility: Secondary | ICD-10-CM | POA: Insufficient documentation

## 2020-02-24 DIAGNOSIS — M6281 Muscle weakness (generalized): Secondary | ICD-10-CM | POA: Insufficient documentation

## 2020-03-09 ENCOUNTER — Ambulatory Visit (HOSPITAL_COMMUNITY): Payer: BC Managed Care – PPO | Admitting: Physical Therapy

## 2020-03-11 ENCOUNTER — Other Ambulatory Visit: Payer: Self-pay

## 2020-03-11 ENCOUNTER — Ambulatory Visit (HOSPITAL_COMMUNITY): Payer: BC Managed Care – PPO | Admitting: Physical Therapy

## 2020-03-11 ENCOUNTER — Encounter (HOSPITAL_COMMUNITY): Payer: Self-pay | Admitting: Physical Therapy

## 2020-03-11 DIAGNOSIS — M6281 Muscle weakness (generalized): Secondary | ICD-10-CM | POA: Diagnosis not present

## 2020-03-11 DIAGNOSIS — R2689 Other abnormalities of gait and mobility: Secondary | ICD-10-CM

## 2020-03-11 NOTE — Therapy (Signed)
[place], [place], [address] Phone: [phone]   Fax:  [phone]  Physical Therapy Evaluation  Patient Details  Name: [name] MRN: [id number] Date of Birth: Jan 06, 1976 Referring Provider (PT): Roe Coombs PA   Encounter Date: 03/11/2020  PT End of Session - 03/11/20 1832    Visit Number  1    Number of Visits  12    Date for PT Re-Evaluation  04/22/20    Authorization Type  Primary: BCBS PPO; Secondary: [employer] Medicare (check for auth)    Authorization Time Period  03/11/20 - 04/22/20    Progress Note Due on Visit  10    PT Start Time  0820    PT Stop Time  0900    PT Time Calculation (min)  40 min    Activity Tolerance  Patient tolerated treatment well    Behavior During Therapy  Anxious       Past Medical History:  Diagnosis Date  . Anxiety    OCD  . Asthma    as a child  . Cancer [facility]) 2001   renal cell , breast cancer both breast 2011, cervical cancer 2013  . Chronic anemia   . Chronic pain   . Complication of anesthesia    hard to sedate dr hung aware  . Crohn's disease ([place])   . Diabetes mellitus without complication ([place])   . Fracture 05/01/2013   Right foot, in cast  . History of blood transfusion 2 years ago  . Hyperlipidemia   . Hypothyroidism    parathroid runs low  . Meningioma ([place])   . Panic disorder   . PONV (postoperative nausea and vomiting)   . Renal disorder    S/p '11-Kidney transplant-Dr. Florene Glen follows  . Urticaria     Past Surgical History:  Procedure Laterality Date  . ABDOMINAL HYSTERECTOMY  02/13/2012   Procedure: HYSTERECTOMY ABDOMINAL;  Surgeon: Cyril Mourning, MD;  Location: [facility];  Service: Gynecology;  Laterality: N/A;  . BRAVO [place] STUDY N/A 11/27/2013   Procedure: BRAVO [place];  Surgeon: Beryle Beams, MD;  [facility];  Service: Endoscopy;  Laterality: N/A;  . BRAVO [place] STUDY N/A 12/11/2013   Procedure: BRAVO [place];   Surgeon: Beryle Beams, MD;  [facility];  Service: Endoscopy;  Laterality: N/A;  . breast lumpectomy and reconstruction Bilateral 2011  . BREAST SURGERY     bilateral lumpectomy 4'11  . COLONOSCOPY N/A 09/25/2013   Procedure: COLONOSCOPY;  Surgeon: Beryle Beams, MD;  [facility];  Service: Endoscopy;  Laterality: N/A;  . COLONOSCOPY WITH PROPOFOL N/A 07/29/2015   Procedure: COLONOSCOPY WITH PROPOFOL;  Surgeon: Carol Ada, MD;  [facility];  Service: Endoscopy;  Laterality: N/A;  . CYSTOSCOPY  02/13/2012   Procedure: CYSTOSCOPY;  Surgeon: Franchot Gallo, MD;  Location: [facility];  Service: Urology;  Laterality: N/A;  insertion of ureteral catheter , removed per Dr Diona Fanti   . ESOPHAGEAL MANOMETRY N/A 08/10/2013   Procedure: ESOPHAGEAL MANOMETRY (EM);  Surgeon: Beryle Beams, MD;  [facility];  Service: Endoscopy;  Laterality: N/A;  . ESOPHAGOGASTRODUODENOSCOPY N/A 09/25/2013   Procedure: ESOPHAGOGASTRODUODENOSCOPY (EGD);  Surgeon: Beryle Beams, MD;  Location: [facility];  Service: Endoscopy;  Laterality: N/A;  . ESOPHAGOGASTRODUODENOSCOPY N/A 11/27/2013   Procedure: ESOPHAGOGASTRODUODENOSCOPY (EGD);  Surgeon: Beryle Beams, MD;  Location: [facility];  Service: Endoscopy;  Laterality: N/A;  . ESOPHAGOGASTRODUODENOSCOPY N/A 12/11/2013  Procedure: ESOPHAGOGASTRODUODENOSCOPY (EGD);  Surgeon: Beryle Beams, MD;  Location: [facility];  Service: Endoscopy;  Laterality: N/A;  . GROIN DISSECTION Left 10/09/2013   Procedure: INCISION AND DRAINAGE OF LEFT GROIN ABSCESS;  Surgeon: Shann Medal, MD;  [facility];  Service: General;  Laterality: Left;  . INCISION AND DRAINAGE ABSCESS N/A 08/26/2013   Procedure: INCISION AND DRAINAGE PERIRECTAL ABSCESS;  Surgeon: Jamesetta So, MD;  Location: [facility];  Service: General;  Laterality: N/A;  . KNEE SURGERY Left april 2003   arthroscopy  . LOWER EXTREMITY ARTERIAL DOPLLERS  05/06/2012   Bilat ABIs  were attempted-not accessible due to calcified veins. Bilat TBIs demonstrated no obtainable flow through both great toes. Bilat PVR ankle regions demonstrated moderately abnormal pulsatile flow. Lft SFA demonstrated a 50-69% diameter reduction. Lft runoff demonstrated occlusive diseasewith reconstitution of flow noted distally.  [facility] [place] and 2011   last '11-[facility]  . PARATHYROIDECTOMY  02-20-2001  . SLEEVE GASTROPLASTY     for GERD, hiatal hernia  . TUBAL LIGATION  02-19-2000    There were no vitals filed for this visit.   Subjective Assessment - 03/11/20 0825    Subjective  She reported that she has been having a lot of issues since she had Covid-19 in 2020. She is not sure when exactly she had Covid-19, but she thinks she had it near Thanksgiving of 2020 and then again near December 04, 2019. She reported that the first time she was in the hospital for 27 days, then 8 days, then 5 days. Patient reported that initially she was having difficulty with breathing and writing. She stated that she has quite a bit of brain fog.  She reported that she was unable to walk when she was first discharged. She reported that she had home health physical therapy for a few weeks. She stated that she finished with HHPT about 2 weeks ago. Reported that she always has generalized pain long before she was diagnosed with Covid-19 and that the pain she has today is nothing abnormal. Patient reported that she used to be a runner and now the running coordination is harder. She reported she has trouble staying in the bed, and that she gets restless and has some issues with sleep. She said she feels fatigued easily. 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    Pertinent History  History of Covid-19 with multiple hospital admissions    Limitations  Walking;Standing;House hold activities    How long can you sit comfortably?  Tends to move around    How long can you stand comfortably?  15 minutes    How  long can you walk comfortably?  5 minutes    Diagnostic tests  Chest x-rays    Patient Stated Goals  To have better balance and be able to use her legs consistently    Currently in Pain?  Yes    Pain Score  7     Pain Location  Other (Comment)   Generalized pain   Pain Onset  More than a month ago    Pain Frequency  Constant         [facility] PT Assessment - 03/11/20 0001      Assessment   Medical Diagnosis  Weakness; Post Covid    Referring Provider (PT)  Roe Coombs PA    Onset Date/Surgical Date  --   Ongoing symptoms since November   Next MD Visit  unknown    Prior  Therapy  HHPT      Precautions   Precautions  Other (comment)   Post-covid syndrome     Restrictions   Weight Bearing Restrictions  No      Balance Screen   Has the patient fallen in the past 6 months  Yes    How many times?  3    Has the patient had a decrease in activity level because of a fear of falling?   Yes    Is the patient reluctant to leave their home because of a fear of falling?   No      Home Environment   Living Environment  Private residence    Living Arrangements  Spouse/significant other    Type of [place] to enter    Entrance Stairs-Number of Steps  3    Entrance Stairs-Rails  Right    Home Layout  Two level    Alternate Level Stairs-Number of Steps  17    Alternate Level Stairs-Rails  Can reach both    [place]  None      Prior Function   Level of Independence  Independent;Independent with basic ADLs    Vocation  --   Was full-time; planning to go back part-time 03/16/20     Cognition   Overall Cognitive Status  Within Functional Limits for tasks assessed   Reports brain fog     ROM / Strength   AROM / PROM / Strength  Strength      Strength   Strength Assessment Site  Hip;Knee;Ankle    Right/Left Hip  Right;Left    Right Hip Flexion  4/5    Right Hip Extension  3+/5    Right Hip ABduction  4+/5    Left Hip Flexion  4/5    Left Hip  Extension  3+/5    Left Hip ABduction  4+/5    Right/Left Knee  Right;Left    Right Knee Flexion  4+/5    Right Knee Extension  5/5    Left Knee Flexion  4+/5    Left Knee Extension  5/5    Right/Left Ankle  Right;Left    Right Ankle Dorsiflexion  5/5    Left Ankle Dorsiflexion  5/5      Transfers   Five time sit to stand comments   16 seconds; discoordinated      Ambulation/Gait   Ambulation/Gait  Yes    Ambulation Distance (Feet)  285 Feet   2MWT   Assistive device  None    Gait Pattern  Step-through pattern    Ambulation Surface  Level;Indoor    Gait Comments  Patient appears to be thinking about ambulation      Balance   Balance Assessed  --   >30 seconds SLS each LE               Objective measurements completed on examination: See above findings.              PT Education - 03/11/20 1831    Education Details  Discussed examination findings and POC.    Person(s) Educated  Patient    Methods  Explanation    Comprehension  Verbalized understanding       PT Short Term Goals - 03/11/20 1835      PT SHORT TERM GOAL #1   Title  Patient will report understanding and regular compliance with HEP to improve strength and overall functional  mobility.    Time  3    Period  Weeks    Status  New    Target Date  04/01/20        PT Long Term Goals - 03/11/20 1835      PT LONG TERM GOAL #1   Title  Patient will demonstrate ability to perform 5xSTS in 14 seconds or less indicating improved balance and functional strength.    Time  6    Period  Weeks    Status  New    Target Date  04/22/20      PT LONG TERM GOAL #2   Title  Patient will demonstrate ability to ambulate an additional 50 feet on the 2MWT indicating improved gait velocity and safety ambulating in community.    Time  6    Period  Weeks    Target Date  04/22/20      PT LONG TERM GOAL #3   Title  Patient will demonstrate improvement of at least 1/2 MMT strength grade in all deficient  muscle groups to improve gait mechanics and safety.    Time  6    Period  Weeks    Status  New    Target Date  04/22/20             Plan - 03/11/20 1840    Clinical Impression Statement  Patient is a 44 year old female who presents to outpatient physical therapy with primary complaint of weakness and fatigue following multiple hospital admissions in 2020 for Covid-19 related issues. Patient seemed unclear on the details of hospital admissions related to Covid-19. Per chart review the patient was discharged from the hospital on 12/18/20 on 3L O2 readmitted on 12/22/20 with AMS and then presented to hospital on 12/27/19. Patient presents without supplemental O2 this date. Patient presents with decreased LE strength, gait abnormalities and reports of decreased endurance and ability to participate. Patient also reports a continued brain fog and may possibly benefit from a referral to speech therapy. Patient would benefit from skilled physical therapy in order to address the abovementioned deficits and help patient return to her PLOF.    Personal Factors and Comorbidities  Time since onset of injury/illness/exacerbation;Behavior Pattern;Comorbidity 3+    Comorbidities  HTN, DMII, ESRD, Kidney transplant, GAD, OCD, depression    Examination-Activity Limitations  Stand;Locomotion Level;Transfers;Sit;Sleep;Squat;Stairs    Examination-Participation Restrictions  Cleaning;Meal Prep;Community Activity    Stability/Clinical Decision Making  Evolving/Moderate complexity    Clinical Decision Making  Moderate    Rehab Potential  Fair    PT Frequency  2x / week    PT Duration  6 weeks    PT Treatment/Interventions  ADLs/Self Care Home Management;Aquatic Therapy;Cryotherapy;Electrical Stimulation;Moist Heat;DME Instruction;Gait training;Stair training;Functional mobility training;Therapeutic activities;Therapeutic exercise;Balance training;Neuromuscular re-education;Patient/family education;Orthotic  Fit/Training;Manual techniques;Passive range of motion;Dry needling;Energy conservation;Taping    PT Next Visit Plan  Review eval/goals. Ask edu/ab questions. Monitor SpO2 as needed. Focus on LE strengthening, functional strengthening, endurance and gait/balance. Initiate HEP consider: bridges, 4-way hip    PT Home Exercise Plan  Initiate at first session    Recommended Other Services  Possible referral to speech therapy    Consulted and Agree with Plan of Care  Patient       Patient will benefit from skilled therapeutic intervention in order to improve the following deficits and impairments:  Abnormal gait, Improper body mechanics, Pain, Decreased activity tolerance, Decreased endurance, Decreased strength, Decreased balance, Difficulty walking  Visit Diagnosis: Muscle weakness (generalized)  Other abnormalities of gait and mobility     Problem List Patient Active Problem List   Diagnosis Date Noted  . UTI (lower urinary tract infection) 11/20/2013  . CKD (chronic kidney disease) stage 3, GFR 30-59 ml/min 08/25/2013  . Perirectal abscess 08/24/2013  . ESRD (end stage renal disease) ([place]) 03/27/2011  . Kidney transplant status, living unrelated donor 03/27/2011  . Anemia 03/27/2011  . Type 2 diabetes mellitus ([place]) 03/27/2011  . Osteoarthritis of right knee 03/27/2011  . Lumbago 03/27/2011  . HTN (hypertension) 03/27/2011  . HLD (hyperlipidemia) 03/27/2011  . Generalized anxiety disorder 03/27/2011  . Depression 03/27/2011  . Chronic steroid use 03/27/2011  . Crohn's disease ([place]) 03/27/2011  . OCD (obsessive compulsive disorder) 03/27/2011  . 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 Obstructive sleep apnea, adult 03/27/2011   Clarene Critchley PT, DPT 7:00 PM, 03/11/20 [place], [place], [address] Phone: [phone]   Fax:  [phone]  Name: Fusaye Wachtel MRN: [id number] Date of  Birth: 19-Feb-1976

## 2020-03-16 ENCOUNTER — Ambulatory Visit (HOSPITAL_COMMUNITY): Payer: BC Managed Care – PPO | Admitting: Physical Therapy

## 2020-03-17 ENCOUNTER — Encounter (HOSPITAL_COMMUNITY): Payer: Self-pay

## 2020-03-17 ENCOUNTER — Other Ambulatory Visit: Payer: Self-pay

## 2020-03-17 ENCOUNTER — Ambulatory Visit (HOSPITAL_COMMUNITY): Payer: BC Managed Care – PPO

## 2020-03-17 DIAGNOSIS — M6281 Muscle weakness (generalized): Secondary | ICD-10-CM

## 2020-03-17 DIAGNOSIS — R2689 Other abnormalities of gait and mobility: Secondary | ICD-10-CM

## 2020-03-17 NOTE — Therapy (Signed)
[place], [place], [address] Phone: [phone]   Fax:  [phone]  Physical Therapy Treatment  Patient Details  Name: [name] MRN: [id number] Date of Birth: 06-Dec-1976 Referring Provider (PT): Roe Coombs PA   Encounter Date: 03/17/2020  PT End of Session - 03/17/20 1715    Visit Number  2    Number of Visits  12    Date for PT Re-Evaluation  04/22/20    Authorization Type  Primary: BCBS PPO; Secondary: [employer] Medicare (check for auth)    Authorization Time Period  03/11/20 - 04/22/20    Progress Note Due on Visit  10    PT Start Time  1616    PT Stop Time  1655    PT Time Calculation (min)  39 min    Activity Tolerance  Patient tolerated treatment well;Patient limited by pain;No increased pain    Behavior During Therapy  Agitated;WFL for tasks assessed/performed       Past Medical History:  Diagnosis Date  . Anxiety    OCD  . Asthma    as a child  . Cancer [facility]) 2001   renal cell , breast cancer both breast 2011, cervical cancer 2013  . Chronic anemia   . Chronic pain   . Complication of anesthesia    hard to sedate dr hung aware  . Crohn's disease ([place])   . Diabetes mellitus without complication ([place])   . Fracture 05/01/2013   Right foot, in cast  . History of blood transfusion 2 years ago  . Hyperlipidemia   . Hypothyroidism    parathroid runs low  . Meningioma ([place])   . Panic disorder   . PONV (postoperative nausea and vomiting)   . Renal disorder    S/p '11-Kidney transplant-Dr. Florene Glen follows  . Urticaria     Past Surgical History:  Procedure Laterality Date  . ABDOMINAL HYSTERECTOMY  02/13/2012   Procedure: HYSTERECTOMY ABDOMINAL;  Surgeon: Cyril Mourning, MD;  Location: [facility];  Service: Gynecology;  Laterality: N/A;  . BRAVO [place] STUDY N/A 11/27/2013   Procedure: BRAVO [place];  Surgeon: Beryle Beams, MD;  [facility];  Service: Endoscopy;  Laterality:  N/A;  . BRAVO [place] STUDY N/A 12/11/2013   Procedure: BRAVO [place];  Surgeon: Beryle Beams, MD;  [facility];  Service: Endoscopy;  Laterality: N/A;  . breast lumpectomy and reconstruction Bilateral 2011  . BREAST SURGERY     bilateral lumpectomy 4'11  . COLONOSCOPY N/A 09/25/2013   Procedure: COLONOSCOPY;  Surgeon: Beryle Beams, MD;  [facility];  Service: Endoscopy;  Laterality: N/A;  . COLONOSCOPY WITH PROPOFOL N/A 07/29/2015   Procedure: COLONOSCOPY WITH PROPOFOL;  Surgeon: Carol Ada, MD;  [facility];  Service: Endoscopy;  Laterality: N/A;  . CYSTOSCOPY  02/13/2012   Procedure: CYSTOSCOPY;  Surgeon: Franchot Gallo, MD;  Location: [facility];  Service: Urology;  Laterality: N/A;  insertion of ureteral catheter , removed per Dr Diona Fanti   . ESOPHAGEAL MANOMETRY N/A 08/10/2013   Procedure: ESOPHAGEAL MANOMETRY (EM);  Surgeon: Beryle Beams, MD;  [facility];  Service: Endoscopy;  Laterality: N/A;  . ESOPHAGOGASTRODUODENOSCOPY N/A 09/25/2013   Procedure: ESOPHAGOGASTRODUODENOSCOPY (EGD);  Surgeon: Beryle Beams, MD;  Location: [facility];  Service: Endoscopy;  Laterality: N/A;  . ESOPHAGOGASTRODUODENOSCOPY N/A 11/27/2013   Procedure: ESOPHAGOGASTRODUODENOSCOPY (EGD);  Surgeon: Beryle Beams, MD;  Location: [facility];  Service: Endoscopy;  Laterality: N/A;  . ESOPHAGOGASTRODUODENOSCOPY N/A 12/11/2013   Procedure: ESOPHAGOGASTRODUODENOSCOPY (EGD);  Surgeon: Beryle Beams, MD;  Location: [facility];  Service: Endoscopy;  Laterality: N/A;  . GROIN DISSECTION Left 10/09/2013   Procedure: INCISION AND DRAINAGE OF LEFT GROIN ABSCESS;  Surgeon: Shann Medal, MD;  [facility];  Service: General;  Laterality: Left;  . INCISION AND DRAINAGE ABSCESS N/A 08/26/2013   Procedure: INCISION AND DRAINAGE PERIRECTAL ABSCESS;  Surgeon: Jamesetta So, MD;  Location: [facility];  Service: General;  Laterality: N/A;  . KNEE SURGERY Left april 2003    arthroscopy  . LOWER EXTREMITY ARTERIAL DOPLLERS  05/06/2012   Bilat ABIs were attempted-not accessible due to calcified veins. Bilat TBIs demonstrated no obtainable flow through both great toes. Bilat PVR ankle regions demonstrated moderately abnormal pulsatile flow. Lft SFA demonstrated a 50-69% diameter reduction. Lft runoff demonstrated occlusive diseasewith reconstitution of flow noted distally.  Marland Kitchen [place] and 2011   last '11-[facility]  . PARATHYROIDECTOMY  02-20-2001  . SLEEVE GASTROPLASTY     for GERD, hiatal hernia  . TUBAL LIGATION  02-19-2000    There were no vitals filed for this visit.  Subjective Assessment - 03/17/20 1621    Subjective  Pt reports constant pain Bil hips and lower back, pain scale 8/10 today.  Reports she continues to complete the exercises given to her by HHPT    Patient Stated Goals  To have better balance and be able to use her legs consistently    Currently in Pain?  Yes    Pain Score  8     Pain Location  Generalized   LBP and hip   Pain Descriptors / Indicators  Aching;Sore    Pain Type  Chronic pain    Pain Onset  More than a month ago    Pain Frequency  Constant    Aggravating Factors   unsure,    Pain Relieving Factors  unsure, its constant    Effect of Pain on Daily Activities  limits                       [facility] Adult PT Treatment/Exercise - 03/17/20 0001      Exercises   Exercises  Knee/Hip      Knee/Hip Exercises: Standing   Heel Raises  Both;10 reps    Heel Raises Limitations  toe raises    Hip Abduction  Both;10 reps;Knee straight      Knee/Hip Exercises: Seated   Sit to Sand  5 reps;without UE support   cueing for STS mechanics     Knee/Hip Exercises: Supine   Bridges  2 sets;5 reps    Straight Leg Raises  2 sets;5 reps      Knee/Hip Exercises: Sidelying   Hip ABduction  2 sets;5 reps      Knee/Hip Exercises: Prone   Hip Extension  Both;2 sets;5 reps             PT  Education - 03/17/20 1626    Education Details  Reviewed goals, educated importance of compliance with HEP and established exercises for home with proper demonstration and printout given.    Person(s) Educated  Patient    Methods  Explanation;Demonstration;Verbal cues;Handout    Comprehension  Verbalized understanding;Returned demonstration;Verbal cues required       PT Short Term Goals - 03/11/20 1835      PT SHORT TERM GOAL #1   Title  Patient  will report understanding and regular compliance with HEP to improve strength and overall functional mobility.    Time  3    Period  Weeks    Status  New    Target Date  04/01/20        PT Long Term Goals - 03/11/20 1835      PT LONG TERM GOAL #1   Title  Patient will demonstrate ability to perform 5xSTS in 14 seconds or less indicating improved balance and functional strength.    Time  6    Period  Weeks    Status  New    Target Date  04/22/20      PT LONG TERM GOAL #2   Title  Patient will demonstrate ability to ambulate an additional 50 feet on the 2MWT indicating improved gait velocity and safety ambulating in community.    Time  6    Period  Weeks    Target Date  04/22/20      PT LONG TERM GOAL #3   Title  Patient will demonstrate improvement of at least 1/2 MMT strength grade in all deficient muscle groups to improve gait mechanics and safety.    Time  6    Period  Weeks    Status  New    Target Date  04/22/20            Plan - 03/17/20 1805    Clinical Impression Statement  Reviewed goals and educated importance of compliance wiht HEP for maximal strengthening.  Established HEP this session, pt able to demonstrate and verbalized understanding.  Session focus on proximal strenghtening.  No reoprts of increased pain through session, does continue to c/o chronic LBP.    Personal Factors and Comorbidities  Time since onset of injury/illness/exacerbation;Behavior Pattern;Comorbidity 3+    Comorbidities  HTN, DMII, ESRD,  Kidney transplant, GAD, OCD, depression    Examination-Activity Limitations  Stand;Locomotion Level;Transfers;Sit;Sleep;Squat;Stairs    Examination-Participation Restrictions  Cleaning;Meal Prep;Community Activity    Stability/Clinical Decision Making  Evolving/Moderate complexity    Clinical Decision Making  Moderate    Rehab Potential  Fair    PT Frequency  2x / week    PT Duration  6 weeks    PT Treatment/Interventions  ADLs/Self Care Home Management;Aquatic Therapy;Cryotherapy;Electrical Stimulation;Moist Heat;DME Instruction;Gait training;Stair training;Functional mobility training;Therapeutic activities;Therapeutic exercise;Balance training;Neuromuscular re-education;Patient/family education;Orthotic Fit/Training;Manual techniques;Passive range of motion;Dry needling;Energy conservation;Taping    PT Next Visit Plan  Review compliance with HEP. Ask edu/ab questions. Focus on LE strengthening, functional strengthening, endurance and gait/balance.    PT Home Exercise Plan  03/25: Bridge and 4way hip SLR       Patient will benefit from skilled therapeutic intervention in order to improve the following deficits and impairments:  Abnormal gait, Improper body mechanics, Pain, Decreased activity tolerance, Decreased endurance, Decreased strength, Decreased balance, Difficulty walking  Visit Diagnosis: Muscle weakness (generalized)  Other abnormalities of gait and mobility     Problem List Patient Active Problem List   Diagnosis Date Noted  . UTI (lower urinary tract infection) 11/20/2013  . CKD (chronic kidney disease) stage 3, GFR 30-59 ml/min 08/25/2013  . Perirectal abscess 08/24/2013  . ESRD (end stage renal disease) ([place]) 03/27/2011  . Kidney transplant status, living unrelated donor 03/27/2011  . Anemia 03/27/2011  . Type 2 diabetes mellitus ([place]) 03/27/2011  . Osteoarthritis of right knee 03/27/2011  . Lumbago 03/27/2011  . HTN (hypertension) 03/27/2011  . HLD  (hyperlipidemia) 03/27/2011  . Generalized anxiety disorder 03/27/2011  .  Depression 03/27/2011  . Chronic steroid use 03/27/2011  . Crohn's disease ([place]) 03/27/2011  . OCD (obsessive compulsive disorder) 03/27/2011  . 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 Obstructive sleep apnea, adult 03/27/2011   Ihor Austin, LPTA/CLT; CBIS [phone]  Aldona Lento 03/17/2020, 6:15 PM  [facility] [address] [place], [place], [address] Phone: [phone]   Fax:  [phone]  Name: Breonna Gafford MRN: [id number] Date of Birth: [birth year]

## 2020-03-17 NOTE — Patient Instructions (Signed)
Bridge    Lie back, legs bent. Inhale, pressing hips up. Keeping ribs in, lengthen lower back. Exhale, rolling down along spine from top. Repeat 5-10 times. Do 1-2 sessions per day.  http://pm.exer.us/55   Copyright  VHI. All rights reserved.  Straight Leg Raise    Tighten stomach and slowly raise locked right leg 12 inches from floor. Repeat 10 times per set. Do 2 sets per day.  Complete 4 days a week.  http://orth.exer.us/1103   Copyright  VHI. All rights reserved.   Abduction    Lift leg up toward ceiling. Return.  Repeat 10 times each leg. Do 2 sessions per day.  http://gt2.exer.us/386   Copyright  VHI. All rights reserved.   Hip Extension: 2-4 Inches    Tighten gluteal muscle. Lift one leg 10 times. Restabilize pelvis. Repeat with other leg. Keep pelvis still. Be sure pelvis does not rotate and back does not arch. Do 1 sets, 1 times per day.  http://ss.exer.us/63   Copyright  VHI. All rights reserved.

## 2020-03-18 ENCOUNTER — Ambulatory Visit: Payer: BC Managed Care – PPO | Attending: Internal Medicine

## 2020-03-18 DIAGNOSIS — Z23 Encounter for immunization: Secondary | ICD-10-CM

## 2020-03-18 NOTE — Progress Notes (Signed)
   Covid-19 Vaccination Clinic  Name:  Valleri Hendricksen    MRN: [id number] DOB: [birth year]  03/18/2020  Ms. Emanuelle Hammerstrom was scheduled to be observed post Covid-19 immunization for 30 minutes based on pre-vaccination screening. Pt refused to comply with 30 minutes, observed for 15 minutes with incident. Complaints of itching, refused benadryl at site, she stated she would take her nondrowsy benadryl in her car. Left site ambulatory in stable condition.  She was provided with Vaccine Information Sheet and instruction to access the V-Safe system.   Ms. Nargis Abrams was instructed to call 911 with any severe reactions post vaccine: Marland Kitchen Difficulty breathing  . Swelling of face and throat  . A fast heartbeat  . A bad rash all over body  . Dizziness and weakness   Immunizations Administered    Name Date Dose VIS Date Route   Moderna COVID-19 Vaccine 03/18/2020  1:19 PM 0.5 mL 11/24/2019 Intramuscular   Manufacturer: Moderna   Lot: [id number]   [place]: [id number]

## 2020-03-21 ENCOUNTER — Ambulatory Visit (HOSPITAL_COMMUNITY): Payer: BC Managed Care – PPO | Admitting: Physical Therapy

## 2020-03-21 ENCOUNTER — Telehealth (HOSPITAL_COMMUNITY): Payer: Self-pay | Admitting: Physical Therapy

## 2020-03-21 NOTE — Telephone Encounter (Signed)
Called patient about missed appointment today. Left voicemail reminder about upcoming appointment on 03/23/20.  2:30 PM, 03/21/20 Josue Hector PT DPT  Physical Therapist with [facility]  [phone]

## 2020-03-23 ENCOUNTER — Other Ambulatory Visit: Payer: Self-pay

## 2020-03-23 ENCOUNTER — Ambulatory Visit (HOSPITAL_COMMUNITY): Payer: BC Managed Care – PPO | Admitting: Physical Therapy

## 2020-03-23 DIAGNOSIS — M6281 Muscle weakness (generalized): Secondary | ICD-10-CM

## 2020-03-23 DIAGNOSIS — R2689 Other abnormalities of gait and mobility: Secondary | ICD-10-CM

## 2020-03-23 NOTE — Therapy (Signed)
[place], [place], [address] Phone: [phone]   Fax:  [phone]  Physical Therapy Treatment  Patient Details  Name: [name] MRN: [id number] Date of Birth: 02-May-1976 Referring Provider (PT): Roe Coombs PA   Encounter Date: 03/23/2020  PT End of Session - 03/23/20 1307    Visit Number  3    Number of Visits  12    Date for PT Re-Evaluation  04/22/20    Authorization Type  Primary: BCBS PPO; Secondary: [employer] Medicare (check for auth)    Authorization Time Period  03/11/20 - 04/22/20    Progress Note Due on Visit  10    PT Start Time  1306    PT Stop Time  1344    PT Time Calculation (min)  38 min    Activity Tolerance  Patient tolerated treatment well;Patient limited by pain;No increased pain    Behavior During Therapy  Agitated;WFL for tasks assessed/performed       Past Medical History:  Diagnosis Date  . Anxiety    OCD  . Asthma    as a child  . Cancer [facility]) 2001   renal cell , breast cancer both breast 2011, cervical cancer 2013  . Chronic anemia   . Chronic pain   . Complication of anesthesia    hard to sedate dr hung aware  . Crohn's disease ([place])   . Diabetes mellitus without complication ([place])   . Fracture 05/01/2013   Right foot, in cast  . History of blood transfusion 2 years ago  . Hyperlipidemia   . Hypothyroidism    parathroid runs low  . Meningioma ([place])   . Panic disorder   . PONV (postoperative nausea and vomiting)   . Renal disorder    S/p '11-Kidney transplant-Dr. Florene Glen follows  . Urticaria     Past Surgical History:  Procedure Laterality Date  . ABDOMINAL HYSTERECTOMY  02/13/2012   Procedure: HYSTERECTOMY ABDOMINAL;  Surgeon: Cyril Mourning, MD;  Location: [place];  Service: Gynecology;  Laterality: N/A;  . BRAVO [place] STUDY N/A 11/27/2013   Procedure: BRAVO [place];  Surgeon: Beryle Beams, MD;  [facility];  Service: Endoscopy;  Laterality:  N/A;  . BRAVO [place] STUDY N/A 12/11/2013   Procedure: BRAVO [place];  Surgeon: Beryle Beams, MD;  [facility];  Service: Endoscopy;  Laterality: N/A;  . breast lumpectomy and reconstruction Bilateral 2011  . BREAST SURGERY     bilateral lumpectomy 4'11  . COLONOSCOPY N/A 09/25/2013   Procedure: COLONOSCOPY;  Surgeon: Beryle Beams, MD;  [facility];  Service: Endoscopy;  Laterality: N/A;  . COLONOSCOPY WITH PROPOFOL N/A 07/29/2015   Procedure: COLONOSCOPY WITH PROPOFOL;  Surgeon: Carol Ada, MD;  [facility];  Service: Endoscopy;  Laterality: N/A;  . CYSTOSCOPY  02/13/2012   Procedure: CYSTOSCOPY;  Surgeon: Franchot Gallo, MD;  Location: [facility];  Service: Urology;  Laterality: N/A;  insertion of ureteral catheter , removed per Dr Diona Fanti   . ESOPHAGEAL MANOMETRY N/A 08/10/2013   Procedure: ESOPHAGEAL MANOMETRY (EM);  Surgeon: Beryle Beams, MD;  [facility];  Service: Endoscopy;  Laterality: N/A;  . ESOPHAGOGASTRODUODENOSCOPY N/A 09/25/2013   Procedure: ESOPHAGOGASTRODUODENOSCOPY (EGD);  Surgeon: Beryle Beams, MD;  [facility];  Service: Endoscopy;  Laterality: N/A;  . ESOPHAGOGASTRODUODENOSCOPY N/A 11/27/2013   Procedure: ESOPHAGOGASTRODUODENOSCOPY (EGD);  Surgeon: Beryle Beams, MD;  Location[facility];  Service: Endoscopy;  Laterality: N/A;  . ESOPHAGOGASTRODUODENOSCOPY N/A 12/11/2013   Procedure: ESOPHAGOGASTRODUODENOSCOPY (EGD);  Surgeon: Beryle Beams, MD;  Location: [facility];  Service: Endoscopy;  Laterality: N/A;  . GROIN DISSECTION Left 10/09/2013   Procedure: INCISION AND DRAINAGE OF LEFT GROIN ABSCESS;  Surgeon: Shann Medal, MD;  [facility];  Service: General;  Laterality: Left;  . INCISION AND DRAINAGE ABSCESS N/A 08/26/2013   Procedure: INCISION AND DRAINAGE PERIRECTAL ABSCESS;  Surgeon: Jamesetta So, MD;  Location: [facility];  Service: General;  Laterality: N/A;  . KNEE SURGERY Left april 2003    arthroscopy  . LOWER EXTREMITY ARTERIAL DOPLLERS  05/06/2012   Bilat ABIs were attempted-not accessible due to calcified veins. Bilat TBIs demonstrated no obtainable flow through both great toes. Bilat PVR ankle regions demonstrated moderately abnormal pulsatile flow. Lft SFA demonstrated a 50-69% diameter reduction. Lft runoff demonstrated occlusive diseasewith reconstitution of flow noted distally.  Marland Kitchen [place] and 2011   last '11-[facility]  . PARATHYROIDECTOMY  02-20-2001  . SLEEVE GASTROPLASTY     for GERD, hiatal hernia  . TUBAL LIGATION  02-19-2000    There were no vitals filed for this visit.  Subjective Assessment - 03/23/20 1308    Subjective  Patient reported generalized pain which she rated it as an 8/10. She said that she has been trying to do her HEP, but she has not been able to do as many as she thought.    Patient Stated Goals  To have better balance and be able to use her legs consistently    Pain Onset  More than a month ago                       [facility] Adult PT Treatment/Exercise - 03/23/20 0001      Knee/Hip Exercises: Standing   Heel Raises  Both;10 reps    Heel Raises Limitations  toe raises    Lateral Step Up  Right;Left;1 set;10 reps;Step Height: 6";Hand Hold: 2    Forward Step Up  Right;Left;1 set;10 reps;Hand Hold: 2;Step Height: 6"    Other Standing Knee Exercises  Tandem ambulation 15' x 1, patient looking at feet and stepping off of line intermittently. Tandem on foam 30'' each LE forward. NBOS on foam 1# weight to shoulder level and lowered x10.       Knee/Hip Exercises: Seated   Sit to Sand  5 reps;without UE support;2 sets      Knee/Hip Exercises: Supine   Bridges  2 sets;5 reps    Bridges Limitations  2-3 second holds    Straight Leg Raises  2 sets;5 reps      Knee/Hip Exercises: Sidelying   Hip ABduction  2 sets;5 reps      Knee/Hip Exercises: Prone   Hip Extension  Both;2 sets;5 reps                PT Short Term Goals - 03/11/20 1835      PT SHORT TERM GOAL #1   Title  Patient will report understanding and regular compliance with HEP to improve strength and overall functional mobility.    Time  3    Period  Weeks    Status  New    Target Date  04/01/20        PT Long Term Goals - 03/11/20 1835      PT LONG TERM GOAL #1   Title  Patient will demonstrate ability to perform  5xSTS in 14 seconds or less indicating improved balance and functional strength.    Time  6    Period  Weeks    Status  New    Target Date  04/22/20      PT LONG TERM GOAL #2   Title  Patient will demonstrate ability to ambulate an additional 50 feet on the 2MWT indicating improved gait velocity and safety ambulating in community.    Time  6    Period  Weeks    Target Date  04/22/20      PT LONG TERM GOAL #3   Title  Patient will demonstrate improvement of at least 1/2 MMT strength grade in all deficient muscle groups to improve gait mechanics and safety.    Time  6    Period  Weeks    Status  New    Target Date  04/22/20            Plan - 03/23/20 1551    Clinical Impression Statement  Began by reviewing patient's HEP. Patient required only minimal cueing to perform HEP. Progressed functional strengthening this session including step-ups forward and lateral. Patient reported at one point not feeling well, but when asked if she would like to take a rest break reported that she would not and that by exercising more she would feel better. There were no visible signs of fatigue. Discussed with patient possibility of a referral to speech therapy and patient consented to a referral to outpatient speech therapy. Therefore, therapist requested a referral for this from patient's referring provider for this.    Personal Factors and Comorbidities  Time since onset of injury/illness/exacerbation;Behavior Pattern;Comorbidity 3+    Comorbidities  HTN, DMII, ESRD, Kidney transplant, GAD, OCD,  depression    Examination-Activity Limitations  Stand;Locomotion Level;Transfers;Sit;Sleep;Squat;Stairs    Examination-Participation Restrictions  Cleaning;Meal Prep;Community Activity    Stability/Clinical Decision Making  Evolving/Moderate complexity    Rehab Potential  Fair    PT Frequency  2x / week    PT Duration  6 weeks    PT Treatment/Interventions  ADLs/Self Care Home Management;Aquatic Therapy;Cryotherapy;Electrical Stimulation;Moist Heat;DME Instruction;Gait training;Stair training;Functional mobility training;Therapeutic activities;Therapeutic exercise;Balance training;Neuromuscular re-education;Patient/family education;Orthotic Fit/Training;Manual techniques;Passive range of motion;Dry needling;Energy conservation;Taping    PT Next Visit Plan  Focus on LE strengthening, functional strengthening, endurance and gait/balance.    PT Home Exercise Plan  03/25: Bridge and 4way hip SLR       Patient will benefit from skilled therapeutic intervention in order to improve the following deficits and impairments:  Abnormal gait, Improper body mechanics, Pain, Decreased activity tolerance, Decreased endurance, Decreased strength, Decreased balance, Difficulty walking  Visit Diagnosis: Muscle weakness (generalized)  Other abnormalities of gait and mobility     Problem List Patient Active Problem List   Diagnosis Date Noted  . UTI (lower urinary tract infection) 11/20/2013  . CKD (chronic kidney disease) stage 3, GFR 30-59 ml/min 08/25/2013  . Perirectal abscess 08/24/2013  . ESRD (end stage renal disease) ([place]) 03/27/2011  . Kidney transplant status, living unrelated donor 03/27/2011  . Anemia 03/27/2011  . Type 2 diabetes mellitus ([place]) 03/27/2011  . Osteoarthritis of right knee 03/27/2011  . Lumbago 03/27/2011  . HTN (hypertension) 03/27/2011  . HLD (hyperlipidemia) 03/27/2011  . Generalized anxiety disorder 03/27/2011  . Depression 03/27/2011  . Chronic steroid use 03/27/2011   . Crohn's disease ([place]) 03/27/2011  . OCD (obsessive compulsive disorder) 03/27/2011  . 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 Obstructive sleep apnea, adult 03/27/2011  Clarene Critchley PT, DPT 3:54 PM, 03/23/20 [place], [place], [address] Phone: [phone]   Fax:  [phone]  Name: Markel Mergenthaler MRN: [id number] Date of Birth: Dec 09, 1976

## 2020-03-29 ENCOUNTER — Telehealth (HOSPITAL_COMMUNITY): Payer: Self-pay | Admitting: Physical Therapy

## 2020-03-29 ENCOUNTER — Ambulatory Visit (HOSPITAL_COMMUNITY): Payer: BC Managed Care – PPO | Admitting: Physical Therapy

## 2020-03-29 NOTE — Telephone Encounter (Signed)
She went to the dentist has an absessed tooth and a fever. She will not be here today - will come Thursday

## 2020-03-31 ENCOUNTER — Ambulatory Visit (HOSPITAL_COMMUNITY): Payer: BC Managed Care – PPO | Admitting: Speech Pathology

## 2020-03-31 ENCOUNTER — Encounter (HOSPITAL_COMMUNITY): Payer: Self-pay | Admitting: Physical Therapy

## 2020-03-31 ENCOUNTER — Other Ambulatory Visit: Payer: Self-pay

## 2020-03-31 ENCOUNTER — Ambulatory Visit (HOSPITAL_COMMUNITY): Payer: BC Managed Care – PPO | Attending: Physician Assistant | Admitting: Physical Therapy

## 2020-03-31 ENCOUNTER — Encounter (HOSPITAL_COMMUNITY): Payer: Self-pay | Admitting: Speech Pathology

## 2020-03-31 DIAGNOSIS — R41841 Cognitive communication deficit: Secondary | ICD-10-CM | POA: Diagnosis present

## 2020-03-31 DIAGNOSIS — M6281 Muscle weakness (generalized): Secondary | ICD-10-CM | POA: Diagnosis present

## 2020-03-31 DIAGNOSIS — R2689 Other abnormalities of gait and mobility: Secondary | ICD-10-CM | POA: Diagnosis present

## 2020-03-31 NOTE — Therapy (Addendum)
[place], [place], [address] Phone: [phone]   Fax:  [phone]  Physical Therapy Treatment  Patient Details  Name: [name] MRN: [id number] Date of Birth: 01-27-1976 Referring Provider (PT): Roe Coombs PA   Encounter Date: 03/31/2020  PT End of Session - 03/31/20 1112    Visit Number  4    Number of Visits  12    Date for PT Re-Evaluation  04/22/20    Authorization Type  Primary: BCBS PPO; Secondary: [employer] Medicare    Authorization Time Period  12 visits approved: 03/11/20 - 04/22/20    Progress Note Due on Visit  10    PT Start Time  1046   Patient arrived late   PT Stop Time  1110    PT Time Calculation (min)  24 min    Equipment Utilized During Treatment  Gait belt    Activity Tolerance  Patient tolerated treatment well;No increased pain    Behavior During Therapy  WFL for tasks assessed/performed       Past Medical History:  Diagnosis Date  . Anxiety    OCD  . Asthma    as a child  . Cancer [facility]) 2001   renal cell , breast cancer both breast 2011, cervical cancer 2013  . Chronic anemia   . Chronic pain   . Complication of anesthesia    hard to sedate dr hung aware  . Crohn's disease ([place])   . Diabetes mellitus without complication ([place])   . Fracture 05/01/2013   Right foot, in cast  . History of blood transfusion 2 years ago  . Hyperlipidemia   . Hypothyroidism    parathroid runs low  . Meningioma ([place])   . Panic disorder   . PONV (postoperative nausea and vomiting)   . Renal disorder    S/p '11-Kidney transplant-Dr. Florene Glen follows  . Urticaria     Past Surgical History:  Procedure Laterality Date  . ABDOMINAL HYSTERECTOMY  02/13/2012   Procedure: HYSTERECTOMY ABDOMINAL;  Surgeon: Cyril Mourning, MD;  Location: [facility];  Service: Gynecology;  Laterality: N/A;  . BRAVO [place] STUDY N/A 11/27/2013   Procedure: BRAVO [place];  Surgeon: Beryle Beams, MD;  [facility];  Service: Endoscopy;  Laterality: N/A;  . BRAVO [place] STUDY N/A 12/11/2013   Procedure: BRAVO [place];  Surgeon: Beryle Beams, MD;  [facility];  Service: Endoscopy;  Laterality: N/A;  . breast lumpectomy and reconstruction Bilateral 2011  . BREAST SURGERY     bilateral lumpectomy 4'11  . COLONOSCOPY N/A 09/25/2013   Procedure: COLONOSCOPY;  Surgeon: Beryle Beams, MD;  [facility];  Service: Endoscopy;  Laterality: N/A;  . COLONOSCOPY WITH PROPOFOL N/A 07/29/2015   Procedure: COLONOSCOPY WITH PROPOFOL;  Surgeon: Carol Ada, MD;  [facility];  Service: Endoscopy;  Laterality: N/A;  . CYSTOSCOPY  02/13/2012   Procedure: CYSTOSCOPY;  Surgeon: Franchot Gallo, MD;  Location: [facility];  Service: Urology;  Laterality: N/A;  insertion of ureteral catheter , removed per Dr Diona Fanti   . ESOPHAGEAL MANOMETRY N/A 08/10/2013   Procedure: ESOPHAGEAL MANOMETRY (EM);  Surgeon: Beryle Beams, MD;  [facility];  Service: Endoscopy;  Laterality: N/A;  . ESOPHAGOGASTRODUODENOSCOPY N/A 09/25/2013   Procedure: ESOPHAGOGASTRODUODENOSCOPY (EGD);  Surgeon: Beryle Beams, MD;  Location[facility];  Service: Endoscopy;  Laterality: N/A;  . ESOPHAGOGASTRODUODENOSCOPY N/A 11/27/2013   Procedure: ESOPHAGOGASTRODUODENOSCOPY (EGD);  Surgeon:  Beryle Beams, MD;  [facility];  Service: Endoscopy;  Laterality: N/A;  . ESOPHAGOGASTRODUODENOSCOPY N/A 12/11/2013   Procedure: ESOPHAGOGASTRODUODENOSCOPY (EGD);  Surgeon: Beryle Beams, MD;  Location[facility];  Service: Endoscopy;  Laterality: N/A;  . GROIN DISSECTION Left 10/09/2013   Procedure: INCISION AND DRAINAGE OF LEFT GROIN ABSCESS;  Surgeon: Shann Medal, MD;  [facility];  Service: General;  Laterality: Left;  . INCISION AND DRAINAGE ABSCESS N/A 08/26/2013   Procedure: INCISION AND DRAINAGE PERIRECTAL ABSCESS;  Surgeon: Jamesetta So, MD;  [facility];  Service: General;  Laterality: N/A;   . KNEE SURGERY Left april 2003   arthroscopy  . LOWER EXTREMITY ARTERIAL DOPLLERS  05/06/2012   Bilat ABIs were attempted-not accessible due to calcified veins. Bilat TBIs demonstrated no obtainable flow through both great toes. Bilat PVR ankle regions demonstrated moderately abnormal pulsatile flow. Lft SFA demonstrated a 50-69% diameter reduction. Lft runoff demonstrated occlusive diseasewith reconstitution of flow noted distally.  Marland Kitchen [place] and 2011   last '11-[facility]  . PARATHYROIDECTOMY  02-20-2001  . SLEEVE GASTROPLASTY     for GERD, hiatal hernia  . TUBAL LIGATION  02-19-2000    There were no vitals filed for this visit.  Subjective Assessment - 03/31/20 1050    Subjective  Patient reported generalized pain which she rated it as an 8/10. Stated she is feeling better from her tooth which was the reason she had to cancel her last appointment.    Patient Stated Goals  To have better balance and be able to use her legs consistently    Currently in Pain?  Yes    Pain Score  8     Pain Location  Other (Comment)   Generalized   Pain Onset  More than a month ago                       [facility] Adult PT Treatment/Exercise - 03/31/20 0001      Knee/Hip Exercises: Standing   Heel Raises  Both;20 reps    Heel Raises Limitations  toe raises x20    Lateral Step Up  Right;Left;1 set;10 reps;Hand Hold: 2    Lateral Step Up Limitations  7'' step    Forward Step Up  Right;Left;1 set;10 reps;Hand Hold: 1    Forward Step Up Limitations  7'' step    Other Standing Knee Exercises  Tandem on foam 2x30'' each LE forward. Retro ambulation 20 feet x 2 CGA.  NBOS on foam 1# weight to shoulder level and lowered x10.                PT Short Term Goals - 03/11/20 1835      PT SHORT TERM GOAL #1   Title  Patient will report understanding and regular compliance with HEP to improve strength and overall functional mobility.    Time  3    Period  Weeks     Status  New    Target Date  04/01/20        PT Long Term Goals - 03/11/20 1835      PT LONG TERM GOAL #1   Title  Patient will demonstrate ability to perform 5xSTS in 14 seconds or less indicating improved balance and functional strength.    Time  6    Period  Weeks    Status  New    Target Date  04/22/20  PT LONG TERM GOAL #2   Title  Patient will demonstrate ability to ambulate an additional 50 feet on the 2MWT indicating improved gait velocity and safety ambulating in community.    Time  6    Period  Weeks    Target Date  04/22/20      PT LONG TERM GOAL #3   Title  Patient will demonstrate improvement of at least 1/2 MMT strength grade in all deficient muscle groups to improve gait mechanics and safety.    Time  6    Period  Weeks    Status  New    Target Date  04/22/20            Plan - 03/31/20 1118    Clinical Impression Statement  Patient arrived late to the session which limited the session. Added retro walking this session. Patient was looking at her feet for majority of this and required cueing to perform step-through pattern rather than a step-to pattern. She appeared to have poor planning of foot placement intermittently with this, but improved with cueing. Patient would benefit from continued skilled physical therapy to continue progressing towards achieving functional goals.    Personal Factors and Comorbidities  Time since onset of injury/illness/exacerbation;Behavior Pattern;Comorbidity 3+    Comorbidities  HTN, DMII, ESRD, Kidney transplant, GAD, OCD, depression    Examination-Activity Limitations  Stand;Locomotion Level;Transfers;Sit;Sleep;Squat;Stairs    Examination-Participation Restrictions  Cleaning;Meal Prep;Community Activity    Stability/Clinical Decision Making  Evolving/Moderate complexity    Rehab Potential  Fair    PT Frequency  2x / week    PT Duration  6 weeks    PT Treatment/Interventions  ADLs/Self Care Home Management;Aquatic  Therapy;Cryotherapy;Electrical Stimulation;Moist Heat;DME Instruction;Gait training;Stair training;Functional mobility training;Therapeutic activities;Therapeutic exercise;Balance training;Neuromuscular re-education;Patient/family education;Orthotic Fit/Training;Manual techniques;Passive range of motion;Dry needling;Energy conservation;Taping    PT Next Visit Plan  Focus on LE strengthening, functional strengthening, endurance and gait/balance.    PT Home Exercise Plan  03/25: Bridge and 4way hip SLR       Patient will benefit from skilled therapeutic intervention in order to improve the following deficits and impairments:  Abnormal gait, Improper body mechanics, Pain, Decreased activity tolerance, Decreased endurance, Decreased strength, Decreased balance, Difficulty walking  Visit Diagnosis: Muscle weakness (generalized)  Other abnormalities of gait and mobility     Problem List Patient Active Problem List   Diagnosis Date Noted  . UTI (lower urinary tract infection) 11/20/2013  . CKD (chronic kidney disease) stage 3, GFR 30-59 ml/min 08/25/2013  . Perirectal abscess 08/24/2013  . ESRD (end stage renal disease) ([place]) 03/27/2011  . Kidney transplant status, living unrelated donor 03/27/2011  . Anemia 03/27/2011  . Type 2 diabetes mellitus ([place]) 03/27/2011  . Osteoarthritis of right knee 03/27/2011  . Lumbago 03/27/2011  . HTN (hypertension) 03/27/2011  . HLD (hyperlipidemia) 03/27/2011  . Generalized anxiety disorder 03/27/2011  . Depression 03/27/2011  . Chronic steroid use 03/27/2011  . Crohn's disease ([place]) 03/27/2011  . OCD (obsessive compulsive disorder) 03/27/2011  . 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 Obstructive sleep apnea, adult 03/27/2011   [name] PT, DPT 11:19 AM, 03/31/20 [place] [address] [place], [place], [address] Phone: [phone]   Fax:  [phone]  Name: [name] MRN: [id number] Date of Birth: 02-25-1976

## 2020-04-05 NOTE — Therapy (Signed)
[facility], [place], [address] Phone: [phone]   Fax:  [phone]  Speech Language Pathology Evaluation  Patient Details  Name: [name] MRN: [id number] Date of Birth: [birth year] Referring Provider (SLP): Roe Coombs, PA-C   Encounter Date: 03/31/2020    Past Medical History:  Diagnosis Date  . Anxiety    OCD  . Asthma    as a child  . Cancer [facility]) 2001   renal cell , breast cancer both breast 2011, cervical cancer 2013  . Chronic anemia   . Chronic pain   . Complication of anesthesia    hard to sedate dr hung aware  . Crohn's disease ([place])   . Diabetes mellitus without complication ([place])   . Fracture 05/01/2013   Right foot, in cast  . History of blood transfusion 2 years ago  . Hyperlipidemia   . Hypothyroidism    parathroid runs low  . Meningioma ([place])   . Panic disorder   . PONV (postoperative nausea and vomiting)   . Renal disorder    S/p '11-Kidney transplant-Dr. Florene Glen follows  . Urticaria     Past Surgical History:  Procedure Laterality Date  . ABDOMINAL HYSTERECTOMY  02/13/2012   Procedure: HYSTERECTOMY ABDOMINAL;  Surgeon: Cyril Mourning, MD;  Location: [facility];  Service: Gynecology;  Laterality: N/A;  . BRAVO [place] STUDY N/A 11/27/2013   Procedure: BRAVO [place];  Surgeon: Beryle Beams, MD;  [facility];  Service: Endoscopy;  Laterality: N/A;  . BRAVO [place] STUDY N/A 12/11/2013   Procedure: BRAVO [place];  Surgeon: Beryle Beams, MD;  [facility];  Service: Endoscopy;  Laterality: N/A;  . breast lumpectomy and reconstruction Bilateral 2011  . BREAST SURGERY     bilateral lumpectomy 4'11  . COLONOSCOPY N/A 09/25/2013   Procedure: COLONOSCOPY;  Surgeon: Beryle Beams, MD;  [facility];  Service: Endoscopy;  Laterality: N/A;  . COLONOSCOPY WITH PROPOFOL N/A 07/29/2015   Procedure: COLONOSCOPY WITH PROPOFOL;  Surgeon: Carol Ada, MD;   [facility];  Service: Endoscopy;  Laterality: N/A;  . CYSTOSCOPY  02/13/2012   Procedure: CYSTOSCOPY;  Surgeon: Franchot Gallo, MD;  Location: [facility];  Service: Urology;  Laterality: N/A;  insertion of ureteral catheter , removed per Dr Diona Fanti   . ESOPHAGEAL MANOMETRY N/A 08/10/2013   Procedure: ESOPHAGEAL MANOMETRY (EM);  Surgeon: Beryle Beams, MD;  [facility];  Service: Endoscopy;  Laterality: N/A;  . ESOPHAGOGASTRODUODENOSCOPY N/A 09/25/2013   Procedure: ESOPHAGOGASTRODUODENOSCOPY (EGD);  Surgeon: Beryle Beams, MD;  [facility];  Service: Endoscopy;  Laterality: N/A;  . ESOPHAGOGASTRODUODENOSCOPY N/A 11/27/2013   Procedure: ESOPHAGOGASTRODUODENOSCOPY (EGD);  Surgeon: Beryle Beams, MD;  [facility];  Service: Endoscopy;  Laterality: N/A;  . ESOPHAGOGASTRODUODENOSCOPY N/A 12/11/2013   Procedure: ESOPHAGOGASTRODUODENOSCOPY (EGD);  Surgeon: Beryle Beams, MD;  [facility];  Service: Endoscopy;  Laterality: N/A;  . GROIN DISSECTION Left 10/09/2013   Procedure: INCISION AND DRAINAGE OF LEFT GROIN ABSCESS;  Surgeon: Shann Medal, MD;  [facility];  Service: General;  Laterality: Left;  . INCISION AND DRAINAGE ABSCESS N/A 08/26/2013   Procedure: INCISION AND DRAINAGE PERIRECTAL ABSCESS;  Surgeon: Jamesetta So, MD;  [facility];  Service: General;  Laterality: N/A;  . KNEE SURGERY Left april 2003   arthroscopy  . LOWER EXTREMITY ARTERIAL DOPLLERS  05/06/2012   Bilat ABIs were attempted-not accessible due to calcified  veins. Bilat TBIs demonstrated no obtainable flow through both great toes. Bilat PVR ankle regions demonstrated moderately abnormal pulsatile flow. Lft SFA demonstrated a 50-69% diameter reduction. Lft runoff demonstrated occlusive diseasewith reconstitution of flow noted distally.  Marland Kitchen [place] and 2011   last '11-[facility]  . PARATHYROIDECTOMY  02-20-2001  . SLEEVE GASTROPLASTY     for  GERD, hiatal hernia  . TUBAL LIGATION  02-19-2000    There were no vitals filed for this visit.     03/31/20 1204  SLP Visit Information  SLP Received On 03/31/20  Referring Provider (SLP) Roe Coombs, PA-C  Medical Diagnosis s/p COVID-19  Subjective  Subjective "It is getting a little bit better."  Patient/Family Stated Goal Improve cognition  Pain Assessment  Currently in Pain? 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 PMH of ESRD S/P DDKT (1999), RCC S/P nephrectomy, steroid-induced diabetes mellitus, OCD, anxiety disorder, hyperlipidemia, GERD, who was transferred from [facility] to [facility] on 12/08/19 for management of AKI and Covid 19 infection. OSH labs: COVID-19 test positive, Pneumonia progressed during admission and required supplemental O2. Started on remdesivir and Decadron for Covid 19 infection with acute hypoxic respiratory failure secondary to pneumonia. Completed three 3-day course azithromycin 500 mg daily and completed 5-day course IV ceftriaxone for superimposed bacterial pneumonia on COVID-19 viral pneumonia. Nephrology consulted and following for AKI/renal transplant in the setting of acute infection. Myfortic held and prograf decreased. She also had episodes of somnolence and AMS and home pain/sedating meds were held or dose adjusted. Chronic pain team was consulted for pain management. Mentation improved with changes to medications. During admission (12/08/19-12/19/19) Pt with AMS thought to be due to persistent hypoxia, and patient with underlying neuropsychiatric issues. Pt noted stuttering/halting speech after that hospitalization and difficulty with memory and higher level executive functions. 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  Behavioral/Cognition alert and cooperative  Mobility  Status ambulatory  Balance Screen  Has the patient fallen in the past 6 months No  Has the patient had a decrease in activity level because of a fear of falling?  No  Is the patient reluctant to leave their home because of a fear of falling?  No  Prior Functional Status  Cognitive/Linguistic Baseline WFL  Type of Home House   Lives With Spouse  Available Support Family  Education PhD in [place] Full time employment  Cognition  Overall Cognitive Status Impaired/Different from baseline  Area of Impairment Memory;Attention  Current Attention Level Sustained  Memory Decreased short-term memory  Memory Impaired  Memory Impairment Decreased short term memory  Decreased Short Term Memory Verbal complex  Awareness Appears intact  Problem Solving Impaired  Problem Solving Impairment Functional complex  Executive Function Sequencing;Organizing  Sequencing Impaired  Sequencing Impairment Functional complex  Organizing Impaired  Organizing Impairment Functional complex  Auditory Comprehension  Overall Auditory Comprehension Appears within functional limits for tasks assessed  Yes/No Questions WFL  Commands [facility]  Conversation Complex  Interfering Components Attention;Working Investment banker, operational WFL  Reading Comprehension  Reading Status WFL  Expression  Primary Mode of Expression Verbal  Verbal Expression  Overall Verbal Expression Appears within functional limits for tasks assessed  Initiation No impairment  Automatic Speech Name;Social Response;Day of week  Level  of Generative/Spontaneous Verbalization Conversation  Repetition No impairment  Naming No impairment  Pragmatics No impairment  Non-Verbal Means of Communication Not applicable  Oral Motor/Sensory Function  Overall Oral Motor/Sensory Function Appears within functional limits for tasks assessed  Motor Speech  Overall  Motor Speech Appears within functional limits for tasks assessed  Respiration WFL  Phonation Normal  Resonance [facility]  Articulation WFL  Intelligibility Intelligible  Motor Planning [facility]  Motor Speech Errors NA  Phonation WFL  Standardized Assessments  Standardized Assessments   (SLUMS 24/30)       03/31/20 1700  SLP Assessment/Plan  Clinical Impression Statement Pt presents with mild cognitive linguistic deficits characterized by reduced working memory and attention negatively impacting complex auditory comprehension and higher level planning and organization tasks. In addition, she reports occasional stuttering/halthing speech (not noted today), sensitivity to sounds (clock ticking), and difficulty prioritizing activities to be completed in her day. She indicates that her husband reports that she is "mean" and she may say hurtful words that she does not mean. She scored a 24/30 on the SLUMS indicating mild impairment. Pt notes gradual improvement in her cognition and speech, but is not yet back to baseline. Pt will benefit from skilled SLP in order to address the above impairments, maximize independence, and decrease burden of care.  Speech Therapy Frequency 1x /week  Duration  (6 weeks)  Treatment/Interventions Compensatory strategies;Patient/family education;Cueing hierarchy;Functional tasks;Cognitive reorganization;SLP instruction and feedback;Compensatory techniques;Internal/external aids  Potential to Achieve Goals Good  SLP Home Exercise Plan Pt will complete HEP as assigned to facilitate carryover of treatment strategies and techniques in home environment.  Consulted and Agree with Plan of Care Patient    SLP Short Term Goals - 04/05/20 1701      SLP SHORT TERM GOAL #1   Title  Pt will complete moderate-level thought organization and planning activities with 90% acc and min assist.    Baseline  75%    Time  6    Period  Weeks    Status  New    Target Date  05/19/20      SLP  SHORT TERM GOAL #2   Title  Pt will increase recall for requested information to 95% acc during functional memory exercises with use of compensatory strategies as needed when provided min cues.    Baseline  85%    Time  6    Period  Weeks    Status  New    Target Date  05/19/20      SLP SHORT TERM GOAL #3   Title  Pt will plan and execute hypothetical problem solving tasks that involve multitasking with 95% acc with min assist from SLP for strategies.    Baseline  85%    Time  6    Period  Weeks    Status  New    Target Date  05/19/20       SLP Long Term Goals - 04/05/20 1712      SLP LONG TERM GOAL #1   Title  Same as short         Patient will benefit from skilled therapeutic intervention in order to improve the following deficits and impairments:   Cognitive communication deficit    Problem List Patient Active Problem List   Diagnosis Date Noted  . UTI (lower urinary tract infection) 11/20/2013  . CKD (chronic kidney disease) stage 3, GFR 30-59 ml/min 08/25/2013  . Perirectal abscess 08/24/2013  . ESRD (end stage renal disease) ([place]) 03/27/2011  .  Kidney transplant status, living unrelated donor 03/27/2011  . Anemia 03/27/2011  . Type 2 diabetes mellitus ([place]) 03/27/2011  . Osteoarthritis of right knee 03/27/2011  . Lumbago 03/27/2011  . HTN (hypertension) 03/27/2011  . HLD (hyperlipidemia) 03/27/2011  . Generalized anxiety disorder 03/27/2011  . Depression 03/27/2011  . Chronic steroid use 03/27/2011  . Crohn's disease ([place]) 03/27/2011  . OCD (obsessive compulsive disorder) 03/27/2011  . 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 Obstructive sleep apnea, adult 03/27/2011   Thank you,  Genene Churn, [place]  [name] 03/31/2020, 5:13 PM  [facility] [address] [place], [place], [address] Phone: [phone]   Fax:  [phone]  Name: [name] MRN: [id number] Date of  Birth: 1976-12-17

## 2020-04-06 ENCOUNTER — Ambulatory Visit (HOSPITAL_COMMUNITY): Payer: BC Managed Care – PPO | Admitting: Physical Therapy

## 2020-04-06 ENCOUNTER — Telehealth (HOSPITAL_COMMUNITY): Payer: Self-pay | Admitting: Physical Therapy

## 2020-04-06 NOTE — Telephone Encounter (Signed)
s/w the pt regarding changing her appt time since she was scheduled in a slot that was reserved pt could not come in any earlier that day but requested to wait untill next week to get scheduled both for PT and ST.

## 2020-04-06 NOTE — Telephone Encounter (Signed)
pt lmonvm to cx today's visit due to she had to work.

## 2020-04-08 ENCOUNTER — Ambulatory Visit (HOSPITAL_COMMUNITY): Payer: BC Managed Care – PPO | Admitting: Physical Therapy

## 2020-04-12 ENCOUNTER — Ambulatory Visit (HOSPITAL_COMMUNITY): Payer: BC Managed Care – PPO

## 2020-04-12 ENCOUNTER — Telehealth (HOSPITAL_COMMUNITY): Payer: Self-pay

## 2020-04-12 ENCOUNTER — Telehealth (HOSPITAL_COMMUNITY): Payer: Self-pay | Admitting: Speech Pathology

## 2020-04-12 NOTE — Telephone Encounter (Signed)
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

## 2020-04-12 NOTE — Telephone Encounter (Signed)
pt cannot make this appt have a conference to attend

## 2020-04-13 ENCOUNTER — Ambulatory Visit (HOSPITAL_COMMUNITY): Payer: BC Managed Care – PPO | Admitting: Speech Pathology

## 2020-04-13 ENCOUNTER — Ambulatory Visit (HOSPITAL_COMMUNITY): Payer: BC Managed Care – PPO

## 2020-04-14 ENCOUNTER — Encounter (HOSPITAL_COMMUNITY): Payer: Self-pay

## 2020-04-14 ENCOUNTER — Other Ambulatory Visit: Payer: Self-pay

## 2020-04-14 ENCOUNTER — Ambulatory Visit (HOSPITAL_COMMUNITY): Payer: BC Managed Care – PPO

## 2020-04-14 DIAGNOSIS — M6281 Muscle weakness (generalized): Secondary | ICD-10-CM | POA: Diagnosis not present

## 2020-04-14 DIAGNOSIS — R2689 Other abnormalities of gait and mobility: Secondary | ICD-10-CM

## 2020-04-14 NOTE — Therapy (Signed)
[facility], [place], [address] Phone: [phone]   Fax:  [phone]  Physical Therapy Treatment  Patient Details  Name: [name] MRN: [id number] Date of Birth: 18-Oct-1976 Referring Provider (PT): Roe Coombs PA   Encounter Date: 04/14/2020  PT End of Session - 04/14/20 1714    Visit Number  5    Number of Visits  12    Date for PT Re-Evaluation  04/22/20    Authorization Type  Primary: BCBS PPO; Secondary: [employer] Medicare    Authorization Time Period  12 visits approved: 03/11/20 - 04/22/20    Authorization - Visit Number  5    Authorization - Number of Visits  12    Progress Note Due on Visit  10    PT Start Time  1710   late for apt   PT Stop Time  1748    PT Time Calculation (min)  38 min    Equipment Utilized During Treatment  Gait belt    Activity Tolerance  Patient tolerated treatment well;No increased pain;Patient limited by fatigue    Behavior During Therapy  [facility] for tasks assessed/performed       Past Medical History:  Diagnosis Date  . Anxiety    OCD  . Asthma    as a child  . Cancer [facility]) 2001   renal cell , breast cancer both breast 2011, cervical cancer 2013  . Chronic anemia   . Chronic pain   . Complication of anesthesia    hard to sedate dr hung aware  . Crohn's disease ([place])   . Diabetes mellitus without complication ([place])   . Fracture 05/01/2013   Right foot, in cast  . History of blood transfusion 2 years ago  . Hyperlipidemia   . Hypothyroidism    parathroid runs low  . Meningioma ([place])   . Panic disorder   . PONV (postoperative nausea and vomiting)   . Renal disorder    S/p '11-Kidney transplant-Dr. Florene Glen follows  . Urticaria     Past Surgical History:  Procedure Laterality Date  . ABDOMINAL HYSTERECTOMY  02/13/2012   Procedure: HYSTERECTOMY ABDOMINAL;  Surgeon: Cyril Mourning, MD;  Location: [facility];  Service: Gynecology;  Laterality: N/A;  . BRAVO  [place] STUDY N/A 11/27/2013   Procedure: BRAVO [place];  Surgeon: Beryle Beams, MD;  [facility];  Service: Endoscopy;  Laterality: N/A;  . BRAVO [place] STUDY N/A 12/11/2013   Procedure: BRAVO [place];  Surgeon: Beryle Beams, MD;  [facility];  Service: Endoscopy;  Laterality: N/A;  . breast lumpectomy and reconstruction Bilateral 2011  . BREAST SURGERY     bilateral lumpectomy 4'11  . COLONOSCOPY N/A 09/25/2013   Procedure: COLONOSCOPY;  Surgeon: Beryle Beams, MD;  [facility];  Service: Endoscopy;  Laterality: N/A;  . COLONOSCOPY WITH PROPOFOL N/A 07/29/2015   Procedure: COLONOSCOPY WITH PROPOFOL;  Surgeon: Carol Ada, MD;  [facility];  Service: Endoscopy;  Laterality: N/A;  . CYSTOSCOPY  02/13/2012   Procedure: CYSTOSCOPY;  Surgeon: Franchot Gallo, MD;  Location: [facility];  Service: Urology;  Laterality: N/A;  insertion of ureteral catheter , removed per Dr Diona Fanti   . ESOPHAGEAL MANOMETRY N/A 08/10/2013   Procedure: ESOPHAGEAL MANOMETRY (EM);  Surgeon: Beryle Beams, MD;  [facility];  Service: Endoscopy;  Laterality: N/A;  . ESOPHAGOGASTRODUODENOSCOPY N/A 09/25/2013   Procedure: ESOPHAGOGASTRODUODENOSCOPY (EGD);  Surgeon: Beryle Beams, MD;  [facility];  Service: Endoscopy;  Laterality: N/A;  . ESOPHAGOGASTRODUODENOSCOPY N/A 11/27/2013   Procedure: ESOPHAGOGASTRODUODENOSCOPY (EGD);  Surgeon: Beryle Beams, MD;  [facility];  Service: Endoscopy;  Laterality: N/A;  . ESOPHAGOGASTRODUODENOSCOPY N/A 12/11/2013   Procedure: ESOPHAGOGASTRODUODENOSCOPY (EGD);  Surgeon: Beryle Beams, MD;  [facility];  Service: Endoscopy;  Laterality: N/A;  . GROIN DISSECTION Left 10/09/2013   Procedure: INCISION AND DRAINAGE OF LEFT GROIN ABSCESS;  Surgeon: Shann Medal, MD;  [facility];  Service: General;  Laterality: Left;  . INCISION AND DRAINAGE ABSCESS N/A 08/26/2013   Procedure: INCISION AND DRAINAGE  PERIRECTAL ABSCESS;  Surgeon: Jamesetta So, MD;  [facility]: [facility];  Service: General;  Laterality: N/A;  . KNEE SURGERY Left april 2003   arthroscopy  . LOWER EXTREMITY ARTERIAL DOPLLERS  05/06/2012   Bilat ABIs were attempted-not accessible due to calcified veins. Bilat TBIs demonstrated no obtainable flow through both great toes. Bilat PVR ankle regions demonstrated moderately abnormal pulsatile flow. Lft SFA demonstrated a 50-69% diameter reduction. Lft runoff demonstrated occlusive diseasewith reconstitution of flow noted distally.  Marland Kitchen [place] and 2011   last '11-[facility]  . PARATHYROIDECTOMY  02-20-2001  . SLEEVE GASTROPLASTY     for GERD, hiatal hernia  . TUBAL LIGATION  02-19-2000    There were no vitals filed for this visit.  Subjective Assessment - 04/14/20 1712    Subjective  Pt reports she has increased Lt knee pain today, pain scale 7-8/10    Pertinent History  History of Covid-19 with multiple hospital admissions    Patient Stated Goals  To have better balance and be able to use her legs consistently    Currently in Pain?  Yes    Pain Score  8     Pain Location  Knee    Pain Orientation  Left    Pain Descriptors / Indicators  Aching    Pain Type  Chronic pain    Pain Onset  More than a month ago    Pain Frequency  Constant    Aggravating Factors   unsure    Pain Relieving Factors  unsure, its constant    Effect of Pain on Daily Activities  limits                       [facility] Adult PT Treatment/Exercise - 04/14/20 0001      Exercises   Exercises  Knee/Hip      Knee/Hip Exercises: Standing   Heel Raises  Both;20 reps    Heel Raises Limitations  toe raises x20    Functional Squat  5 reps    Functional Squat Limitations  front of chair for mechanics    Stairs  5RT step to pattern then reciprocal x 2 RT    Rocker Board  2 minutes    Rocker Board Limitations  lateral    Other Standing Knee Exercises  tandem and retro gait  2RT    Other Standing Knee Exercises  Tandem stance on foam 1x 30" then 5 head turns each position      Knee/Hip Exercises: Seated   Sit to Sand  10 reps;without UE support               PT Short Term Goals - 03/11/20 1835      PT SHORT TERM GOAL #1   Title  Patient will report understanding and regular compliance with HEP to improve  strength and overall functional mobility.    Time  3    Period  Weeks    Status  New    Target Date  04/01/20        PT Long Term Goals - 03/11/20 1835      PT LONG TERM GOAL #1   Title  Patient will demonstrate ability to perform 5xSTS in 14 seconds or less indicating improved balance and functional strength.    Time  6    Period  Weeks    Status  New    Target Date  04/22/20      PT LONG TERM GOAL #2   Title  Patient will demonstrate ability to ambulate an additional 50 feet on the 2MWT indicating improved gait velocity and safety ambulating in community.    Time  6    Period  Weeks    Target Date  04/22/20      PT LONG TERM GOAL #3   Title  Patient will demonstrate improvement of at least 1/2 MMT strength grade in all deficient muscle groups to improve gait mechanics and safety.    Time  6    Period  Weeks    Status  New    Target Date  04/22/20            Plan - 04/14/20 1746    Clinical Impression Statement  Progressed functional strengthening and balance training this session.  Pt improved static stability on dynamic surface, increased challenge wiht head turns and arm movements with min A for safety.  Pt presents with improved foot placement with gait mechanics close to WNL this session.  Began reciprocal stair pattent wiht increased difficutly and cueing for appropriate sequence.  Also added squats for gluteal strenghtenig to assist with sit to stands.  No reports of increased pain, was limited by [place] with activities.    Personal Factors and Comorbidities  Time since onset of injury/illness/exacerbation;Behavior  Pattern;Comorbidity 3+    Comorbidities  HTN, DMII, ESRD, Kidney transplant, GAD, OCD, depression    Examination-Activity Limitations  Stand;Locomotion Level;Transfers;Sit;Sleep;Squat;Stairs    Examination-Participation Restrictions  Cleaning;Meal Prep;Community Activity    Stability/Clinical Decision Making  Evolving/Moderate complexity    Clinical Decision Making  Moderate    Rehab Potential  Fair    PT Frequency  2x / week    PT Duration  6 weeks    PT Treatment/Interventions  ADLs/Self Care Home Management;Aquatic Therapy;Cryotherapy;Electrical Stimulation;Moist Heat;DME Instruction;Gait training;Stair training;Functional mobility training;Therapeutic activities;Therapeutic exercise;Balance training;Neuromuscular re-education;Patient/family education;Orthotic Fit/Training;Manual techniques;Passive range of motion;Dry needling;Energy conservation;Taping    PT Next Visit Plan  Focus on LE strengthening, functional strengthening, endurance and gait/balance.  Progress HEP wiht additional balance activities next session.    PT Home Exercise Plan  03/25: Bridge and 4way hip SLR       Patient will benefit from skilled therapeutic intervention in order to improve the following deficits and impairments:  Abnormal gait, Improper body mechanics, Pain, Decreased activity tolerance, Decreased endurance, Decreased strength, Decreased balance, Difficulty walking  Visit Diagnosis: Muscle weakness (generalized)  Other abnormalities of gait and mobility     Problem List Patient Active Problem List   Diagnosis Date Noted  . UTI (lower urinary tract infection) 11/20/2013  . CKD (chronic kidney disease) stage 3, GFR 30-59 ml/min 08/25/2013  . Perirectal abscess 08/24/2013  . ESRD (end stage renal disease) ([place]) 03/27/2011  . Kidney transplant status, living unrelated donor 03/27/2011  . Anemia 03/27/2011  . Type 2 diabetes mellitus ([place])  03/27/2011  . Osteoarthritis of right knee 03/27/2011  .  Lumbago 03/27/2011  . HTN (hypertension) 03/27/2011  . HLD (hyperlipidemia) 03/27/2011  . Generalized anxiety disorder 03/27/2011  . Depression 03/27/2011  . Chronic steroid use 03/27/2011  . Crohn's disease ([place]) 03/27/2011  . OCD (obsessive compulsive disorder) 03/27/2011  . 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 Obstructive sleep apnea, adult 03/27/2011   Ihor Austin, LPTA/CLT; CBIS [phone]  Aldona Lento 04/14/2020, 6:10 PM  [facility] [address] [place], [place], [address] Phone: [phone]   Fax:  [phone]  Name: Gabrille Kilbride MRN: [id number] Date of Birth: Jan 31, 1976

## 2020-04-19 ENCOUNTER — Ambulatory Visit (HOSPITAL_COMMUNITY): Payer: BC Managed Care – PPO | Admitting: Speech Pathology

## 2020-04-19 ENCOUNTER — Telehealth (HOSPITAL_COMMUNITY): Payer: Self-pay

## 2020-04-19 ENCOUNTER — Other Ambulatory Visit: Payer: Self-pay

## 2020-04-19 ENCOUNTER — Encounter (HOSPITAL_COMMUNITY): Payer: Self-pay | Admitting: Speech Pathology

## 2020-04-19 ENCOUNTER — Ambulatory Visit (HOSPITAL_COMMUNITY): Payer: BC Managed Care – PPO

## 2020-04-19 DIAGNOSIS — M6281 Muscle weakness (generalized): Secondary | ICD-10-CM | POA: Diagnosis not present

## 2020-04-19 DIAGNOSIS — R41841 Cognitive communication deficit: Secondary | ICD-10-CM

## 2020-04-19 NOTE — Telephone Encounter (Signed)
the pt wanted to cancel all of the physical therapy appts and just do speech therapy for now due to her work schedule. 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

## 2020-04-19 NOTE — Therapy (Signed)
[place], [place], [address] Phone: [phone]   Fax:  [phone]  Speech Language Pathology Treatment  Patient Details  Name: [name] MRN: [id number] Date of Birth: Feb 23, 1976 Referring Provider (SLP): Roe Coombs, PA-C   Encounter Date: 04/19/2020  End of Session - 04/19/20 1515    Visit Number  2    Number of Visits  7    Authorization Type  [employer] PPO; secondary is [employer]    SLP Start Time  4259    SLP Stop Time   1458    SLP Time Calculation (min)  59 min    Activity Tolerance  Patient tolerated treatment well       Past Medical History:  Diagnosis Date  . Anxiety    OCD  . Asthma    as a child  . Cancer [facility]) 2001   renal cell , breast cancer both breast 2011, cervical cancer 2013  . Chronic anemia   . Chronic pain   . Complication of anesthesia    hard to sedate dr hung aware  . Crohn's disease ([place])   . Diabetes mellitus without complication ([place])   . Fracture 05/01/2013   Right foot, in cast  . History of blood transfusion 2 years ago  . Hyperlipidemia   . Hypothyroidism    parathroid runs low  . Meningioma ([place])   . Panic disorder   . PONV (postoperative nausea and vomiting)   . Renal disorder    S/p '11-Kidney transplant-Dr. Florene Glen follows  . Urticaria     Past Surgical History:  Procedure Laterality Date  . ABDOMINAL HYSTERECTOMY  02/13/2012   Procedure: HYSTERECTOMY ABDOMINAL;  Surgeon: Cyril Mourning, MD;  Location: [facility];  Service: Gynecology;  Laterality: N/A;  . BRAVO [place] STUDY N/A 11/27/2013   Procedure: BRAVO [place];  Surgeon: Beryle Beams, MD;  [facility];  Service: Endoscopy;  Laterality: N/A;  . BRAVO [place] STUDY N/A 12/11/2013   Procedure: BRAVO [place];  Surgeon: Beryle Beams, MD;  [facility];  Service: Endoscopy;  Laterality: N/A;  . breast lumpectomy and reconstruction Bilateral 2011  . BREAST SURGERY      bilateral lumpectomy 4'11  . COLONOSCOPY N/A 09/25/2013   Procedure: COLONOSCOPY;  Surgeon: Beryle Beams, MD;  [facility];  Service: Endoscopy;  Laterality: N/A;  . COLONOSCOPY WITH PROPOFOL N/A 07/29/2015   Procedure: COLONOSCOPY WITH PROPOFOL;  Surgeon: Carol Ada, MD;  [facility];  Service: Endoscopy;  Laterality: N/A;  . CYSTOSCOPY  02/13/2012   Procedure: CYSTOSCOPY;  Surgeon: Franchot Gallo, MD;  Location: [facility];  Service: Urology;  Laterality: N/A;  insertion of ureteral catheter , removed per Dr Diona Fanti   . ESOPHAGEAL MANOMETRY N/A 08/10/2013   Procedure: ESOPHAGEAL MANOMETRY (EM);  Surgeon: Beryle Beams, MD;  [facility];  Service: Endoscopy;  Laterality: N/A;  . ESOPHAGOGASTRODUODENOSCOPY N/A 09/25/2013   Procedure: ESOPHAGOGASTRODUODENOSCOPY (EGD);  Surgeon: Beryle Beams, MD;  Location: [facility];  Service: Endoscopy;  Laterality: N/A;  . ESOPHAGOGASTRODUODENOSCOPY N/A 11/27/2013   Procedure: ESOPHAGOGASTRODUODENOSCOPY (EGD);  Surgeon: Beryle Beams, MD;  Location: [facility];  Service: Endoscopy;  Laterality: N/A;  . ESOPHAGOGASTRODUODENOSCOPY N/A 12/11/2013   Procedure: ESOPHAGOGASTRODUODENOSCOPY (EGD);  Surgeon: Beryle Beams, MD;  Location: [facility];  Service: Endoscopy;  Laterality: N/A;  . GROIN DISSECTION Left 10/09/2013   Procedure: INCISION AND DRAINAGE OF LEFT GROIN ABSCESS;  Surgeon: Shann Medal, MD;  [facility];  Service: General;  Laterality: Left;  . INCISION AND DRAINAGE ABSCESS N/A 08/26/2013   Procedure: INCISION AND DRAINAGE PERIRECTAL ABSCESS;  Surgeon: Jamesetta So, MD;  [facility];  Service: General;  Laterality: N/A;  . KNEE SURGERY Left april 2003   arthroscopy  . LOWER EXTREMITY ARTERIAL DOPLLERS  05/06/2012   Bilat ABIs were attempted-not accessible due to calcified veins. Bilat TBIs demonstrated no obtainable flow through both great toes. Bilat PVR ankle regions demonstrated moderately  abnormal pulsatile flow. Lft SFA demonstrated a 50-69% diameter reduction. Lft runoff demonstrated occlusive diseasewith reconstitution of flow noted distally.  Marland [place] and 2011   last '11-[facility]  . PARATHYROIDECTOMY  02-20-2001  . SLEEVE GASTROPLASTY     for GERD, hiatal hernia  . TUBAL LIGATION  02-19-2000    There were no vitals filed for this visit.  Subjective Assessment - 04/19/20 1457    Subjective  "I don't know how to get organized."    Currently in Pain?  No/denies       ADULT SLP TREATMENT - 04/19/20 1510      General Information   Behavior/Cognition  Alert;Cooperative;Pleasant mood    Patient Positioning  Upright in chair    Oral care provided  N/A    HPI  [name] is a 44 yo female who was referred by Roe Coombs, PA-C for SLE s/p COVID-19 infection with resulting "brain fog". PMH of ESRD S/P DDKT (1999), RCC S/P nephrectomy, steroid-induced diabetes mellitus, OCD, anxiety disorder, hyperlipidemia, GERD, who was transferred from [facility] to [facility] on 12/08/19 for management of AKI and Covid 19 infection. OSH labs: COVID-19 test positive, Pneumonia progressed during admission and required supplemental O2. Started on remdesivir and Decadron for Covid 19 infection with acute hypoxic respiratory failure secondary to pneumonia. Completed three 3-day course azithromycin 500 mg daily and completed 5-day course IV ceftriaxone for superimposed bacterial pneumonia on COVID-19 viral pneumonia. Nephrology consulted and following for AKI/renal transplant in the setting of acute infection. Myfortic held and prograf decreased. She also had episodes of somnolence and AMS and home pain/sedating meds were held or dose adjusted. Chronic pain team was consulted for pain management. Mentation improved with changes to medications. During admission (12/08/19-12/19/19) Pt with AMS thought to be due to persistent hypoxia, and patient with  underlying neuropsychiatric issues. Pt noted stuttering/halting speech after that hospitalization and difficulty with memory and higher level executive functions. 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      Treatment Provided   Treatment provided  Cognitive-Linquistic      Pain Assessment   Pain Assessment  No/denies pain      Cognitive-Linquistic Treatment   Treatment focused on  Cognition;Patient/family/caregiver education    Skilled Treatment  attention, planning, organization, and memory strategies related to functional tasks      Assessment / Recommendations / Plan   Plan  Continue with current plan of care      Progression Toward Goals   Progression toward goals  Progressing toward goals       SLP Education - 04/19/20 1514    Education Details  Provided list of memory strategies, attention/planning self assessment    Person(s) Educated  Patient    Methods  Explanation;Handout    Comprehension  Verbalized understanding       SLP Short Term Goals - 04/19/20 2536  SLP SHORT TERM GOAL #1   Title  Pt will complete moderate-level thought organization and planning activities with 90% acc and min assist.    Baseline  75%    Time  6    Period  Weeks    Status  On-going    Target Date  05/19/20      SLP SHORT TERM GOAL #2   Title  Pt will increase recall for requested information to 95% acc during functional memory exercises with use of compensatory strategies as needed when provided min cues.    Baseline  85%    Time  6    Period  Weeks    Status  On-going    Target Date  05/19/20      SLP SHORT TERM GOAL #3   Title  Pt will plan and execute hypothetical problem solving tasks that involve multitasking with 95% acc with min assist from SLP for strategies.    Baseline  85%    Time  6    Period  Weeks    Status  On-going    Target Date  05/19/20       SLP Long Term Goals - 04/19/20 1516      SLP LONG TERM GOAL #1   Title   Same as short       Plan - 04/19/20 1516    Clinical Impression Statement  Pt presents with mild cognitive linguistic deficits characterized by reduced working memory and attention negatively impacting complex auditory comprehension and higher level planning and organization tasks. She reports that the moments of halting speech are very infrequent now. She described situations at home/work where she noted difficulties. She appears to be having difficulty organizing, prioritizing, initiating, and completing tasks. She was asked to use her planner by recording major appointments on the monthly page and smaller details on the daily page (to go hour by hour for planning and completion). She was given a written list to follow which included "to do"/action plans to record all bill payees in the notes section of her iphone according to date due, use index cards to write single steps for completion (ie. For laundry), and to record "problem areas" at home to discuss next session. She was also given a list of memory and attention strategies to read through at home. In session, she completed a self evaluation regarding changes in her attention, memory, and planning skills. Next session, continue to complete hypothetical functional problem solving tasks with action plan.    Speech Therapy Frequency  1x /week    Duration  --   6 weeks   Treatment/Interventions  Compensatory strategies;Patient/family education;Cueing hierarchy;Functional tasks;Cognitive reorganization;SLP instruction and feedback;Compensatory techniques;Internal/external aids    Potential to Achieve Goals  Good    SLP Home Exercise Plan  Pt will complete HEP as assigned to facilitate carryover of treatment strategies and techniques in home environment.    Consulted and Agree with Plan of Care  Patient       Patient will benefit from skilled therapeutic intervention in order to improve the following deficits and impairments:   Cognitive  communication deficit    Problem List Patient Active Problem List   Diagnosis Date Noted  . UTI (lower urinary tract infection) 11/20/2013  . CKD (chronic kidney disease) stage 3, GFR 30-59 ml/min 08/25/2013  . Perirectal abscess 08/24/2013  . ESRD (end stage renal disease) ([place]) 03/27/2011  . Kidney transplant status, living unrelated donor 03/27/2011  . Anemia 03/27/2011  . Type 2  diabetes mellitus ([place]) 03/27/2011  . Osteoarthritis of right knee 03/27/2011  . Lumbago 03/27/2011  . HTN (hypertension) 03/27/2011  . HLD (hyperlipidemia) 03/27/2011  . Generalized anxiety disorder 03/27/2011  . Depression 03/27/2011  . Chronic steroid use 03/27/2011  . Crohn's disease ([place]) 03/27/2011  . OCD (obsessive compulsive disorder) 03/27/2011  . 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 Obstructive sleep apnea, adult 03/27/2011   Thank you,  Genene Churn, [place]  [facility] 04/19/2020, 3:17 PM  [facility] [address] [place], [place], [address] Phone: [phone]   Fax:  [phone]   Name: [name] MRN: [id number] Date of Birth: 23-Jun-1976

## 2020-04-20 ENCOUNTER — Ambulatory Visit: Payer: BC Managed Care – PPO

## 2020-04-21 ENCOUNTER — Ambulatory Visit (HOSPITAL_COMMUNITY): Payer: BC Managed Care – PPO | Admitting: Physical Therapy

## 2020-04-22 ENCOUNTER — Ambulatory Visit: Payer: BC Managed Care – PPO

## 2020-04-26 ENCOUNTER — Encounter (HOSPITAL_COMMUNITY): Payer: Self-pay | Admitting: Physical Therapy

## 2020-04-26 ENCOUNTER — Ambulatory Visit (HOSPITAL_COMMUNITY): Payer: BC Managed Care – PPO | Admitting: Speech Pathology

## 2020-04-26 ENCOUNTER — Telehealth (HOSPITAL_COMMUNITY): Payer: Self-pay | Admitting: Speech Pathology

## 2020-04-26 NOTE — Telephone Encounter (Signed)
pt cancelled appt for today because she had her second COVID shot and is not feeling well

## 2020-04-26 NOTE — Therapy (Signed)
[place], [place], [address] Phone: [phone]   Fax:  [phone]  Patient Details  Name: [name] MRN: [id number] Date of Birth: 1976/03/12 Referring Provider:  No ref. provider found  Encounter Date: 04/26/2020  PHYSICAL THERAPY DISCHARGE SUMMARY  Visits from Start of Care: 5  Current functional level related to goals / functional outcomes: Unknown as patient did not return for re-assessment.    Remaining deficits: Unknown as patient did not return for re-assessment.    Education / Equipment: HEP Plan: Patient agrees to discharge.  Patient goals were not met. Patient is being discharged due to the patient's request.  ?????      Clarene Critchley PT, DPT 10:17 AM, 04/26/20 [place], [place], [address] Phone: [phone]   Fax:  [phone]

## 2020-05-03 ENCOUNTER — Ambulatory Visit (HOSPITAL_COMMUNITY): Payer: BC Managed Care – PPO | Attending: Physician Assistant | Admitting: Speech Pathology

## 2020-05-03 DIAGNOSIS — R41841 Cognitive communication deficit: Secondary | ICD-10-CM | POA: Insufficient documentation

## 2020-05-10 ENCOUNTER — Other Ambulatory Visit: Payer: Self-pay

## 2020-05-10 ENCOUNTER — Encounter (HOSPITAL_COMMUNITY): Payer: Self-pay | Admitting: Speech Pathology

## 2020-05-10 ENCOUNTER — Ambulatory Visit (HOSPITAL_COMMUNITY): Payer: BC Managed Care – PPO | Admitting: Speech Pathology

## 2020-05-10 DIAGNOSIS — R41841 Cognitive communication deficit: Secondary | ICD-10-CM

## 2020-05-10 NOTE — Therapy (Addendum)
[place], [place], [address] Phone: [phone]   Fax:  [phone]  Speech Language Pathology Treatment  Patient Details  Name: [name] MRN: [id number] Date of Birth: 1976/05/05 Referring Provider (SLP): Roe Coombs, PA-C   Encounter Date: 05/10/2020  End of Session - 05/10/20 1729    Visit Number  3    Number of Visits  7    Authorization Type  [employer] PPO; secondary is [employer] Medicare    SLP Start Time  1540    SLP Stop Time   1630    SLP Time Calculation (min)  50 min    Activity Tolerance  Patient tolerated treatment well       Past Medical History:  Diagnosis Date  . Anxiety    OCD  . Asthma    as a child  . Cancer [facility]) 2001   renal cell , breast cancer both breast 2011, cervical cancer 2013  . Chronic anemia   . Chronic pain   . Complication of anesthesia    hard to sedate dr hung aware  . Crohn's disease ([place])   . Diabetes mellitus without complication ([place])   . Fracture 05/01/2013   Right foot, in cast  . History of blood transfusion 2 years ago  . Hyperlipidemia   . Hypothyroidism    parathroid runs low  . Meningioma ([place])   . Panic disorder   . PONV (postoperative nausea and vomiting)   . Renal disorder    S/p '11-Kidney transplant-Dr. Florene Glen follows  . Urticaria     Past Surgical History:  Procedure Laterality Date  . ABDOMINAL HYSTERECTOMY  02/13/2012   Procedure: HYSTERECTOMY ABDOMINAL;  Surgeon: Cyril Mourning, MD;  Location: [facility];  Service: Gynecology;  Laterality: N/A;  . BRAVO [place] STUDY N/A 11/27/2013   Procedure: BRAVO [place];  Surgeon: Beryle Beams, MD;  [facility];  Service: Endoscopy;  Laterality: N/A;  . BRAVO [place] STUDY N/A 12/11/2013   Procedure: BRAVO [place];  Surgeon: Beryle Beams, MD;  [facility];  Service: Endoscopy;  Laterality: N/A;  . breast lumpectomy and reconstruction Bilateral 2011  . BREAST SURGERY      bilateral lumpectomy 4'11  . COLONOSCOPY N/A 09/25/2013   Procedure: COLONOSCOPY;  Surgeon: Beryle Beams, MD;  [facility];  Service: Endoscopy;  Laterality: N/A;  . COLONOSCOPY WITH PROPOFOL N/A 07/29/2015   Procedure: COLONOSCOPY WITH PROPOFOL;  Surgeon: Carol Ada, MD;  [facility];  Service: Endoscopy;  Laterality: N/A;  . CYSTOSCOPY  02/13/2012   Procedure: CYSTOSCOPY;  Surgeon: Franchot Gallo, MD;  Location: [facility];  Service: Urology;  Laterality: N/A;  insertion of ureteral catheter , removed per Dr Diona Fanti   . ESOPHAGEAL MANOMETRY N/A 08/10/2013   Procedure: ESOPHAGEAL MANOMETRY (EM);  Surgeon: Beryle Beams, MD;  [facility];  Service: Endoscopy;  Laterality: N/A;  . ESOPHAGOGASTRODUODENOSCOPY N/A 09/25/2013   Procedure: ESOPHAGOGASTRODUODENOSCOPY (EGD);  Surgeon: Beryle Beams, MD;  Location: [facility];  Service: Endoscopy;  Laterality: N/A;  . ESOPHAGOGASTRODUODENOSCOPY N/A 11/27/2013   Procedure: ESOPHAGOGASTRODUODENOSCOPY (EGD);  Surgeon: Beryle Beams, MD;  Location: [facility];  Service: Endoscopy;  Laterality: N/A;  . ESOPHAGOGASTRODUODENOSCOPY N/A 12/11/2013   Procedure: ESOPHAGOGASTRODUODENOSCOPY (EGD);  Surgeon: Beryle Beams, MD;  Location: [facility];  Service: Endoscopy;  Laterality: N/A;  . GROIN DISSECTION Left 10/09/2013   Procedure: INCISION AND DRAINAGE OF LEFT GROIN ABSCESS;  Surgeon: Shann Medal, MD;  [facility];  Service: General;  Laterality: Left;  . INCISION AND DRAINAGE ABSCESS N/A 08/26/2013   Procedure: INCISION AND DRAINAGE PERIRECTAL ABSCESS;  Surgeon: Jamesetta So, MD;  Location: [facility];  Service: General;  Laterality: N/A;  . KNEE SURGERY Left april 2003   arthroscopy  . LOWER EXTREMITY ARTERIAL DOPLLERS  05/06/2012   Bilat ABIs were attempted-not accessible due to calcified veins. Bilat TBIs demonstrated no obtainable flow through both great toes. Bilat PVR ankle regions demonstrated moderately  abnormal pulsatile flow. Lft SFA demonstrated a 50-69% diameter reduction. Lft runoff demonstrated occlusive diseasewith reconstitution of flow noted distally.  Marland Kitchen [place] and 2011   last '11-[facility]  . PARATHYROIDECTOMY  02-20-2001  . SLEEVE GASTROPLASTY     for GERD, hiatal hernia  . TUBAL LIGATION  02-19-2000    There were no vitals filed for this visit.  Subjective Assessment - 05/10/20 1633    Subjective  "I added color coded sticky notes to my calendar."    Currently in Pain?  No/denies        ADULT SLP TREATMENT - 05/10/20 1719      General Information   Behavior/Cognition  Alert;Cooperative;Pleasant mood    Patient Positioning  Upright in chair    Oral care provided  N/A    HPI  [name] is a 44 yo female who was referred by Roe Coombs, PA-C for SLE s/p COVID-19 infection with resulting "brain fog". PMH of ESRD S/P DDKT (1999), RCC S/P nephrectomy, steroid-induced diabetes mellitus, OCD, anxiety disorder, hyperlipidemia, GERD, who was transferred from [facility] to [facility] on 12/08/19 for management of AKI and Covid 19 infection. OSH labs: COVID-19 test positive, Pneumonia progressed during admission and required supplemental O2. Started on remdesivir and Decadron for Covid 19 infection with acute hypoxic respiratory failure secondary to pneumonia. Completed three 3-day course azithromycin 500 mg daily and completed 5-day course IV ceftriaxone for superimposed bacterial pneumonia on COVID-19 viral pneumonia. Nephrology consulted and following for AKI/renal transplant in the setting of acute infection. Myfortic held and prograf decreased. She also had episodes of somnolence and AMS and home pain/sedating meds were held or dose adjusted. Chronic pain team was consulted for pain management. Mentation improved with changes to medications. During admission (12/08/19-12/19/19) Pt with AMS thought to be due to persistent hypoxia, and  patient with underlying neuropsychiatric issues. Pt noted stuttering/halting speech after that hospitalization and difficulty with memory and higher level executive functions. 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      Treatment Provided   Treatment provided  Cognitive-Linquistic      Pain Assessment   Pain Assessment  No/denies pain      Cognitive-Linquistic Treatment   Treatment focused on  Cognition;Patient/family/caregiver education    Skilled Treatment  attention, planning, organization, and memory strategies related to functional tasks      Assessment / Recommendations / Plan   Plan  Continue with current plan of care      Progression Toward Goals   Progression toward goals  Progressing toward goals       SLP Education - 05/10/20 1728    Education Details  Provided attention and problem solving cue card on index card    Person(s) Educated  Patient    Methods  Explanation    Comprehension  Verbalized understanding       SLP Short Term Goals -  05/10/20 1730      SLP SHORT TERM GOAL #1   Title  Pt will complete moderate-level thought organization and planning activities with 90% acc and min assist.    Baseline  75%    Time  6    Period  Weeks    Status  On-going    Target Date  05/19/20      SLP SHORT TERM GOAL #2   Title  Pt will increase recall for requested information to 95% acc during functional memory exercises with use of compensatory strategies as needed when provided min cues.    Baseline  85%    Time  6    Period  Weeks    Status  On-going    Target Date  05/19/20      SLP SHORT TERM GOAL #3   Title  Pt will plan and execute hypothetical problem solving tasks that involve multitasking with 95% acc with min assist from SLP for strategies.    Baseline  85%    Time  6    Period  Weeks    Status  On-going    Target Date  05/19/20       SLP Long Term Goals - 05/10/20 1730      SLP LONG TERM GOAL #1   Title   Same as short       Plan - 05/10/20 1729    Clinical Impression Statement  Pt continues to present with mild cognitive linguistic deficits with difficulty in planning, organizing, and initiating tasks. She is no longer exhibiting halting speech. She missed our session last week because she thought the appointment had been changed (she listened to a message on her phone from a few weeks ago). Pt completed HEP, although not with complete accuracy. For example, she was asked to create list of bill payees and list of passwords and place in the "notes" section of her iPhone and she did create a list, but was unable to locate where she made them (she has several notebooks). She did add all of her appointments into her planner and color coded the added Post-it notes according to category. She is using the monthly view as well as the daily view. She brought in several notebooks where she has been recording information, however she seems to be adding information haphazardly. She was reminded to place information in a serial fashion and to add the date anytime she records information. She was given an index card with reminders for attention and problem solving approaches. Together, we created a list of "what is not working" items and brainstormed potential solutions. She is to add to this for homework.  Her problem solving and attention strategies include: Stop and Think before starting an activity Read directions twice Ask for clarification Repeat instructions to be sure understood Check off tasks in the calendar when completed Recheck work when finished Take notes to remember specific things    Speech Therapy Frequency  1x /week    Duration  --   6 weeks   Treatment/Interventions  Compensatory strategies;Patient/family education;Cueing hierarchy;Functional tasks;Cognitive reorganization;SLP instruction and feedback;Compensatory techniques;Internal/external aids    Potential to Achieve Goals  Good    SLP  Home Exercise Plan  Pt will complete HEP as assigned to facilitate carryover of treatment strategies and techniques in home environment.    Consulted and Agree with Plan of Care  Patient       Patient will benefit from skilled therapeutic intervention in order to improve the  following deficits and impairments:   Cognitive communication deficit    Problem List Patient Active Problem List   Diagnosis Date Noted  . UTI (lower urinary tract infection) 11/20/2013  . CKD (chronic kidney disease) stage 3, GFR 30-59 ml/min 08/25/2013  . Perirectal abscess 08/24/2013  . ESRD (end stage renal disease) ([place]) 03/27/2011  . Kidney transplant status, living unrelated donor 03/27/2011  . Anemia 03/27/2011  . Type 2 diabetes mellitus ([place]) 03/27/2011  . Osteoarthritis of right knee 03/27/2011  . Lumbago 03/27/2011  . HTN (hypertension) 03/27/2011  . HLD (hyperlipidemia) 03/27/2011  . Generalized anxiety disorder 03/27/2011  . Depression 03/27/2011  . Chronic steroid use 03/27/2011  . Crohn's disease ([place]) 03/27/2011  . OCD (obsessive compulsive disorder) 03/27/2011  . 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 Obstructive sleep apnea, adult 03/27/2011   SPEECH THERAPY DISCHARGE SUMMARY  Visits from Start of Care: 3  Current functional level related to goals / functional outcomes: Partially Met   Remaining deficits: See above   Education / Equipment: N/A  Plan: Patient agrees to discharge.  Patient goals were partially met. Patient is being discharged due to not returning since the last visit.  ?????         Thank you,  Genene Churn, [place]  [facility] 05/10/2020, 5:35 PM  [facility] [address] [place], [place], [address] Phone: [phone]   Fax:  [phone]   Name: [name] MRN: [id number] Date of Birth: 11-08-1976

## 2020-05-24 ENCOUNTER — Encounter (HOSPITAL_COMMUNITY): Payer: BC Managed Care – PPO | Admitting: Speech Pathology

## 2020-06-07 ENCOUNTER — Encounter (HOSPITAL_COMMUNITY): Payer: BC Managed Care – PPO | Admitting: Speech Pathology

## 2020-11-26 ENCOUNTER — Other Ambulatory Visit: Payer: Self-pay

## 2020-11-26 ENCOUNTER — Encounter: Payer: Self-pay | Admitting: Emergency Medicine

## 2020-11-26 ENCOUNTER — Ambulatory Visit
Admission: EM | Admit: 2020-11-26 | Discharge: 2020-11-26 | Disposition: A | Discharge status: Home / Self Care | Payer: BC Managed Care – PPO | Attending: Emergency Medicine | Admitting: Emergency Medicine

## 2020-11-26 DIAGNOSIS — R0789 Other chest pain: Secondary | ICD-10-CM

## 2020-11-26 DIAGNOSIS — M25512 Pain in left shoulder: Secondary | ICD-10-CM | POA: Diagnosis not present

## 2020-11-26 MED ORDER — PREDNISONE 10 MG PO TABS: 20.0000 mg | ORAL_TABLET | Freq: Every day | ORAL | 0 refills | Status: AC

## 2020-11-26 MED ORDER — GABAPENTIN 100 MG PO CAPS: 100.0000 mg | ORAL_CAPSULE | Freq: Two times a day (BID) | ORAL | 0 refills | Status: AC

## 2020-11-26 NOTE — ED Provider Notes (Signed)
[place]   [id number] 11/26/20 Arrival Time: 4259   CC: CHEST PAIN  SUBJECTIVE:  [name] is a 44 y.o. female with history of fibromyalgia presents to the urgent care with a complaint of left shoulder/arm  pain that radiated to the chest for the past few days.  Denies a precipitating event, trauma, recent lower respiratory tract, or strenuous upper body activities, trauma or injury.  Localizes chest pain to the substernal region.  Describes as stable and achy in character.  Rates pain as 8/10.   Has tried OTC medication without relief.  Symptoms are made worse with activity and movement.  Denies radiates symptoms.  Denies previous symptoms in the past.  Denies fever, chills, lightheadedness, dizziness, palpitations, tachycardia, SOB, nausea, vomiting, abdominal pain, changes in bowel or bladder habits, diaphoresis, numbness/tingling in extremities, peripheral edema, or anxiety.    Denies close relatives with cardiac hx  Previous cardiac testing: none.  ROS: As per HPI.  All other pertinent ROS negative.    Past Medical History:  Diagnosis Date  . Anxiety    OCD  . Asthma    as a child  . Cancer [facility]) 2001   renal cell , breast cancer both breast 2011, cervical cancer 2013  . Chronic anemia   . Chronic pain   . Complication of anesthesia    hard to sedate dr hung aware  . Crohn's disease ([place])   . Diabetes mellitus without complication ([place])   . Fracture 05/01/2013   Right foot, in cast  . History of blood transfusion 2 years ago  . Hyperlipidemia   . Hypothyroidism    parathroid runs low  . Meningioma ([place])   . Panic disorder   . PONV (postoperative nausea and vomiting)   . Renal disorder    S/p '11-Kidney transplant-Dr. Florene Glen follows  . Urticaria    Past Surgical History:  Procedure Laterality Date  . ABDOMINAL HYSTERECTOMY  02/13/2012   Procedure: HYSTERECTOMY ABDOMINAL;  Surgeon: Cyril Mourning, MD;  Location: [facility];  Service:  Gynecology;  Laterality: N/A;  . BRAVO [place] STUDY N/A 11/27/2013   Procedure: BRAVO [place];  Surgeon: Beryle Beams, MD;  Location: [facility];  Service: Endoscopy;  Laterality: N/A;  . BRAVO [place] STUDY N/A 12/11/2013   Procedure: BRAVO [place];  Surgeon: Beryle Beams, MD;  Location: [facility];  Service: Endoscopy;  Laterality: N/A;  . breast lumpectomy and reconstruction Bilateral 2011  . BREAST SURGERY     bilateral lumpectomy 4'11  . COLONOSCOPY N/A 09/25/2013   Procedure: COLONOSCOPY;  Surgeon: Beryle Beams, MD;  Location: [facility];  Service: Endoscopy;  Laterality: N/A;  . COLONOSCOPY WITH PROPOFOL N/A 07/29/2015   Procedure: COLONOSCOPY WITH PROPOFOL;  Surgeon: Carol Ada, MD;  Location: [facility];  Service: Endoscopy;  Laterality: N/A;  . CYSTOSCOPY  02/13/2012   Procedure: CYSTOSCOPY;  Surgeon: Franchot Gallo, MD;  Location: [facility];  Service: Urology;  Laterality: N/A;  insertion of ureteral catheter , removed per Dr Diona Fanti   . ESOPHAGEAL MANOMETRY N/A 08/10/2013   Procedure: ESOPHAGEAL MANOMETRY (EM);  Surgeon: Beryle Beams, MD;  [facility];  Service: Endoscopy;  Laterality: N/A;  . ESOPHAGOGASTRODUODENOSCOPY N/A 09/25/2013   Procedure: ESOPHAGOGASTRODUODENOSCOPY (EGD);  Surgeon: Beryle Beams, MD;  Location: [facility];  Service: Endoscopy;  Laterality: N/A;  . ESOPHAGOGASTRODUODENOSCOPY N/A 11/27/2013   Procedure: ESOPHAGOGASTRODUODENOSCOPY (EGD);  Surgeon: Beryle Beams, MD;  Location: [facility];  Service: Endoscopy;  Laterality: N/A;  . ESOPHAGOGASTRODUODENOSCOPY N/A 12/11/2013   Procedure: ESOPHAGOGASTRODUODENOSCOPY (EGD);  Surgeon: Beryle Beams, MD;  Location: [facility];  Service: Endoscopy;  Laterality: N/A;  . GROIN DISSECTION Left 10/09/2013   Procedure: INCISION AND DRAINAGE OF LEFT GROIN ABSCESS;  Surgeon: Shann Medal, MD;  Location: [facility];  Service: General;  Laterality: Left;  . INCISION AND DRAINAGE ABSCESS N/A 08/26/2013    Procedure: INCISION AND DRAINAGE PERIRECTAL ABSCESS;  Surgeon: Jamesetta So, MD;  Location: [facility];  Service: General;  Laterality: N/A;  . KNEE SURGERY Left april 2003   arthroscopy  . LOWER EXTREMITY ARTERIAL DOPLLERS  05/06/2012   Bilat ABIs were attempted-not accessible due to calcified veins. Bilat TBIs demonstrated no obtainable flow through both great toes. Bilat PVR ankle regions demonstrated moderately abnormal pulsatile flow. Lft SFA demonstrated a 50-69% diameter reduction. Lft runoff demonstrated occlusive diseasewith reconstitution of flow noted distally.  Marland Kitchen [place] and 2011   last '11-[facility]  . PARATHYROIDECTOMY  02-20-2001  . SLEEVE GASTROPLASTY     for GERD, hiatal hernia  . TUBAL LIGATION  02-19-2000   Allergies  Allergen Reactions  . Barium-Containing Compounds Anaphylaxis  . Dilaudid [Hydromorphone Hcl] Anaphylaxis    Pt states it makes anxious and itchy   . Ivp Dye [Iodinated Diagnostic Agents] Anaphylaxis  . Shellfish Allergy Shortness Of Breath, Itching and Rash  . Vancomycin Other (See Comments)    Redman syndrome.   . Aspirin Other (See Comments)    Due to multiple gastric issues  . Adhesive [Tape] Dermatitis  . Infed [Iron Dextran] Itching  . Sulfa Antibiotics Hives  . Iopamidol Itching and Rash   No current facility-administered medications on file prior to encounter.   Current Outpatient Medications on File Prior to Encounter  Medication Sig Dispense Refill  . acetaminophen (TYLENOL 8 HOUR) 650 MG CR tablet Take 1 tablet (650 mg total) by mouth every 8 (eight) hours. 30 tablet 0  . acyclovir ointment (ZOVIRAX) 5 % Apply 1 application topically 3 (three) times daily as needed (outbreak).     Marland Kitchen albuterol (PROAIR HFA) 108 (90 Base) MCG/ACT inhaler Inhale 2 puffs into the lungs every 4 (four) hours as needed for wheezing or shortness of breath. 1 Inhaler 1  . ALPRAZolam (XANAX) 0.5 MG tablet Take 0.5 mg by mouth 2 (two) times  daily.     Marland Kitchen amoxicillin-clavulanate (AUGMENTIN) 875-125 MG tablet Take 1 tablet by mouth 2 (two) times daily. One po bid x 7 days 14 tablet 0  . calcitRIOL (ROCALTROL) 0.25 MCG capsule Take 4 capsules by mouth 2 (two) times daily.    . calcium carbonate (TUMS) 500 MG chewable tablet Chew 1-2 tablets by mouth daily as needed for heartburn.     . cephALEXin (KEFLEX) 500 MG capsule Take 1 capsule (500 mg total) by mouth 3 (three) times daily. 21 capsule 0  . cyclobenzaprine (FLEXERIL) 10 MG tablet Take 10 mg by mouth 2 (two) times daily as needed for muscle spasms.     Marland Kitchen dicyclomine (BENTYL) 10 MG capsule Take 20 mg by mouth 3 (three) times daily as needed for spasms.     Marland Kitchen doxepin (SINEQUAN) 10 MG capsule Take 10 mg by mouth at bedtime.    Marland Kitchen doxycycline (VIBRAMYCIN) 100 MG capsule Take 1 capsule (100 mg total) by mouth 2 (two) times daily. 20 capsule 0  . EPINEPHrine 0.3 mg/0.3 mL IJ SOAJ injection Use as directed, if administer call 911    .  FLUoxetine (PROZAC) 20 MG capsule Take 60 mg by mouth daily.     . hydrocortisone 2.5 % ointment Apply 1 application topically every 6 (six) hours as needed (for irritation).     . hydrOXYzine (ATARAX/VISTARIL) 50 MG tablet Take 50 mg by mouth at bedtime.     Marland Kitchen levocetirizine (XYZAL) 5 MG tablet Take 5 mg by mouth every evening.    . loratadine (CLARITIN) 10 MG tablet Take 10 mg by mouth every morning.     . magic mouthwash SOLN Take 5 mLs by mouth every 4 (four) hours as needed for mouth pain.     . metFORMIN (GLUCOPHAGE-XR) 500 MG 24 hr tablet Take 1,000 mg by mouth 2 (two) times daily.    . metoCLOPramide (REGLAN) 10 MG tablet Take 10 mg by mouth 3 (three) times daily as needed for nausea.    . mycophenolate (MYFORTIC) 180 MG EC tablet Take 540 mg by mouth 2 (two) times daily.     Marland Kitchen omeprazole (PRILOSEC) 40 MG capsule Take 40 mg by mouth 2 (two) times daily.     . ondansetron (ZOFRAN) 8 MG tablet Take by mouth every 8 (eight) hours as needed for nausea or  vomiting.    . Oxycodone HCl 10 MG TABS Take 10 mg by mouth 3 (three) times daily as needed for severe pain.     . promethazine (PHENERGAN) 25 MG tablet Take 1 tablet (25 mg total) by mouth every 6 (six) hours as needed for nausea. 10 tablet 0  . rosuvastatin (CRESTOR) 5 MG tablet Take 5 mg by mouth at bedtime.    . sucralfate (CARAFATE) 1 g tablet Take 1 g by mouth 4 (four) times daily -  with meals and at bedtime.    . tacrolimus (PROGRAF) 1 MG capsule Take 3 mg by mouth 2 (two) times daily.    . valACYclovir (VALTREX) 500 MG tablet Take 500-1,000 mg by mouth See admin instructions. 2 on the first day then 1 tab day 2-5     Social History   Socioeconomic History  . Marital status: Married    Spouse name: Not on file  . Number of children: Not on file  . Years of education: Not on file  . Highest education level: Not on file  Occupational History  . Not on file  Tobacco Use  . Smoking status: Never Smoker  . Smokeless tobacco: Never Used  Vaping Use  . Vaping Use: Never assessed  Substance and Sexual Activity  . Alcohol use: No  . Drug use: No  . Sexual activity: Not Currently  Other Topics Concern  . Not on file  Social History Narrative  . Not on file   Social Determinants of Health   Financial Resource Strain:   . Difficulty of Paying Living Expenses: Not on file  Food Insecurity:   . Worried About Charity fundraiser in the Last Year: Not on file  . Ran Out of Food in the Last Year: Not on file  Transportation Needs:   . Lack of Transportation (Medical): Not on file  . Lack of Transportation (Non-Medical): Not on file  Physical Activity:   . Days of Exercise per Week: Not on file  . Minutes of Exercise per Session: Not on file  Stress:   . Feeling of Stress : Not on file  Social Connections:   . Frequency of Communication with Friends and Family: Not on file  . Frequency of Social Gatherings with Friends and  Family: Not on file  . Attends Religious Services: Not  on file  . Active Member of Clubs or Organizations: Not on file  . Attends Archivist Meetings: Not on file  . Marital Status: Not on file  Intimate Partner Violence:   . Fear of Current or Ex-Partner: Not on file  . Emotionally Abused: Not on file  . Physically Abused: Not on file  . Sexually Abused: Not on file   Family History  Problem Relation Age of Onset  . Diabetes Mother   . Hypertension Mother   . Asthma Mother   . Diabetes Father   . Hypertension Father   . Cancer Father        Prostate cancer  . Asthma Father   . Obesity Sister   . Eczema Sister   . Stroke Maternal Grandmother   . Heart attack Maternal Grandfather   . Polycystic ovary syndrome Sister      OBJECTIVE:  Vitals:   11/26/20 0912 11/26/20 0914  BP:  109/73  Pulse:  68  Resp:  18  Temp:  98.3 F (36.8 C)  TempSrc:  Oral  SpO2:  96%  Weight: 170 lb (77.1 kg)   Height: 5\' 3"  (1.6 m)     General appearance: alert; no distress Eyes: PERRLA; EOMI; conjunctiva normal HENT: normocephalic; atraumatic Neck: supple Lungs: clear to auscultation bilaterally without adventitious breath sounds Heart: regular rate and rhythm.  Clear S1 and S2 without rubs, gallops, or murmur. Chest Wall:  no thrills Abdomen: soft, non-tender; bowel sounds normal; no masses or organomegaly; no guarding or rebound tenderness Extremities: no cyanosis or edema; symmetrical with no gross deformities. Tenderness on palpation, limited range of motion left shoulder, no swelling, erythema, lesion or warmth present Skin: warm and dry Psychological: alert and cooperative; normal mood and affect  ECG: Orders placed or performed during the hospital encounter of 12/10/18  . EKG 12-Lead  . EKG 12-Lead  . EKG    EKG normal sinus rhythm without ST elevations, depressions, or prolonged PR interval.  No narrowing or widening of the QRS complexes.    LABS:  Results for orders placed or performed during the hospital  encounter of 12/28/19  Culture, blood (Routine x 2)   Specimen: BLOOD  Result Value Ref Range   Specimen Description BLOOD LEFT ANTECUBITAL    Special Requests      BOTTLES DRAWN AEROBIC AND ANAEROBIC Blood Culture adequate volume   Culture      NO GROWTH 5 DAYS Performed at [facility] [address], [place], [place] [address]    Report Status 01/02/2020 FINAL   Comprehensive metabolic panel  Result Value Ref Range   Sodium 134 (L) 135 - 145 mmol/L   Potassium 3.7 3.5 - 5.1 mmol/L   Chloride 101 98 - 111 mmol/L   CO2 21 (L) 22 - 32 mmol/L   Glucose, Bld 190 (H) 70 - 99 mg/dL   BUN 20 6 - 20 mg/dL   Creatinine, Ser 1.63 (H) 0.44 - 1.00 mg/dL   Calcium 8.2 (L) 8.9 - 10.3 mg/dL   Total Protein 6.9 6.5 - 8.1 g/dL   Albumin 3.4 (L) 3.5 - 5.0 g/dL   AST 20 15 - 41 U/L   ALT 22 0 - 44 U/L   Alkaline Phosphatase 68 38 - 126 U/L   Total Bilirubin 1.2 0.3 - 1.2 mg/dL   GFR calc non Af Amer 38 (L) >60 mL/min   GFR calc  Af Amer 44 (L) >60 mL/min   Anion gap 12 5 - 15  Lactic acid, plasma  Result Value Ref Range   Lactic Acid, Venous 2.5 (HH) 0.5 - 1.9 mmol/L  CBC with Differential  Result Value Ref Range   WBC 30.2 (H) 4.0 - 10.5 K/uL   RBC 4.39 3.87 - 5.11 MIL/uL   Hemoglobin 11.5 (L) 12.0 - 15.0 g/dL   HCT 38.1 36 - 46 %   MCV 86.8 80.0 - 100.0 fL   MCH 26.2 26.0 - 34.0 pg   MCHC 30.2 30.0 - 36.0 g/dL   RDW 14.2 11.5 - 15.5 %   Platelets 242 150 - 400 K/uL   nRBC 0.0 0.0 - 0.2 %   Neutrophils Relative % 88 %   Neutro Abs 26.6 (H) 1.7 - 7.7 K/uL   Lymphocytes Relative 4 %   Lymphs Abs 1.2 0.7 - 4.0 K/uL   Monocytes Relative 8 %   Monocytes Absolute 2.4 (H) 0.1 - 1.0 K/uL   Eosinophils Relative 0 %   Eosinophils Absolute 0.0 0.0 - 0.5 K/uL   Basophils Relative 0 %   Basophils Absolute 0.0 0.0 - 0.1 K/uL   WBC Morphology See Note    nRBC 0 0 /100 WBC   Abs Immature Granulocytes 0.00 0.00 - 0.07 K/uL  Protime-INR  Result Value Ref Range   Prothrombin Time 15.0  11.4 - 15.2 seconds   INR 1.2 0.8 - 1.2  I-Stat beta hCG blood, ED  Result Value Ref Range   I-stat hCG, quantitative 8.7 (H) <5 mIU/mL   Comment 3           Labs Reviewed - No data to display  DIAGNOSTIC STUDIES:  No results found.   ASSESSMENT & PLAN:  1. Other chest pain   2. Acute pain of left shoulder     Meds ordered this encounter  Medications  . gabapentin (NEURONTIN) 100 MG capsule    Sig: Take 1 capsule (100 mg total) by mouth 2 (two) times daily.    Dispense:  30 capsule    Refill:  0  . predniSONE (DELTASONE) 10 MG tablet    Sig: Take 2 tablets (20 mg total) by mouth daily for 5 days.    Dispense:  10 tablet    Refill:  0    Patient history and exam consistent with non-cardiac cause of chest pain. Worsening signs and symptoms discussed and patient verbalized understanding.  Discharge instructions  Prescribed prednisone 20 mg for 5 days Gabapentin was added Chest pain precautions given. Follow-up with PCP Return or go to ED if you develop any new or worsening of his symptom  Reviewed expectations re: course of current medical issues. Questions answered. Outlined signs and symptoms indicating need for more acute intervention. Patient verbalized understanding. After Visit Summary given.   Emerson Monte, FNP 11/26/20 1006

## 2020-11-26 NOTE — Discharge Instructions (Addendum)
Prescribed prednisone 20 mg for 5 days Gabapentin was added Continue to use OTC Tylenol as needed for pain Chest pain precautions given. Follow-up with PCP Return or go to ED if you develop any new or worsening of his symptom

## 2020-11-26 NOTE — ED Triage Notes (Signed)
LT shoulder pain that radiates to elbow that hurts more with movement since Monday.  Then cp started in center of chest on and off that is worse with movement since Friday. Denies any injury.

## 2020-12-08 ENCOUNTER — Telehealth: Payer: Self-pay

## 2020-12-08 NOTE — Telephone Encounter (Signed)
Patient has referral for duplex and new vascular visit. Spoke to patient, she has a graft in her left arm and it is causing her pain like it is "pulling on her tendons". She has not had dialysis in 22 years and would like the graft removed. Verified with PA that patient does not need duplex and put her on as a new vascular patient in [place].

## 2020-12-26 ENCOUNTER — Encounter: Payer: BC Managed Care – PPO | Admitting: Vascular Surgery

## 2021-01-09 ENCOUNTER — Encounter: Payer: BC Managed Care – PPO | Admitting: Vascular Surgery

## 2021-01-31 ENCOUNTER — Other Ambulatory Visit: Payer: BC Managed Care – PPO

## 2021-01-31 DIAGNOSIS — Z20822 Contact with and (suspected) exposure to covid-19: Secondary | ICD-10-CM

## 2021-02-01 LAB — SARS-COV-2, NAA 2 DAY TAT

## 2021-02-01 LAB — NOVEL CORONAVIRUS, NAA: SARS-CoV-2, NAA: NOT DETECTED

## 2021-02-13 ENCOUNTER — Encounter: Payer: BC Managed Care – PPO | Admitting: Vascular Surgery

## 2021-06-05 ENCOUNTER — Other Ambulatory Visit: Payer: Self-pay

## 2021-06-05 ENCOUNTER — Ambulatory Visit
Admission: EM | Admit: 2021-06-05 | Discharge: 2021-06-05 | Disposition: A | Discharge status: Home / Self Care | Payer: BC Managed Care – PPO | Attending: Family Medicine | Admitting: Family Medicine

## 2021-06-05 ENCOUNTER — Encounter: Payer: Self-pay | Admitting: Emergency Medicine

## 2021-06-05 DIAGNOSIS — R300 Dysuria: Secondary | ICD-10-CM | POA: Diagnosis present

## 2021-06-05 DIAGNOSIS — R509 Fever, unspecified: Secondary | ICD-10-CM | POA: Insufficient documentation

## 2021-06-05 DIAGNOSIS — B349 Viral infection, unspecified: Secondary | ICD-10-CM

## 2021-06-05 DIAGNOSIS — R52 Pain, unspecified: Secondary | ICD-10-CM | POA: Diagnosis present

## 2021-06-05 LAB — POCT URINALYSIS DIP (MANUAL ENTRY)
Bilirubin, UA: NEGATIVE
Blood, UA: NEGATIVE
Glucose, UA: NEGATIVE mg/dL
Ketones, POC UA: NEGATIVE mg/dL
Nitrite, UA: NEGATIVE
Spec Grav, UA: 1.015 (ref 1.010–1.025)
Urobilinogen, UA: NEGATIVE E.U./dL — AB
pH, UA: 5 (ref 5.0–8.0)

## 2021-06-05 NOTE — Discharge Instructions (Addendum)
Urine has small amount bacteria, will culture to see if infection is present and notify you in 3-5 days if treatment is warranted. Your COVID 19 results should result within 3-5 days. Negative results are immediately resulted to Mychart. Positive results will receive a follow-up call from our clinic. If symptoms are present, I recommend home quarantine until results are known.  Alternate Tylenol and ibuprofen as needed for body aches and fever.  Symptom management per recommendations discussed today.  If any breathing difficulty or chest pain develops go immediately to the closest emergency department for evaluation.  Remain out of work until results of COVID/Flu test are known

## 2021-06-05 NOTE — ED Provider Notes (Signed)
[facility]    CSN: [id number] Arrival date & time: 06/05/21  1349      History   Chief Complaint No chief complaint on file.   HPI [name] is a 45 y.o. female.   HPI Patient presents with URI symptoms including with associated fever, body aches, sore throat, and ear pain. No known sick exposures.  Patient has only 1 working kidney along with type 2 diabetes which are high risk conditions increasing risk of complications with COVID.  She has been taken Tylenol for management of fever and body aches.  She denies any shortness of breath and chest pain.  Endorses weakness and a poor appetite. Past Medical History:  Diagnosis Date   Anxiety    OCD   Asthma    as a child   Cancer ([place]) 2001   renal cell , breast cancer both breast 2011, cervical cancer 2013   Chronic anemia    Chronic pain    Complication of anesthesia    hard to sedate dr hung aware   Crohn's disease ([place])    Diabetes mellitus without complication ([place])    Fracture 05/01/2013   Right foot, in cast   History of blood transfusion 2 years ago   Hyperlipidemia    Hypothyroidism    parathroid runs low   Meningioma ([facility])    Panic disorder    PONV (postoperative nausea and vomiting)    Renal disorder    S/p '11-Kidney transplant-Dr. Florene Glen follows   Urticaria     Patient Active Problem List   Diagnosis Date Noted   UTI (lower urinary tract infection) 11/20/2013   CKD (chronic kidney disease) stage 3, GFR 30-59 ml/min ([place]) 08/25/2013   Perirectal abscess 08/24/2013   ESRD (end stage renal disease) ([place]) 03/27/2011   Kidney transplant status, living unrelated donor 03/27/2011   Anemia 03/27/2011   Type 2 diabetes mellitus ([place]) 03/27/2011   Osteoarthritis of right knee 03/27/2011   Lumbago 03/27/2011   HTN (hypertension) 03/27/2011   HLD (hyperlipidemia) 03/27/2011   Generalized anxiety disorder 03/27/2011   Depression 03/27/2011   Chronic steroid use 03/27/2011   Crohn's  disease ([place]) 03/27/2011   OCD (obsessive compulsive disorder) 03/27/2011   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   Obstructive sleep apnea, adult 03/27/2011    Past Surgical History:  Procedure Laterality Date   ABDOMINAL HYSTERECTOMY  02/13/2012   Procedure: HYSTERECTOMY ABDOMINAL;  Surgeon: Cyril Mourning, MD;  Location: [facility];  Service: Gynecology;  Laterality: N/A;   BRAVO [place] STUDY N/A 11/27/2013   Procedure: BRAVO [place] STUDY;  Surgeon: Beryle Beams, MD;  Location: [facility];  Service: Endoscopy;  Laterality: N/A;   BRAVO [place] STUDY N/A 12/11/2013   Procedure: BRAVO [place] STUDY;  Surgeon: Beryle Beams, MD;  Location: [facility];  Service: Endoscopy;  Laterality: N/A;   breast lumpectomy and reconstruction Bilateral 2011   BREAST SURGERY     bilateral lumpectomy 4'11   COLONOSCOPY N/A 09/25/2013   Procedure: COLONOSCOPY;  Surgeon: Beryle Beams, MD;  Location: [facility];  Service: Endoscopy;  Laterality: N/A;   COLONOSCOPY WITH PROPOFOL N/A 07/29/2015   Procedure: COLONOSCOPY WITH PROPOFOL;  Surgeon: Carol Ada, MD;  Location: [facility];  Service: Endoscopy;  Laterality: N/A;   CYSTOSCOPY  02/13/2012   Procedure: CYSTOSCOPY;  Surgeon: Franchot Gallo, MD;  Location: [facility];  Service: Urology;  Laterality: N/A;  insertion of ureteral catheter , removed per Dr Diona Fanti    ESOPHAGEAL  MANOMETRY N/A 08/10/2013   Procedure: ESOPHAGEAL MANOMETRY (EM);  Surgeon: Beryle Beams, MD;  Location: [facility];  Service: Endoscopy;  Laterality: N/A;   ESOPHAGOGASTRODUODENOSCOPY N/A 09/25/2013   Procedure: ESOPHAGOGASTRODUODENOSCOPY (EGD);  Surgeon: Beryle Beams, MD;  Location: [facility];  Service: Endoscopy;  Laterality: N/A;   ESOPHAGOGASTRODUODENOSCOPY N/A 11/27/2013   Procedure: ESOPHAGOGASTRODUODENOSCOPY (EGD);  Surgeon: Beryle Beams, MD;  Location: [facility];  Service: Endoscopy;  Laterality: N/A;   ESOPHAGOGASTRODUODENOSCOPY N/A 12/11/2013   Procedure:  ESOPHAGOGASTRODUODENOSCOPY (EGD);  Surgeon: Beryle Beams, MD;  Location: [facility];  Service: Endoscopy;  Laterality: N/A;   GROIN DISSECTION Left 10/09/2013   Procedure: INCISION AND DRAINAGE OF LEFT GROIN ABSCESS;  Surgeon: Shann Medal, MD;  Location: [facility];  Service: General;  Laterality: Left;   INCISION AND DRAINAGE ABSCESS N/A 08/26/2013   Procedure: INCISION AND DRAINAGE PERIRECTAL ABSCESS;  Surgeon: Jamesetta So, MD;  Location: [facility];  Service: General;  Laterality: N/A;   KNEE SURGERY Left april 2003   arthroscopy   LOWER EXTREMITY ARTERIAL DOPLLERS  05/06/2012   Bilat ABIs were attempted-not accessible due to calcified veins. Bilat TBIs demonstrated no obtainable flow through both great toes. Bilat PVR ankle regions demonstrated moderately abnormal pulsatile flow. Lft SFA demonstrated a 50-69% diameter reduction. Lft runoff demonstrated occlusive diseasewith reconstitution of flow noted distally.   NEPHRECTOMY TRANSPLANTED ORGAN  1999 and 2011   last '11-[facility]   PARATHYROIDECTOMY  02-20-2001   SLEEVE GASTROPLASTY     for GERD, hiatal hernia   TUBAL LIGATION  02-19-2000    OB History     Gravida  1   Para      Term      Preterm      AB  1   Living  0      SAB  1   IAB      Ectopic      Multiple      Live Births               Home Medications    Prior to Admission medications   Medication Sig Start Date End Date Taking? Authorizing Provider  amitriptyline (ELAVIL) 10 MG tablet Take 10 mg by mouth at bedtime.   Yes [provider]  busPIRone (BUSPAR) 15 MG tablet Take 15 mg by mouth 3 (three) times daily.   Yes [provider]  mycophenolate (MYFORTIC) 180 MG EC tablet Take 180 mg by mouth 2 (two) times daily.   Yes [provider]  predniSONE (DELTASONE) 10 MG tablet Take 10 mg by mouth daily with breakfast.   Yes [provider]  tacrolimus (PROGRAF) 1 MG capsule Take 1 mg by mouth 2 (two) times daily.    Yes [provider]  acetaminophen (TYLENOL 8 HOUR) 650 MG CR tablet Take 1 tablet (650 mg total) by mouth every 8 (eight) hours. 05/08/18   Varney Biles, MD  acyclovir ointment (ZOVIRAX) 5 % Apply 1 application topically 3 (three) times daily as needed (outbreak).     [provider]  albuterol (PROAIR HFA) 108 (90 Base) MCG/ACT inhaler Inhale 2 puffs into the lungs every 4 (four) hours as needed for wheezing or shortness of breath. 04/11/17   Valentina Shaggy, MD  ALPRAZolam Duanne Moron) 0.5 MG tablet Take 0.5 mg by mouth 2 (two) times daily.     [provider]  amoxicillin-clavulanate (AUGMENTIN) 875-125 MG tablet Take 1 tablet by mouth 2 (two) times daily. One  po bid x 7 days 07/04/18   Sherwood Gambler, MD  calcitRIOL (ROCALTROL) 0.25 MCG capsule Take 4 capsules by mouth 2 (two) times daily. 04/04/12   [provider]  calcium carbonate (TUMS) 500 MG chewable tablet Chew 1-2 tablets by mouth daily as needed for heartburn.  04/04/12   [provider]  cephALEXin (KEFLEX) 500 MG capsule Take 1 capsule (500 mg total) by mouth 3 (three) times daily. 12/10/18   Milton Ferguson, MD  cyclobenzaprine (FLEXERIL) 10 MG tablet Take 10 mg by mouth 2 (two) times daily as needed for muscle spasms.  05/28/17   [provider]  dicyclomine (BENTYL) 10 MG capsule Take 20 mg by mouth 3 (three) times daily as needed for spasms.     [provider]  doxepin (SINEQUAN) 10 MG capsule Take 10 mg by mouth at bedtime. 04/03/18 04/03/19  [provider]  doxycycline (VIBRAMYCIN) 100 MG capsule Take 1 capsule (100 mg total) by mouth 2 (two) times daily. 07/07/18   Davonna Belling, MD  EPINEPHrine 0.3 mg/0.3 mL IJ SOAJ injection Use as directed, if administer call 911 02/08/17   [provider]  FLUoxetine (PROZAC) 20 MG capsule Take 60 mg by mouth daily.     [provider]  gabapentin (NEURONTIN) 100 MG capsule Take 1 capsule (100 mg total)  by mouth 2 (two) times daily. 11/26/20   Avegno, Darrelyn Hillock, FNP  hydrocortisone 2.5 % ointment Apply 1 application topically every 6 (six) hours as needed (for irritation).     [provider]  hydrOXYzine (ATARAX/VISTARIL) 50 MG tablet Take 50 mg by mouth at bedtime.     [provider]  levocetirizine (XYZAL) 5 MG tablet Take 5 mg by mouth every evening.    [provider]  loratadine (CLARITIN) 10 MG tablet Take 10 mg by mouth every morning.     [provider]  magic mouthwash SOLN Take 5 mLs by mouth every 4 (four) hours as needed for mouth pain.  04/12/16   [provider]  metFORMIN (GLUCOPHAGE-XR) 500 MG 24 hr tablet Take 1,000 mg by mouth 2 (two) times daily.    [provider]  metoCLOPramide (REGLAN) 10 MG tablet Take 10 mg by mouth 3 (three) times daily as needed for nausea.    [provider]  mycophenolate (MYFORTIC) 180 MG EC tablet Take 540 mg by mouth 2 (two) times daily.     Niel Hummer, NP  omeprazole (PRILOSEC) 40 MG capsule Take 40 mg by mouth 2 (two) times daily.     [provider]  ondansetron (ZOFRAN) 8 MG tablet Take by mouth every 8 (eight) hours as needed for nausea or vomiting.    [provider]  Oxycodone HCl 10 MG TABS Take 10 mg by mouth 3 (three) times daily as needed for severe pain.     [provider]  promethazine (PHENERGAN) 25 MG tablet Take 1 tablet (25 mg total) by mouth every 6 (six) hours as needed for nausea. 06/11/17   Davonna Belling, MD  rosuvastatin (CRESTOR) 5 MG tablet Take 5 mg by mouth at bedtime.    [provider]  sucralfate (CARAFATE) 1 g tablet Take 1 g by mouth 4 (four) times daily -  with meals and at bedtime.    [provider]  tacrolimus (PROGRAF) 1 MG capsule Take 3 mg by mouth 2 (two) times daily.    [provider]  valACYclovir (VALTREX) 500 MG tablet Take  500-1,000 mg by mouth See admin instructions. 2 on the first  day then 1 tab day 2-5    [provider]    Family History Family History  Problem Relation Age of Onset   Diabetes Mother    Hypertension Mother    Asthma Mother    Diabetes Father    Hypertension Father    Cancer Father        Prostate cancer   Asthma Father    Obesity Sister    Eczema Sister    Stroke Maternal Grandmother    Heart attack Maternal Grandfather    Polycystic ovary syndrome Sister     Social History Social History   Tobacco Use   Smoking status: Never   Smokeless tobacco: Never  Substance Use Topics   Alcohol use: No   Drug use: No     Allergies   Barium-containing compounds, Dilaudid [hydromorphone hcl], Ivp dye [iodinated diagnostic agents], Shellfish allergy, Vancomycin, Aspirin, Adhesive [tape], Infed [iron dextran], Sulfa antibiotics, and Iopamidol   Review of Systems Review of Systems Pertinent negatives listed in HPI   Physical Exam Triage Vital Signs ED Triage Vitals  Enc Vitals Group     BP 06/05/21 1446 131/67     Pulse Rate 06/05/21 1446 79     Resp 06/05/21 1446 16     Temp 06/05/21 1446 100.1 F (37.8 C)     Temp Source 06/05/21 1446 Oral     SpO2 06/05/21 1446 99 %     Weight --      Height --      Head Circumference --      Peak Flow --      Pain Score 06/05/21 1447 7     Pain Loc --      Pain Edu? --      Excl. in [place]? --    No data found.  Updated Vital Signs BP 131/67 (BP Location: Right Arm)   Pulse 79   Temp 100.1 F (37.8 C) (Oral)   Resp 16   LMP 01/30/2012   SpO2 99%   Visual Acuity Right Eye Distance:   Left Eye Distance:   Bilateral Distance:    Right Eye Near:   Left Eye Near:    Bilateral Near:     Physical Exam  General Appearance:    Alert, acutely ill-appearing, cooperative, no distress  HENT:   Normocephalic, ears normal, nares with congestion, rhinorrhea, oropharynx  clear   Eyes:    PERRL, conjunctiva/corneas clear, EOM's intact       Lungs:     Clear to auscultation  bilaterally, respirations unlabored  Heart:    Regular rate and rhythm  Neurologic:   Awake, alert, oriented x 3. No apparent focal neurological           defect.      UC Treatments / Results  Labs (all labs ordered are listed, but only abnormal results are displayed) Labs Reviewed  COVID-19, FLU A+B NAA    EKG   Radiology No results found.  Procedures Procedures (including critical care time)  Medications Ordered in UC Medications - No data to display  Initial Impression / Assessment and Plan / UC Course  I have reviewed the triage vital signs and the nursing notes.  Pertinent labs & imaging results that were available during my care of the patient were reviewed by me and considered in my medical decision making (see chart for details).    COVID flu pending.  UA collected as patient has had some dysuria however a small amount of bacteria is present will await culture before any treatment.  In the meantime recommended hydrating well with fluid.  Patient is on chronic steroids induced diabetes.  Continue Tylenol for generalized body aches.  Continue to monitor temperature.  If any of your symptoms become severe go immediately to the ER.  Our office will contact you for any abnormal labs that warrant any additional treatment Final Clinical Impressions(s) / UC Diagnoses   Final diagnoses:  Viral illness  Dysuria  Fever, unspecified  Body aches     Discharge Instructions      Urine has small amount bacteria, will culture to see if infection is present and notify you in 3-5 days if treatment is warranted. Your COVID 19 results should result within 3-5 days. Negative results are immediately resulted to Mychart. Positive results will receive a follow-up call from our clinic. If symptoms are present, I recommend home quarantine until results are known.  Alternate Tylenol and ibuprofen as needed for body aches and fever.  Symptom management per recommendations discussed today.   If any breathing difficulty or chest pain develops go immediately to the closest emergency department for evaluation.  Remain out of work until results of COVID/Flu test are known     ED Prescriptions   None    PDMP not reviewed this encounter.   Scot Jun, FNP 06/10/21 [phone]

## 2021-06-05 NOTE — ED Triage Notes (Signed)
Body aches since last week.  Sore throat and ear pain started Sunday.  At home covid test was negative this morning.

## 2021-06-06 LAB — COVID-19, FLU A+B NAA
Influenza A, NAA: NOT DETECTED
Influenza B, NAA: NOT DETECTED
SARS-CoV-2, NAA: DETECTED — AB

## 2021-06-08 LAB — URINE CULTURE: Culture: >=100000 — AB

## 2021-06-09 ENCOUNTER — Telehealth (HOSPITAL_COMMUNITY): Payer: Self-pay | Admitting: Emergency Medicine

## 2021-06-09 MED ORDER — NITROFURANTOIN MONOHYD MACRO 100 MG PO CAPS: 100.0000 mg | ORAL_CAPSULE | Freq: Two times a day (BID) | ORAL | 0 refills | Status: AC

## 2022-10-04 ENCOUNTER — Ambulatory Visit (INDEPENDENT_AMBULATORY_CARE_PROVIDER_SITE_OTHER): Payer: Medicare HMO

## 2022-10-04 ENCOUNTER — Other Ambulatory Visit: Payer: Self-pay

## 2022-10-04 ENCOUNTER — Ambulatory Visit: Payer: Self-pay

## 2022-10-04 ENCOUNTER — Encounter: Payer: Self-pay | Admitting: Emergency Medicine

## 2022-10-04 ENCOUNTER — Ambulatory Visit
Admission: EM | Admit: 2022-10-04 | Discharge: 2022-10-04 | Disposition: A | Discharge status: Home / Self Care | Payer: Medicare HMO

## 2022-10-04 DIAGNOSIS — W19XXXA Unspecified fall, initial encounter: Secondary | ICD-10-CM

## 2022-10-04 DIAGNOSIS — R0782 Intercostal pain: Secondary | ICD-10-CM | POA: Diagnosis not present

## 2022-10-04 DIAGNOSIS — S20211A Contusion of right front wall of thorax, initial encounter: Secondary | ICD-10-CM | POA: Diagnosis not present

## 2022-10-04 NOTE — ED Triage Notes (Signed)
Pt reports right side rib pain since fall while walking into work. Pt denies any loc or hitting head, blood thinners. Pt reports right sided discomfort that is worse with movement.chest expansion symmetrical. No obvious deformity noted.

## 2022-10-04 NOTE — ED Provider Notes (Signed)
[facility]    CSN: [id number] Arrival date & time: 10/04/22  1341      History   Chief Complaint Chief Complaint  Patient presents with   Fall    HPI [name] is a 46 y.o. female.   The history is provided by the patient.   Patient presents for right sided rib cage pain after fall 1 day ago.  Patient states that since that time, she has had pain to the right rib cage.  She states pain worsens with certain movement, laughing, and deep breathing.  She also states that the area is swollen.  She does not recall if the area is bruised.  She states that she does not have any shortness of breath, difficulty breathing, or trouble breathing.  She states that she did apply a lidocaine patch to the area for pain.  Past Medical History:  Diagnosis Date   Anxiety    OCD   Asthma    as a child   Cancer ([place]) 2001   renal cell , breast cancer both breast 2011, cervical cancer 2013   Chronic anemia    Chronic pain    Complication of anesthesia    hard to sedate dr hung aware   Crohn's disease ([place])    Diabetes mellitus without complication ([place])    Fracture 05/01/2013   Right foot, in cast   History of blood transfusion 2 years ago   Hyperlipidemia    Hypothyroidism    parathroid runs low   Meningioma (H[facility])    Panic disorder    PONV (postoperative nausea and vomiting)    Renal disorder    S/p '11-Kidney transplant-Dr. Florene Glen follows   Urticaria     Patient Active Problem List   Diagnosis Date Noted   UTI (lower urinary tract infection) 11/20/2013   CKD (chronic kidney disease) stage 3, GFR 30-59 ml/min ([place]) 08/25/2013   Perirectal abscess 08/24/2013   ESRD (end stage renal disease) ([place]) 03/27/2011   Kidney transplant status, living unrelated donor 03/27/2011   Anemia 03/27/2011   Type 2 diabetes mellitus ([place]) 03/27/2011   Osteoarthritis of right knee 03/27/2011   Lumbago 03/27/2011   HTN (hypertension) 03/27/2011   HLD (hyperlipidemia)  03/27/2011   Generalized anxiety disorder 03/27/2011   Depression 03/27/2011   Chronic steroid use 03/27/2011   Crohn's disease ([place]) 03/27/2011   OCD (obsessive compulsive disorder) 03/27/2011   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   Obstructive sleep apnea, adult 03/27/2011    Past Surgical History:  Procedure Laterality Date   ABDOMINAL HYSTERECTOMY  02/13/2012   Procedure: HYSTERECTOMY ABDOMINAL;  Surgeon: Cyril Mourning, MD;  Location: [facility];  Service: Gynecology;  Laterality: N/A;   BRAVO [place] STUDY N/A 11/27/2013   Procedure: BRAVO [place] STUDY;  Surgeon: Beryle Beams, MD;  Location: [facility];  Service: Endoscopy;  Laterality: N/A;   BRAVO [place] STUDY N/A 12/11/2013   Procedure: BRAVO [place] STUDY;  Surgeon: Beryle Beams, MD;  Location: [facility];  Service: Endoscopy;  Laterality: N/A;   breast lumpectomy and reconstruction Bilateral 2011   BREAST SURGERY     bilateral lumpectomy 4'11   COLONOSCOPY N/A 09/25/2013   Procedure: COLONOSCOPY;  Surgeon: Beryle Beams, MD;  Location: [facility];  Service: Endoscopy;  Laterality: N/A;   COLONOSCOPY WITH PROPOFOL N/A 07/29/2015   Procedure: COLONOSCOPY WITH PROPOFOL;  Surgeon: Carol Ada, MD;  Location: [facility];  Service: Endoscopy;  Laterality: N/A;   CYSTOSCOPY  02/13/2012  Procedure: CYSTOSCOPY;  Surgeon: Franchot Gallo, MD;  Location: [facility];  Service: Urology;  Laterality: N/A;  insertion of ureteral catheter , removed per Dr Diona Fanti    ESOPHAGEAL MANOMETRY N/A 08/10/2013   Procedure: ESOPHAGEAL MANOMETRY (EM);  Surgeon: Beryle Beams, MD;  Location: [facility];  Service: Endoscopy;  Laterality: N/A;   ESOPHAGOGASTRODUODENOSCOPY N/A 09/25/2013   Procedure: ESOPHAGOGASTRODUODENOSCOPY (EGD);  Surgeon: Beryle Beams, MD;  Location: [facility];  Service: Endoscopy;  Laterality: N/A;   ESOPHAGOGASTRODUODENOSCOPY N/A 11/27/2013   Procedure: ESOPHAGOGASTRODUODENOSCOPY (EGD);  Surgeon: Beryle Beams, MD;  Location: [facility];  Service: Endoscopy;  Laterality: N/A;   ESOPHAGOGASTRODUODENOSCOPY N/A 12/11/2013   Procedure: ESOPHAGOGASTRODUODENOSCOPY (EGD);  Surgeon: Beryle Beams, MD;  Location: [facility];  Service: Endoscopy;  Laterality: N/A;   GROIN DISSECTION Left 10/09/2013   Procedure: INCISION AND DRAINAGE OF LEFT GROIN ABSCESS;  Surgeon: Shann Medal, MD;  Location: [facility];  Service: General;  Laterality: Left;   INCISION AND DRAINAGE ABSCESS N/A 08/26/2013   Procedure: INCISION AND DRAINAGE PERIRECTAL ABSCESS;  Surgeon: Jamesetta So, MD;  Location: [facility];  Service: General;  Laterality: N/A;   KNEE SURGERY Left april 2003   arthroscopy   LOWER EXTREMITY ARTERIAL DOPLLERS  05/06/2012   Bilat ABIs were attempted-not accessible due to calcified veins. Bilat TBIs demonstrated no obtainable flow through both great toes. Bilat PVR ankle regions demonstrated moderately abnormal pulsatile flow. Lft SFA demonstrated a 50-69% diameter reduction. Lft runoff demonstrated occlusive diseasewith reconstitution of flow noted distally.   NEPHRECTOMY TRANSPLANTED ORGAN  1999 and 2011   last '11-[facility]   PARATHYROIDECTOMY  02-20-2001   SLEEVE GASTROPLASTY     for GERD, hiatal hernia   TUBAL LIGATION  02-19-2000    OB History     Gravida  1   Para      Term      Preterm      AB  1   Living  0      SAB  1   IAB      Ectopic      Multiple      Live Births               Home Medications    Prior to Admission medications   Medication Sig Start Date End Date Taking? Authorizing Provider  montelukast (SINGULAIR) 10 MG tablet Take 5 mg by mouth at bedtime.   Yes [provider]  acetaminophen (TYLENOL 8 HOUR) 650 MG CR tablet Take 1 tablet (650 mg total) by mouth every 8 (eight) hours. 05/08/18   Varney Biles, MD  acyclovir ointment (ZOVIRAX) 5 % Apply 1 application topically 3 (three) times daily as needed (outbreak).     [provider]  albuterol (PROAIR HFA) 108  (90 Base) MCG/ACT inhaler Inhale 2 puffs into the lungs every 4 (four) hours as needed for wheezing or shortness of breath. 04/11/17   Valentina Shaggy, MD  ALPRAZolam Duanne Moron) 0.5 MG tablet Take 0.5 mg by mouth 2 (two) times daily.     [provider]  amitriptyline (ELAVIL) 10 MG tablet Take 10 mg by mouth at bedtime.    [provider]  amoxicillin-clavulanate (AUGMENTIN) 875-125 MG tablet Take 1 tablet by mouth 2 (two) times daily. One po bid x 7 days 07/04/18   Sherwood Gambler, MD  busPIRone (BUSPAR) 15 MG tablet Take 15 mg by mouth 3 (three) times daily.    [provider]  calcitRIOL (  ROCALTROL) 0.25 MCG capsule Take 4 capsules by mouth 2 (two) times daily. 04/04/12   [provider]  calcium carbonate (TUMS) 500 MG chewable tablet Chew 1-2 tablets by mouth daily as needed for heartburn.  04/04/12   [provider]  cephALEXin (KEFLEX) 500 MG capsule Take 1 capsule (500 mg total) by mouth 3 (three) times daily. 12/10/18   Milton Ferguson, MD  cyclobenzaprine (FLEXERIL) 10 MG tablet Take 10 mg by mouth 2 (two) times daily as needed for muscle spasms.  05/28/17   [provider]  dicyclomine (BENTYL) 10 MG capsule Take 20 mg by mouth 3 (three) times daily as needed for spasms.     [provider]  doxepin (SINEQUAN) 10 MG capsule Take 10 mg by mouth at bedtime. 04/03/18 04/03/19  [provider]  doxycycline (VIBRAMYCIN) 100 MG capsule Take 1 capsule (100 mg total) by mouth 2 (two) times daily. 07/07/18   Davonna Belling, MD  EPINEPHrine 0.3 mg/0.3 mL IJ SOAJ injection Use as directed, if administer call 911 02/08/17   [provider]  FLUoxetine (PROZAC) 20 MG capsule Take 60 mg by mouth daily.     [provider]  gabapentin (NEURONTIN) 100 MG capsule Take 1 capsule (100 mg total) by mouth 2 (two) times daily. 11/26/20   Avegno, Darrelyn Hillock, FNP  hydrocortisone 2.5 % ointment Apply 1 application topically every 6  (six) hours as needed (for irritation).     [provider]  hydrOXYzine (ATARAX/VISTARIL) 50 MG tablet Take 50 mg by mouth at bedtime.     [provider]  levocetirizine (XYZAL) 5 MG tablet Take 5 mg by mouth every evening.    [provider]  loratadine (CLARITIN) 10 MG tablet Take 10 mg by mouth every morning.     [provider]  magic mouthwash SOLN Take 5 mLs by mouth every 4 (four) hours as needed for mouth pain.  04/12/16   [provider]  metFORMIN (GLUCOPHAGE-XR) 500 MG 24 hr tablet Take 1,000 mg by mouth 2 (two) times daily.    [provider]  metoCLOPramide (REGLAN) 10 MG tablet Take 10 mg by mouth 3 (three) times daily as needed for nausea.    [provider]  mycophenolate (MYFORTIC) 180 MG EC tablet Take 540 mg by mouth 2 (two) times daily.     Niel Hummer, NP  mycophenolate (MYFORTIC) 180 MG EC tablet Take 180 mg by mouth 2 (two) times daily.    [provider]  nitrofurantoin, macrocrystal-monohydrate, (MACROBID) 100 MG capsule Take 1 capsule (100 mg total) by mouth 2 (two) times daily. 06/09/21   Chase Picket, MD  omeprazole (PRILOSEC) 40 MG capsule Take 40 mg by mouth 2 (two) times daily.     [provider]  ondansetron (ZOFRAN) 8 MG tablet Take by mouth every 8 (eight) hours as needed for nausea or vomiting.    [provider]  Oxycodone HCl 10 MG TABS Take 10 mg by mouth 3 (three) times daily as needed for severe pain.     [provider]  predniSONE (DELTASONE) 10 MG tablet Take 10 mg by mouth daily with breakfast.    [provider]  promethazine (PHENERGAN) 25 MG tablet Take 1 tablet (25 mg total) by mouth every 6 (six) hours as needed for nausea. 06/11/17   Davonna Belling, MD  rosuvastatin (CRESTOR) 5 MG tablet Take 5 mg by mouth at bedtime.    [provider]  sucralfate (CARAFATE) 1 g tablet Take 1 g by mouth 4 (four) times daily -  with meals and  at bedtime.    [provider]  tacrolimus (PROGRAF) 1 MG capsule Take 3 mg by mouth 2 (two) times daily.    [provider]  tacrolimus (PROGRAF) 1 MG capsule Take 1 mg by mouth 2 (two) times daily.    [provider]  valACYclovir (VALTREX) 500 MG tablet Take 500-1,000 mg by mouth See admin instructions. 2 on the first day then 1 tab day 2-5    [provider]    Family History Family History  Problem Relation Age of Onset   Diabetes Mother    Hypertension Mother    Asthma Mother    Diabetes Father    Hypertension Father    Cancer Father        Prostate cancer   Asthma Father    Obesity Sister    Eczema Sister    Stroke Maternal Grandmother    Heart attack Maternal Grandfather    Polycystic ovary syndrome Sister     Social History Social History   Tobacco Use   Smoking status: Never   Smokeless tobacco: Never  Substance Use Topics   Alcohol use: No   Drug use: No     Allergies   Barium-containing compounds, Ivp dye [iodinated contrast media], Shellfish allergy, Vancomycin, Aspirin, Adhesive [tape], Infed [iron dextran], Sulfa antibiotics, and Iopamidol   Review of Systems Review of Systems Per HPI  Physical Exam Triage Vital Signs ED Triage Vitals [10/04/22 1357]  Enc Vitals Group     BP 111/74     Pulse Rate 89     Resp 20     Temp 99 F (37.2 C)     Temp Source Oral     SpO2 94 %     Weight      Height      Head Circumference      Peak Flow      Pain Score 8     Pain Loc      Pain Edu?      Excl. in [place]?    No data found.  Updated Vital Signs BP 111/74 (BP Location: Right Arm)   Pulse 89   Temp 99 F (37.2 C) (Oral)   Resp 20   LMP 01/30/2012   SpO2 94%   Visual Acuity Right Eye Distance:   Left Eye Distance:   Bilateral Distance:    Right Eye Near:   Left Eye Near:    Bilateral Near:     Physical Exam Vitals and nursing note reviewed.  Constitutional:      General: She is not in acute  distress.    Appearance: Normal appearance.  HENT:     Head: Normocephalic.  Eyes:     Extraocular Movements: Extraocular movements intact.     Conjunctiva/sclera: Conjunctivae normal.     Pupils: Pupils are equal, round, and reactive to light.  Cardiovascular:     Rate and Rhythm: Normal rate and regular rhythm.     Pulses: Normal pulses.     Heart sounds: Normal heart sounds.  Pulmonary:     Effort: Pulmonary effort is normal. No respiratory distress.     Breath sounds: Normal breath sounds. No stridor. No wheezing, rhonchi or rales.  Chest:     Chest wall: Swelling (Swelling to the right rib cage) and tenderness (Tenderness noted to the right chest wall between ribs 5 through  8) present. No mass, crepitus or edema. There is no dullness to percussion.  Musculoskeletal:     Cervical back: Normal range of motion. No rigidity.  Neurological:     Mental Status: She is alert.      UC Treatments / Results  Labs (all labs ordered are listed, but only abnormal results are displayed) Labs Reviewed - No data to display  EKG   Radiology DG Ribs Unilateral W/Chest Right  Result Date: 10/04/2022 CLINICAL DATA:  right ribcage pain s/p fall x 1 day EXAM: RIGHT RIBS AND CHEST - 3+ VIEW COMPARISON:  12/28/2019 FINDINGS: Heart is normal size. Mediastinal contours within normal limits. Aortic calcifications. No confluent opacities or effusions. No pneumothorax. No visible rib fracture. IMPRESSION: No visible rib fracture.  No acute cardiopulmonary disease. Electronically Signed   By: Rolm Baptise M.D.   On: 10/04/2022 14:25    Procedures Procedures (including critical care time)  Medications Ordered in UC Medications - No data to display  Initial Impression / Assessment and Plan / UC Course  I have reviewed the triage vital signs and the nursing notes.  Pertinent labs & imaging results that were available during my care of the patient were reviewed by me and considered in my medical  decision making (see chart for details).  Patient presents for complaints of pain to the left rib cage after a fall 1 day ago.  Patient is well-appearing, and is in no acute distress, her vital signs are stable.  X-ray was performed, which did not show any rib fractures or other cardiopulmonary etiology.  Encourage patient to continue use of lidocaine patches, use of ice, and regular coughing and deep breathing while symptoms persist.  Patient was advised that symptoms may take a few weeks to heal, but this is normal with the type of injury she has sustained.  Patient was given strict indications of when to follow-up in the emergency department.  Patient was given the opportunity to ask questions, all questions were answered.  Patient was stable for discharge. Final Clinical Impressions(s) / UC Diagnoses   Final diagnoses:  Rib contusion, right, initial encounter     Discharge Instructions      The x-ray does not show a fracture to any of the ribs on your right side.  As discussed, based on the mechanism of injury, your symptoms are consistent with a bruise to the right rib cage. Continue use of lidocaine patch to help with pain. May also take over-the-counter Tylenol arthritis strength, 1 tablet every 8 hours to help with pain or discomfort. Apply ice to the right rib cage to help with pain and swelling. Recommend coughing regularly every hour and deep breathing to prevent pneumonia. Please be advised that it may take several weeks for your ribs to heal completely.  However, if you develop worsening rib cage pain, shortness of breath, difficulty breathing, or other concerns, please go to the emergency department immediately.     ED Prescriptions   None    PDMP not reviewed this encounter.   Tish Men, NP 10/04/22 1439

## 2022-10-04 NOTE — Discharge Instructions (Signed)
The x-ray does not show a fracture to any of the ribs on your right side.  As discussed, based on the mechanism of injury, your symptoms are consistent with a bruise to the right rib cage. Continue use of lidocaine patch to help with pain. May also take over-the-counter Tylenol arthritis strength, 1 tablet every 8 hours to help with pain or discomfort. Apply ice to the right rib cage to help with pain and swelling. Recommend coughing regularly every hour and deep breathing to prevent pneumonia. Please be advised that it may take several weeks for your ribs to heal completely.  However, if you develop worsening rib cage pain, shortness of breath, difficulty breathing, or other concerns, please go to the emergency department immediately.

## 2022-11-05 ENCOUNTER — Other Ambulatory Visit: Payer: Self-pay | Admitting: Obstetrics and Gynecology

## 2022-11-05 DIAGNOSIS — R928 Other abnormal and inconclusive findings on diagnostic imaging of breast: Secondary | ICD-10-CM

## 2022-11-23 ENCOUNTER — Ambulatory Visit
Admission: RE | Admit: 2022-11-23 | Discharge: 2022-11-23 | Disposition: A | Discharge status: Home / Self Care | Payer: Medicare HMO | Source: Ambulatory Visit | Attending: Obstetrics and Gynecology | Admitting: Obstetrics and Gynecology

## 2022-11-23 ENCOUNTER — Other Ambulatory Visit: Payer: Self-pay | Admitting: Obstetrics and Gynecology

## 2022-11-23 DIAGNOSIS — R928 Other abnormal and inconclusive findings on diagnostic imaging of breast: Secondary | ICD-10-CM

## 2022-12-04 ENCOUNTER — Ambulatory Visit
Admission: RE | Admit: 2022-12-04 | Discharge: 2022-12-04 | Disposition: A | Discharge status: Home / Self Care | Payer: Medicare HMO | Source: Ambulatory Visit | Attending: Obstetrics and Gynecology | Admitting: Obstetrics and Gynecology

## 2022-12-04 DIAGNOSIS — R928 Other abnormal and inconclusive findings on diagnostic imaging of breast: Secondary | ICD-10-CM

## 2022-12-04 HISTORY — PX: BREAST BIOPSY: SHX20

## 2023-11-24 ENCOUNTER — Emergency Department (HOSPITAL_COMMUNITY): Payer: Medicare HMO

## 2023-11-24 ENCOUNTER — Encounter: Payer: Self-pay | Admitting: Emergency Medicine

## 2023-11-24 ENCOUNTER — Other Ambulatory Visit: Payer: Self-pay

## 2023-11-24 ENCOUNTER — Ambulatory Visit
Admission: EM | Admit: 2023-11-24 | Discharge: 2023-11-24 | Disposition: A | Discharge status: Home / Self Care | Payer: Medicare HMO | Attending: Family Medicine | Admitting: Family Medicine

## 2023-11-24 ENCOUNTER — Emergency Department (HOSPITAL_COMMUNITY)
Admission: EM | Admit: 2023-11-24 | Discharge: 2023-11-24 | Disposition: A | Discharge status: Home / Self Care | Payer: Medicare HMO | Attending: Emergency Medicine | Admitting: Emergency Medicine

## 2023-11-24 DIAGNOSIS — I120 Hypertensive chronic kidney disease with stage 5 chronic kidney disease or end stage renal disease: Secondary | ICD-10-CM | POA: Diagnosis not present

## 2023-11-24 DIAGNOSIS — Z7984 Long term (current) use of oral hypoglycemic drugs: Secondary | ICD-10-CM | POA: Insufficient documentation

## 2023-11-24 DIAGNOSIS — E1122 Type 2 diabetes mellitus with diabetic chronic kidney disease: Secondary | ICD-10-CM | POA: Insufficient documentation

## 2023-11-24 DIAGNOSIS — Z794 Long term (current) use of insulin: Secondary | ICD-10-CM | POA: Insufficient documentation

## 2023-11-24 DIAGNOSIS — M79602 Pain in left arm: Secondary | ICD-10-CM | POA: Diagnosis not present

## 2023-11-24 DIAGNOSIS — Z79899 Other long term (current) drug therapy: Secondary | ICD-10-CM | POA: Insufficient documentation

## 2023-11-24 DIAGNOSIS — R9431 Abnormal electrocardiogram [ECG] [EKG]: Secondary | ICD-10-CM

## 2023-11-24 DIAGNOSIS — R079 Chest pain, unspecified: Secondary | ICD-10-CM | POA: Insufficient documentation

## 2023-11-24 DIAGNOSIS — N186 End stage renal disease: Secondary | ICD-10-CM | POA: Diagnosis not present

## 2023-11-24 DIAGNOSIS — E119 Type 2 diabetes mellitus without complications: Secondary | ICD-10-CM

## 2023-11-24 DIAGNOSIS — I10 Essential (primary) hypertension: Secondary | ICD-10-CM

## 2023-11-24 LAB — CBC
HCT: 41.3 % (ref 36.0–46.0)
Hemoglobin: 12.6 g/dL (ref 12.0–15.0)
MCH: 26.4 pg (ref 26.0–34.0)
MCHC: 30.5 g/dL (ref 30.0–36.0)
MCV: 86.6 fL (ref 80.0–100.0)
Platelets: 207 10*3/uL (ref 150–400)
RBC: 4.77 MIL/uL (ref 3.87–5.11)
RDW: 13.4 % (ref 11.5–15.5)
WBC: 4.3 10*3/uL (ref 4.0–10.5)
nRBC: 0 % (ref 0.0–0.2)

## 2023-11-24 LAB — TROPONIN I (HIGH SENSITIVITY)
Troponin I (High Sensitivity): 8 ng/L (ref <18)
Troponin I (High Sensitivity): 6 ng/L (ref <18)

## 2023-11-24 LAB — BASIC METABOLIC PANEL
Anion gap: 13 (ref 5–15)
BUN: 26 mg/dL — ABNORMAL HIGH (ref 6–20)
CO2: 24 mmol/L (ref 22–32)
Calcium: 9.7 mg/dL (ref 8.9–10.3)
Chloride: 102 mmol/L (ref 98–111)
Creatinine, Ser: 1.59 mg/dL — ABNORMAL HIGH (ref 0.44–1.00)
GFR, Estimated: 40 mL/min — ABNORMAL LOW (ref >60)
Glucose, Bld: 77 mg/dL (ref 70–99)
Potassium: 3.4 mmol/L — ABNORMAL LOW (ref 3.5–5.1)
Sodium: 139 mmol/L (ref 135–145)

## 2023-11-24 LAB — D-DIMER, QUANTITATIVE: D-Dimer, Quant: 0.59 ug{FEU}/mL — ABNORMAL HIGH (ref 0.00–0.50)

## 2023-11-24 MED ORDER — ACETAMINOPHEN 500 MG PO TABS
1000.0000 mg | ORAL_TABLET | Freq: Once | ORAL | Status: AC
  Administered 2023-11-24: 1000 mg via ORAL
  Filled 2023-11-24: qty 2

## 2023-11-24 MED ORDER — ASPIRIN 81 MG PO CHEW
162.0000 mg | CHEWABLE_TABLET | Freq: Once | ORAL | Status: AC
  Administered 2023-11-24: 162 mg via ORAL
  Filled 2023-11-24: qty 2

## 2023-11-24 MED ORDER — OXYCODONE HCL 5 MG PO TABS: 5.0000 mg | ORAL_TABLET | ORAL | Status: DC

## 2023-11-24 MED ORDER — TECHNETIUM TO 99M ALBUMIN AGGREGATED
4.4000 | Freq: Once | INTRAVENOUS | Status: AC | PRN
  Administered 2023-11-24: 4.4 via INTRAVENOUS

## 2023-11-24 MED ORDER — HYDROMORPHONE HCL 1 MG/ML IJ SOLN
0.5000 mg | Freq: Once | INTRAMUSCULAR | Status: AC
  Administered 2023-11-24: 0.5 mg via INTRAVENOUS
  Filled 2023-11-24: qty 0.5

## 2023-11-24 MED ORDER — NITROGLYCERIN 0.4 MG SL SUBL
0.4000 mg | SUBLINGUAL_TABLET | SUBLINGUAL | Status: DC | PRN
  Administered 2023-11-24: 0.4 mg via SUBLINGUAL
  Filled 2023-11-24: qty 1

## 2023-11-24 MED ORDER — OXYCODONE HCL 10 MG PO TABS: 10.0000 mg | ORAL_TABLET | Freq: Three times a day (TID) | ORAL | 0 refills | Status: AC | PRN

## 2023-11-24 MED ORDER — HYDROMORPHONE HCL 1 MG/ML IJ SOLN: 0.5000 mg | Freq: Once | INTRAMUSCULAR | Status: DC

## 2023-11-24 NOTE — Discharge Instructions (Signed)
You were seen for your chest pain in the emergency department.   At home, please take Tylenol and oxycodone for your pain.    Follow-up with your primary doctor in 2-3 days regarding your visit.  Cardiology will be calling you regarding an appointment within the next 72 hours.  You may contact them if you do not hear from them in that time using the information in this packet.  Return immediately to the emergency department if you experience any of the following: Worsening pain, difficulty breathing, unexplained vomiting or sweating, or any other concerning symptoms.    Thank you for visiting our Emergency Department. It was a pleasure taking care of you today.

## 2023-11-24 NOTE — ED Notes (Addendum)
CBG post EKG and per dexcom 109. PA aware.  EKG x2 completed by CMA per PA request.

## 2023-11-24 NOTE — ED Notes (Signed)
Patient is being discharged from the Urgent Care and sent to the Emergency Department via POV . Per provider, patient is in need of higher level of care due to abnormal EKG. Patient is aware and verbalizes understanding of plan of care.  Vitals:   11/24/23 1003  BP: 137/74  Pulse: 72  Resp: 20  Temp: 98.1 F (36.7 C)  SpO2: 95%

## 2023-11-24 NOTE — ED Notes (Signed)
Difficulties with IV access. Pt requested labs be drawn with IV. USGPIV will be attempted.

## 2023-11-24 NOTE — ED Triage Notes (Addendum)
Pt reports right upper arm/shoulder pain that started last night and reports woke up this am and is now having left sided neck/shoulder/breast pain. Pt also reports bilateral hand numbness/pain that is worse on the left.   Pt reports wears dexcom and reports CBG is 54. Pt reports ate crackers pta at [facility].   PA consulted and given verbal order for EKG.   Pt driven by spouse. Pt spouse at bedside.

## 2023-11-24 NOTE — ED Notes (Signed)
Pt now reporting pain in her back that radiates from her chest. Pt localized pain to the upper thoracic area.

## 2023-11-24 NOTE — ED Provider Notes (Signed)
[place] EMERGENCY DEPARTMENT AT [facility] Provider Note   CSN: [id number] Arrival date & time: 11/24/23  1030     History  Chief Complaint  Patient presents with   Chest Pain    [name] [name] is a 47 y.o. female.  47 year old female with a history of diabetes, hypertension, hyperlipidemia, chronic pain, ESRD status post renal transplant who presents to the emergency department with chest discomfort.  Patient reports that she has chest discomfort that she describes as a tingling sensation that is substernal and radiates up her left neck and down her left arm.  Says that it may change with exertion but is not completely sure.  Also is made better by laying down.  Does change with breathing.  No weakness or numbness elsewhere.  No personal history of stents or MI.  Went to urgent care and had an EKG with nonspecific T wave changes and was referred to the emergency department.  Says it is currently 7/10 in severity.       Home Medications Prior to Admission medications   Medication Sig Start Date End Date Taking? Authorizing Provider  calcitRIOL (ROCALTROL) 0.5 MCG capsule Take 2 capsules by mouth 2 (two) times daily. 02/16/08  Yes [provider]  hydrOXYzine (VISTARIL) 50 MG capsule Take 1 capsule by mouth at bedtime. 11/27/19  Yes [provider]  tiZANidine (ZANAFLEX) 4 MG tablet Take 4 mg by mouth 2 (two) times daily as needed. 07/05/23  Yes [provider]  acetaminophen (TYLENOL 8 HOUR) 650 MG CR tablet Take 1 tablet (650 mg total) by mouth every 8 (eight) hours. 05/08/18   Derwood Kaplan, MD  acyclovir ointment (ZOVIRAX) 5 % Apply 1 application topically 3 (three) times daily as needed (outbreak).     [provider]  albuterol (PROAIR HFA) 108 (90 Base) MCG/ACT inhaler Inhale 2 puffs into the lungs every 4 (four) hours as needed for wheezing or shortness of breath. 04/11/17   Alfonse Spruce, MD  ALPRAZolam Prudy Feeler)  0.5 MG tablet Take 0.5 mg by mouth 2 (two) times daily.     [provider]  amitriptyline (ELAVIL) 10 MG tablet Take 10 mg by mouth at bedtime.    [provider]  amoxicillin-clavulanate (AUGMENTIN) 875-125 MG tablet Take 1 tablet by mouth 2 (two) times daily. One po bid x 7 days 07/04/18   Pricilla Loveless, MD  ARIPiprazole (ABILIFY) 10 MG tablet Take 10 mg by mouth daily. 10/03/23   [provider]  baclofen (LIORESAL) 10 MG tablet Take 10 mg by mouth 2 (two) times daily.    [provider]  buprenorphine (BUTRANS) 15 MCG/HR Place 1 patch onto the skin once a week.    [provider]  busPIRone (BUSPAR) 15 MG tablet Take 15 mg by mouth 3 (three) times daily.    [provider]  calcium carbonate (TUMS) 500 MG chewable tablet Chew 1-2 tablets by mouth daily as needed for heartburn.  04/04/12   [provider]  cephALEXin (KEFLEX) 500 MG capsule Take 1 capsule (500 mg total) by mouth 3 (three) times daily. 12/10/18   Bethann Berkshire, MD  cyanocobalamin (VITAMIN B12) 1000 MCG/ML injection Inject 1,000 mcg into the muscle every 14 (fourteen) days. 12/09/19   [provider]  cyclobenzaprine (FLEXERIL) 10 MG tablet Take 10 mg by mouth 2 (two) times daily as needed for muscle spasms.  05/28/17   [provider]  dicyclomine (BENTYL) 10 MG capsule Take 20  mg by mouth 3 (three) times daily as needed for spasms.     [provider]  doxepin (SINEQUAN) 10 MG capsule Take 10 mg by mouth at bedtime. 04/03/18 04/03/19  [provider]  doxycycline (VIBRAMYCIN) 100 MG capsule Take 1 capsule (100 mg total) by mouth 2 (two) times daily. 07/07/18   Benjiman Core, MD  EPINEPHrine 0.3 mg/0.3 mL IJ SOAJ injection Use as directed, if administer call 911 02/08/17   [provider]  famotidine (PEPCID) 40 MG tablet Take 40 mg by mouth 2 (two) times daily.    [provider]  FLUoxetine (PROZAC) 40 MG capsule  Take 40 mg by mouth 2 (two) times daily. 07/23/17   [provider]  gabapentin (NEURONTIN) 100 MG capsule Take 1 capsule (100 mg total) by mouth 2 (two) times daily. 11/26/20   Avegno, Zachery Dakins, FNP  hydrocortisone 2.5 % ointment Apply 1 application topically every 6 (six) hours as needed (for irritation).     [provider]  hydrOXYzine (ATARAX/VISTARIL) 50 MG tablet Take 50 mg by mouth at bedtime.     [provider]  levocetirizine (XYZAL) 5 MG tablet Take 5 mg by mouth every evening.    [provider]  loratadine (CLARITIN) 10 MG tablet Take 10 mg by mouth every morning.     [provider]  magic mouthwash SOLN Take 5 mLs by mouth every 4 (four) hours as needed for mouth pain.  04/12/16   [provider]  metFORMIN (GLUCOPHAGE-XR) 500 MG 24 hr tablet Take 1,000 mg by mouth 2 (two) times daily.    [provider]  metoCLOPramide (REGLAN) 10 MG tablet Take 10 mg by mouth 3 (three) times daily as needed for nausea.    [provider]  montelukast (SINGULAIR) 10 MG tablet Take 5 mg by mouth at bedtime.    [provider]  mycophenolate (MYFORTIC) 180 MG EC tablet Take 180 mg by mouth 2 (two) times daily.    [provider]  nitrofurantoin, macrocrystal-monohydrate, (MACROBID) 100 MG capsule Take 1 capsule (100 mg total) by mouth 2 (two) times daily. 06/09/21   Merrilee Jansky, MD  omeprazole (PRILOSEC) 40 MG capsule Take 40 mg by mouth 2 (two) times daily.     [provider]  ondansetron (ZOFRAN) 8 MG tablet Take by mouth every 8 (eight) hours as needed for nausea or vomiting.    [provider]  ondansetron (ZOFRAN-ODT) 4 MG disintegrating tablet Take 4 mg by mouth every 8 (eight) hours.    [provider]  Oxycodone HCl 10 MG TABS Take 1 tablet (10 mg total) by mouth 3 (three) times daily as needed for up to 10 doses. 11/24/23   Rondel Baton, MD  OZEMPIC, 1 MG/DOSE, 4  MG/3ML SOPN Inject 1 mg into the skin once a week.    [provider]  predniSONE (DELTASONE) 5 MG tablet Take 5 mg by mouth daily with breakfast. 06/27/19   [provider]  promethazine (PHENERGAN) 25 MG tablet Take 1 tablet (25 mg total) by mouth every 6 (six) hours as needed for nausea. 06/11/17   Benjiman Core, MD  rosuvastatin (CRESTOR) 5 MG tablet Take 5 mg by mouth at bedtime.    [provider]  sucralfate (CARAFATE) 1 g tablet Take 1 g by mouth 4 (four) times daily -  with meals and at bedtime.    [provider]  tacrolimus (PROGRAF) 1 MG capsule Take 3  mg by mouth 2 (two) times daily.    [provider]  tacrolimus (PROGRAF) 1 MG capsule Take 1 mg by mouth 2 (two) times daily.    [provider]  valACYclovir (VALTREX) 500 MG tablet Take 500-1,000 mg by mouth See admin instructions. 2 on the first day then 1 tab day 2-5    [provider]      Allergies    Barium-containing compounds, Hydromorphone, Iodinated contrast media, Shellfish allergy, Vancomycin, Aspirin, Tape, Infed [iron dextran], Sulfa antibiotics, and Iopamidol    Review of Systems   Review of Systems  Physical Exam Updated Vital Signs BP 115/82 (BP Location: Right Arm)   Pulse 65   Temp 98.1 F (36.7 C) (Oral)   Resp 18   Ht 5\' 2"  (1.575 m)   Wt 62.6 kg   LMP 01/30/2012   SpO2 95%   BMI 25.24 kg/m  Physical Exam Vitals and nursing note reviewed.  Constitutional:      General: She is not in acute distress.    Appearance: She is well-developed.  HENT:     Head: Normocephalic and atraumatic.     Right Ear: External ear normal.     Left Ear: External ear normal.     Nose: Nose normal.  Eyes:     Extraocular Movements: Extraocular movements intact.     Conjunctiva/sclera: Conjunctivae normal.     Pupils: Pupils are equal, round, and reactive to light.  Cardiovascular:     Rate and Rhythm: Normal rate and regular rhythm.     Heart sounds:  No murmur heard.    Comments: Pain appears to be reproducible.  Fistula in right upper extremity.  And thrill.  Radial pulses 2+ bilaterally. Pulmonary:     Effort: Pulmonary effort is normal. No respiratory distress.     Breath sounds: Normal breath sounds.  Musculoskeletal:     Cervical back: Normal range of motion and neck supple.     Right lower leg: No edema.     Left lower leg: No edema.  Skin:    General: Skin is warm and dry.  Neurological:     Mental Status: She is alert and oriented to person, place, and time. Mental status is at baseline.  Psychiatric:        Mood and Affect: Mood normal.     ED Results / Procedures / Treatments   Labs (all labs ordered are listed, but only abnormal results are displayed) Labs Reviewed  BASIC METABOLIC PANEL - Abnormal; Notable for the following components:      Result Value   Potassium 3.4 (*)    BUN 26 (*)    Creatinine, Ser 1.59 (*)    GFR, Estimated 40 (*)    All other components within normal limits  D-DIMER, QUANTITATIVE - Abnormal; Notable for the following components:   D-Dimer, Quant 0.59 (*)    All other components within normal limits  CBC  TROPONIN I (HIGH SENSITIVITY)  TROPONIN I (HIGH SENSITIVITY)    EKG EKG Interpretation Date/Time:  Sunday November 24 2023 10:48:37 EST Ventricular Rate:  62 PR Interval:  160 QRS Duration:  85 QT Interval:  446 QTC Calculation: 453 R Axis:   48  Text Interpretation: Sinus rhythm ST elev with mild PR depression consider pericarditis or early repol No significant change since last tracing 11/24/23 Confirmed by Vonita Moss [phone]) on 11/24/2023 11:07:07 AM  Radiology NM Pulmonary Perfusion  Result Date: 11/24/2023 CLINICAL DATA:  Chest pain EXAM:  NUCLEAR MEDICINE PERFUSION LUNG SCAN TECHNIQUE: Perfusion images were obtained in multiple projections after intravenous injection of radiopharmaceutical. Ventilation scans intentionally deferred if perfusion scan and chest x-ray  adequate for interpretation during COVID 19 epidemic. RADIOPHARMACEUTICALS:  4.4 mCi Tc-75m MAA IV COMPARISON:  X-ray earlier 11/24/2019 FINDINGS: Normal distribution radiotracer on perfusion lung scan. No significant defects. IMPRESSION: Negative perfusion lung scan for PE Electronically Signed   By: Karen Kays M.D.   On: 11/24/2023 15:34   DG Chest 2 View  Result Date: 11/24/2023 CLINICAL DATA:  Chest pain radiating to left neck and shoulder. EXAM: CHEST - 2 VIEW COMPARISON:  10/04/2022 FINDINGS: The heart size and mediastinal contours are within normal limits. Surgical staples again seen in the upper right paratracheal region. Both lungs are clear. The visualized skeletal structures are unremarkable. IMPRESSION: No active cardiopulmonary disease. Electronically Signed   By: Danae Orleans M.D.   On: 11/24/2023 12:26    Procedures Procedures    Medications Ordered in ED Medications  aspirin chewable tablet 162 mg (162 mg Oral Given 11/24/23 1235)  HYDROmorphone (DILAUDID) injection 0.5 mg (0.5 mg Intravenous Given 11/24/23 1329)  technetium albumin aggregated (MAA) injection solution 4.4 millicurie (4.4 millicuries Intravenous Contrast Given 11/24/23 1450)  acetaminophen (TYLENOL) tablet 1,000 mg (1,000 mg Oral Given 11/24/23 1625)    ED Course/ Medical Decision Making/ A&P                                 Medical Decision Making Amount and/or Complexity of Data Reviewed Labs: ordered. Radiology: ordered.  Risk OTC drugs. Prescription drug management.   [name] is a 46 y.o. female with comorbidities that complicate the patient evaluation including diabetes, hypertension, hyperlipidemia, chronic pain, ESRD status post renal transplant who presents to the emergency department with chest discomfort.    Initial Ddx:  I would like dissection, radiculopathy, MSK pain  MDM/Course:  Patient presents to the emergency department with chest discomfort that radiates up her neck  and arm.  Was sent from urgent care due to EKG that shows some nonspecific T wave changes.  On my review does appear to show some ST elevations are concerning for possible early repolarization.  History is not typical for ACS so troponins were also sent which were WNL without any significant change.  Had a D-dimer that was elevated.  Due to her renal function she unfortunately cannot get a CTA dissection study or PE study so she did have a VQ scan that did not show evidence of PE.  After reviewing her imaging no widened mediastinum was not complaining of pain radiating to the back that is ripping or tearing so feel that dissection less likely.  Given the tingling sensation could potentially be radicular pain.  We are somewhat limited in the medications we can give her because of her transplant.  Will have her take some oxycodone and follow-up with her primary doctor and cardiology for continued evaluation.  This patient presents to the ED for concern of complaints listed in HPI, this involves an extensive number of treatment options, and is a complaint that carries with it a high risk of complications and morbidity. Disposition including potential need for admission considered.   Dispo: DC Home. Return precautions discussed including, but not limited to, those listed in the AVS. Allowed pt time to ask questions which were answered fully prior to dc.  Additional history obtained from spouse Records  reviewed Outpatient Clinic Notes The following labs were independently interpreted: Chemistry and show CKD I independently reviewed the following imaging with scope of interpretation limited to determining acute life threatening conditions related to emergency care: Chest x-ray and agree with the radiologist interpretation with the following exceptions: none I personally reviewed and interpreted cardiac monitoring: normal sinus rhythm  I personally reviewed and interpreted the pt's EKG: see above for  interpretation  I have reviewed the patients home medications and made adjustments as needed  Portions of this note were generated with Dragon dictation software. Dictation errors may occur despite best attempts at proofreading.           Final Clinical Impression(s) / ED Diagnoses Final diagnoses:  Chest pain, unspecified type    Rx / DC Orders ED Discharge Orders          Ordered    Oxycodone HCl 10 MG TABS  3 times daily PRN        11/24/23 1553    Ambulatory referral to Cardiology        11/24/23 1554              Rondel Baton, MD 11/24/23 1740

## 2023-11-24 NOTE — ED Provider Notes (Signed)
[facility]    CSN: [id number] Arrival date & time: 11/24/23  4098      History   Chief Complaint Chief Complaint  Patient presents with   Shoulder Pain    HPI [name] is a 47 y.o. female.   Patient with past medical history of type 2 diabetes, hypertension, hyperlipidemia, end-stage renal disease status post kidney transplant, agoraphobia with panic attacks, depression, Crohn's disease presenting today with new onset what started as right shoulder aching but is now left-sided arm pain, tingling extending up to neck, left chest pain that is sharp but also tingling and pressure, nausea.  Denies shortness of breath, palpitations, dizziness, diaphoresis, vomiting, injury to the area, pain worsening with movement or deep breaths.  Denies past history of similar pains.  So far not trying anything for symptoms.  When asked, she denies having any cardiac issues.  Patient noted her blood sugar to be 54 upon arrival, ate some crackers at time of arrival and notes that her blood sugar is now 109.  He states she is compliant with her medications.  Does have a family history of CVA and MI in her grandparents.  Non-smoker.    Past Medical History:  Diagnosis Date   Anxiety    OCD   Asthma    as a child   Cancer (HCC) 2001   renal cell , breast cancer both breast 2011, cervical cancer 2013   Chronic anemia    Chronic pain    Complication of anesthesia    hard to sedate dr hung aware   Crohn's disease (HCC)    Diabetes mellitus without complication (HCC)    Fracture 05/01/2013   Right foot, in cast   History of blood transfusion 2 years ago   Hyperlipidemia    Hypothyroidism    parathroid runs low   Meningioma (HCC)    Panic disorder    PONV (postoperative nausea and vomiting)    Renal disorder    S/p '11-Kidney transplant-Dr. Lowell Guitar follows   Urticaria     Patient Active Problem List   Diagnosis Date Noted   Lower urinary tract infectious disease  11/20/2013   CKD (chronic kidney disease) stage 3, GFR 30-59 ml/min (HCC) 08/25/2013   Perirectal abscess 08/24/2013   ESRD (end stage renal disease) (HCC) 03/27/2011   Kidney transplant status, living unrelated donor 03/27/2011   Anemia 03/27/2011   Type 2 diabetes mellitus (HCC) 03/27/2011   Osteoarthritis of right knee 03/27/2011   Lumbago 03/27/2011   HTN (hypertension) 03/27/2011   HLD (hyperlipidemia) 03/27/2011   Generalized anxiety disorder 03/27/2011   Depression 03/27/2011   Chronic steroid use 03/27/2011   Crohn's disease (HCC) 03/27/2011   OCD (obsessive compulsive disorder) 03/27/2011   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   Obstructive sleep apnea, adult 03/27/2011    Past Surgical History:  Procedure Laterality Date   ABDOMINAL HYSTERECTOMY  02/13/2012   Procedure: HYSTERECTOMY ABDOMINAL;  Surgeon: Jeani Hawking, MD;  Location: [facility];  Service: Gynecology;  Laterality: N/A;   BRAVO PH STUDY N/A 11/27/2013   Procedure: BRAVO PH STUDY;  Surgeon: Theda Belfast, MD;  Location: [facility];  Service: Endoscopy;  Laterality: N/A;   BRAVO PH STUDY N/A 12/11/2013   Procedure: BRAVO PH STUDY;  Surgeon: Theda Belfast, MD;  Location: [facility];  Service: Endoscopy;  Laterality: N/A;   BREAST BIOPSY Left 12/04/2022   [place] LT BREAST BX W LOC DEV 1ST LESION IMG BX SPEC US  GUIDE 12/04/2022 [facility]   breast lumpectomy and reconstruction Bilateral 2011   BREAST SURGERY     bilateral lumpectomy 4'11   COLONOSCOPY N/A 09/25/2013   Procedure: COLONOSCOPY;  Surgeon: Theda Belfast, MD;  Location: [facility];  Service: Endoscopy;  Laterality: N/A;   COLONOSCOPY WITH PROPOFOL N/A 07/29/2015   Procedure: COLONOSCOPY WITH PROPOFOL;  Surgeon: Jeani Hawking, MD;  Location: [facility];  Service: Endoscopy;  Laterality: N/A;   CYSTOSCOPY  02/13/2012   Procedure: CYSTOSCOPY;  Surgeon: Marcine Matar, MD;  Location: [facility];  Service: Urology;  Laterality: N/A;   insertion of ureteral catheter , removed per Dr Retta Diones    ESOPHAGEAL MANOMETRY N/A 08/10/2013   Procedure: ESOPHAGEAL MANOMETRY (EM);  Surgeon: Theda Belfast, MD;  Location: [facility];  Service: Endoscopy;  Laterality: N/A;   ESOPHAGOGASTRODUODENOSCOPY N/A 09/25/2013   Procedure: ESOPHAGOGASTRODUODENOSCOPY (EGD);  Surgeon: Theda Belfast, MD;  Location: [facility];  Service: Endoscopy;  Laterality: N/A;   ESOPHAGOGASTRODUODENOSCOPY N/A 11/27/2013   Procedure: ESOPHAGOGASTRODUODENOSCOPY (EGD);  Surgeon: Theda Belfast, MD;  Location: [facility];  Service: Endoscopy;  Laterality: N/A;   ESOPHAGOGASTRODUODENOSCOPY N/A 12/11/2013   Procedure: ESOPHAGOGASTRODUODENOSCOPY (EGD);  Surgeon: Theda Belfast, MD;  Location: [facility];  Service: Endoscopy;  Laterality: N/A;   GROIN DISSECTION Left 10/09/2013   Procedure: INCISION AND DRAINAGE OF LEFT GROIN ABSCESS;  Surgeon: Kandis Cocking, MD;  Location: [facility];  Service: General;  Laterality: Left;   INCISION AND DRAINAGE ABSCESS N/A 08/26/2013   Procedure: INCISION AND DRAINAGE PERIRECTAL ABSCESS;  Surgeon: Dalia Heading, MD;  Location: [facility];  Service: General;  Laterality: N/A;   KNEE SURGERY Left april 2003   arthroscopy   LOWER EXTREMITY ARTERIAL DOPLLERS  05/06/2012   Bilat ABIs were attempted-not accessible due to calcified veins. Bilat TBIs demonstrated no obtainable flow through both great toes. Bilat PVR ankle regions demonstrated moderately abnormal pulsatile flow. Lft SFA demonstrated a 50-69% diameter reduction. Lft runoff demonstrated occlusive diseasewith reconstitution of flow noted distally.   NEPHRECTOMY TRANSPLANTED ORGAN  1999 and 2011   last '11-[facility]   PARATHYROIDECTOMY  02-20-2001   SLEEVE GASTROPLASTY     for GERD, hiatal hernia   TUBAL LIGATION  02-19-2000    OB History     Gravida  1   Para      Term      Preterm      AB  1   Living  0      SAB  1   IAB      Ectopic      Multiple      Live  Births               Home Medications    Prior to Admission medications   Medication Sig Start Date End Date Taking? Authorizing Provider  acetaminophen (TYLENOL 8 HOUR) 650 MG CR tablet Take 1 tablet (650 mg total) by mouth every 8 (eight) hours. 05/08/18   Derwood Kaplan, MD  acyclovir ointment (ZOVIRAX) 5 % Apply 1 application topically 3 (three) times daily as needed (outbreak).     [provider]  albuterol (PROAIR HFA) 108 (90 Base) MCG/ACT inhaler Inhale 2 puffs into the lungs every 4 (four) hours as needed for wheezing or shortness of breath. 04/11/17   Alfonse Spruce, MD  ALPRAZolam Prudy Feeler) 0.5 MG tablet Take 0.5 mg by mouth 2 (two) times daily.     [provider]  amitriptyline (ELAVIL) 10 MG  tablet Take 10 mg by mouth at bedtime.    [provider]  amoxicillin-clavulanate (AUGMENTIN) 875-125 MG tablet Take 1 tablet by mouth 2 (two) times daily. One po bid x 7 days 07/04/18   Pricilla Loveless, MD  busPIRone (BUSPAR) 15 MG tablet Take 15 mg by mouth 3 (three) times daily.    [provider]  calcitRIOL (ROCALTROL) 0.25 MCG capsule Take 4 capsules by mouth 2 (two) times daily. 04/04/12   [provider]  calcium carbonate (TUMS) 500 MG chewable tablet Chew 1-2 tablets by mouth daily as needed for heartburn.  04/04/12   [provider]  cephALEXin (KEFLEX) 500 MG capsule Take 1 capsule (500 mg total) by mouth 3 (three) times daily. 12/10/18   Bethann Berkshire, MD  cyclobenzaprine (FLEXERIL) 10 MG tablet Take 10 mg by mouth 2 (two) times daily as needed for muscle spasms.  05/28/17   [provider]  dicyclomine (BENTYL) 10 MG capsule Take 20 mg by mouth 3 (three) times daily as needed for spasms.     [provider]  doxepin (SINEQUAN) 10 MG capsule Take 10 mg by mouth at bedtime. 04/03/18 04/03/19  [provider]  doxycycline (VIBRAMYCIN) 100 MG capsule Take 1 capsule (100 mg total) by mouth 2 (two)  times daily. 07/07/18   Benjiman Core, MD  EPINEPHrine 0.3 mg/0.3 mL IJ SOAJ injection Use as directed, if administer call 911 02/08/17   [provider]  FLUoxetine (PROZAC) 20 MG capsule Take 60 mg by mouth daily.     [provider]  gabapentin (NEURONTIN) 100 MG capsule Take 1 capsule (100 mg total) by mouth 2 (two) times daily. 11/26/20   Avegno, Zachery Dakins, FNP  hydrocortisone 2.5 % ointment Apply 1 application topically every 6 (six) hours as needed (for irritation).     [provider]  hydrOXYzine (ATARAX/VISTARIL) 50 MG tablet Take 50 mg by mouth at bedtime.     [provider]  levocetirizine (XYZAL) 5 MG tablet Take 5 mg by mouth every evening.    [provider]  loratadine (CLARITIN) 10 MG tablet Take 10 mg by mouth every morning.     [provider]  magic mouthwash SOLN Take 5 mLs by mouth every 4 (four) hours as needed for mouth pain.  04/12/16   [provider]  metFORMIN (GLUCOPHAGE-XR) 500 MG 24 hr tablet Take 1,000 mg by mouth 2 (two) times daily.    [provider]  metoCLOPramide (REGLAN) 10 MG tablet Take 10 mg by mouth 3 (three) times daily as needed for nausea.    [provider]  montelukast (SINGULAIR) 10 MG tablet Take 5 mg by mouth at bedtime.    [provider]  mycophenolate (MYFORTIC) 180 MG EC tablet Take 540 mg by mouth 2 (two) times daily.     Thermon Leyland, NP  mycophenolate (MYFORTIC) 180 MG EC tablet Take 180 mg by mouth 2 (two) times daily.    [provider]  nitrofurantoin, macrocrystal-monohydrate, (MACROBID) 100 MG capsule Take 1 capsule (100 mg total) by mouth 2 (two) times daily. 06/09/21   Merrilee Jansky, MD  omeprazole (PRILOSEC) 40 MG capsule Take 40 mg by mouth 2 (two) times daily.     [provider]  ondansetron (ZOFRAN) 8 MG tablet Take by mouth every 8 (eight) hours as needed for nausea or vomiting.    [provider]  Oxycodone  HCl 10 MG TABS Take 10 mg by mouth  3 (three) times daily as needed for severe pain.     [provider]  predniSONE (DELTASONE) 10 MG tablet Take 10 mg by mouth daily with breakfast.    [provider]  promethazine (PHENERGAN) 25 MG tablet Take 1 tablet (25 mg total) by mouth every 6 (six) hours as needed for nausea. 06/11/17   Benjiman Core, MD  rosuvastatin (CRESTOR) 5 MG tablet Take 5 mg by mouth at bedtime.    [provider]  sucralfate (CARAFATE) 1 g tablet Take 1 g by mouth 4 (four) times daily -  with meals and at bedtime.    [provider]  tacrolimus (PROGRAF) 1 MG capsule Take 3 mg by mouth 2 (two) times daily.    [provider]  tacrolimus (PROGRAF) 1 MG capsule Take 1 mg by mouth 2 (two) times daily.    [provider]  valACYclovir (VALTREX) 500 MG tablet Take 500-1,000 mg by mouth See admin instructions. 2 on the first day then 1 tab day 2-5    [provider]    Family History Family History  Problem Relation Age of Onset   Diabetes Mother    Hypertension Mother    Asthma Mother    Diabetes Father    Hypertension Father    Cancer Father        Prostate cancer   Asthma Father    Obesity Sister    Eczema Sister    Stroke Maternal Grandmother    Heart attack Maternal Grandfather    Polycystic ovary syndrome Sister     Social History Social History   Tobacco Use   Smoking status: Never   Smokeless tobacco: Never  Substance Use Topics   Alcohol use: No   Drug use: No     Allergies   Barium-containing compounds, Ivp dye [iodinated contrast media], Shellfish allergy, Vancomycin, Aspirin, Adhesive [tape], Infed [iron dextran], Sulfa antibiotics, and Iopamidol   Review of Systems Review of Systems Per HPI  Physical Exam Triage Vital Signs ED Triage Vitals  Encounter Vitals Group     BP 11/24/23 1003 137/74     Systolic BP Percentile --      Diastolic BP Percentile --      Pulse Rate  11/24/23 1003 72     Resp 11/24/23 1003 20     Temp 11/24/23 1003 98.1 F (36.7 C)     Temp Source 11/24/23 1003 Oral     SpO2 11/24/23 1003 95 %     Weight --      Height --      Head Circumference --      Peak Flow --      Pain Score 11/24/23 1004 4     Pain Loc --      Pain Education --      Exclude from Growth Chart --    No data found.  Updated Vital Signs BP 137/74 (BP Location: Right Arm)   Pulse 72   Temp 98.1 F (36.7 C) (Oral)   Resp 20   LMP 01/30/2012   SpO2 95%   Visual Acuity Right Eye Distance:   Left Eye Distance:   Bilateral Distance:    Right Eye Near:   Left Eye Near:    Bilateral Near:     Physical Exam Vitals and nursing note reviewed.  Constitutional:      Appearance: Normal appearance. She is not ill-appearing.  HENT:     Head: Atraumatic.  Eyes:  Extraocular Movements: Extraocular movements intact.     Conjunctiva/sclera: Conjunctivae normal.  Cardiovascular:     Rate and Rhythm: Normal rate and regular rhythm.     Heart sounds: Normal heart sounds.  Pulmonary:     Effort: Pulmonary effort is normal.     Breath sounds: Normal breath sounds.  Musculoskeletal:        General: No swelling or tenderness. Normal range of motion.     Cervical back: Normal range of motion and neck supple.  Skin:    General: Skin is warm and dry.  Neurological:     Mental Status: She is alert and oriented to person, place, and time.     Motor: No weakness.     Gait: Gait normal.     Comments: Bilateral upper extremities neurovascularly intact  Psychiatric:        Mood and Affect: Mood normal.        Thought Content: Thought content normal.        Judgment: Judgment normal.      UC Treatments / Results  Labs (all labs ordered are listed, but only abnormal results are displayed) Labs Reviewed - No data to display  EKG   Radiology No results found.  Procedures Procedures (including critical care time)  Medications Ordered in  UC Medications - No data to display  Initial Impression / Assessment and Plan / UC Course  I have reviewed the triage vital signs and the nursing notes.  Pertinent labs & imaging results that were available during my care of the patient were reviewed by me and considered in my medical decision making (see chart for details).     Vital signs within normal limits today and she is overall well-appearing and in no acute distress.  Her EKG today shows normal sinus rhythm at 69 bpm with nonspecific ST elevations, which is new compared to prior EKG performed in December 2021 per chart review.  Given her extensive risk factors for cardiac disease and nonspecific EKG changes recommended she go to the emergency department for further evaluation of her symptoms.  She is agreeable, declines EMS transport today and wishes for her husband to drive her private vehicle.  She is hemodynamically stable for private vehicle transport at this time.  Upon leaving the exam room, she notes that she has nitroglycerin at home but has not taken 1.  This was not included on her medication list today but upon further chart review she was worked up by cardiologist earlier this year for syncopal episodes, heart murmur and carotid bruit as well as atypical chest pain which is why she has the nitroglycerin.  Again strongly recommended immediate workup in the emergency department which she is agreeable to.  Final Clinical Impressions(s) / UC Diagnoses   Final diagnoses:  Left-sided chest pain  Left arm pain  Nonspecific abnormal electrocardiogram (ECG) (EKG)  Essential hypertension  Type 2 diabetes mellitus without complication, without long-term current use of insulin [facility])   Discharge Instructions   None    ED Prescriptions   None    PDMP not reviewed this encounter.   Particia Nearing, [place] 11/24/23 1047

## 2023-11-24 NOTE — ED Triage Notes (Signed)
Pt arrived POV sent from UC with c/o chest pain and bilateral arm and hand pain that started this morning.

## 2024-05-30 ENCOUNTER — Emergency Department (HOSPITAL_COMMUNITY)

## 2024-05-30 ENCOUNTER — Encounter (HOSPITAL_COMMUNITY): Payer: Self-pay | Admitting: Emergency Medicine

## 2024-05-30 ENCOUNTER — Other Ambulatory Visit: Payer: Self-pay

## 2024-05-30 ENCOUNTER — Emergency Department (HOSPITAL_COMMUNITY)
Admission: EM | Admit: 2024-05-30 | Discharge: 2024-05-30 | Disposition: A | Discharge status: Home / Self Care | Attending: Emergency Medicine | Admitting: Emergency Medicine

## 2024-05-30 DIAGNOSIS — R918 Other nonspecific abnormal finding of lung field: Secondary | ICD-10-CM

## 2024-05-30 DIAGNOSIS — N186 End stage renal disease: Secondary | ICD-10-CM | POA: Insufficient documentation

## 2024-05-30 DIAGNOSIS — R252 Cramp and spasm: Secondary | ICD-10-CM

## 2024-05-30 DIAGNOSIS — R079 Chest pain, unspecified: Secondary | ICD-10-CM | POA: Diagnosis not present

## 2024-05-30 DIAGNOSIS — I3139 Other pericardial effusion (noninflammatory): Secondary | ICD-10-CM

## 2024-05-30 DIAGNOSIS — R110 Nausea: Secondary | ICD-10-CM

## 2024-05-30 DIAGNOSIS — Z794 Long term (current) use of insulin: Secondary | ICD-10-CM | POA: Insufficient documentation

## 2024-05-30 DIAGNOSIS — M2550 Pain in unspecified joint: Secondary | ICD-10-CM | POA: Insufficient documentation

## 2024-05-30 DIAGNOSIS — R52 Pain, unspecified: Secondary | ICD-10-CM

## 2024-05-30 DIAGNOSIS — Z940 Kidney transplant status: Secondary | ICD-10-CM

## 2024-05-30 LAB — COMPREHENSIVE METABOLIC PANEL WITH GFR
ALT: 26 U/L (ref 0–44)
AST: 18 U/L (ref 15–41)
Albumin: 3.5 g/dL (ref 3.5–5.0)
Alkaline Phosphatase: 55 U/L (ref 38–126)
Anion gap: 10 (ref 5–15)
BUN: 19 mg/dL (ref 6–20)
CO2: 25 mmol/L (ref 22–32)
Calcium: 7.6 mg/dL — ABNORMAL LOW (ref 8.9–10.3)
Chloride: 101 mmol/L (ref 98–111)
Creatinine, Ser: 1.38 mg/dL — ABNORMAL HIGH (ref 0.44–1.00)
GFR, Estimated: 47 mL/min — ABNORMAL LOW (ref >60)
Glucose, Bld: 124 mg/dL — ABNORMAL HIGH (ref 70–99)
Potassium: 4 mmol/L (ref 3.5–5.1)
Sodium: 136 mmol/L (ref 135–145)
Total Bilirubin: 1.3 mg/dL — ABNORMAL HIGH (ref 0.0–1.2)
Total Protein: 6.3 g/dL — ABNORMAL LOW (ref 6.5–8.1)

## 2024-05-30 LAB — CBC WITH DIFFERENTIAL/PLATELET
Abs Immature Granulocytes: 0.05 10*3/uL (ref 0.00–0.07)
Basophils Absolute: 0 10*3/uL (ref 0.0–0.1)
Basophils Relative: 0 %
Eosinophils Absolute: 0 10*3/uL (ref 0.0–0.5)
Eosinophils Relative: 0 %
HCT: 36.4 % (ref 36.0–46.0)
Hemoglobin: 11.5 g/dL — ABNORMAL LOW (ref 12.0–15.0)
Immature Granulocytes: 0 %
Lymphocytes Relative: 11 %
Lymphs Abs: 1.4 10*3/uL (ref 0.7–4.0)
MCH: 26.9 pg (ref 26.0–34.0)
MCHC: 31.6 g/dL (ref 30.0–36.0)
MCV: 85.2 fL (ref 80.0–100.0)
Monocytes Absolute: 1.2 10*3/uL — ABNORMAL HIGH (ref 0.1–1.0)
Monocytes Relative: 9 %
Neutro Abs: 10.9 10*3/uL — ABNORMAL HIGH (ref 1.7–7.7)
Neutrophils Relative %: 80 %
Platelets: 186 10*3/uL (ref 150–400)
RBC: 4.27 MIL/uL (ref 3.87–5.11)
RDW: 14.2 % (ref 11.5–15.5)
WBC: 13.6 10*3/uL — ABNORMAL HIGH (ref 4.0–10.5)
nRBC: 0 % (ref 0.0–0.2)

## 2024-05-30 LAB — CK: Total CK: 53 U/L (ref 38–234)

## 2024-05-30 LAB — TROPONIN I (HIGH SENSITIVITY): Troponin I (High Sensitivity): 8 ng/L (ref <18)

## 2024-05-30 MED ORDER — SODIUM CHLORIDE 0.9 % IV SOLN
1.0000 g | Freq: Once | INTRAVENOUS | Status: AC
  Administered 2024-05-30: 1 g via INTRAVENOUS
  Filled 2024-05-30: qty 10

## 2024-05-30 MED ORDER — FENTANYL CITRATE (PF) 100 MCG/2ML IJ SOLN
100.0000 ug | Freq: Once | INTRAMUSCULAR | Status: AC
  Administered 2024-05-30: 100 ug via INTRAVENOUS
  Filled 2024-05-30: qty 2

## 2024-05-30 MED ORDER — MORPHINE SULFATE (PF) 2 MG/ML IV SOLN
2.0000 mg | Freq: Once | INTRAVENOUS | Status: AC
  Administered 2024-05-30: 2 mg via INTRAVENOUS
  Filled 2024-05-30: qty 1

## 2024-05-30 MED ORDER — OXYCODONE-ACETAMINOPHEN 5-325 MG PO TABS
1.0000 | ORAL_TABLET | Freq: Once | ORAL | Status: AC
  Administered 2024-05-30: 1 via ORAL
  Filled 2024-05-30: qty 1

## 2024-05-30 MED ORDER — LACTATED RINGERS IV BOLUS
1000.0000 mL | Freq: Once | INTRAVENOUS | Status: AC
  Administered 2024-05-30: 1000 mL via INTRAVENOUS

## 2024-05-30 MED ORDER — CEFDINIR 300 MG PO CAPS: 300.0000 mg | ORAL_CAPSULE | Freq: Two times a day (BID) | ORAL | 0 refills | Status: AC

## 2024-05-30 MED ORDER — SODIUM CHLORIDE 0.9 % IV SOLN
500.0000 mg | Freq: Once | INTRAVENOUS | Status: AC
  Administered 2024-05-30: 500 mg via INTRAVENOUS
  Filled 2024-05-30: qty 5

## 2024-05-30 MED ORDER — AZITHROMYCIN 250 MG PO TABS: 250.0000 mg | ORAL_TABLET | Freq: Every day | ORAL | 0 refills | Status: AC

## 2024-05-30 MED ORDER — CALCIUM CARBONATE 1250 (500 CA) MG PO TABS
1.0000 | ORAL_TABLET | Freq: Once | ORAL | Status: AC
  Administered 2024-05-30: 1250 mg via ORAL
  Filled 2024-05-30: qty 1

## 2024-05-30 NOTE — ED Provider Notes (Signed)
 [name] AT [facility] Provider Note   CSN: [id number] Arrival date & time: 05/30/24  0546     History  Chief Complaint  Patient presents with   Generalized Pain    [name] is a 48 y.o. female.  48 yo F w/ h/o ESRD s/p transplant on prograf  and steroids, fibromyalgia, IBS who presents to the ED with diffuse joint pain, chest pain, bilateral calf pain associated spasms. Thinks it is could be a fibromyalgia flare however her fibromyalgia is usually mostly in her torso. No h/o STD's. No fevers. No urinary symptoms.   After seeing her xr, I asked her if she was sure she hadn't been coughing or dyspnea and she states she has a rattly feeling in her chest but no cough.         Home Medications Prior to Admission medications   Medication Sig Start Date End Date Taking? Authorizing Provider  acetaminophen  (TYLENOL  8 HOUR) 650 MG CR tablet Take 1 tablet (650 mg total) by mouth every 8 (eight) hours. 05/08/18   Nanavati, Ankit, MD  acyclovir ointment (ZOVIRAX) 5 % Apply 1 application topically 3 (three) times daily as needed (outbreak).     [provider]  albuterol  (PROAIR  HFA) 108 (90 Base) MCG/ACT inhaler Inhale 2 puffs into the lungs every 4 (four) hours as needed for wheezing or shortness of breath. 04/11/17   Rochester Chuck, MD  ALPRAZolam  (XANAX ) 0.5 MG tablet Take 0.5 mg by mouth 2 (two) times daily.     [provider]  amitriptyline (ELAVIL) 10 MG tablet Take 10 mg by mouth at bedtime.    [provider]  amoxicillin -clavulanate (AUGMENTIN ) 875-125 MG tablet Take 1 tablet by mouth 2 (two) times daily. One po bid x 7 days 07/04/18   Jerilynn Montenegro, MD  ARIPiprazole (ABILIFY) 10 MG tablet Take 10 mg by mouth daily. 10/03/23   [provider]  baclofen (LIORESAL) 10 MG tablet Take 10 mg by mouth 2 (two) times daily.    [provider]  buprenorphine (BUTRANS) 15 MCG/HR Place 1 patch  onto the skin once a week.    [provider]  busPIRone  (BUSPAR ) 15 MG tablet Take 15 mg by mouth 3 (three) times daily.    [provider]  calcitRIOL  (ROCALTROL ) 0.5 MCG capsule Take 2 capsules by mouth 2 (two) times daily. 02/16/08   [provider]  calcium  carbonate (TUMS) 500 MG chewable tablet Chew 1-2 tablets by mouth daily as needed for heartburn.  04/04/12   [provider]  cephALEXin  (KEFLEX ) 500 MG capsule Take 1 capsule (500 mg total) by mouth 3 (three) times daily. 12/10/18   Zammit, Joseph, MD  cyanocobalamin (VITAMIN B12) 1000 MCG/ML injection Inject 1,000 mcg into the muscle every 14 (fourteen) days. 12/09/19   [provider]  cyclobenzaprine  (FLEXERIL ) 10 MG tablet Take 10 mg by mouth 2 (two) times daily as needed for muscle spasms.  05/28/17   [provider]  dicyclomine  (BENTYL ) 10 MG capsule Take 20 mg by mouth 3 (three) times daily as needed for spasms.     [provider]  doxepin (SINEQUAN) 10 MG capsule Take 10 mg by mouth at bedtime. 04/03/18 04/03/19  [provider]  doxycycline  (VIBRAMYCIN ) 100 MG capsule Take 1 capsule (100 mg total) by mouth 2 (two) times daily. 07/07/18   [name], MD  EPINEPHrine  0.3 mg/0.3 mL IJ SOAJ injection Use as directed, if administer call  911 02/08/17   [provider]  famotidine  (PEPCID ) 40 MG tablet Take 40 mg by mouth 2 (two) times daily.    [provider]  FLUoxetine  (PROZAC ) 40 MG capsule Take 40 mg by mouth 2 (two) times daily. 07/23/17   [provider]  gabapentin  (NEURONTIN ) 100 MG capsule Take 1 capsule (100 mg total) by mouth 2 (two) times daily. 11/26/20   Avegno, Komlanvi S, FNP  hydrocortisone  2.5 % ointment Apply 1 application topically every 6 (six) hours as needed (for irritation).     [provider]  hydrOXYzine  (ATARAX /VISTARIL ) 50 MG tablet Take 50 mg by mouth at bedtime.     [provider]   hydrOXYzine  (VISTARIL ) 50 MG capsule Take 1 capsule by mouth at bedtime. 11/27/19   [provider]  levocetirizine (XYZAL) 5 MG tablet Take 5 mg by mouth every evening.    [provider]  loratadine  (CLARITIN ) 10 MG tablet Take 10 mg by mouth every morning.     [provider]  magic mouthwash SOLN Take 5 mLs by mouth every 4 (four) hours as needed for mouth pain.  04/12/16   [provider]  metFORMIN (GLUCOPHAGE-XR) 500 MG 24 hr tablet Take 1,000 mg by mouth 2 (two) times daily.    [provider]  metoCLOPramide  (REGLAN ) 10 MG tablet Take 10 mg by mouth 3 (three) times daily as needed for nausea.    [provider]  montelukast (SINGULAIR) 10 MG tablet Take 5 mg by mouth at bedtime.    [provider]  mycophenolate  (MYFORTIC ) 180 MG EC tablet Take 180 mg by mouth 2 (two) times daily.    [provider]  nitrofurantoin , macrocrystal-monohydrate, (MACROBID ) 100 MG capsule Take 1 capsule (100 mg total) by mouth 2 (two) times daily. 06/09/21   Corine Dice, MD  omeprazole (PRILOSEC) 40 MG capsule Take 40 mg by mouth 2 (two) times daily.     [provider]  ondansetron  (ZOFRAN ) 8 MG tablet Take by mouth every 8 (eight) hours as needed for nausea or vomiting.    [provider]  ondansetron  (ZOFRAN -ODT) 4 MG disintegrating tablet Take 4 mg by mouth every 8 (eight) hours.    [provider]  Oxycodone  HCl 10 MG TABS Take 1 tablet (10 mg total) by mouth 3 (three) times daily as needed for up to 10 doses. 11/24/23   Ninetta Basket, MD  OZEMPIC, 1 MG/DOSE, 4 MG/3ML SOPN Inject 1 mg into the skin once a week.    [provider]  predniSONE  (DELTASONE ) 5 MG tablet Take 5 mg by mouth daily with breakfast. 06/27/19   [provider]  promethazine  (PHENERGAN ) 25 MG tablet Take 1 tablet (25 mg total) by mouth every 6 (six) hours as needed for nausea. 06/11/17   [name], MD   rosuvastatin (CRESTOR) 5 MG tablet Take 5 mg by mouth at bedtime.    [provider]  sucralfate  (CARAFATE ) 1 g tablet Take 1 g by mouth 4 (four) times daily -  with meals and at bedtime.    [provider]  tacrolimus  (PROGRAF ) 1 MG capsule Take 3 mg by mouth 2 (two) times daily.    [provider]  tacrolimus  (PROGRAF ) 1 MG capsule Take 1 mg by mouth 2 (two) times daily.    [provider]  tiZANidine (ZANAFLEX) 4 MG tablet Take 4 mg by mouth 2 (two) times daily as needed. 07/05/23   [provider]  valACYclovir (VALTREX) 500 MG tablet Take 500-1,000 mg by mouth See admin instructions. 2 on the first day then 1 tab day 2-5    [provider]      Allergies    Barium-containing compounds, Hydromorphone , Iodinated contrast media, Shellfish allergy, Vancomycin , Aspirin , Tape, Infed [iron dextran], Sulfa antibiotics, and Iopamidol    Review of Systems   Review of Systems  Physical Exam Updated Vital Signs BP 112/63   Pulse 85   Temp 99.4 F (37.4 C) (Oral)   Resp 18   Ht 5\' 2"  (1.575 m)   Wt 64.4 kg   LMP 01/30/2012   SpO2 96%   BMI 25.97 kg/m  Physical Exam Vitals and nursing note reviewed.  Constitutional:      Appearance: She is well-developed.  HENT:     Head: Normocephalic and atraumatic.     Nose: No congestion or rhinorrhea.  Cardiovascular:     Rate and Rhythm: Normal rate and regular rhythm.     Heart sounds: No murmur heard. Pulmonary:     Effort: No respiratory distress.     Breath sounds: No stridor.  Abdominal:     General: There is no distension.  Musculoskeletal:        General: Normal range of motion.     Cervical back: Normal range of motion.  Skin:    General: Skin is warm and dry.  Neurological:     Mental Status: She is alert.     ED Results / Procedures / Treatments   Labs (all labs ordered are listed, but only abnormal results are displayed) Labs Reviewed  TACROLIMUS  LEVEL  CBC WITH  DIFFERENTIAL/PLATELET  COMPREHENSIVE METABOLIC PANEL WITH GFR  CK  TROPONIN I (HIGH SENSITIVITY)    EKG None  Radiology No results found.  Procedures Procedures    Medications Ordered in ED Medications  lactated ringers  bolus 1,000 mL (has no administration in time range)  fentaNYL  (SUBLIMAZE ) injection 100 mcg (has no administration in time range)    ED Course/ Medical Decision Making/ A&P                                 Medical Decision Making Amount and/or Complexity of Data Reviewed Labs: ordered. Radiology: ordered. ECG/medicine tests: ordered.  Risk OTC drugs. Prescription drug management.   Overall appears well. Will rule out ACS. Eval for rhabdo. Symptomatic treatment for now.   Nausea improving, pain improving. Fluids given.    Xr reading possible retrocardiac opacity, ct w/o contrast ordered to delineate. No real respiratory symptoms but if consolidation seen on ct would consider antibiotics. If this does end up beinga pneumonia, likely only needs one troponin.   Care transferred pending ct/labs/reevaluation but likely can be discharged.    Final Clinical Impression(s) / ED Diagnoses Final diagnoses:  None    Rx / DC Orders ED Discharge Orders     None         Elonzo Sopp, Reymundo Caulk, MD 05/30/24 2356

## 2024-05-30 NOTE — ED Triage Notes (Signed)
 Pt states she thinks she is having a "fibromyalgia flare".

## 2024-05-30 NOTE — Discharge Instructions (Addendum)
 It was our pleasure to provide your ER care today - we hope that you feel better.  Drink adequate fluids/stay well hydrated.  Take antibiotics as prescribed. Take acetaminophen  as need.   Follow up closely with pulmonary doctor in the coming week as planned.   From the lab tests, your calcium  level remains low - continue your calcium  supplementation/meds. For recent symptoms and imaging/lab results follow up closely with primary care doctor in  the next 1-2 weeks.  Also follow up with urologist as planned.   Return to ER if worse, new symptoms, high fevers, increased trouble breathing, weak/fainting, or other concern.  You were given pain meds in the ER - no driving for the next 8 hours.

## 2024-05-30 NOTE — ED Provider Notes (Signed)
 Signed out that ct pending, ?possible pna. .   Ct chest noted with ground glass type opacities. On review of prior charts, including ct 2019 and subsequent, note made of similar findings on prior imaging. Rare/occasional non prod cough.  Wbc mildly elev, t 99.4. will give abx, rocephin  and zithromax  iv.   Pt indicates feels improved from initial in terms of pain/body pain. No focal area of pain or sweling. Vital signs are normal. Patient is breathing comfortably. O2 sdats currently 97%. Pt appears stable for ED d/c.   Pt does indicate has close f/u with pulmonary already arranged in the coming week.   Return precautions provided.      Guadalupe Lee, MD 05/30/24 [phone]

## 2024-05-30 NOTE — ED Notes (Signed)
 Patient transported to CT

## 2024-06-02 LAB — TACROLIMUS LEVEL: Tacrolimus (FK506) - LabCorp: 3.4 ng/mL — ABNORMAL LOW (ref 5.0–20.0)

## 2024-10-11 ENCOUNTER — Emergency Department (HOSPITAL_COMMUNITY)
Admission: EM | Admit: 2024-10-11 | Discharge: 2024-10-11 | Disposition: A | Discharge status: Home / Self Care | Attending: Emergency Medicine | Admitting: Emergency Medicine

## 2024-10-11 ENCOUNTER — Encounter (HOSPITAL_COMMUNITY): Payer: Self-pay | Admitting: *Deleted

## 2024-10-11 ENCOUNTER — Other Ambulatory Visit: Payer: Self-pay

## 2024-10-11 ENCOUNTER — Emergency Department (HOSPITAL_COMMUNITY)

## 2024-10-11 DIAGNOSIS — R0602 Shortness of breath: Secondary | ICD-10-CM | POA: Diagnosis present

## 2024-10-11 DIAGNOSIS — Z940 Kidney transplant status: Secondary | ICD-10-CM | POA: Diagnosis not present

## 2024-10-11 DIAGNOSIS — F419 Anxiety disorder, unspecified: Secondary | ICD-10-CM | POA: Diagnosis not present

## 2024-10-11 DIAGNOSIS — N186 End stage renal disease: Secondary | ICD-10-CM | POA: Insufficient documentation

## 2024-10-11 DIAGNOSIS — R531 Weakness: Secondary | ICD-10-CM | POA: Diagnosis not present

## 2024-10-11 DIAGNOSIS — N183 Chronic kidney disease, stage 3 unspecified: Secondary | ICD-10-CM

## 2024-10-11 LAB — URINALYSIS, ROUTINE W REFLEX MICROSCOPIC
Bilirubin Urine: NEGATIVE
Glucose, UA: NEGATIVE mg/dL
Hgb urine dipstick: NEGATIVE
Ketones, ur: NEGATIVE mg/dL
Leukocytes,Ua: NEGATIVE
Nitrite: NEGATIVE
Protein, ur: NEGATIVE mg/dL
Specific Gravity, Urine: 1.008 (ref 1.005–1.030)
pH: 9 — ABNORMAL HIGH (ref 5.0–8.0)

## 2024-10-11 LAB — COMPREHENSIVE METABOLIC PANEL WITH GFR
ALT: 6 U/L (ref 0–44)
AST: 19 U/L (ref 15–41)
Albumin: 4.2 g/dL (ref 3.5–5.0)
Alkaline Phosphatase: 50 U/L (ref 38–126)
Anion gap: 16 — ABNORMAL HIGH (ref 5–15)
BUN: 17 mg/dL (ref 6–20)
CO2: 18 mmol/L — ABNORMAL LOW (ref 22–32)
Calcium: 8.6 mg/dL — ABNORMAL LOW (ref 8.9–10.3)
Chloride: 102 mmol/L (ref 98–111)
Creatinine, Ser: 1.82 mg/dL — ABNORMAL HIGH (ref 0.44–1.00)
GFR, Estimated: 33 mL/min — ABNORMAL LOW (ref >60)
Glucose, Bld: 106 mg/dL — ABNORMAL HIGH (ref 70–99)
Potassium: 3.8 mmol/L (ref 3.5–5.1)
Sodium: 136 mmol/L (ref 135–145)
Total Bilirubin: 1.2 mg/dL (ref 0.0–1.2)
Total Protein: 6.7 g/dL (ref 6.5–8.1)

## 2024-10-11 LAB — CBC WITH DIFFERENTIAL/PLATELET
Abs Immature Granulocytes: 0.01 K/uL (ref 0.00–0.07)
Basophils Absolute: 0 K/uL (ref 0.0–0.1)
Basophils Relative: 1 %
Eosinophils Absolute: 0 K/uL (ref 0.0–0.5)
Eosinophils Relative: 0 %
HCT: 37.5 % (ref 36.0–46.0)
Hemoglobin: 12 g/dL (ref 12.0–15.0)
Immature Granulocytes: 0 %
Lymphocytes Relative: 14 %
Lymphs Abs: 0.6 K/uL — ABNORMAL LOW (ref 0.7–4.0)
MCH: 26.5 pg (ref 26.0–34.0)
MCHC: 32 g/dL (ref 30.0–36.0)
MCV: 83 fL (ref 80.0–100.0)
Monocytes Absolute: 0.3 K/uL (ref 0.1–1.0)
Monocytes Relative: 7 %
Neutro Abs: 3.4 K/uL (ref 1.7–7.7)
Neutrophils Relative %: 78 %
Platelets: 210 K/uL (ref 150–400)
RBC: 4.52 MIL/uL (ref 3.87–5.11)
RDW: 13.3 % (ref 11.5–15.5)
WBC: 4.3 K/uL (ref 4.0–10.5)
nRBC: 0 % (ref 0.0–0.2)

## 2024-10-11 LAB — MAGNESIUM: Magnesium: 1.9 mg/dL (ref 1.7–2.4)

## 2024-10-11 LAB — RESP PANEL BY RT-PCR (RSV, FLU A&B, COVID)  RVPGX2
Influenza A by PCR: NEGATIVE
Influenza B by PCR: NEGATIVE
Resp Syncytial Virus by PCR: NEGATIVE
SARS Coronavirus 2 by RT PCR: NEGATIVE

## 2024-10-11 LAB — D-DIMER, QUANTITATIVE: D-Dimer, Quant: 0.38 ug{FEU}/mL (ref 0.00–0.50)

## 2024-10-11 MED ORDER — LORAZEPAM 2 MG/ML IJ SOLN
1.0000 mg | Freq: Once | INTRAMUSCULAR | Status: AC
  Administered 2024-10-11: 1 mg via INTRAVENOUS
  Filled 2024-10-11: qty 1

## 2024-10-11 MED ORDER — DIAZEPAM 5 MG PO TABS
5.0000 mg | ORAL_TABLET | Freq: Once | ORAL | Status: AC
  Administered 2024-10-11: 5 mg via ORAL
  Filled 2024-10-11: qty 1

## 2024-10-11 MED ORDER — SODIUM CHLORIDE 0.9 % IV BOLUS
1000.0000 mL | Freq: Once | INTRAVENOUS | Status: AC
  Administered 2024-10-11: 1000 mL via INTRAVENOUS

## 2024-10-11 NOTE — ED Provider Notes (Signed)
 [place] EMERGENCY DEPARTMENT AT [facility] Provider Note   CSN: [id number] Arrival date & time: 10/11/24  1512     Patient presents with: Shortness of Breath   [name] is a 48 y.o. female presenting to the ED with shortness of breath, fatigue, numbness, and tingling that began Friday afternoon. The symptoms started gradually and have been worsening since Friday. The patient describes the symptoms as feeling weak, pain in all extremities but more severe in her left lower extremity, and like she has the chills.  Patient denies any nausea, vomiting, fever, cough, sore throat, headache, chest pain. She states she has a prior medical history of anxiety and takes Xanax  for same, states she has not taken her Xanax  today, so she is unsure if this is related to her anxiety or if she has something underlying going on.  Patient believes she may be dehydrated.  Relevant medical history, including end-stage renal disease and kidney transplant.  No recent travel. No sick contacts. The patient's social history is notable for no tobacco, alcohol, drug use.      Shortness of Breath Associated symptoms: ear pain (Right ear pain.)   Associated symptoms: no abdominal pain, no chest pain, no cough, no fever (Patient states she is unsure if she has had a fever because she has not checked at home.), no headaches, no neck pain, no sore throat, no vomiting and no wheezing        Prior to Admission medications   Medication Sig Start Date End Date Taking? Authorizing Provider  acetaminophen  (TYLENOL  8 HOUR) 650 MG CR tablet Take 1 tablet (650 mg total) by mouth every 8 (eight) hours. 05/08/18   Nanavati, Ankit, MD  acyclovir ointment (ZOVIRAX) 5 % Apply 1 application topically 3 (three) times daily as needed (outbreak).     [provider]  albuterol  (PROAIR  HFA) 108 (90 Base) MCG/ACT inhaler Inhale 2 puffs into the lungs every 4 (four) hours as needed for wheezing or shortness  of breath. 04/11/17   Iva Marty Saltness, MD  ALPRAZolam  (XANAX ) 0.5 MG tablet Take 0.5 mg by mouth 2 (two) times daily.     [provider]  amitriptyline (ELAVIL) 10 MG tablet Take 10 mg by mouth at bedtime.    [provider]  ARIPiprazole (ABILIFY) 10 MG tablet Take 10 mg by mouth daily. 10/03/23   [provider]  baclofen (LIORESAL) 10 MG tablet Take 10 mg by mouth 2 (two) times daily.    [provider]  buprenorphine (BUTRANS) 15 MCG/HR Place 1 patch onto the skin once a week.    [provider]  busPIRone  (BUSPAR ) 15 MG tablet Take 15 mg by mouth 3 (three) times daily.    [provider]  calcitRIOL  (ROCALTROL ) 0.5 MCG capsule Take 2 capsules by mouth 2 (two) times daily. 02/16/08   [provider]  calcium  carbonate (TUMS) 500 MG chewable tablet Chew 1-2 tablets by mouth daily as needed for heartburn.  04/04/12   [provider]  cefdinir  (OMNICEF ) 300 MG capsule Take 1 capsule (300 mg total) by mouth 2 (two) times daily. 05/30/24   Steinl, Kevin, MD  cyanocobalamin (VITAMIN B12) 1000 MCG/ML injection Inject 1,000 mcg into the muscle every 14 (fourteen) days. 12/09/19   [provider]  cyclobenzaprine  (FLEXERIL ) 10 MG tablet Take 10 mg by mouth 2 (two) times daily as needed for muscle spasms.  05/28/17   [provider]  dicyclomine  (BENTYL ) 10 MG  capsule Take 20 mg by mouth 3 (three) times daily as needed for spasms.     [provider]  doxepin (SINEQUAN) 10 MG capsule Take 10 mg by mouth at bedtime. 04/03/18 04/03/19  [provider]  EPINEPHrine  0.3 mg/0.3 mL IJ SOAJ injection Use as directed, if administer call 911 02/08/17   [provider]  famotidine  (PEPCID ) 40 MG tablet Take 40 mg by mouth 2 (two) times daily.    [provider]  FLUoxetine  (PROZAC ) 40 MG capsule Take 40 mg by mouth 2 (two) times daily. 07/23/17   [provider]  gabapentin  (NEURONTIN )  100 MG capsule Take 1 capsule (100 mg total) by mouth 2 (two) times daily. 11/26/20   Avegno, Komlanvi S, FNP  hydrocortisone  2.5 % ointment Apply 1 application topically every 6 (six) hours as needed (for irritation).     [provider]  hydrOXYzine  (ATARAX /VISTARIL ) 50 MG tablet Take 50 mg by mouth at bedtime.     [provider]  hydrOXYzine  (VISTARIL ) 50 MG capsule Take 1 capsule by mouth at bedtime. 11/27/19   [provider]  levocetirizine (XYZAL) 5 MG tablet Take 5 mg by mouth every evening.    [provider]  loratadine  (CLARITIN ) 10 MG tablet Take 10 mg by mouth every morning.     [provider]  magic mouthwash SOLN Take 5 mLs by mouth every 4 (four) hours as needed for mouth pain.  04/12/16   [provider]  metFORMIN (GLUCOPHAGE-XR) 500 MG 24 hr tablet Take 1,000 mg by mouth 2 (two) times daily.    [provider]  metoCLOPramide  (REGLAN ) 10 MG tablet Take 10 mg by mouth 3 (three) times daily as needed for nausea.    [provider]  montelukast (SINGULAIR) 10 MG tablet Take 5 mg by mouth at bedtime.    [provider]  mycophenolate  (MYFORTIC ) 180 MG EC tablet Take 180 mg by mouth 2 (two) times daily.    [provider]  nitrofurantoin , macrocrystal-monohydrate, (MACROBID ) 100 MG capsule Take 1 capsule (100 mg total) by mouth 2 (two) times daily. 06/09/21   Blaise Aleene KIDD, MD  omeprazole (PRILOSEC) 40 MG capsule Take 40 mg by mouth 2 (two) times daily.     [provider]  ondansetron  (ZOFRAN ) 8 MG tablet Take by mouth every 8 (eight) hours as needed for nausea or vomiting.    [provider]  ondansetron  (ZOFRAN -ODT) 4 MG disintegrating tablet Take 4 mg by mouth every 8 (eight) hours.    [provider]  Oxycodone  HCl 10 MG TABS Take 1 tablet (10 mg total) by mouth 3 (three) times daily as needed for up to 10 doses. 11/24/23   Yolande Lamar BROCKS, MD  OZEMPIC, 1  MG/DOSE, 4 MG/3ML SOPN Inject 1 mg into the skin once a week.    [provider]  predniSONE  (DELTASONE ) 5 MG tablet Take 5 mg by mouth daily with breakfast. 06/27/19   [provider]  promethazine  (PHENERGAN ) 25 MG tablet Take 1 tablet (25 mg total) by mouth every 6 (six) hours as needed for nausea. 06/11/17   Patsey Lot, MD  rosuvastatin (CRESTOR) 5 MG tablet Take 5 mg by mouth at bedtime.    [provider]  sucralfate  (CARAFATE ) 1 g tablet Take 1 g by mouth 4 (four) times daily -  with meals and at bedtime.    [provider]  tacrolimus  (PROGRAF ) 1 MG capsule Take 3 mg by  mouth 2 (two) times daily.    [provider]  tacrolimus  (PROGRAF ) 1 MG capsule Take 1 mg by mouth 2 (two) times daily.    [provider]  tiZANidine (ZANAFLEX) 4 MG tablet Take 4 mg by mouth 2 (two) times daily as needed. 07/05/23   [provider]  valACYclovir (VALTREX) 500 MG tablet Take 500-1,000 mg by mouth See admin instructions. 2 on the first day then 1 tab day 2-5    [provider]    Allergies: Barium-containing compounds, Hydromorphone , Iodinated contrast media, Shellfish allergy, Vancomycin , Aspirin , Tape, Infed [iron dextran], Sulfa antibiotics, and Iopamidol    Review of Systems  Constitutional:  Positive for chills. Negative for fever (Patient states she is unsure if she has had a fever because she has not checked at home.).  HENT:  Positive for ear pain (Right ear pain.). Negative for ear discharge, rhinorrhea, sinus pressure, sinus pain, sneezing, sore throat and trouble swallowing.   Eyes:  Negative for visual disturbance.  Respiratory:  Positive for shortness of breath. Negative for cough, chest tightness and wheezing.   Cardiovascular:  Negative for chest pain and leg swelling.  Gastrointestinal:  Negative for abdominal pain, blood in stool, diarrhea, nausea and vomiting.  Genitourinary:  Positive for hematuria (This morning  in her first void she noted that her urine was brown in color). Negative for decreased urine volume, difficulty urinating, dysuria, flank pain and urgency.  Musculoskeletal:  Positive for arthralgias (Body aches associated with chills.). Negative for neck pain and neck stiffness.  Skin:  Negative for color change.  Neurological:  Positive for weakness. Negative for dizziness, light-headedness, numbness and headaches.  Psychiatric/Behavioral:  The patient is nervous/anxious.     Updated Vital Signs BP (!) 105/57   Pulse 75   Temp 98.4 F (36.9 C) (Oral)   Resp 12   Wt 64.4 kg   LMP 01/30/2012   SpO2 96%   BMI 25.97 kg/m   Physical Exam Vitals and nursing note reviewed.  Constitutional:      General: She is not in acute distress.    Appearance: Normal appearance. She is not ill-appearing.     Comments: Patient appears anxious during exam with anxiety induced tremors.  HENT:     Head: Normocephalic and atraumatic.     Right Ear: Hearing, tympanic membrane and ear canal normal.     Left Ear: Hearing, tympanic membrane and ear canal normal.     Ears:     Comments: Right ear assessed and no erythema or effusion noted.    Mouth/Throat:     Mouth: Mucous membranes are moist.     Pharynx: Oropharynx is clear. No posterior oropharyngeal erythema or postnasal drip.  Eyes:     General: Vision grossly intact. Gaze aligned appropriately.     Extraocular Movements: Extraocular movements intact.     Conjunctiva/sclera: Conjunctivae normal.     Pupils: Pupils are equal, round, and reactive to light.  Cardiovascular:     Rate and Rhythm: Normal rate and regular rhythm.     Pulses: Normal pulses.          Radial pulses are 2+ on the right side.     Comments: No edema noted in bilateral lower extremities. Pulmonary:     Effort: Pulmonary effort is normal. No respiratory distress.     Breath sounds: Normal breath sounds.  Abdominal:     General: Abdomen is flat.     Palpations: Abdomen is  soft.  Tenderness: There is no abdominal tenderness.  Musculoskeletal:        General: Normal range of motion.     Cervical back: Normal range of motion. No rigidity. No pain with movement or spinous process tenderness.     Right knee: Normal.     Left knee: Normal.     Right lower leg: Normal. No tenderness. No edema.     Left lower leg: Normal. No tenderness. No edema.     Comments: Patient denies pain on palpation of bilateral lower extremities.  Skin:    General: Skin is warm and dry.     Capillary Refill: Capillary refill takes less than 2 seconds.  Neurological:     General: No focal deficit present.     Mental Status: She is alert. Mental status is at baseline.     GCS: GCS eye subscore is 4. GCS verbal subscore is 5. GCS motor subscore is 6.     Cranial Nerves: Cranial nerves 2-12 are intact.     Sensory: Sensation is intact.     Motor: Motor function is intact.  Psychiatric:        Mood and Affect: Mood normal.        Behavior: Behavior is cooperative.     (all labs ordered are listed, but only abnormal results are displayed) Labs Reviewed  COMPREHENSIVE METABOLIC PANEL WITH GFR - Abnormal; Notable for the following components:      Result Value   CO2 18 (*)    Glucose, Bld 106 (*)    Creatinine, Ser 1.82 (*)    Calcium  8.6 (*)    GFR, Estimated 33 (*)    Anion gap 16 (*)    All other components within normal limits  URINALYSIS, ROUTINE W REFLEX MICROSCOPIC - Abnormal; Notable for the following components:   pH 9.0 (*)    All other components within normal limits  CBC WITH DIFFERENTIAL/PLATELET - Abnormal; Notable for the following components:   Lymphs Abs 0.6 (*)    All other components within normal limits  RESP PANEL BY RT-PCR (RSV, FLU A&B, COVID)  RVPGX2  D-DIMER, QUANTITATIVE  MAGNESIUM     EKG: EKG Interpretation Date/Time:  Sunday October 11 2024 16:40:24 EDT Ventricular Rate:  72 PR Interval:    QRS Duration:  190 QT Interval:  508 QTC  Calculation: 560 R Axis:   -9  Text Interpretation: Atrial fibrillation QT prolonged since last tracing no significant change Confirmed by Cleotilde Rogue ([id number]) on 10/11/2024 4:53:39 PM  Radiology: [facility] Chest 2 View Result Date: 10/11/2024 EXAM: 2 VIEW(S) XRAY OF THE CHEST 10/11/2024 04:22:00 PM COMPARISON: 05/30/2024 CLINICAL HISTORY: shortness of breath. Per chart: BIB husband, dropped off. Anxious, pressured speech, hyperventilating. Endorses sob, numbess and tingling, and feeling tired. Talkative, long winded, rambling speech about how she feels. 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 ; Gradually progressively worse through the weekend. Admits to panicking and overthinking. Sx fluctuate. FINDINGS: LUNGS AND PLEURA: No focal pulmonary opacity. No pulmonary edema. No pleural effusion. No pneumothorax. HEART AND MEDIASTINUM: Cardiomegaly. Calcified aorta. BONES AND SOFT TISSUES: Surgical clips in right supraclavicular region. Surgical clips in upper abdomen. No acute osseous abnormality. IMPRESSION: 1. No acute cardiopulmonary process. Electronically signed by: Norman Gatlin MD 10/11/2024 04:52 PM EDT RP Workstation: [id number]     Procedures   Medications Ordered in the ED  sodium chloride  0.9 % bolus 1,000 mL (0 mLs Intravenous Stopped 10/11/24 2017)  diazepam (VALIUM) tablet 5 mg (5 mg  Oral Given 10/11/24 1753)  LORazepam  (ATIVAN ) injection 1 mg (1 mg Intravenous Given 10/11/24 1844)                                    Medical Decision Making Amount and/or Complexity of Data Reviewed Labs: ordered. Radiology: ordered.  Risk Prescription drug management.   Patient presents to the ED for concern of multiple complaints including shortness of breath, numbness and tingling in lower extremities, fatigue, and anxiousness.This involves an extensive number of treatment options, and is a complaint that carries with it a high risk of complications and morbidity.    The  differential diagnosis includes: DVT PE UTI  Viral illness  Co morbidities that complicate the patient evaluation: Chronic kidney disease Anxiety Chronic Pain  Additional history obtained:  Additional history obtained from Outside Medical Records and Past Admission   External records from outside source obtained and reviewed including medical history, surgical history, allergies, medications.  The patient was a reliable historian, providing a clear, detailed, and consistent account of the presenting symptoms and relevant medical history. The information was obtained directly from the patient and statements were documented in the patient's own words when possible. No discrepancies were noted between the history provided and available collateral sources.     Lab Tests: I ordered, and personally interpreted labs.   The pertinent results include:  Creatinine 1.82 Viral respiratory panel negative Magnesium  WNL CBC WNL D-dimer WNL  Imaging Studies ordered: I ordered imaging studies including: DG chest 2 view I independently visualized and interpreted imaging which showed: No acute cardiopulmonary process I agree with the radiologist interpretation  Cardiac Monitoring: The patient was maintained on a cardiac monitor.  I personally viewed and interpreted the cardiac monitored which showed an underlying rhythm of: Sinus rhythm  Medicines ordered and prescription drug management: I ordered medications: Diazepam 5 mg for anxiety Lorazepam  1 mg for panic attack Sodium chloride  0.9% bolus 1000 mL for increased creatinine Reevaluation of the patient after these medicines showed that the patient improved I have reviewed the patients home medicines and have made adjustments as needed  Test Considered: Diagnostic testing was considered based on the patient's presenting symptoms, risk factors, and initial clinical assessment.  If in the negative D-dimer testing performed CT pulmonary  angiography was deemed not necessary. Shared decision making was employed to discuss the benefits and risks of diagnostic tests, including potential for risks and benefits. The approach to diagnostic testing prioritized exclusion of life-threatening conditions  Problem List / ED Course: Problem List: Shortness of breath Weakness Anxiety Emergency Department Course: The patient presented with shortness of breath, fatigue, numbness, and tingling that began Friday afternoon. The symptoms started gradually and have been worsening since Friday. The patient describes the symptoms as feeling weak, pain in all extremities but more severe in her left lower extremity, and like she has the chills.  Patient denies any nausea, vomiting, fever, cough, sore throat, headache, chest pain. She states she has a prior medical history of anxiety and takes Xanax  for same, states she has not taken her Xanax  today, so she is unsure if this is related to her anxiety or if she has something underlying going on.  Patient believes she may be dehydrated. Initial assessment included history, physical exam, and review of prior medical records. ECG was negative for acute ischemia. Chest X-ray was performed to rule out other causes; no acute findings.  CMP,  CBC, D-dimer were evaluated given patient's complaint.  D-dimer was within normal limits.  CMP showed increase creatinine, was increased from patient's last reading of 1.32 -she has a history of renal disease.  Urinalysis was negative for UTI.  Pittore panel was negative.  Visit today patient appeared extremely anxious and was given 5 mg Valium which appeared to give patient relief.  Later on in the visit the patient had a panic attack after ultrasound IV was placed, 1 mg lorazepam  was given IV with patient relief.  IV fluids given for increased creatinine.  Given the patient history, physical exam, and diagnostic tests the PE and DVT were ruled out.  Given the negative urinalysis  makes UTI unlikely.  The negative respiratory panel makes viral etiology unlikely.  Given the patient's history kidney disease, increased creatinine was treated with 1000 cc fluid.  Vital signs were stable for duration of visit.  Nursing noted an increased respiratory rate a few times during the visit however patient appeared in no respiratory distress, appeared to be anxiety induced.  Patient had no difficulty speaking in between sentences.  Given the patient history, physical exam, negative diagnostic testing plan to proceed with discharge for generalized weakness with associated anxiety.   Reevaluation: After the interventions noted above, I reevaluated the patient and found that they have :improved  Social Determinants of Health: Patient screened for adverse SDOH domains per ED protocol.  Interventions: No adverse SDOH identified requiring intervention at this time. Social work consult not indicated.   Dispostion: After consideration of the diagnostic results and the patients response to treatment, I feel that the patent would benefit from discharge home with close follow-up with PCP at her appointment tomorrow. Response to ED treatment: Improved. No acute deterioration noted during ED stay.   Disposition Plan: The patient is medically stable for discharge from the Emergency Department at this time. Vital signs are within normal limits, and the patient is alert, oriented, and in no acute distress. Diagnostic evaluation and therapeutic interventions have been completed with no findings necessitating hospital admission or further emergent workup. The treatment plan, diagnostic results, and clinical impression were discussed with the patient. The patient demonstrated understanding of the discharge diagnosis, medication regimen, and follow-up plan. Strict return precautions were reviewed, including instructions to return to the Emergency Department or contact emergency medical services ([id number]) for any  worsening or new symptoms such as chest pain, shortness of breath, syncope, uncontrolled pain, persistent vomiting, fever, bleeding, or neurologic changes. The patient was encouraged to follow up with their primary care provider and/or specialty services as directed. The patient was discharged home in stable condition, ambulatory with steady gait, and with all personal belongings in their possession.  Communication:     Patient was informed of disposition decision and rationale. Questions addressed.       Final diagnoses:  Shortness of breath  Weakness  Anxiety  Stage 3 chronic kidney disease, unspecified whether stage 3a or 3b CKD [facility])    ED Discharge Orders     None          Willma Duwaine CROME, [place] 10/11/24 2312    Cleotilde Rogue, MD 10/12/24 [phone]

## 2024-10-11 NOTE — ED Triage Notes (Addendum)
 BIB husband, dropped off. Anxious, pressured speech, hyperventilating. Endorses sob, numbess and tingling, and feeling tired. Talkative, long winded, rambling speech about how she feels. 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 Admits to panicking and overthinking. Sx fluctuate. No relief with xanax . Unable to wait til she can see PCP.

## 2024-10-11 NOTE — ED Notes (Signed)
 Pt ambulated to the bathroom with spouse

## 2024-10-11 NOTE — ED Notes (Signed)
[facility]PA Provider at bedside. 

## 2024-10-11 NOTE — ED Notes (Signed)
 Pt has an estrogen patch on as well as a buprenorphine patch on. Pt states that they are both due to be changed today (Sunday)

## 2024-10-11 NOTE — ED Notes (Signed)
 Attempted to capture EKG, Pt rigid and shaking states is as relaxed as possible Pt refused respiratory panel swab unless she can swab herself -  pt states she took a home test as well

## 2024-10-11 NOTE — Discharge Instructions (Addendum)
 Thank you for visiting the Emergency Department today. It was a pleasure to be part of your healthcare team. Your test results showed no acute abnormality.  Please continue to take your home medications as directed.  If you have any questions about your medicines, please call your pharmacy or healthcare provider. At home, rest, hydrate, and resume normal diet.  It is important to watch for warning signs such as worsening pain, fever, trouble breathing. If any of these happen, return to the Emergency Department or call 911. Please follow up with your primary care provider tomorrow at your scheduled appointment.  Thank you for trusting us  with your health.

## 2024-12-18 ENCOUNTER — Other Ambulatory Visit: Payer: Self-pay | Admitting: Nephrology

## 2024-12-18 DIAGNOSIS — C649 Malignant neoplasm of unspecified kidney, except renal pelvis: Secondary | ICD-10-CM

## 2024-12-22 ENCOUNTER — Ambulatory Visit (HOSPITAL_COMMUNITY)
Admission: RE | Admit: 2024-12-22 | Discharge: 2024-12-22 | Disposition: A | Discharge status: Home / Self Care | Source: Ambulatory Visit | Attending: Nephrology | Admitting: Nephrology

## 2024-12-22 DIAGNOSIS — I3139 Other pericardial effusion (noninflammatory): Secondary | ICD-10-CM | POA: Insufficient documentation

## 2024-12-22 DIAGNOSIS — Z940 Kidney transplant status: Secondary | ICD-10-CM | POA: Diagnosis not present

## 2024-12-22 DIAGNOSIS — Z905 Acquired absence of kidney: Secondary | ICD-10-CM | POA: Insufficient documentation

## 2024-12-22 DIAGNOSIS — C641 Malignant neoplasm of right kidney, except renal pelvis: Secondary | ICD-10-CM | POA: Diagnosis not present

## 2024-12-22 DIAGNOSIS — C649 Malignant neoplasm of unspecified kidney, except renal pelvis: Secondary | ICD-10-CM | POA: Diagnosis present

## 2024-12-22 DIAGNOSIS — I700 Atherosclerosis of aorta: Secondary | ICD-10-CM | POA: Diagnosis not present
